# Patient Record
Sex: Female | Born: 1970 | Race: Black or African American | Hispanic: No | Marital: Married | Smoking: Never smoker
Health system: Southern US, Community
[De-identification: ages and names within clinical notes are randomized; demographics above are authoritative.]

## PROBLEM LIST (undated history)

## (undated) DIAGNOSIS — I10 Essential (primary) hypertension: Secondary | ICD-10-CM

## (undated) DIAGNOSIS — T7840XA Allergy, unspecified, initial encounter: Secondary | ICD-10-CM

## (undated) DIAGNOSIS — F419 Anxiety disorder, unspecified: Secondary | ICD-10-CM

## (undated) DIAGNOSIS — R7303 Prediabetes: Secondary | ICD-10-CM

## (undated) DIAGNOSIS — IMO0001 Reserved for inherently not codable concepts without codable children: Secondary | ICD-10-CM

## (undated) DIAGNOSIS — J189 Pneumonia, unspecified organism: Secondary | ICD-10-CM

## (undated) DIAGNOSIS — R519 Headache, unspecified: Secondary | ICD-10-CM

## (undated) DIAGNOSIS — E785 Hyperlipidemia, unspecified: Secondary | ICD-10-CM

## (undated) DIAGNOSIS — L509 Urticaria, unspecified: Secondary | ICD-10-CM

## (undated) DIAGNOSIS — J45909 Unspecified asthma, uncomplicated: Secondary | ICD-10-CM

## (undated) DIAGNOSIS — C50919 Malignant neoplasm of unspecified site of unspecified female breast: Secondary | ICD-10-CM

## (undated) DIAGNOSIS — N921 Excessive and frequent menstruation with irregular cycle: Secondary | ICD-10-CM

## (undated) DIAGNOSIS — Z8042 Family history of malignant neoplasm of prostate: Secondary | ICD-10-CM

## (undated) DIAGNOSIS — R51 Headache: Secondary | ICD-10-CM

## (undated) DIAGNOSIS — D259 Leiomyoma of uterus, unspecified: Secondary | ICD-10-CM

## (undated) HISTORY — PX: BREAST BIOPSY: SHX20

## (undated) HISTORY — PX: UTERINE FIBROID SURGERY: SHX826

## (undated) HISTORY — DX: Urticaria, unspecified: L50.9

## (undated) HISTORY — DX: Family history of malignant neoplasm of prostate: Z80.42

## (undated) HISTORY — DX: Excessive and frequent menstruation with irregular cycle: N92.1

## (undated) HISTORY — DX: Leiomyoma of uterus, unspecified: D25.9

## (undated) HISTORY — DX: Malignant neoplasm of unspecified site of unspecified female breast: C50.919

## (undated) HISTORY — PX: DILATION AND CURETTAGE OF UTERUS: SHX78

## (undated) HISTORY — DX: Allergy, unspecified, initial encounter: T78.40XA

## (undated) HISTORY — PX: GANGLION CYST EXCISION: SHX1691

---

## 2005-03-25 ENCOUNTER — Emergency Department: Payer: Self-pay | Admitting: Emergency Medicine

## 2005-05-20 ENCOUNTER — Emergency Department: Payer: Self-pay | Admitting: Emergency Medicine

## 2008-10-22 ENCOUNTER — Emergency Department: Payer: Self-pay | Admitting: Emergency Medicine

## 2011-01-22 ENCOUNTER — Ambulatory Visit: Payer: Self-pay | Admitting: Obstetrics and Gynecology

## 2011-01-25 ENCOUNTER — Ambulatory Visit: Payer: Self-pay | Admitting: Obstetrics and Gynecology

## 2011-02-06 ENCOUNTER — Ambulatory Visit: Payer: Self-pay | Admitting: Family Medicine

## 2011-05-02 ENCOUNTER — Ambulatory Visit: Payer: Self-pay | Admitting: Obstetrics and Gynecology

## 2011-05-09 ENCOUNTER — Inpatient Hospital Stay: Payer: Self-pay | Admitting: Obstetrics and Gynecology

## 2011-05-09 LAB — POTASSIUM: Potassium: 3.9 mmol/L (ref 3.5–5.1)

## 2011-05-10 LAB — CBC
HCT: 26.1 % — ABNORMAL LOW (ref 35.0–47.0)
MCHC: 32.2 g/dL (ref 32.0–36.0)
RBC: 3.58 10*6/uL — ABNORMAL LOW (ref 3.80–5.20)
RDW: 19.4 % — ABNORMAL HIGH (ref 11.5–14.5)

## 2011-05-10 LAB — BASIC METABOLIC PANEL
BUN: 7 mg/dL (ref 7–18)
Calcium, Total: 8.2 mg/dL — ABNORMAL LOW (ref 8.5–10.1)
Co2: 27 mmol/L (ref 21–32)
EGFR (African American): >60
EGFR (Non-African Amer.): >60
Glucose: 123 mg/dL — ABNORMAL HIGH (ref 65–99)
Potassium: 3.3 mmol/L — ABNORMAL LOW (ref 3.5–5.1)
Sodium: 138 mmol/L (ref 136–145)

## 2011-05-11 LAB — COMPREHENSIVE METABOLIC PANEL
Albumin: 2.8 g/dL — ABNORMAL LOW (ref 3.4–5.0)
Anion Gap: 8 (ref 7–16)
BUN: 7 mg/dL (ref 7–18)
Calcium, Total: 8.2 mg/dL — ABNORMAL LOW (ref 8.5–10.1)
Co2: 27 mmol/L (ref 21–32)
EGFR (African American): >60
EGFR (Non-African Amer.): >60
Glucose: 98 mg/dL (ref 65–99)
Osmolality: 281 (ref 275–301)
Potassium: 3.7 mmol/L (ref 3.5–5.1)
SGOT(AST): 29 U/L (ref 15–37)
Sodium: 142 mmol/L (ref 136–145)

## 2011-05-11 LAB — CBC WITH DIFFERENTIAL/PLATELET
Basophil #: 0 10*3/uL (ref 0.0–0.1)
Basophil %: 0 %
Eosinophil #: 0.2 10*3/uL (ref 0.0–0.7)
Eosinophil %: 1.6 %
HGB: 7.8 g/dL — ABNORMAL LOW (ref 12.0–16.0)
Lymphocyte #: 2.1 10*3/uL (ref 1.0–3.6)
MCH: 23.7 pg — ABNORMAL LOW (ref 26.0–34.0)
MCV: 74 fL — ABNORMAL LOW (ref 80–100)
Monocyte #: 1 10*3/uL — ABNORMAL HIGH (ref 0.0–0.7)
Monocyte %: 6.9 %
Neutrophil %: 77.5 %
Platelet: 332 10*3/uL (ref 150–440)
RBC: 3.27 10*6/uL — ABNORMAL LOW (ref 3.80–5.20)
WBC: 14.7 10*3/uL — ABNORMAL HIGH (ref 3.6–11.0)

## 2011-05-11 LAB — CBC
HGB: 7.8 g/dL — ABNORMAL LOW (ref 12.0–16.0)
MCV: 73 fL — ABNORMAL LOW (ref 80–100)
Platelet: 320 10*3/uL (ref 150–440)
RBC: 3.29 10*6/uL — ABNORMAL LOW (ref 3.80–5.20)
WBC: 14 10*3/uL — ABNORMAL HIGH (ref 3.6–11.0)

## 2011-05-12 LAB — URINALYSIS, COMPLETE
Bilirubin,UR: NEGATIVE
Blood: NEGATIVE
Glucose,UR: NEGATIVE mg/dL (ref 0–75)
Ketone: NEGATIVE
Nitrite: NEGATIVE
RBC,UR: 1 /HPF (ref 0–5)
Specific Gravity: 1.006 (ref 1.003–1.030)
WBC UR: 1 /HPF (ref 0–5)

## 2011-05-12 LAB — CBC
HCT: 23.8 % — ABNORMAL LOW (ref 35.0–47.0)
MCHC: 32 g/dL (ref 32.0–36.0)
MCV: 73 fL — ABNORMAL LOW (ref 80–100)
Platelet: 337 10*3/uL (ref 150–440)
RBC: 3.26 10*6/uL — ABNORMAL LOW (ref 3.80–5.20)
RDW: 20 % — ABNORMAL HIGH (ref 11.5–14.5)
WBC: 12 10*3/uL — ABNORMAL HIGH (ref 3.6–11.0)

## 2011-05-12 LAB — BASIC METABOLIC PANEL
Anion Gap: 8 (ref 7–16)
Calcium, Total: 8.6 mg/dL (ref 8.5–10.1)
Chloride: 107 mmol/L (ref 98–107)
Co2: 27 mmol/L (ref 21–32)
Creatinine: 0.86 mg/dL (ref 0.60–1.30)
EGFR (African American): >60
Sodium: 142 mmol/L (ref 136–145)

## 2011-05-13 LAB — URINE CULTURE

## 2011-05-14 LAB — PATHOLOGY REPORT

## 2012-03-07 ENCOUNTER — Emergency Department: Payer: Self-pay | Admitting: Emergency Medicine

## 2012-11-19 ENCOUNTER — Ambulatory Visit: Payer: Self-pay | Admitting: Family Medicine

## 2014-08-24 ENCOUNTER — Emergency Department
Admit: 2014-08-24 | Disposition: A | Discharge status: Home / Self Care | Payer: Self-pay | Admitting: Emergency Medicine

## 2014-08-24 LAB — COMPREHENSIVE METABOLIC PANEL
Albumin: 4.3 g/dL
Alkaline Phosphatase: 42 U/L
Anion Gap: 11 (ref 7–16)
BILIRUBIN TOTAL: 0.5 mg/dL
BUN: 16 mg/dL
CREATININE: 0.99 mg/dL
Calcium, Total: 9.8 mg/dL
Chloride: 101 mmol/L
Co2: 27 mmol/L
EGFR (African American): >60
Glucose: 100 mg/dL — ABNORMAL HIGH
POTASSIUM: 3.3 mmol/L — AB
SGOT(AST): 25 U/L
SGPT (ALT): 21 U/L
SODIUM: 139 mmol/L
Total Protein: 8.2 g/dL — ABNORMAL HIGH

## 2014-08-24 LAB — CBC
HCT: 39.3 % (ref 35.0–47.0)
HGB: 12.9 g/dL (ref 12.0–16.0)
MCH: 27.3 pg (ref 26.0–34.0)
MCHC: 32.8 g/dL (ref 32.0–36.0)
MCV: 83 fL (ref 80–100)
Platelet: 425 10*3/uL (ref 150–440)
RBC: 4.73 10*6/uL (ref 3.80–5.20)
RDW: 14.6 % — ABNORMAL HIGH (ref 11.5–14.5)
WBC: 12.6 10*3/uL — ABNORMAL HIGH (ref 3.6–11.0)

## 2014-08-24 LAB — URINALYSIS, COMPLETE
Bacteria: NONE SEEN
Bilirubin,UR: NEGATIVE
GLUCOSE, UR: NEGATIVE mg/dL (ref 0–75)
Ketone: NEGATIVE
LEUKOCYTE ESTERASE: NEGATIVE
NITRITE: NEGATIVE
Ph: 5 (ref 4.5–8.0)
Protein: NEGATIVE
Specific Gravity: 1.009 (ref 1.003–1.030)

## 2014-08-24 LAB — PREGNANCY, URINE: Pregnancy Test, Urine: NEGATIVE m[IU]/mL

## 2014-08-24 LAB — LIPASE, BLOOD: Lipase: 29 U/L

## 2014-08-28 NOTE — Op Note (Signed)
PATIENT NAME:  Tracey Huff, Tracey Huff MR#:  [id number] DATE OF BIRTH:  04-Jun-1970  DATE OF PROCEDURE:  05/09/2011  PREOPERATIVE DIAGNOSIS:  1) Large leiomyomatous uterus extending to the level of the umbilicus.  2) Desires to preserve uterus for fertility  POSTOPERATIVE DIAGNOSIS:  1) Large leiomyomatous uterus extending to the level of the umbilicus.  2) Desires to preserve uterus for fertility   PROCEDURE: Abdominal myomectomy of multiple fibroids (six total).   SURGEON: Will Bonnet, MD  ASSISTANT SURGEON: Dr. Barnett Applebaum   ANESTHESIA: General.   ESTIMATED BLOOD LOSS: 100 mL.   OPERATIVE FLUIDS: 1800 mL crystalloid.   COMPLICATIONS: None.  FINDINGS:  1. Large fibroid uterus extending just to the level of the umbilicus with multiple fibroids, the largest in the posterior fundal region.  2. Multiple smaller fibroids that are scattered throughout the myometrium.  3. Largest fibroid did communicate with the endometrial cavity. 4.  Normal appearing fallopian tubes and ovaries.   SPECIMENS: Total of six leiomyomas in varying sizes.   CONDITION: Stable.   INDICATIONS: Tracey Huff is a 44 year old female gravida 2, para 2-0-0-2 who presented to my office with symptoms of heavy menstrual bleeding along with pelvic pressure. She also desired to retain her uterus for purposes of fertility. After investigation at least one or two very large fibroids are in the fundal region of her uterus. She was offered an opportunity for myomectomy with uterine sparing. She also was warned about the possibility of the need for hysterectomy given the size and location of the largest fibroid. With this in mind she agreed to go to the Operating Room for myomectomy, possible hysterectomy. She was pretreated with Depo Lupron for 3 months prior to this surgery in an attempt to decrease the size of the fibroids.   DETAILS OF THE PROCEDURE: The patient was met in the preoperative area and the details of the  surgery were reviewed with the patient. From there she proceeded to the Operating Room where she was placed under general anesthesia which was found to be adequate. She was placed in dorsal supine lithotomy position in the yellow-fin stirrups then prepped and draped in normal sterile fashion. After a Time-Out was called a Foley catheter was placed in her bladder.   Attention was turned to her abdomen where a Pfannenstiel incision was made through her existing scar and incision was carried down through the various layers until the abdominal cavity was entered without incident. No adhesions were noted at the site of entry. The abdomen was examined and the fundus of the uterus was able to be grasped and delivered through the incision. The uterus was then injected with a diluted solution of 20 units of vasopressin diluted in 100 mL of saline. A total of approximately 20 mL of this solution was injected into the myometrium of the uterus; 10 mL anteriorly, 10 mL posteriorly. Next, attention was turned to the fundus of the uterus where an anteroposterior type incision was made using the Bovie and carried down to the level of the fibroid. This incision was extended anteriorly and posteriorly and a plane was developed between the fibroid and the myometrium until eventually the fibroid was totally shelled out from the myometrium. Along the way multiple other smaller fibroids were noted and shelled out in a similar fashion. There was one right lateral posterior fibroid that was approximately 3 to 4 cm in size which was removed in a similar fashion. Noted was that entry into the endometrial cavity occured with  removal of the largest myoma; this was verified by using a uterine sound which was passed into the cavity through the cervix. There was a defect that had to be made in the endometrium in addition to the one from the fibroid. This was closed using a 3-0 Vicryl with a figure of eight as it was a small defect,  approximately 1 cm. The large defect left by the largest fibroid was then closed. Initially the endometrium was reapproximated using 3-0 Vicryl in a running locked fashion. The myometrium was closed in two layers using a #2 quill suture. The second half of the myometrial layer was closed using the second half of the #2 quill suture. Next a running suture of 2-0 Vicryl was used to reapproximate the edges of the serosa, the actual edges of the serosa were reapproximated using 3-0 Vicryl using a running baseball stitch. The same procedure was carried out where the smaller fibroid was removed from the right posterolateral side. Hemostasis was noted. Next an adhesion barrier was placed over the incision site. The rest of the uterus and the pelvis and the abdomen was explored with the above-noted findings. Hemostasis was noted. The peritoneum was closed in a running fashion using 2-0 Vicryl.   The On-Q pump system was then placed by introducing the introducer approximately 4 cm superior to the level of the incision approximately 1 cm lateral to the midline on each side. The introducers were advanced through the skin down below the fascia where they were just superficial to the rectus muscle. Next, the catheters themselves were passed through the introducers in the specified fashion and the sheaths were removed. Next, the fascia was closed using #0 Maxon in a running fashion. The skin was closed with staples. The catheters for the On-Q pump where then affixed to the skin at the entry site using Dermabond then covered with a 4 x 4 and Steri-Strips and Tegaderm. Both of the catheters were then injected with 0.5% Marcaine for a total of 10 mL. The On-Q pain pump was then attached and will provide a rate of 2 mL/h in each line for a total of 4 mL/h.   The patient tolerated the procedure. Sponge, LAP and needle counts were correct x2. Patient was given 2 grams of cefazolin prior to skin incision. For VTE prophylaxis patient  had SCDs on from before incision throughout the entire case.     ____________________________ Will Bonnet, MD sdj:cms D: 05/09/2011 16:42:17 ET T: 05/09/2011 17:11:07 ET JOB#: [id number]  cc: Will Bonnet, MD, <Dictator> Will Bonnet MD ELECTRONICALLY SIGNED 05/22/2011 23:10

## 2014-11-09 ENCOUNTER — Encounter
Admission: RE | Admit: 2014-11-09 | Discharge: 2014-11-09 | Disposition: A | Discharge status: Home / Self Care | Payer: 59 | Source: Ambulatory Visit | Attending: Obstetrics and Gynecology | Admitting: Obstetrics and Gynecology

## 2014-11-09 DIAGNOSIS — Z01812 Encounter for preprocedural laboratory examination: Secondary | ICD-10-CM | POA: Diagnosis present

## 2014-11-09 DIAGNOSIS — Z01810 Encounter for preprocedural cardiovascular examination: Secondary | ICD-10-CM | POA: Insufficient documentation

## 2014-11-09 HISTORY — DX: Reserved for inherently not codable concepts without codable children: IMO0001

## 2014-11-09 HISTORY — DX: Headache, unspecified: R51.9

## 2014-11-09 HISTORY — DX: Unspecified asthma, uncomplicated: J45.909

## 2014-11-09 HISTORY — DX: Headache: R51

## 2014-11-09 HISTORY — DX: Essential (primary) hypertension: I10

## 2014-11-09 LAB — COMPREHENSIVE METABOLIC PANEL
ALBUMIN: 4.1 g/dL (ref 3.5–5.0)
ALK PHOS: 41 U/L (ref 38–126)
ALT: 18 U/L (ref 14–54)
AST: 23 U/L (ref 15–41)
Anion gap: 8 (ref 5–15)
BILIRUBIN TOTAL: 0.4 mg/dL (ref 0.3–1.2)
BUN: 14 mg/dL (ref 6–20)
CHLORIDE: 100 mmol/L — AB (ref 101–111)
CO2: 26 mmol/L (ref 22–32)
Calcium: 9.2 mg/dL (ref 8.9–10.3)
Creatinine, Ser: 0.88 mg/dL (ref 0.44–1.00)
GFR calc Af Amer: >60 mL/min (ref >60)
GFR calc non Af Amer: >60 mL/min (ref >60)
Glucose, Bld: 96 mg/dL (ref 65–99)
Potassium: 3.6 mmol/L (ref 3.5–5.1)
Sodium: 134 mmol/L — ABNORMAL LOW (ref 135–145)
Total Protein: 8.1 g/dL (ref 6.5–8.1)

## 2014-11-09 LAB — TYPE AND SCREEN
ABO/RH(D): A POS
Antibody Screen: NEGATIVE

## 2014-11-09 LAB — CBC
HCT: 38.7 % (ref 35.0–47.0)
Hemoglobin: 12.6 g/dL (ref 12.0–16.0)
MCH: 27.5 pg (ref 26.0–34.0)
MCHC: 32.5 g/dL (ref 32.0–36.0)
MCV: 84.5 fL (ref 80.0–100.0)
PLATELETS: 395 10*3/uL (ref 150–440)
RBC: 4.58 MIL/uL (ref 3.80–5.20)
RDW: 14.2 % (ref 11.5–14.5)
WBC: 9.5 10*3/uL (ref 3.6–11.0)

## 2014-11-09 LAB — ABO/RH: ABO/RH(D): A POS

## 2014-11-09 NOTE — Patient Instructions (Signed)
  Your procedure is scheduled on: November 15, 2014 (Tuesday) Report to Day Surgery. To find out your arrival time please call [phone] between 1PM - 3PM on November 14 2014 (Monday).  Remember: Instructions that are not followed completely may result in serious medical risk, up to and including death, or upon the discretion of your surgeon and anesthesiologist your surgery may need to be rescheduled.    __x__ 1. Do not eat food or drink liquids after midnight. No gum chewing or hard candies.     __x__ 2. No Alcohol for 24 hours before or after surgery.   ____ 3. Bring all medications with you on the day of surgery if instructed.    __x__ 4. Notify your doctor if there is any change in your medical condition     (cold, fever, infections).     Do not wear jewelry, make-up, hairpins, clips or nail polish.  Do not wear lotions, powders, or perfumes. You may wear deodorant.  Do not shave 48 hours prior to surgery. Men may shave face and neck.  Do not bring valuables to the hospital.    [facility] is not responsible for any belongings or valuables.               Contacts, dentures or bridgework may not be worn into surgery.  Leave your suitcase in the car. After surgery it may be brought to your room.  For patients admitted to the hospital, discharge time is determined by your                treatment team.   Patients discharged the day of surgery will not be allowed to drive home.   Please read over the following fact sheets that you were given:   Surgical Site Infection Prevention   __x__ Take these medicines the morning of surgery with A SIP OF WATER:    1.      ____ Fleet Enema (as directed)   __x__ Use CHG Soap as directed  __x__ Use inhalers on the day of surgery (Qvar and  Albuterol inhaler  and bring to hospital) ____ Stop metformin 2 days prior to surgery    ____ Take 1/2 of usual insulin dose the night before surgery and none on the morning of surgery.   __x__ Stop  Coumadin/Plavix/aspirin on (Tylenol ok to take for pain)  ____ Stop Anti-inflammatories on   ____ Stop supplements until after surgery.    ____ Bring C-Pap to the hospital.

## 2014-11-15 ENCOUNTER — Encounter: Payer: Self-pay | Admitting: *Deleted

## 2014-11-15 ENCOUNTER — Encounter
Admission: RE | Disposition: A | Discharge status: Home / Self Care | Payer: Self-pay | Source: Ambulatory Visit | Attending: Obstetrics and Gynecology

## 2014-11-15 ENCOUNTER — Inpatient Hospital Stay: Payer: 59 | Admitting: Anesthesiology

## 2014-11-15 ENCOUNTER — Inpatient Hospital Stay
Admission: RE | Admit: 2014-11-15 | Discharge: 2014-11-17 | DRG: 743 | Disposition: A | Discharge status: Home / Self Care | Payer: 59 | Source: Ambulatory Visit | Attending: Obstetrics and Gynecology | Admitting: Obstetrics and Gynecology

## 2014-11-15 DIAGNOSIS — D259 Leiomyoma of uterus, unspecified: Secondary | ICD-10-CM

## 2014-11-15 DIAGNOSIS — Z7951 Long term (current) use of inhaled steroids: Secondary | ICD-10-CM

## 2014-11-15 DIAGNOSIS — N920 Excessive and frequent menstruation with regular cycle: Secondary | ICD-10-CM | POA: Diagnosis present

## 2014-11-15 DIAGNOSIS — I10 Essential (primary) hypertension: Secondary | ICD-10-CM | POA: Diagnosis present

## 2014-11-15 DIAGNOSIS — J45909 Unspecified asthma, uncomplicated: Secondary | ICD-10-CM | POA: Diagnosis present

## 2014-11-15 DIAGNOSIS — Z79899 Other long term (current) drug therapy: Secondary | ICD-10-CM | POA: Diagnosis not present

## 2014-11-15 DIAGNOSIS — N921 Excessive and frequent menstruation with irregular cycle: Secondary | ICD-10-CM

## 2014-11-15 DIAGNOSIS — Z90710 Acquired absence of both cervix and uterus: Secondary | ICD-10-CM | POA: Diagnosis present

## 2014-11-15 HISTORY — DX: Leiomyoma of uterus, unspecified: D25.9

## 2014-11-15 HISTORY — PX: CYSTOSCOPY: SHX5120

## 2014-11-15 HISTORY — PX: ABDOMINAL HYSTERECTOMY: SHX81

## 2014-11-15 HISTORY — DX: Excessive and frequent menstruation with irregular cycle: N92.1

## 2014-11-15 LAB — POCT PREGNANCY, URINE: Preg Test, Ur: NEGATIVE

## 2014-11-15 SURGERY — HYSTERECTOMY, ABDOMINAL
Anesthesia: General | Wound class: Clean Contaminated

## 2014-11-15 MED ORDER — ONDANSETRON HCL 4 MG/2ML IJ SOLN
4.0000 mg | Freq: Four times a day (QID) | INTRAMUSCULAR | Status: DC | PRN
  Administered 2014-11-15 – 2014-11-16 (×2): 4 mg via INTRAVENOUS
  Filled 2014-11-15 (×2): qty 2

## 2014-11-15 MED ORDER — BUPIVACAINE HCL (PF) 0.5 % IJ SOLN
INTRAMUSCULAR | Status: AC
  Filled 2014-11-15: qty 30

## 2014-11-15 MED ORDER — BUPIVACAINE HCL (PF) 0.5 % IJ SOLN
10.0000 mL | Freq: Once | INTRAMUSCULAR | Status: AC
  Administered 2014-11-15: 10 mL

## 2014-11-15 MED ORDER — PROPOFOL 10 MG/ML IV BOLUS
INTRAVENOUS | Status: DC | PRN
  Administered 2014-11-15: 170 mg via INTRAVENOUS

## 2014-11-15 MED ORDER — OXYCODONE HCL 5 MG PO TABS: 5.0000 mg | ORAL_TABLET | Freq: Once | ORAL | Status: DC | PRN

## 2014-11-15 MED ORDER — CEFAZOLIN SODIUM-DEXTROSE 2-3 GM-% IV SOLR
INTRAVENOUS | Status: AC
  Administered 2014-11-15: 2 g via INTRAVENOUS
  Filled 2014-11-15: qty 50

## 2014-11-15 MED ORDER — LIDOCAINE HCL (CARDIAC) 20 MG/ML IV SOLN
INTRAVENOUS | Status: DC | PRN
  Administered 2014-11-15: 60 mg via INTRAVENOUS

## 2014-11-15 MED ORDER — OXYCODONE HCL 5 MG PO TABS
10.0000 mg | ORAL_TABLET | Freq: Four times a day (QID) | ORAL | Status: DC | PRN
  Administered 2014-11-15 – 2014-11-16 (×2): 10 mg via ORAL
  Filled 2014-11-15 (×2): qty 2

## 2014-11-15 MED ORDER — ACETAMINOPHEN 500 MG PO TABS
1000.0000 mg | ORAL_TABLET | Freq: Four times a day (QID) | ORAL | Status: DC
  Administered 2014-11-15 – 2014-11-16 (×2): 1000 mg via ORAL
  Filled 2014-11-15 (×2): qty 2

## 2014-11-15 MED ORDER — LACTATED RINGERS IV SOLN
INTRAVENOUS | Status: DC
Start: 2014-11-15 — End: 2014-11-15
  Administered 2014-11-15: 125 mL/h via INTRAVENOUS
  Administered 2014-11-15: 15:00:00 via INTRAVENOUS

## 2014-11-15 MED ORDER — SUGAMMADEX SODIUM 200 MG/2ML IV SOLN
INTRAVENOUS | Status: DC | PRN
  Administered 2014-11-15: 150 mg via INTRAVENOUS

## 2014-11-15 MED ORDER — OXYCODONE HCL 5 MG PO TABS: 5.0000 mg | ORAL_TABLET | Freq: Four times a day (QID) | ORAL | Status: DC | PRN

## 2014-11-15 MED ORDER — OXYCODONE HCL 5 MG/5ML PO SOLN: 5.0000 mg | Freq: Once | ORAL | Status: DC | PRN

## 2014-11-15 MED ORDER — HYDROMORPHONE HCL 1 MG/ML IJ SOLN
1.0000 mg | INTRAMUSCULAR | Status: DC | PRN
  Administered 2014-11-15 (×2): 1 mg via INTRAVENOUS
  Filled 2014-11-15 (×2): qty 1

## 2014-11-15 MED ORDER — LACTATED RINGERS IV SOLN
INTRAVENOUS | Status: DC
  Administered 2014-11-15: 125 mL/h via INTRAVENOUS
  Administered 2014-11-16: 04:00:00 via INTRAVENOUS

## 2014-11-15 MED ORDER — SIMETHICONE 80 MG PO CHEW: 80.0000 mg | CHEWABLE_TABLET | Freq: Four times a day (QID) | ORAL | Status: DC | PRN

## 2014-11-15 MED ORDER — DEXAMETHASONE SODIUM PHOSPHATE 10 MG/ML IJ SOLN
INTRAMUSCULAR | Status: DC | PRN
  Administered 2014-11-15: 5 mg via INTRAVENOUS

## 2014-11-15 MED ORDER — FENTANYL CITRATE (PF) 100 MCG/2ML IJ SOLN
INTRAMUSCULAR | Status: AC
  Administered 2014-11-15: 25 ug via INTRAVENOUS
  Filled 2014-11-15: qty 2

## 2014-11-15 MED ORDER — FENTANYL CITRATE (PF) 100 MCG/2ML IJ SOLN
25.0000 ug | INTRAMUSCULAR | Status: AC | PRN
  Administered 2014-11-15 (×6): 25 ug via INTRAVENOUS

## 2014-11-15 MED ORDER — FENTANYL CITRATE (PF) 100 MCG/2ML IJ SOLN
INTRAMUSCULAR | Status: DC | PRN
Start: 2014-11-15 — End: 2014-11-15
  Administered 2014-11-15: 50 ug via INTRAVENOUS
  Administered 2014-11-15: 100 ug via INTRAVENOUS
  Administered 2014-11-15: 50 ug via INTRAVENOUS
  Administered 2014-11-15 (×2): 25 ug via INTRAVENOUS

## 2014-11-15 MED ORDER — ALBUTEROL SULFATE HFA 108 (90 BASE) MCG/ACT IN AERS: 1.0000 | INHALATION_SPRAY | Freq: Four times a day (QID) | RESPIRATORY_TRACT | Status: DC | PRN

## 2014-11-15 MED ORDER — BUPIVACAINE 0.25 % ON-Q PUMP DUAL CATH 400 ML: 400.0000 mL | INJECTION | Status: DC

## 2014-11-15 MED ORDER — MENTHOL 3 MG MT LOZG: 1.0000 | LOZENGE | OROMUCOSAL | Status: DC | PRN

## 2014-11-15 MED ORDER — PHENYLEPHRINE HCL 10 MG/ML IJ SOLN
INTRAMUSCULAR | Status: DC | PRN
  Administered 2014-11-15: 50 ug via INTRAVENOUS

## 2014-11-15 MED ORDER — CEFAZOLIN SODIUM-DEXTROSE 2-3 GM-% IV SOLR
2.0000 g | INTRAVENOUS | Status: AC
  Administered 2014-11-15: 2 g via INTRAVENOUS

## 2014-11-15 MED ORDER — DOCUSATE SODIUM 100 MG PO CAPS
100.0000 mg | ORAL_CAPSULE | Freq: Two times a day (BID) | ORAL | Status: DC
Start: 2014-11-15 — End: 2014-11-17
  Administered 2014-11-16 – 2014-11-17 (×4): 100 mg via ORAL
  Filled 2014-11-15 (×4): qty 1

## 2014-11-15 MED ORDER — FAMOTIDINE 20 MG PO TABS
20.0000 mg | ORAL_TABLET | Freq: Once | ORAL | Status: AC
  Administered 2014-11-15: 20 mg via ORAL

## 2014-11-15 MED ORDER — ALBUTEROL SULFATE (2.5 MG/3ML) 0.083% IN NEBU: 2.5000 mg | INHALATION_SOLUTION | Freq: Four times a day (QID) | RESPIRATORY_TRACT | Status: DC | PRN

## 2014-11-15 MED ORDER — MIDAZOLAM HCL 5 MG/5ML IJ SOLN
INTRAMUSCULAR | Status: DC | PRN
  Administered 2014-11-15: 2 mg via INTRAVENOUS

## 2014-11-15 MED ORDER — FAMOTIDINE 20 MG PO TABS
ORAL_TABLET | ORAL | Status: AC
  Administered 2014-11-15: 20 mg via ORAL
  Filled 2014-11-15: qty 1

## 2014-11-15 MED ORDER — ONDANSETRON HCL 4 MG PO TABS
4.0000 mg | ORAL_TABLET | Freq: Four times a day (QID) | ORAL | Status: DC | PRN
  Administered 2014-11-16 – 2014-11-17 (×2): 4 mg via ORAL
  Filled 2014-11-15 (×2): qty 1

## 2014-11-15 MED ORDER — IBUPROFEN 600 MG PO TABS
600.0000 mg | ORAL_TABLET | Freq: Four times a day (QID) | ORAL | Status: DC
  Administered 2014-11-15 – 2014-11-16 (×2): 600 mg via ORAL
  Filled 2014-11-15 (×2): qty 1

## 2014-11-15 MED ORDER — BUPIVACAINE 0.25 % ON-Q PUMP DUAL CATH 400 ML
INJECTION | Status: AC
  Filled 2014-11-15: qty 400

## 2014-11-15 MED ORDER — ONDANSETRON HCL 4 MG/2ML IJ SOLN
INTRAMUSCULAR | Status: DC | PRN
  Administered 2014-11-15: 4 mg via INTRAVENOUS

## 2014-11-15 MED ORDER — ROCURONIUM BROMIDE 100 MG/10ML IV SOLN
INTRAVENOUS | Status: DC | PRN
  Administered 2014-11-15 (×3): 10 mg via INTRAVENOUS
  Administered 2014-11-15: 30 mg via INTRAVENOUS

## 2014-11-15 SURGICAL SUPPLY — 51 items
BAG URO DRAIN 2000ML W/SPOUT (MISCELLANEOUS) ×2 IMPLANT
CANISTER SUCT 1200ML W/VALVE (MISCELLANEOUS) ×2 IMPLANT
CATH FOLEY 2WAY  5CC 16FR (CATHETERS) ×1
CATH KIT ON-Q SILVERSOAK 5IN (CATHETERS) ×4 IMPLANT
CATH ROBINSON RED A/P 16FR (CATHETERS) IMPLANT
CATH TRAY 16F METER LATEX (MISCELLANEOUS) ×2 IMPLANT
CATH URTH 16FR FL 2W BLN LF (CATHETERS) ×1 IMPLANT
DRAPE LAPAROTOMY 100X77 ABD (DRAPES) ×2 IMPLANT
DRAPE LAPAROTOMY TRNSV 106X77 (MISCELLANEOUS) ×2 IMPLANT
DRAPE LEGGINS SURG 28X43 STRL (DRAPES) ×2 IMPLANT
DRAPE UNDER BUTTOCK W/FLU (DRAPES) ×2 IMPLANT
DRAPE XRAY CASSETTE 23X24 (DRAPES) IMPLANT
DRSG TELFA 3X8 NADH (GAUZE/BANDAGES/DRESSINGS) ×2 IMPLANT
ELECT BLADE 6 FLAT ULTRCLN (ELECTRODE) IMPLANT
ELECT CAUTERY BLADE 6.4 (BLADE) ×2 IMPLANT
GAUZE SPONGE 4X4 12PLY STRL (GAUZE/BANDAGES/DRESSINGS) ×4 IMPLANT
GLOVE BIO SURGEON STRL SZ7 (GLOVE) ×4 IMPLANT
GLOVE BIO SURGEON STRL SZ8 (GLOVE) ×10 IMPLANT
GLOVE BIOGEL PI IND STRL 7.5 (GLOVE) ×5 IMPLANT
GLOVE BIOGEL PI INDICATOR 7.5 (GLOVE) ×5
GOWN STRL REUS W/ TWL LRG LVL3 (GOWN DISPOSABLE) ×4 IMPLANT
GOWN STRL REUS W/ TWL XL LVL3 (GOWN DISPOSABLE) ×1 IMPLANT
GOWN STRL REUS W/TWL LRG LVL3 (GOWN DISPOSABLE) ×4
GOWN STRL REUS W/TWL XL LVL3 (GOWN DISPOSABLE) ×1
JELLY LUB 2OZ STRL (MISCELLANEOUS) ×1
JELLY LUBE 2OZ STRL (MISCELLANEOUS) ×1 IMPLANT
KIT RM TURNOVER STRD PROC AR (KITS) ×2 IMPLANT
LIGASURE BLUNT 5MM 37CM (INSTRUMENTS) ×2 IMPLANT
NDL HPO THNWL 1X22GA REG BVL (NEEDLE) ×1 IMPLANT
NEEDLE SAFETY 22GX1 (NEEDLE) ×1
NS IRRIG 1000ML POUR BTL (IV SOLUTION) ×2 IMPLANT
PACK BASIN MAJOR ARMC (MISCELLANEOUS) ×2 IMPLANT
PACK CYSTO AR (MISCELLANEOUS) IMPLANT
PAD GROUND ADULT SPLIT (MISCELLANEOUS) ×2 IMPLANT
PAD OB MATERNITY 4.3X12.25 (PERSONAL CARE ITEMS) ×2 IMPLANT
PAD PREP 24X41 OB/GYN DISP (PERSONAL CARE ITEMS) ×2 IMPLANT
SET CYSTO W/LG BORE CLAMP LF (SET/KITS/TRAYS/PACK) IMPLANT
SPONGE LAP 18X18 5 PK (GAUZE/BANDAGES/DRESSINGS) ×4 IMPLANT
SPONGE XRAY 4X4 16PLY STRL (MISCELLANEOUS) ×2 IMPLANT
STAPLER SKIN PROX 35W (STAPLE) IMPLANT
STRIP CLOSURE SKIN 1/2X4 (GAUZE/BANDAGES/DRESSINGS) ×2 IMPLANT
SUT ETHIBOND 0 (SUTURE) IMPLANT
SUT MAXON ABS #0 GS21 30IN (SUTURE) ×4 IMPLANT
SUT VIC AB 0 CT1 27 (SUTURE) ×3
SUT VIC AB 0 CT1 27XCR 8 STRN (SUTURE) ×3 IMPLANT
SUT VIC AB 0 CT1 36 (SUTURE) ×2 IMPLANT
SUT VIC AB 4-0 PS2 18 (SUTURE) IMPLANT
SUT VICRYL PLUS ABS 0 54 (SUTURE) IMPLANT
SYR CONTROL 10ML (SYRINGE) ×2 IMPLANT
TRAY PREP VAG/GEN (MISCELLANEOUS) IMPLANT
WATER STERILE IRR 3000ML UROMA (IV SOLUTION) IMPLANT

## 2014-11-15 NOTE — H&P (Signed)
History and Physical Interval Note:  Tracey Huff  has presented today for surgery, with the diagnosis of ABNORMAL UTERINE BLEEDING and Uterine fibroids.  The various methods of treatment have been discussed with the patient and family. After consideration of risks, benefits and other options for treatment, the patient has consented to  Procedure(s): HYSTERECTOMY ABDOMINAL/BILATERAL SALPINGECTOMY  (N/A) CYSTOSCOPY (N/A) as a surgical intervention.  [name] history has been reviewed, patient examined, no change in status, stable for surgery.  I have reviewed the patient's chart and labs.  Questions were answered to the patient's satisfaction.    The patient does not take a beta blocker and one is not indicated for this surgery.  Will Bonnet, MD, [place] 11/15/2014 11:19 AM

## 2014-11-15 NOTE — Op Note (Signed)
Operative Report  Pre-Op Diagnosis:  1) menorrhagia with regular cycles  2) fibroid uterus  Post-Op Diagnosis:  1) menorrhagia with regular cycles 2) fibroid uterus  Procedures:  1. Total abdominal hysterectomy, bilateral salpingectomy  2. Cystoscopy   Primary Surgeon: Dr. Prentice Docker   Assistant Surgeon: Malachy Mood, M.D.  EBL: 200 ml   IVF: 1,100 mL   Urine output: 250 mL  Specimens: Uterus, bilateral tubes, cervix   Drains: Foley to gravity   Complications: None   Disposition: PACU   Condition: Stable   Findings:  1) uterus with several small fibroids noted 2) normal-appearing fallopian tubes bilaterally 3) normal-appearing ovaries 4) filmy adhesions of bladder to lower uterine segment, filmy adhesions of colon epiploica to the posterior lower uterine segment  Procedure Summary: The patient presented to the surgical admission suite, where consents were reviewed and confirmed. She was then taken to the operating room where a time-out performed. General endotracheal anesthesia was induced without difficulty. Patient was examined with findings as noted above.  The patient was then prepped and draped in the normal sterile fashion with her legs in the Yellow fin stirrups. . A Pfannenstiel skin incision was made 2cm above the pubic symphysis with a scalpel and carried through to the underlying layer of fascia. The fascia was scored in the midline and the incision extended laterally. The rectus muscles were separated in the midline and peritoneum identified and entered bluntly. The peritoneal incision was extended with electrocautery and gentle traction. The pelvis was inspected and the above findings noted. The round ligaments were suture ligated and transected bilaterally with 0.0 vicryl . The anterior and posterior leaves of the broad ligament were entered and a bladder flap was created with the electrocautery. The uteters were identified bilaterally. A window was then  created with the Bovie below the right uteroovarian, which was transected and doubly suture ligated with 0.0 vicryl. This was repeated on the left side. The bladder flap was further developed with sharp dissection. The Balfour with extender retractor was then assembled and the bowel packed away and retractors placed. The self-retaining retractor was placed in order to obtain better deep-pelvis visualization. The uterine vessels were skeletonized and curved clamps used to clamp the uterine arteries, which were suture ligated with 0 vicryl bilaterally. We then took successive bites with straight clamps down the cardinal ligament staying close to the cervix. We reached the external os of the cervix and clamped across with curved clamps. The tissue, which included the uterosacrals, was transected, suture ligated, and held bilaterally. The vagina had been entered on each side and the Jorgenson scissors were used to transect the remaining vaginal tissue. The cervix was removed and the vaginal epithelium was then closed with several figure-of-eights of 0.0  Vicryl sutures. The uterosacrals were then tied to the vaginal angle stitch.  The cuff was irrigated and good hemostasis noted. Patient then underwent cystoscopy which showed no bladder wall injury and bilateral ureteral efflux. The vaginal cuff and pedicles bilaterally were again inspected with good hemostasis noted. The peritoneum was reapproximated in a running fashion using 0-vicryl.   The On-Q catheter pumps were inserted in accordance with the manufacturer's recommendations. The catheters were inserted approximately 4cm cephelad to the incision line, approximately 1cm apart, straddling the midline. They were inserted to a depth of the 4th mark. They were positioned superficial to the rectus abdominus muscles and deep to the rectus fascia.   The fascia was closed with looped 1 PDS in a running  fashion. The On-Q catheters were bolused with 5 mL of 0.5%  marcaine plain for a total of 10 mL. The catheters were affixed to the skin with surgical skin glue, steri-strips, and tegaderm.   The subcutaneous tissue was irrigated and the skin was closed with 3-0 undyed vicryl in a subcuticular stitch. Dermabond was use to reinforce the skin closure  The patient tolerated the procedure well and was taken to the PACU in stable condition. Sponge, lap, needle, and instrument counts were correct x 2. She received Ancef 2 g IV within 1 hour of skin incision. She was wearing pneumatic compression stockings throughout the entire case for VTE prophylaxis.  Will Bonnet, MD, [place] 11/15/2014 3:33 PM

## 2014-11-15 NOTE — Anesthesia Preprocedure Evaluation (Addendum)
Anesthesia Evaluation  Patient identified by MRN, date of birth, ID band Patient awake    Reviewed: Allergy & Precautions, H&P , NPO status , Patient's Chart, lab work & pertinent test results, reviewed documented beta blocker date and time   Airway Mallampati: II  TM Distance: >3 FB Neck ROM: full    Dental no notable dental hx. (+) Teeth Intact   Pulmonary shortness of breath, asthma ,  breath sounds clear to auscultation  Pulmonary exam normal       Cardiovascular Exercise Tolerance: Good hypertension, - Past MI Normal cardiovascular examRhythm:regular Rate:Normal     Neuro/Psych  Headaches, negative neurological ROS  negative psych ROS   GI/Hepatic negative GI ROS, Neg liver ROS,   Endo/Other  negative endocrine ROS  Renal/GU negative Renal ROS  negative genitourinary   Musculoskeletal   Abdominal   Peds  Hematology negative hematology ROS (+)   Anesthesia Other Findings Past Medical History:   Asthma                                                       Shortness of breath dyspnea                                  Headache                                                     Hypertension                                                 Reproductive/Obstetrics negative OB ROS                            Anesthesia Physical Anesthesia Plan  ASA: III  Anesthesia Plan: General ETT   Post-op Pain Management:    Induction:   Airway Management Planned:   Additional Equipment:   Intra-op Plan:   Post-operative Plan:   Informed Consent: I have reviewed the patients History and Physical, chart, labs and discussed the procedure including the risks, benefits and alternatives for the proposed anesthesia with the patient or authorized representative who has indicated his/her understanding and acceptance.   Dental Advisory Given  Plan Discussed with: Anesthesiologist, CRNA and  Surgeon  Anesthesia Plan Comments:         Anesthesia Quick Evaluation

## 2014-11-15 NOTE — Anesthesia Procedure Notes (Signed)
Procedure Name: Intubation Date/Time: 11/15/2014 12:04 PM Performed by: Dionne Bucy Pre-anesthesia Checklist: Patient identified Patient Re-evaluated:Patient Re-evaluated prior to inductionOxygen Delivery Method: Circle system utilized Preoxygenation: Pre-oxygenation with 100% oxygen Intubation Type: IV induction Ventilation: Mask ventilation without difficulty Laryngoscope Size: Mac and 3 Grade View: Grade II Tube type: Oral Tube size: 7.0 mm Number of attempts: 1 Airway Equipment and Method: Stylet Placement Confirmation: positive ETCO2 and breath sounds checked- equal and bilateral Secured at: 21 cm Tube secured with: Tape Dental Injury: Teeth and Oropharynx as per pre-operative assessment

## 2014-11-15 NOTE — Transfer of Care (Signed)
Immediate Anesthesia Transfer of Care Note  Patient: Tracey Huff  Procedure(s) Performed: Procedure(s): HYSTERECTOMY ABDOMINAL/BILATERAL SALPINGECTOMY  (N/A) CYSTOSCOPY (N/A)  Patient Location: PACU  Anesthesia Type:General  Level of Consciousness: awake and patient cooperative  Airway & Oxygen Therapy: Patient Spontanous Breathing and Patient connected to face mask oxygen  Post-op Assessment: Report given to RN and Post -op Vital signs reviewed and stable  Post vital signs: Reviewed and stable  Last Vitals:  Filed Vitals:   11/15/14 1532  BP: 140/93  Pulse: 95  Temp: 37.4 C  Resp: 13    Complications: No apparent anesthesia complications

## 2014-11-15 NOTE — Anesthesia Postprocedure Evaluation (Signed)
  Anesthesia Post-op Note  Patient: Tracey Huff  Procedure(s) Performed: Procedure(s): HYSTERECTOMY ABDOMINAL/BILATERAL SALPINGECTOMY  (N/A) CYSTOSCOPY (N/A)  Anesthesia type:General ETT  Patient location: PACU  Post pain: Pain level controlled  Post assessment: Post-op Vital signs reviewed, Patient's Cardiovascular Status Stable, Respiratory Function Stable, Patent Airway and No signs of Nausea or vomiting  Post vital signs: Reviewed and stable  Last Vitals:  Filed Vitals:   11/15/14 1532  BP: 140/93  Pulse: 95  Temp: 37.4 C  Resp: 13    Level of consciousness: awake, alert  and patient cooperative  Complications: No apparent anesthesia complications

## 2014-11-16 ENCOUNTER — Encounter: Payer: Self-pay | Admitting: Obstetrics and Gynecology

## 2014-11-16 LAB — BASIC METABOLIC PANEL
Anion gap: 3 — ABNORMAL LOW (ref 5–15)
BUN: 12 mg/dL (ref 6–20)
CO2: 25 mmol/L (ref 22–32)
CREATININE: 0.77 mg/dL (ref 0.44–1.00)
Calcium: 8.3 mg/dL — ABNORMAL LOW (ref 8.9–10.3)
Chloride: 109 mmol/L (ref 101–111)
GLUCOSE: 109 mg/dL — AB (ref 65–99)
POTASSIUM: 3.7 mmol/L (ref 3.5–5.1)
Sodium: 137 mmol/L (ref 135–145)

## 2014-11-16 LAB — CBC
HCT: 29.7 % — ABNORMAL LOW (ref 35.0–47.0)
HEMOGLOBIN: 9.8 g/dL — AB (ref 12.0–16.0)
MCH: 27.6 pg (ref 26.0–34.0)
MCHC: 32.8 g/dL (ref 32.0–36.0)
MCV: 84 fL (ref 80.0–100.0)
Platelets: 315 10*3/uL (ref 150–440)
RBC: 3.54 MIL/uL — ABNORMAL LOW (ref 3.80–5.20)
RDW: 13.8 % (ref 11.5–14.5)
WBC: 14.4 10*3/uL — AB (ref 3.6–11.0)

## 2014-11-16 MED ORDER — ACETAMINOPHEN 500 MG PO TABS
1000.0000 mg | ORAL_TABLET | Freq: Four times a day (QID) | ORAL | Status: DC
  Administered 2014-11-16 – 2014-11-17 (×4): 1000 mg via ORAL
  Filled 2014-11-16 (×5): qty 2

## 2014-11-16 MED ORDER — IBUPROFEN 600 MG PO TABS
600.0000 mg | ORAL_TABLET | Freq: Four times a day (QID) | ORAL | Status: DC
  Administered 2014-11-16 – 2014-11-17 (×6): 600 mg via ORAL
  Filled 2014-11-16 (×6): qty 1

## 2014-11-16 NOTE — Progress Notes (Signed)
Daily Post-Operative Progress Note  Post-Op Procedure(s) (LRB): HYSTERECTOMY ABDOMINAL/BILATERAL SALPINGECTOMY  (N/A) CYSTOSCOPY (N/A), postop day 1  Subjective: Patient reports she is tolerating po with liquids and not much solid food yet. She has passed flatus. Her pain is controlled well on oral medications. She has not ambulated or voided as her catheter is still in place.  Denies chest pain and trouble breathing.  Objective: BP 101/57 mmHg  Pulse 72  Temp(Src) 98.1 F (36.7 C) (Oral)  Resp 20  Ht 5' (1.524 m)  Wt 170 lb (77.111 kg)  BMI 33.20 kg/m2  SpO2 100%  LMP 11/15/2014 (Exact Date)   Gen: NAD Pulm: CTAB CV: RRR ABD: Soft, nontender, +BS, non-distended Inc: clean/dry/intact, OnQ pump in place Ext: SCDs in place   Recent Labs Lab 11/09/14 1124 11/16/14 0504 11/16/14 0616  WBC 9.5  --  14.4*  HGB 12.6  --  9.8*  HCT 38.7  --  29.7*  PLT 395  --  315  CREATININE 0.88 0.77  --   K 3.6 3.7  --     Assessment: POD#1  s/p Procedure(s): HYSTERECTOMY ABDOMINAL/BILATERAL SALPINGECTOMY  (N/A) CYSTOSCOPY (N/A): progressing well  Plan: Advance diet Encourage ambulation Advance to PO medication Discontinue IV fluids discontinue foley  LOS: 1 day   Anticipate discharge tomorrow or next day   Will Bonnet, MD, [facility] 11/16/2014 8:44 AM

## 2014-11-17 LAB — CBC
HEMATOCRIT: 29.9 % — AB (ref 35.0–47.0)
Hemoglobin: 9.8 g/dL — ABNORMAL LOW (ref 12.0–16.0)
MCH: 27.3 pg (ref 26.0–34.0)
MCHC: 32.7 g/dL (ref 32.0–36.0)
MCV: 83.6 fL (ref 80.0–100.0)
Platelets: 292 10*3/uL (ref 150–440)
RBC: 3.58 MIL/uL — ABNORMAL LOW (ref 3.80–5.20)
RDW: 14.2 % (ref 11.5–14.5)
WBC: 11.6 10*3/uL — ABNORMAL HIGH (ref 3.6–11.0)

## 2014-11-17 LAB — BASIC METABOLIC PANEL
Anion gap: 7 (ref 5–15)
BUN: 11 mg/dL (ref 6–20)
CHLORIDE: 107 mmol/L (ref 101–111)
CO2: 25 mmol/L (ref 22–32)
CREATININE: 0.86 mg/dL (ref 0.44–1.00)
Calcium: 8 mg/dL — ABNORMAL LOW (ref 8.9–10.3)
Glucose, Bld: 99 mg/dL (ref 65–99)
POTASSIUM: 3.2 mmol/L — AB (ref 3.5–5.1)
SODIUM: 139 mmol/L (ref 135–145)

## 2014-11-17 LAB — SURGICAL PATHOLOGY

## 2014-11-17 MED ORDER — IBUPROFEN 600 MG PO TABS: 600.0000 mg | ORAL_TABLET | Freq: Four times a day (QID) | ORAL | Status: DC

## 2014-11-17 MED ORDER — ACETAMINOPHEN 500 MG PO TABS: 1000.0000 mg | ORAL_TABLET | Freq: Four times a day (QID) | ORAL | Status: DC

## 2014-11-17 MED ORDER — OXYCODONE HCL 5 MG PO TABS: 5.0000 mg | ORAL_TABLET | Freq: Four times a day (QID) | ORAL | Status: DC | PRN

## 2014-11-17 MED ORDER — ONDANSETRON 4 MG PO TBDP
4.0000 mg | ORAL_TABLET | Freq: Three times a day (TID) | ORAL | Status: DC | PRN
Start: 2014-11-17 — End: 2016-10-14

## 2014-11-17 NOTE — Discharge Summary (Signed)
DC Summary Discharge Summary   Patient ID: Tracey Huff [id number] 44 y.o. 1970/08/03  Admit date: 11/15/2014  Discharge date: 11/17/2014  Principal Diagnoses:  1) menorrhagia with regular cycle 2) fibroid uterus  Secondary Diagnoses:  1) menorrhagia with regular cycle 2) fibroid uterus  Procedures performed during the hospitalization:  Total abdominal hysterectomy and bilateral salpingectomy  HPI: The patient is a 44 year old female who presented to the office with heavy vaginal bleeding and fibroids found on ultrasound. She underwent an abdominal myomectomy in 2013 for multiple fibroids. Because of her ongoing bleeding and discomfort issues that she strongly desired hysterectomy. She was therefore taken to the operating room for the above.  Past Medical History  Diagnosis Date  . Asthma   . Shortness of breath dyspnea   . Headache   . Hypertension     Past Surgical History  Procedure Laterality Date  . Cesarean section    . Uterine fibroid surgery    . Ganglion cyst excision Left   . Dilation and curettage of uterus    . Abdominal hysterectomy N/A 11/15/2014    Procedure: HYSTERECTOMY ABDOMINAL/BILATERAL SALPINGECTOMY ;  Surgeon: Will Bonnet, MD;  Location: [facility] ORS;  Service: Gynecology;  Laterality: N/A;  . Cystoscopy N/A 11/15/2014    Procedure: CYSTOSCOPY;  Surgeon: Will Bonnet, MD;  Location: [facility] ORS;  Service: Gynecology;  Laterality: N/A;    Allergies  Allergen Reactions  . Macadamia Nut Oil Shortness Of Breath  . Apple Nausea And Vomiting  . Fruit & Vegetable Daily [Nutritional Supplements] Nausea And Vomiting    Cannot tolerate apples, peaches, plums, and nectarines  . Kiwi Extract Nausea And Vomiting  . Peach [Prunus Persica] Nausea And Vomiting    History  Substance Use Topics  . Smoking status: Never Smoker   . Smokeless tobacco: Never Used  . Alcohol Use: Yes     Comment: occ    Family History  Problem Relation Age of Onset   . Congestive Heart Failure Mother   . Diabetes Mother   . Hypertension Mother     Hospital Course:  The patient was taken to the operating room on 11/15/2014 for the above-noted procedure, which occurred without incident. She was monitored in the hospital for 2 days to progress to meeting goals to be a vertical home. By postoperative day 2 she was meeting goals of ambulating, tolerating an oral diet, controlling pain with oral pain pain medication, and voiding spontaneously. Her vital signs were stable and her blood work was stable. She was therefore considered to be safe for discharge  Discharge Exam: BP 122/74 mmHg  Pulse 74  Temp(Src) 98 F (36.7 C) (Oral)  Resp 20  Ht 5' (1.524 m)  Wt 170 lb (77.111 kg)  BMI 33.20 kg/m2  SpO2 100%  LMP 11/15/2014 (Exact Date) General  no apparent distress   CV  RRR   Pulmonary  clear to ausculatation bllaterally   Abdomen  Bowel sounds: present  Incision: clean, dry, intact   Extremities  no edema, symmetric, SCDs in place    Condition at Discharge: Stable  Complications affecting treatment: None  Discharge Medications:    Medication List    TAKE these medications        acetaminophen 500 MG tablet  Commonly known as:  TYLENOL  Take 2 tablets (1,000 mg total) by mouth every 6 (six) hours.     albuterol 108 (90 BASE) MCG/ACT inhaler  Commonly known as:  PROVENTIL HFA;VENTOLIN HFA  Inhale  1-2 puffs into the lungs every 6 (six) hours as needed for wheezing or shortness of breath.     beclomethasone 80 MCG/ACT inhaler  Commonly known as:  QVAR  Inhale 2 puffs into the lungs as needed.     cholecalciferol 1000 UNITS tablet  Commonly known as:  VITAMIN D  Take 1,000 Units by mouth daily.     hydrochlorothiazide 25 MG tablet  Commonly known as:  HYDRODIURIL  Take 25 mg by mouth daily.     ibuprofen 600 MG tablet  Commonly known as:  ADVIL,MOTRIN  Take 1 tablet (600 mg total) by mouth every 6 (six) hours.     ondansetron 4 MG  disintegrating tablet  Commonly known as:  ZOFRAN ODT  Take 1 tablet (4 mg total) by mouth every 8 (eight) hours as needed for nausea or vomiting.     oxyCODONE 5 MG immediate release tablet  Commonly known as:  Oxy IR/ROXICODONE  Take 1 tablet (5 mg total) by mouth every 6 (six) hours as needed for moderate pain or severe pain.     potassium chloride SA 20 MEQ tablet  Commonly known as:  K-DUR,KLOR-CON  Take 20 mEq by mouth daily.        Follow-up arrangements:  Follow up in 1 week for an incision check with Dr. Prentice Docker at [facility]   Discharge Disposition: Home in stable condition  Signed: Will Bonnet, MD, [facility] 11/17/2014 12:53 PM

## 2015-08-01 DIAGNOSIS — J3089 Other allergic rhinitis: Secondary | ICD-10-CM | POA: Diagnosis not present

## 2015-08-03 DIAGNOSIS — J4530 Mild persistent asthma, uncomplicated: Secondary | ICD-10-CM | POA: Diagnosis not present

## 2015-08-03 DIAGNOSIS — J309 Allergic rhinitis, unspecified: Secondary | ICD-10-CM | POA: Diagnosis not present

## 2015-08-03 DIAGNOSIS — R7309 Other abnormal glucose: Secondary | ICD-10-CM | POA: Diagnosis not present

## 2015-10-09 ENCOUNTER — Ambulatory Visit (HOSPITAL_COMMUNITY)
Admission: EM | Admit: 2015-10-09 | Discharge: 2015-10-09 | Disposition: A | Discharge status: Home / Self Care | Payer: 59 | Attending: Family Medicine | Admitting: Family Medicine

## 2015-10-09 ENCOUNTER — Encounter (HOSPITAL_COMMUNITY): Payer: Self-pay | Admitting: *Deleted

## 2015-10-09 DIAGNOSIS — N390 Urinary tract infection, site not specified: Secondary | ICD-10-CM | POA: Diagnosis not present

## 2015-10-09 LAB — POCT URINALYSIS DIP (DEVICE)
Bilirubin Urine: NEGATIVE
Glucose, UA: NEGATIVE mg/dL
Nitrite: POSITIVE — AB
PH: 5.5 (ref 5.0–8.0)
PROTEIN: 30 mg/dL — AB
SPECIFIC GRAVITY, URINE: 1.02 (ref 1.005–1.030)
Urobilinogen, UA: 0.2 mg/dL (ref 0.0–1.0)

## 2015-10-09 MED ORDER — CEPHALEXIN 500 MG PO CAPS: 500.0000 mg | ORAL_CAPSULE | Freq: Four times a day (QID) | ORAL | Status: DC

## 2015-10-09 NOTE — ED Notes (Signed)
Pt   Reports frequency  Burning     And  painfull  Urination     X 1  Day    Pt  Has  Taken an  Azo  Prior  To  Coming  To  The  Clinic    Pt    Is   In no  Acute /  Severe  Distress

## 2015-10-09 NOTE — ED Provider Notes (Signed)
CSN: [id number]     Arrival date & time 10/09/15  1938 History   First MD Initiated Contact with Patient 10/09/15 1950     Chief Complaint  Patient presents with  . Urinary Tract Infection   (Consider location/radiation/quality/duration/timing/severity/associated sxs/prior Treatment) Patient is a 45 y.o. female presenting with urinary tract infection. The history is provided by the patient.  Urinary Tract Infection Pain quality:  Burning Pain severity:  Mild Onset quality:  Gradual Duration:  2 days Progression:  Worsening Chronicity:  New Recent urinary tract infections: no   Worsened by:  Nothing tried Ineffective treatments:  None tried Urinary symptoms: foul-smelling urine and frequent urination   Associated symptoms: abdominal pain   Associated symptoms: no fever, no flank pain, no nausea, no vaginal discharge and no vomiting     Past Medical History  Diagnosis Date  . Asthma   . Shortness of breath dyspnea   . Headache   . Hypertension    Past Surgical History  Procedure Laterality Date  . Cesarean section    . Uterine fibroid surgery    . Ganglion cyst excision Left   . Dilation and curettage of uterus    . Abdominal hysterectomy N/A 11/15/2014    Procedure: HYSTERECTOMY ABDOMINAL/BILATERAL SALPINGECTOMY ;  Surgeon: Will Bonnet, MD;  Location: [facility] ORS;  Service: Gynecology;  Laterality: N/A;  . Cystoscopy N/A 11/15/2014    Procedure: CYSTOSCOPY;  Surgeon: Will Bonnet, MD;  Location: [facility] OR[facility];  Service: Gynecology;  Laterality: N/A;   Family History  Problem Relation Age of Onset  . Congestive Heart Failure Mother   . Diabetes Mother   . Hypertension Mother    Social History  Substance Use Topics  . Smoking status: Never Smoker   . Smokeless tobacco: Never Used  . Alcohol Use: Yes     Comment: occ   OB History    No data available     Review of Systems  Constitutional: Negative.  Negative for fever.  Cardiovascular: Negative.    Gastrointestinal: Positive for abdominal pain. Negative for nausea and vomiting.  Genitourinary: Positive for dysuria, urgency and frequency. Negative for flank pain and vaginal discharge.  Musculoskeletal: Negative.   All other systems reviewed and are negative.   Allergies  Macadamia nut oil; Apple; Fruit & vegetable daily; Kiwi extract; and Peach  Home Medications   Prior to Admission medications   Medication Sig Start Date End Date Taking? Authorizing Provider  acetaminophen (TYLENOL) 500 MG tablet Take 2 tablets (1,000 mg total) by mouth every 6 (six) hours. 11/17/14   Will Bonnet, MD  albuterol (PROVENTIL HFA;VENTOLIN HFA) 108 (90 BASE) MCG/ACT inhaler Inhale 1-2 puffs into the lungs every 6 (six) hours as needed for wheezing or shortness of breath.    Historical Provider, MD  beclomethasone (QVAR) 80 MCG/ACT inhaler Inhale 2 puffs into the lungs as needed.    Historical Provider, MD  cephALEXin (KEFLEX) 500 MG capsule Take 1 capsule (500 mg total) by mouth 4 (four) times daily. Take all of medicine and drink lots of fluids 10/09/15   Billy Fischer, MD  cholecalciferol (VITAMIN D) 1000 UNITS tablet Take 1,000 Units by mouth daily.    Historical Provider, MD  hydrochlorothiazide (HYDRODIURIL) 25 MG tablet Take 25 mg by mouth daily.    Historical Provider, MD  ibuprofen (ADVIL,MOTRIN) 600 MG tablet Take 1 tablet (600 mg total) by mouth every 6 (six) hours. 11/17/14   Will Bonnet, MD  ondansetron (ZOFRAN ODT)  4 MG disintegrating tablet Take 1 tablet (4 mg total) by mouth every 8 (eight) hours as needed for nausea or vomiting. 11/17/14   Will Bonnet, MD  oxyCODONE (OXY IR/ROXICODONE) 5 MG immediate release tablet Take 1 tablet (5 mg total) by mouth every 6 (six) hours as needed for moderate pain or severe pain. 11/17/14   Will Bonnet, MD  potassium chloride SA (K-DUR,KLOR-CON) 20 MEQ tablet Take 20 mEq by mouth daily.    Historical Provider, MD   Meds Ordered and  Administered this Visit  Medications - No data to display  BP 124/72 mmHg  Pulse 78  Temp(Src) 98.6 F (37 C) (Oral)  Resp 18  SpO2 100%  LMP 10/10/2014 (Exact Date) No data found.   Physical Exam  Constitutional: She is oriented to person, place, and time. She appears well-developed and well-nourished. No distress.  Abdominal: Soft. Bowel sounds are normal. There is tenderness in the suprapubic area. There is no rigidity and no guarding.  Neurological: She is alert and oriented to person, place, and time.  Skin: Skin is warm and dry.  Nursing note and vitals reviewed.   ED Course  Procedures (including critical care time)  Labs Review Labs Reviewed  POCT URINALYSIS DIP (DEVICE) - Abnormal; Notable for the following:    Ketones, ur TRACE (*)    Hgb urine dipstick MODERATE (*)    Protein, ur 30 (*)    Nitrite POSITIVE (*)    Leukocytes, UA SMALL (*)    All other components within normal limits    Imaging Review No results found.   Visual Acuity Review  Right Eye Distance:   Left Eye Distance:   Bilateral Distance:    Right Eye Near:   Left Eye Near:    Bilateral Near:         MDM   1. UTI (lower urinary tract infection)        Billy Fischer, MD 10/09/15 2020

## 2016-03-01 DIAGNOSIS — E785 Hyperlipidemia, unspecified: Secondary | ICD-10-CM | POA: Diagnosis not present

## 2016-03-01 DIAGNOSIS — E876 Hypokalemia: Secondary | ICD-10-CM | POA: Diagnosis not present

## 2016-03-01 DIAGNOSIS — I10 Essential (primary) hypertension: Secondary | ICD-10-CM | POA: Diagnosis not present

## 2016-03-01 DIAGNOSIS — E559 Vitamin D deficiency, unspecified: Secondary | ICD-10-CM | POA: Diagnosis not present

## 2016-03-01 DIAGNOSIS — E119 Type 2 diabetes mellitus without complications: Secondary | ICD-10-CM | POA: Diagnosis not present

## 2016-08-27 DIAGNOSIS — Z23 Encounter for immunization: Secondary | ICD-10-CM | POA: Diagnosis not present

## 2016-08-27 DIAGNOSIS — E785 Hyperlipidemia, unspecified: Secondary | ICD-10-CM | POA: Diagnosis not present

## 2016-08-27 DIAGNOSIS — R7309 Other abnormal glucose: Secondary | ICD-10-CM | POA: Diagnosis not present

## 2016-08-27 DIAGNOSIS — Z1389 Encounter for screening for other disorder: Secondary | ICD-10-CM | POA: Diagnosis not present

## 2016-08-27 DIAGNOSIS — J4530 Mild persistent asthma, uncomplicated: Secondary | ICD-10-CM | POA: Diagnosis not present

## 2016-08-27 DIAGNOSIS — E119 Type 2 diabetes mellitus without complications: Secondary | ICD-10-CM | POA: Diagnosis not present

## 2016-08-27 DIAGNOSIS — I10 Essential (primary) hypertension: Secondary | ICD-10-CM | POA: Diagnosis not present

## 2016-08-27 DIAGNOSIS — E559 Vitamin D deficiency, unspecified: Secondary | ICD-10-CM | POA: Diagnosis not present

## 2016-08-27 MED FILL — OLOPATADINE HCL 0.2% EYE DR: 0.2 | 25 days supply | Qty: 3 | Fill #0

## 2016-08-27 MED FILL — VENTOLIN HFA 90 MCG INHALER: 108 (90 BAS | 25 days supply | Qty: 18 | Fill #0

## 2016-08-27 MED FILL — QVAR REDIHALER 40 MCG/ACT A: 40 | 30 days supply | Qty: 11 | Fill #0

## 2016-08-27 MED FILL — ATORVASTATIN 20 MG TABLET: 20 | 30 days supply | Qty: 30 | Fill #0

## 2016-08-29 ENCOUNTER — Other Ambulatory Visit: Payer: Self-pay | Admitting: *Deleted

## 2016-08-29 ENCOUNTER — Other Ambulatory Visit: Payer: Self-pay | Admitting: Family Medicine

## 2016-08-29 DIAGNOSIS — Z1231 Encounter for screening mammogram for malignant neoplasm of breast: Secondary | ICD-10-CM

## 2016-08-29 MED FILL — VIT D2 1.25 MG (50,000 UNIT: 1.25 MG | 28 days supply | Qty: 4 | Fill #0

## 2016-09-12 DIAGNOSIS — J3089 Other allergic rhinitis: Secondary | ICD-10-CM | POA: Diagnosis not present

## 2016-09-12 DIAGNOSIS — J301 Allergic rhinitis due to pollen: Secondary | ICD-10-CM | POA: Diagnosis not present

## 2016-09-12 DIAGNOSIS — J4530 Mild persistent asthma, uncomplicated: Secondary | ICD-10-CM | POA: Diagnosis not present

## 2016-09-12 DIAGNOSIS — J309 Allergic rhinitis, unspecified: Secondary | ICD-10-CM | POA: Diagnosis not present

## 2016-09-12 MED FILL — predniSONE 10 MG TABS: 10 | 4 days supply | Qty: 10 | Fill #0

## 2016-09-12 MED FILL — MONTELUKAST SOD 10 MG TAB: 10 | 30 days supply | Qty: 30 | Fill #0

## 2016-09-25 DIAGNOSIS — J301 Allergic rhinitis due to pollen: Secondary | ICD-10-CM | POA: Diagnosis not present

## 2016-09-26 DIAGNOSIS — J3089 Other allergic rhinitis: Secondary | ICD-10-CM | POA: Diagnosis not present

## 2016-09-26 DIAGNOSIS — J3081 Allergic rhinitis due to animal (cat) (dog) hair and dander: Secondary | ICD-10-CM | POA: Diagnosis not present

## 2016-10-04 MED FILL — VIT D2 1.25 MG (50,000 UNIT: 1.25 MG | 28 days supply | Qty: 4 | Fill #1

## 2016-10-11 DIAGNOSIS — J301 Allergic rhinitis due to pollen: Secondary | ICD-10-CM | POA: Diagnosis not present

## 2016-10-11 DIAGNOSIS — J3081 Allergic rhinitis due to animal (cat) (dog) hair and dander: Secondary | ICD-10-CM | POA: Diagnosis not present

## 2016-10-11 DIAGNOSIS — J3089 Other allergic rhinitis: Secondary | ICD-10-CM | POA: Diagnosis not present

## 2016-10-14 ENCOUNTER — Ambulatory Visit (HOSPITAL_COMMUNITY)
Admission: EM | Admit: 2016-10-14 | Discharge: 2016-10-14 | Disposition: A | Discharge status: Home / Self Care | Payer: 59 | Attending: Internal Medicine | Admitting: Internal Medicine

## 2016-10-14 DIAGNOSIS — H6983 Other specified disorders of Eustachian tube, bilateral: Secondary | ICD-10-CM

## 2016-10-14 DIAGNOSIS — R0982 Postnasal drip: Secondary | ICD-10-CM

## 2016-10-14 DIAGNOSIS — H9201 Otalgia, right ear: Secondary | ICD-10-CM | POA: Diagnosis not present

## 2016-10-14 DIAGNOSIS — J029 Acute pharyngitis, unspecified: Secondary | ICD-10-CM | POA: Diagnosis not present

## 2016-10-14 DIAGNOSIS — T700XXA Otitic barotrauma, initial encounter: Secondary | ICD-10-CM

## 2016-10-14 NOTE — ED Triage Notes (Signed)
The patient presented to the [facility] with a complaint of a sore throat and right ear pain that started this am.

## 2016-10-14 NOTE — Discharge Instructions (Signed)
The ear pain is due to the inability of your eustachian tubes to equalize the pressure in her ear. The sore throat is likely due to the excessive amount of drainage. Continue taking Xyzal. You may want to add another antihistamine such as Chlor-Trimeton 2 or 4 mg every 4 hours as needed for drainage. It can cause drowsiness. Sometimes it is best to take before bedtime. Drink plenty of water especially before going to bed and upon getting up in the morning. Cepacol lozenges to help with sore throat pain. Ibuprofen for pain as needed.

## 2016-10-14 NOTE — ED Provider Notes (Signed)
CSN: [id number]     Arrival date & time 10/14/16  1020 History   First MD Initiated Contact with Patient 10/14/16 1142     Chief Complaint  Patient presents with  . Otalgia  . Sore Throat   (Consider location/radiation/quality/duration/timing/severity/associated sxs/prior Treatment) 46 year old atopic female with multiple environmental allergies including some foods. She awoke this morning around 5 AM with right earache and sore throat with done aphasia. She has a history of near daily PND. No fevers or chills. Currently seeing an allergist and has received a couple of years of desensitization injections and stopped for a while after improvement but now going back to having those injections. She is also using her albuterol and a steroid inhaler. She is not complaining of shortness of breath. Currently she is taking Xyzal for the antihistamine.      Past Medical History:  Diagnosis Date  . Asthma   . Headache   . Hypertension   . Shortness of breath dyspnea    Past Surgical History:  Procedure Laterality Date  . ABDOMINAL HYSTERECTOMY N/A 11/15/2014   Procedure: HYSTERECTOMY ABDOMINAL/BILATERAL SALPINGECTOMY ;  Surgeon: Will Bonnet, MD;  Location: [facility] OR[facility];  Service: Gynecology;  Laterality: N/A;  . CESAREAN SECTION    . CYSTOSCOPY N/A 11/15/2014   Procedure: CYSTOSCOPY;  Surgeon: Will Bonnet, MD;  Location: [facility] OR[facility];  Service: Gynecology;  Laterality: N/A;  . DILATION AND CURETTAGE OF UTERUS    . GANGLION CYST EXCISION Left   . UTERINE FIBROID SURGERY     Family History  Problem Relation Age of Onset  . Congestive Heart Failure Mother   . Diabetes Mother   . Hypertension Mother    Social History  Substance Use Topics  . Smoking status: Never Smoker  . Smokeless tobacco: Never Used  . Alcohol use Yes     Comment: occ   OB History    No data available     Review of Systems  Constitutional: Negative for activity change, appetite change, chills, fatigue and  fever.  HENT: Positive for congestion, ear pain, postnasal drip, rhinorrhea and sore throat. Negative for facial swelling and hearing loss.   Eyes: Negative.   Respiratory: Positive for cough.   Cardiovascular: Negative.   Musculoskeletal: Negative for neck pain and neck stiffness.  Skin: Negative for pallor and rash.  Neurological: Negative.   All other systems reviewed and are negative.   Allergies  Macadamia nut oil; Apple; Fruit & vegetable daily [nutritional supplements]; Kiwi extract; and Peach [prunus persica]  Home Medications   Prior to Admission medications   Medication Sig Start Date End Date Taking? Authorizing Provider  albuterol (PROVENTIL HFA;VENTOLIN HFA) 108 (90 BASE) MCG/ACT inhaler Inhale 1-2 puffs into the lungs every 6 (six) hours as needed for wheezing or shortness of breath.   Yes [provider]  beclomethasone (QVAR) 80 MCG/ACT inhaler Inhale 2 puffs into the lungs as needed.   Yes [provider]  hydrochlorothiazide (HYDRODIURIL) 25 MG tablet Take 25 mg by mouth daily.   Yes [provider]  levocetirizine (XYZAL) 5 MG tablet Take 5 mg by mouth every evening.   Yes [provider]   Meds Ordered and Administered this Visit  Medications - No data to display  BP 131/81 (BP Location: Right Arm)   Pulse 84   Temp 98.6 F (37 C) (Oral)   Resp 18   LMP 11/15/2014 (Exact Date)   SpO2 99%  No data found.   Physical Exam  Constitutional: She is oriented to person, place, and time. She appears well-developed and well-nourished. No distress.  HENT:  Bilateral TMs are moderately retracted but without erythema or effusion. No exudates.  Unable to visualize oropharynx due to patient's hypersensitive gag reflex and inability to control her tongue for visualization.  Eyes: EOM are normal.  Neck: Normal range of motion. Neck supple.  Cardiovascular: Normal rate, regular rhythm and normal heart sounds.   Pulmonary/Chest: Effort  normal and breath sounds normal. No respiratory distress. She has no wheezes. She has no rales.  Musculoskeletal: Normal range of motion. She exhibits no edema.  Lymphadenopathy:    She has no cervical adenopathy.  Neurological: She is alert and oriented to person, place, and time.  Skin: Skin is warm and dry. No rash noted.  Psychiatric: She has a normal mood and affect. Her behavior is normal. Thought content normal.  Nursing note and vitals reviewed.   Urgent Care Course     Procedures (including critical care time)  Labs Review Labs Reviewed - No data to display  Imaging Review No results found.   Visual Acuity Review  Right Eye Distance:   Left Eye Distance:   Bilateral Distance:    Right Eye Near:   Left Eye Near:    Bilateral Near:         MDM   1. Right ear pain   2. ETD (Eustachian tube dysfunction), bilateral   3. Barotitis media, initial encounter   4. PND (post-nasal drip)   5. Sore throat    The ear pain is due to the inability of your eustachian tubes to equalize the pressure in her ear. The sore throat is likely due to the excessive amount of drainage. Continue taking Xyzal. You may want to add another antihistamine such as Chlor-Trimeton 2 or 4 mg every 4 hours as needed for drainage. It can cause drowsiness. Sometimes it is best to take before bedtime. Drink plenty of water especially before going to bed and upon getting up in the morning. Cepacol lozenges to help with sore throat pain. Ibuprofen for pain as needed.    Janne Napoleon, NP 10/14/16 1205

## 2016-10-15 DIAGNOSIS — J301 Allergic rhinitis due to pollen: Secondary | ICD-10-CM | POA: Diagnosis not present

## 2016-10-15 DIAGNOSIS — J3081 Allergic rhinitis due to animal (cat) (dog) hair and dander: Secondary | ICD-10-CM | POA: Diagnosis not present

## 2016-10-15 DIAGNOSIS — J3089 Other allergic rhinitis: Secondary | ICD-10-CM | POA: Diagnosis not present

## 2016-10-18 DIAGNOSIS — J3089 Other allergic rhinitis: Secondary | ICD-10-CM | POA: Diagnosis not present

## 2016-10-18 DIAGNOSIS — J3081 Allergic rhinitis due to animal (cat) (dog) hair and dander: Secondary | ICD-10-CM | POA: Diagnosis not present

## 2016-10-18 DIAGNOSIS — J301 Allergic rhinitis due to pollen: Secondary | ICD-10-CM | POA: Diagnosis not present

## 2016-10-22 DIAGNOSIS — J301 Allergic rhinitis due to pollen: Secondary | ICD-10-CM | POA: Diagnosis not present

## 2016-10-22 DIAGNOSIS — J3081 Allergic rhinitis due to animal (cat) (dog) hair and dander: Secondary | ICD-10-CM | POA: Diagnosis not present

## 2016-10-22 DIAGNOSIS — J3089 Other allergic rhinitis: Secondary | ICD-10-CM | POA: Diagnosis not present

## 2016-10-25 DIAGNOSIS — J3081 Allergic rhinitis due to animal (cat) (dog) hair and dander: Secondary | ICD-10-CM | POA: Diagnosis not present

## 2016-10-25 DIAGNOSIS — J3089 Other allergic rhinitis: Secondary | ICD-10-CM | POA: Diagnosis not present

## 2016-10-25 DIAGNOSIS — J301 Allergic rhinitis due to pollen: Secondary | ICD-10-CM | POA: Diagnosis not present

## 2016-10-29 DIAGNOSIS — J3089 Other allergic rhinitis: Secondary | ICD-10-CM | POA: Diagnosis not present

## 2016-10-29 DIAGNOSIS — J301 Allergic rhinitis due to pollen: Secondary | ICD-10-CM | POA: Diagnosis not present

## 2016-10-29 DIAGNOSIS — J3081 Allergic rhinitis due to animal (cat) (dog) hair and dander: Secondary | ICD-10-CM | POA: Diagnosis not present

## 2016-11-01 DIAGNOSIS — J3081 Allergic rhinitis due to animal (cat) (dog) hair and dander: Secondary | ICD-10-CM | POA: Diagnosis not present

## 2016-11-01 DIAGNOSIS — J301 Allergic rhinitis due to pollen: Secondary | ICD-10-CM | POA: Diagnosis not present

## 2016-11-01 DIAGNOSIS — J3089 Other allergic rhinitis: Secondary | ICD-10-CM | POA: Diagnosis not present

## 2016-11-05 DIAGNOSIS — J3081 Allergic rhinitis due to animal (cat) (dog) hair and dander: Secondary | ICD-10-CM | POA: Diagnosis not present

## 2016-11-05 DIAGNOSIS — J3089 Other allergic rhinitis: Secondary | ICD-10-CM | POA: Diagnosis not present

## 2016-11-05 DIAGNOSIS — J301 Allergic rhinitis due to pollen: Secondary | ICD-10-CM | POA: Diagnosis not present

## 2016-11-07 DIAGNOSIS — J3081 Allergic rhinitis due to animal (cat) (dog) hair and dander: Secondary | ICD-10-CM | POA: Diagnosis not present

## 2016-11-07 DIAGNOSIS — J3089 Other allergic rhinitis: Secondary | ICD-10-CM | POA: Diagnosis not present

## 2016-11-07 DIAGNOSIS — J301 Allergic rhinitis due to pollen: Secondary | ICD-10-CM | POA: Diagnosis not present

## 2016-11-12 DIAGNOSIS — J3081 Allergic rhinitis due to animal (cat) (dog) hair and dander: Secondary | ICD-10-CM | POA: Diagnosis not present

## 2016-11-12 DIAGNOSIS — J301 Allergic rhinitis due to pollen: Secondary | ICD-10-CM | POA: Diagnosis not present

## 2016-11-12 DIAGNOSIS — J3089 Other allergic rhinitis: Secondary | ICD-10-CM | POA: Diagnosis not present

## 2016-11-14 DIAGNOSIS — J3081 Allergic rhinitis due to animal (cat) (dog) hair and dander: Secondary | ICD-10-CM | POA: Diagnosis not present

## 2016-11-14 DIAGNOSIS — J3089 Other allergic rhinitis: Secondary | ICD-10-CM | POA: Diagnosis not present

## 2016-11-14 DIAGNOSIS — J301 Allergic rhinitis due to pollen: Secondary | ICD-10-CM | POA: Diagnosis not present

## 2016-11-19 DIAGNOSIS — J3089 Other allergic rhinitis: Secondary | ICD-10-CM | POA: Diagnosis not present

## 2016-11-19 DIAGNOSIS — J301 Allergic rhinitis due to pollen: Secondary | ICD-10-CM | POA: Diagnosis not present

## 2016-11-19 DIAGNOSIS — J3081 Allergic rhinitis due to animal (cat) (dog) hair and dander: Secondary | ICD-10-CM | POA: Diagnosis not present

## 2016-11-21 DIAGNOSIS — J3081 Allergic rhinitis due to animal (cat) (dog) hair and dander: Secondary | ICD-10-CM | POA: Diagnosis not present

## 2016-11-21 DIAGNOSIS — J3089 Other allergic rhinitis: Secondary | ICD-10-CM | POA: Diagnosis not present

## 2016-11-21 DIAGNOSIS — J301 Allergic rhinitis due to pollen: Secondary | ICD-10-CM | POA: Diagnosis not present

## 2016-11-26 DIAGNOSIS — J301 Allergic rhinitis due to pollen: Secondary | ICD-10-CM | POA: Diagnosis not present

## 2016-11-26 DIAGNOSIS — J3089 Other allergic rhinitis: Secondary | ICD-10-CM | POA: Diagnosis not present

## 2016-11-26 DIAGNOSIS — J3081 Allergic rhinitis due to animal (cat) (dog) hair and dander: Secondary | ICD-10-CM | POA: Diagnosis not present

## 2016-11-28 DIAGNOSIS — J3081 Allergic rhinitis due to animal (cat) (dog) hair and dander: Secondary | ICD-10-CM | POA: Diagnosis not present

## 2016-11-28 DIAGNOSIS — J301 Allergic rhinitis due to pollen: Secondary | ICD-10-CM | POA: Diagnosis not present

## 2016-11-28 DIAGNOSIS — J3089 Other allergic rhinitis: Secondary | ICD-10-CM | POA: Diagnosis not present

## 2016-12-03 DIAGNOSIS — J3089 Other allergic rhinitis: Secondary | ICD-10-CM | POA: Diagnosis not present

## 2016-12-03 DIAGNOSIS — J3081 Allergic rhinitis due to animal (cat) (dog) hair and dander: Secondary | ICD-10-CM | POA: Diagnosis not present

## 2016-12-03 DIAGNOSIS — J301 Allergic rhinitis due to pollen: Secondary | ICD-10-CM | POA: Diagnosis not present

## 2016-12-06 DIAGNOSIS — J3089 Other allergic rhinitis: Secondary | ICD-10-CM | POA: Diagnosis not present

## 2016-12-06 DIAGNOSIS — J301 Allergic rhinitis due to pollen: Secondary | ICD-10-CM | POA: Diagnosis not present

## 2016-12-06 DIAGNOSIS — J3081 Allergic rhinitis due to animal (cat) (dog) hair and dander: Secondary | ICD-10-CM | POA: Diagnosis not present

## 2016-12-10 DIAGNOSIS — J3081 Allergic rhinitis due to animal (cat) (dog) hair and dander: Secondary | ICD-10-CM | POA: Diagnosis not present

## 2016-12-10 DIAGNOSIS — J301 Allergic rhinitis due to pollen: Secondary | ICD-10-CM | POA: Diagnosis not present

## 2016-12-10 DIAGNOSIS — J3089 Other allergic rhinitis: Secondary | ICD-10-CM | POA: Diagnosis not present

## 2016-12-12 DIAGNOSIS — J3081 Allergic rhinitis due to animal (cat) (dog) hair and dander: Secondary | ICD-10-CM | POA: Diagnosis not present

## 2016-12-12 DIAGNOSIS — J3089 Other allergic rhinitis: Secondary | ICD-10-CM | POA: Diagnosis not present

## 2016-12-12 DIAGNOSIS — J301 Allergic rhinitis due to pollen: Secondary | ICD-10-CM | POA: Diagnosis not present

## 2016-12-17 DIAGNOSIS — J3081 Allergic rhinitis due to animal (cat) (dog) hair and dander: Secondary | ICD-10-CM | POA: Diagnosis not present

## 2016-12-17 DIAGNOSIS — J3089 Other allergic rhinitis: Secondary | ICD-10-CM | POA: Diagnosis not present

## 2016-12-17 DIAGNOSIS — J301 Allergic rhinitis due to pollen: Secondary | ICD-10-CM | POA: Diagnosis not present

## 2016-12-20 DIAGNOSIS — J3089 Other allergic rhinitis: Secondary | ICD-10-CM | POA: Diagnosis not present

## 2016-12-20 DIAGNOSIS — J301 Allergic rhinitis due to pollen: Secondary | ICD-10-CM | POA: Diagnosis not present

## 2016-12-24 DIAGNOSIS — J3081 Allergic rhinitis due to animal (cat) (dog) hair and dander: Secondary | ICD-10-CM | POA: Diagnosis not present

## 2016-12-24 DIAGNOSIS — J301 Allergic rhinitis due to pollen: Secondary | ICD-10-CM | POA: Diagnosis not present

## 2016-12-24 DIAGNOSIS — J3089 Other allergic rhinitis: Secondary | ICD-10-CM | POA: Diagnosis not present

## 2016-12-31 DIAGNOSIS — J3081 Allergic rhinitis due to animal (cat) (dog) hair and dander: Secondary | ICD-10-CM | POA: Diagnosis not present

## 2016-12-31 DIAGNOSIS — J3089 Other allergic rhinitis: Secondary | ICD-10-CM | POA: Diagnosis not present

## 2016-12-31 DIAGNOSIS — J301 Allergic rhinitis due to pollen: Secondary | ICD-10-CM | POA: Diagnosis not present

## 2017-01-03 DIAGNOSIS — J301 Allergic rhinitis due to pollen: Secondary | ICD-10-CM | POA: Diagnosis not present

## 2017-01-03 DIAGNOSIS — J3081 Allergic rhinitis due to animal (cat) (dog) hair and dander: Secondary | ICD-10-CM | POA: Diagnosis not present

## 2017-01-03 DIAGNOSIS — J3089 Other allergic rhinitis: Secondary | ICD-10-CM | POA: Diagnosis not present

## 2017-01-07 ENCOUNTER — Emergency Department
Admission: EM | Admit: 2017-01-07 | Discharge: 2017-01-07 | Disposition: A | Discharge status: Home / Self Care | Payer: 59 | Attending: Emergency Medicine | Admitting: Emergency Medicine

## 2017-01-07 DIAGNOSIS — I10 Essential (primary) hypertension: Secondary | ICD-10-CM | POA: Diagnosis not present

## 2017-01-07 DIAGNOSIS — J3089 Other allergic rhinitis: Secondary | ICD-10-CM | POA: Diagnosis not present

## 2017-01-07 DIAGNOSIS — L299 Pruritus, unspecified: Secondary | ICD-10-CM | POA: Insufficient documentation

## 2017-01-07 DIAGNOSIS — J45909 Unspecified asthma, uncomplicated: Secondary | ICD-10-CM | POA: Insufficient documentation

## 2017-01-07 DIAGNOSIS — J301 Allergic rhinitis due to pollen: Secondary | ICD-10-CM | POA: Diagnosis not present

## 2017-01-07 DIAGNOSIS — J3081 Allergic rhinitis due to animal (cat) (dog) hair and dander: Secondary | ICD-10-CM | POA: Diagnosis not present

## 2017-01-07 DIAGNOSIS — L509 Urticaria, unspecified: Secondary | ICD-10-CM | POA: Diagnosis present

## 2017-01-07 DIAGNOSIS — T7840XA Allergy, unspecified, initial encounter: Secondary | ICD-10-CM | POA: Insufficient documentation

## 2017-01-07 DIAGNOSIS — Z79899 Other long term (current) drug therapy: Secondary | ICD-10-CM | POA: Diagnosis not present

## 2017-01-07 MED ORDER — PREDNISONE 20 MG PO TABS: 40.0000 mg | ORAL_TABLET | Freq: Every day | ORAL | 0 refills | Status: DC

## 2017-01-07 MED ORDER — PREDNISONE 20 MG PO TABS
60.0000 mg | ORAL_TABLET | Freq: Once | ORAL | Status: AC
  Administered 2017-01-07: 60 mg via ORAL
  Filled 2017-01-07: qty 3

## 2017-01-07 NOTE — ED Provider Notes (Signed)
[facility] Emergency Department Provider Note  ____________________________________________   First MD Initiated Contact with Patient 01/07/17 1713     (approximate)  I have reviewed the triage vital signs and the nursing notes.   HISTORY  Chief Complaint Allergic Reaction   HPI Tracey Huff is a 46 y.o. female with a history of hypertension as well as multiple food allergies who is presenting after receiving an allergy sensitization shots this afternoon. She received a shot about 3 PM and then began feeling itchy as well as noticing hives especially under her arms bilaterally. She said that she also had a tingling sensation to her tongue. She then took a 25 mg Benadryl tablet about half an hour ago and says that the itching has subsided as well as the tongue tingling and now she just feels "jittery." She says that she had her first dose of a higher strength sensitization shot this afternoon which she thinks may have precipitated the symptoms.   Past Medical History:  Diagnosis Date  . Asthma   . Headache   . Hypertension   . Shortness of breath dyspnea     Patient Active Problem List   Diagnosis Date Noted  . Menorrhagia with irregular cycle 11/15/2014  . Fibroid uterus 11/15/2014  . Status post hysterectomy 11/15/2014    Past Surgical History:  Procedure Laterality Date  . ABDOMINAL HYSTERECTOMY N/A 11/15/2014   Procedure: HYSTERECTOMY ABDOMINAL/BILATERAL SALPINGECTOMY ;  Surgeon: Will Bonnet, MD;  Location: [facility] OR[facility];  Service: Gynecology;  Laterality: N/A;  . CESAREAN SECTION    . CYSTOSCOPY N/A 11/15/2014   Procedure: CYSTOSCOPY;  Surgeon: Will Bonnet, MD;  Location: [facility] ORS;  Service: Gynecology;  Laterality: N/A;  . DILATION AND CURETTAGE OF UTERUS    . GANGLION CYST EXCISION Left   . UTERINE FIBROID SURGERY      Prior to Admission medications   Medication Sig Start Date End Date Taking? Authorizing Provider  albuterol  (PROVENTIL HFA;VENTOLIN HFA) 108 (90 BASE) MCG/ACT inhaler Inhale 1-2 puffs into the lungs every 6 (six) hours as needed for wheezing or shortness of breath.    [provider]  beclomethasone (QVAR) 80 MCG/ACT inhaler Inhale 2 puffs into the lungs as needed.    [provider]  hydrochlorothiazide (HYDRODIURIL) 25 MG tablet Take 25 mg by mouth daily.    [provider]  levocetirizine (XYZAL) 5 MG tablet Take 5 mg by mouth every evening.    [provider]    Allergies Macadamia nut oil; Apple; Fruit & vegetable daily [nutritional supplements]; Kiwi extract; and Peach [prunus persica]  Family History  Problem Relation Age of Onset  . Congestive Heart Failure Mother   . Diabetes Mother   . Hypertension Mother     Social History Social History  Substance Use Topics  . Smoking status: Never Smoker  . Smokeless tobacco: Never Used  . Alcohol use Yes     Comment: occ    Review of Systems  Constitutional: No fever/chills Eyes: No visual changes. ENT: No sore throat. Cardiovascular: Denies chest pain. Respiratory: Denies shortness of breath. Gastrointestinal: No abdominal pain.  No nausea, no vomiting.  No diarrhea.  No constipation. Genitourinary: Negative for dysuria. Musculoskeletal: Negative for back pain. Skin: as above Neurological: Negative for headaches, focal weakness or numbness.   ____________________________________________   PHYSICAL EXAM:  VITAL SIGNS: ED Triage Vitals [01/07/17 1703]  Enc Vitals Group     BP (!) 165/74  Pulse Rate (!) 102     Resp 18     Temp 98.2 F (36.8 C)     Temp Source Oral     SpO2 100 %     Weight 172 lb (78 kg)     Height 5' (1.524 m)     Head Circumference      Peak Flow      Pain Score      Pain Loc      Pain Edu?      Excl. in [place]?     Constitutional: Alert and oriented. Well appearing and in no acute distress. Eyes: Conjunctivae are normal.  Head: Atraumatic. Nose: No  congestion/rhinnorhea. Mouth/Throat: Mucous membranes are moist. No tongue swelling. Patient speaking in a normal voice. Controlling her secretions. Neck: No stridor.   Cardiovascular: Normal rate, regular rhythm. Grossly normal heart sounds.   Respiratory: Normal respiratory effort.  No retractions. Lungs CTAB. Gastrointestinal: Soft and nontender. No distention. Musculoskeletal: No lower extremity tenderness nor edema.  No joint effusions. Neurologic:  Normal speech and language. No gross focal neurologic deficits are appreciated. Skin:  Skin is warm, dry and intact. No rash noted. Psychiatric: Mood and affect are normal. Speech and behavior are normal.  ____________________________________________   LABS (all labs ordered are listed, but only abnormal results are displayed)  Labs Reviewed - No data to display ____________________________________________  EKG   ____________________________________________  RADIOLOGY   ____________________________________________   PROCEDURES  Procedure(s) performed:   Procedures  Critical Care performed:   ____________________________________________   INITIAL IMPRESSION / ASSESSMENT AND PLAN / ED COURSE  Pertinent labs & imaging results that were available during my care of the patient were reviewed by me and considered in my medical decision making (see chart for details).  ----------------------------------------- 7:01 PM on 01/07/2017 -----------------------------------------  Patient is symptomatic at this time. Does not feel her tongue swollen. No rash. She has not eaten at home. She'll be discharged with 40 mg of prednisone over the next 6 days, daily. She is understanding the plan and willing to comply. She'll be following up with her allergist.      ____________________________________________   FINAL CLINICAL IMPRESSION(S) / ED DIAGNOSES  Allergic reaction.    NEW MEDICATIONS STARTED DURING THIS  VISIT:  New Prescriptions   No medications on file     Note:  This document was prepared using Dragon voice recognition software and may include unintentional dictation errors.     Orbie Pyo, MD 01/07/17 [place] Hare

## 2017-01-07 NOTE — ED Notes (Signed)
Patient up to room commode with a steady gait.

## 2017-01-07 NOTE — ED Triage Notes (Addendum)
Pt states she got an allergy injection today at 3pm and since is having hives with itching and feeling like her tongue is itching with feeling jittery and nauseous.. States she took benadryl 25mg 

## 2017-01-09 DIAGNOSIS — J3089 Other allergic rhinitis: Secondary | ICD-10-CM | POA: Diagnosis not present

## 2017-01-09 DIAGNOSIS — J301 Allergic rhinitis due to pollen: Secondary | ICD-10-CM | POA: Diagnosis not present

## 2017-01-09 DIAGNOSIS — H1045 Other chronic allergic conjunctivitis: Secondary | ICD-10-CM | POA: Diagnosis not present

## 2017-01-09 DIAGNOSIS — J4530 Mild persistent asthma, uncomplicated: Secondary | ICD-10-CM | POA: Diagnosis not present

## 2017-01-16 DIAGNOSIS — J3081 Allergic rhinitis due to animal (cat) (dog) hair and dander: Secondary | ICD-10-CM | POA: Diagnosis not present

## 2017-01-16 DIAGNOSIS — J3089 Other allergic rhinitis: Secondary | ICD-10-CM | POA: Diagnosis not present

## 2017-01-16 DIAGNOSIS — J301 Allergic rhinitis due to pollen: Secondary | ICD-10-CM | POA: Diagnosis not present

## 2017-01-23 DIAGNOSIS — J3089 Other allergic rhinitis: Secondary | ICD-10-CM | POA: Diagnosis not present

## 2017-01-23 DIAGNOSIS — J3081 Allergic rhinitis due to animal (cat) (dog) hair and dander: Secondary | ICD-10-CM | POA: Diagnosis not present

## 2017-01-23 DIAGNOSIS — J301 Allergic rhinitis due to pollen: Secondary | ICD-10-CM | POA: Diagnosis not present

## 2017-01-30 DIAGNOSIS — J3081 Allergic rhinitis due to animal (cat) (dog) hair and dander: Secondary | ICD-10-CM | POA: Diagnosis not present

## 2017-01-30 DIAGNOSIS — J301 Allergic rhinitis due to pollen: Secondary | ICD-10-CM | POA: Diagnosis not present

## 2017-01-30 DIAGNOSIS — J3089 Other allergic rhinitis: Secondary | ICD-10-CM | POA: Diagnosis not present

## 2017-02-06 DIAGNOSIS — J3081 Allergic rhinitis due to animal (cat) (dog) hair and dander: Secondary | ICD-10-CM | POA: Diagnosis not present

## 2017-02-06 DIAGNOSIS — J3089 Other allergic rhinitis: Secondary | ICD-10-CM | POA: Diagnosis not present

## 2017-02-06 DIAGNOSIS — J301 Allergic rhinitis due to pollen: Secondary | ICD-10-CM | POA: Diagnosis not present

## 2017-02-06 MED FILL — MONTELUKAST SOD 10 MG TAB: 10 | 30 days supply | Qty: 30 | Fill #1

## 2017-02-06 MED FILL — VIT D2 1.25 MG (50,000 UNIT: 1.25 MG | 28 days supply | Qty: 4 | Fill #2

## 2017-02-12 MED FILL — LISINOPRIL 5 MG TABLET: 5 | 90 days supply | Qty: 90 | Fill #0

## 2017-02-13 DIAGNOSIS — J301 Allergic rhinitis due to pollen: Secondary | ICD-10-CM | POA: Diagnosis not present

## 2017-02-13 DIAGNOSIS — J3081 Allergic rhinitis due to animal (cat) (dog) hair and dander: Secondary | ICD-10-CM | POA: Diagnosis not present

## 2017-02-13 DIAGNOSIS — J3089 Other allergic rhinitis: Secondary | ICD-10-CM | POA: Diagnosis not present

## 2017-02-21 DIAGNOSIS — J301 Allergic rhinitis due to pollen: Secondary | ICD-10-CM | POA: Diagnosis not present

## 2017-02-21 DIAGNOSIS — J3081 Allergic rhinitis due to animal (cat) (dog) hair and dander: Secondary | ICD-10-CM | POA: Diagnosis not present

## 2017-02-21 DIAGNOSIS — J3089 Other allergic rhinitis: Secondary | ICD-10-CM | POA: Diagnosis not present

## 2017-02-27 DIAGNOSIS — J3081 Allergic rhinitis due to animal (cat) (dog) hair and dander: Secondary | ICD-10-CM | POA: Diagnosis not present

## 2017-02-27 DIAGNOSIS — J301 Allergic rhinitis due to pollen: Secondary | ICD-10-CM | POA: Diagnosis not present

## 2017-02-27 DIAGNOSIS — J3089 Other allergic rhinitis: Secondary | ICD-10-CM | POA: Diagnosis not present

## 2017-03-06 DIAGNOSIS — J3081 Allergic rhinitis due to animal (cat) (dog) hair and dander: Secondary | ICD-10-CM | POA: Diagnosis not present

## 2017-03-06 DIAGNOSIS — J301 Allergic rhinitis due to pollen: Secondary | ICD-10-CM | POA: Diagnosis not present

## 2017-03-06 DIAGNOSIS — J3089 Other allergic rhinitis: Secondary | ICD-10-CM | POA: Diagnosis not present

## 2017-03-13 DIAGNOSIS — J301 Allergic rhinitis due to pollen: Secondary | ICD-10-CM | POA: Diagnosis not present

## 2017-03-13 DIAGNOSIS — J3089 Other allergic rhinitis: Secondary | ICD-10-CM | POA: Diagnosis not present

## 2017-03-13 DIAGNOSIS — J3081 Allergic rhinitis due to animal (cat) (dog) hair and dander: Secondary | ICD-10-CM | POA: Diagnosis not present

## 2017-03-20 DIAGNOSIS — J3089 Other allergic rhinitis: Secondary | ICD-10-CM | POA: Diagnosis not present

## 2017-03-20 DIAGNOSIS — J3081 Allergic rhinitis due to animal (cat) (dog) hair and dander: Secondary | ICD-10-CM | POA: Diagnosis not present

## 2017-03-20 DIAGNOSIS — J301 Allergic rhinitis due to pollen: Secondary | ICD-10-CM | POA: Diagnosis not present

## 2017-03-25 DIAGNOSIS — J301 Allergic rhinitis due to pollen: Secondary | ICD-10-CM | POA: Diagnosis not present

## 2017-03-25 DIAGNOSIS — J3081 Allergic rhinitis due to animal (cat) (dog) hair and dander: Secondary | ICD-10-CM | POA: Diagnosis not present

## 2017-03-25 DIAGNOSIS — J3089 Other allergic rhinitis: Secondary | ICD-10-CM | POA: Diagnosis not present

## 2017-04-03 DIAGNOSIS — J4530 Mild persistent asthma, uncomplicated: Secondary | ICD-10-CM | POA: Diagnosis not present

## 2017-04-03 DIAGNOSIS — J301 Allergic rhinitis due to pollen: Secondary | ICD-10-CM | POA: Diagnosis not present

## 2017-04-03 DIAGNOSIS — J3081 Allergic rhinitis due to animal (cat) (dog) hair and dander: Secondary | ICD-10-CM | POA: Diagnosis not present

## 2017-04-03 DIAGNOSIS — J3089 Other allergic rhinitis: Secondary | ICD-10-CM | POA: Diagnosis not present

## 2017-04-03 DIAGNOSIS — H1045 Other chronic allergic conjunctivitis: Secondary | ICD-10-CM | POA: Diagnosis not present

## 2017-04-10 DIAGNOSIS — J301 Allergic rhinitis due to pollen: Secondary | ICD-10-CM | POA: Diagnosis not present

## 2017-04-10 DIAGNOSIS — J3089 Other allergic rhinitis: Secondary | ICD-10-CM | POA: Diagnosis not present

## 2017-04-10 DIAGNOSIS — J3081 Allergic rhinitis due to animal (cat) (dog) hair and dander: Secondary | ICD-10-CM | POA: Diagnosis not present

## 2017-04-17 DIAGNOSIS — J3089 Other allergic rhinitis: Secondary | ICD-10-CM | POA: Diagnosis not present

## 2017-04-17 DIAGNOSIS — J301 Allergic rhinitis due to pollen: Secondary | ICD-10-CM | POA: Diagnosis not present

## 2017-04-17 DIAGNOSIS — J3081 Allergic rhinitis due to animal (cat) (dog) hair and dander: Secondary | ICD-10-CM | POA: Diagnosis not present

## 2017-04-24 DIAGNOSIS — J301 Allergic rhinitis due to pollen: Secondary | ICD-10-CM | POA: Diagnosis not present

## 2017-04-24 DIAGNOSIS — J3089 Other allergic rhinitis: Secondary | ICD-10-CM | POA: Diagnosis not present

## 2017-04-24 DIAGNOSIS — J3081 Allergic rhinitis due to animal (cat) (dog) hair and dander: Secondary | ICD-10-CM | POA: Diagnosis not present

## 2017-05-08 DIAGNOSIS — J3081 Allergic rhinitis due to animal (cat) (dog) hair and dander: Secondary | ICD-10-CM | POA: Diagnosis not present

## 2017-05-08 DIAGNOSIS — J301 Allergic rhinitis due to pollen: Secondary | ICD-10-CM | POA: Diagnosis not present

## 2017-05-08 DIAGNOSIS — J3089 Other allergic rhinitis: Secondary | ICD-10-CM | POA: Diagnosis not present

## 2017-05-15 DIAGNOSIS — J3089 Other allergic rhinitis: Secondary | ICD-10-CM | POA: Diagnosis not present

## 2017-05-15 DIAGNOSIS — J3081 Allergic rhinitis due to animal (cat) (dog) hair and dander: Secondary | ICD-10-CM | POA: Diagnosis not present

## 2017-05-15 DIAGNOSIS — J301 Allergic rhinitis due to pollen: Secondary | ICD-10-CM | POA: Diagnosis not present

## 2017-05-22 DIAGNOSIS — J301 Allergic rhinitis due to pollen: Secondary | ICD-10-CM | POA: Diagnosis not present

## 2017-05-22 DIAGNOSIS — J3089 Other allergic rhinitis: Secondary | ICD-10-CM | POA: Diagnosis not present

## 2017-06-05 DIAGNOSIS — J301 Allergic rhinitis due to pollen: Secondary | ICD-10-CM | POA: Diagnosis not present

## 2017-06-05 DIAGNOSIS — J3081 Allergic rhinitis due to animal (cat) (dog) hair and dander: Secondary | ICD-10-CM | POA: Diagnosis not present

## 2017-06-05 DIAGNOSIS — J3089 Other allergic rhinitis: Secondary | ICD-10-CM | POA: Diagnosis not present

## 2017-06-12 DIAGNOSIS — J3081 Allergic rhinitis due to animal (cat) (dog) hair and dander: Secondary | ICD-10-CM | POA: Diagnosis not present

## 2017-06-12 DIAGNOSIS — J3089 Other allergic rhinitis: Secondary | ICD-10-CM | POA: Diagnosis not present

## 2017-06-12 DIAGNOSIS — J301 Allergic rhinitis due to pollen: Secondary | ICD-10-CM | POA: Diagnosis not present

## 2017-06-24 DIAGNOSIS — J301 Allergic rhinitis due to pollen: Secondary | ICD-10-CM | POA: Diagnosis not present

## 2017-06-24 DIAGNOSIS — J3089 Other allergic rhinitis: Secondary | ICD-10-CM | POA: Diagnosis not present

## 2017-06-24 DIAGNOSIS — J3081 Allergic rhinitis due to animal (cat) (dog) hair and dander: Secondary | ICD-10-CM | POA: Diagnosis not present

## 2017-07-01 MED FILL — LISINOPRIL 5 MG TABLET: 5 | 90 days supply | Qty: 90 | Fill #0

## 2017-07-07 DIAGNOSIS — I10 Essential (primary) hypertension: Secondary | ICD-10-CM | POA: Diagnosis not present

## 2017-07-07 DIAGNOSIS — E785 Hyperlipidemia, unspecified: Secondary | ICD-10-CM | POA: Diagnosis not present

## 2017-07-07 DIAGNOSIS — E559 Vitamin D deficiency, unspecified: Secondary | ICD-10-CM | POA: Diagnosis not present

## 2017-07-07 DIAGNOSIS — E119 Type 2 diabetes mellitus without complications: Secondary | ICD-10-CM | POA: Diagnosis not present

## 2017-07-08 DIAGNOSIS — J3089 Other allergic rhinitis: Secondary | ICD-10-CM | POA: Diagnosis not present

## 2017-07-08 DIAGNOSIS — J301 Allergic rhinitis due to pollen: Secondary | ICD-10-CM | POA: Diagnosis not present

## 2017-07-08 DIAGNOSIS — J3081 Allergic rhinitis due to animal (cat) (dog) hair and dander: Secondary | ICD-10-CM | POA: Diagnosis not present

## 2017-07-14 MED FILL — ATORVASTATIN 10 MG TABLET: 10 | 30 days supply | Qty: 30 | Fill #0

## 2017-07-15 DIAGNOSIS — J3089 Other allergic rhinitis: Secondary | ICD-10-CM | POA: Diagnosis not present

## 2017-07-15 DIAGNOSIS — J301 Allergic rhinitis due to pollen: Secondary | ICD-10-CM | POA: Diagnosis not present

## 2017-07-15 DIAGNOSIS — J3081 Allergic rhinitis due to animal (cat) (dog) hair and dander: Secondary | ICD-10-CM | POA: Diagnosis not present

## 2017-07-18 DIAGNOSIS — J3081 Allergic rhinitis due to animal (cat) (dog) hair and dander: Secondary | ICD-10-CM | POA: Diagnosis not present

## 2017-07-18 DIAGNOSIS — J3089 Other allergic rhinitis: Secondary | ICD-10-CM | POA: Diagnosis not present

## 2017-07-18 DIAGNOSIS — Z1231 Encounter for screening mammogram for malignant neoplasm of breast: Secondary | ICD-10-CM | POA: Diagnosis not present

## 2017-07-18 DIAGNOSIS — J301 Allergic rhinitis due to pollen: Secondary | ICD-10-CM | POA: Diagnosis not present

## 2017-07-24 DIAGNOSIS — J3089 Other allergic rhinitis: Secondary | ICD-10-CM | POA: Diagnosis not present

## 2017-07-24 DIAGNOSIS — J301 Allergic rhinitis due to pollen: Secondary | ICD-10-CM | POA: Diagnosis not present

## 2017-07-24 DIAGNOSIS — J3081 Allergic rhinitis due to animal (cat) (dog) hair and dander: Secondary | ICD-10-CM | POA: Diagnosis not present

## 2017-07-31 DIAGNOSIS — J3081 Allergic rhinitis due to animal (cat) (dog) hair and dander: Secondary | ICD-10-CM | POA: Diagnosis not present

## 2017-07-31 DIAGNOSIS — J301 Allergic rhinitis due to pollen: Secondary | ICD-10-CM | POA: Diagnosis not present

## 2017-07-31 DIAGNOSIS — J3089 Other allergic rhinitis: Secondary | ICD-10-CM | POA: Diagnosis not present

## 2017-08-04 ENCOUNTER — Encounter (HOSPITAL_COMMUNITY): Payer: Self-pay | Admitting: Emergency Medicine

## 2017-08-04 ENCOUNTER — Ambulatory Visit (HOSPITAL_COMMUNITY)
Admission: EM | Admit: 2017-08-04 | Discharge: 2017-08-04 | Disposition: A | Discharge status: Home / Self Care | Payer: 59 | Attending: Family Medicine | Admitting: Family Medicine

## 2017-08-04 ENCOUNTER — Other Ambulatory Visit: Payer: Self-pay

## 2017-08-04 DIAGNOSIS — J209 Acute bronchitis, unspecified: Secondary | ICD-10-CM

## 2017-08-04 DIAGNOSIS — R05 Cough: Secondary | ICD-10-CM | POA: Diagnosis not present

## 2017-08-04 MED ORDER — IPRATROPIUM-ALBUTEROL 0.5-2.5 (3) MG/3ML IN SOLN
3.0000 mL | Freq: Once | RESPIRATORY_TRACT | Status: AC
  Administered 2017-08-04: 3 mL via RESPIRATORY_TRACT

## 2017-08-04 MED ORDER — IPRATROPIUM-ALBUTEROL 0.5-2.5 (3) MG/3ML IN SOLN
RESPIRATORY_TRACT | Status: AC
  Filled 2017-08-04: qty 3

## 2017-08-04 MED ORDER — PREDNISONE 20 MG PO TABS: 40.0000 mg | ORAL_TABLET | Freq: Every day | ORAL | 0 refills | Status: DC

## 2017-08-04 MED FILL — predniSONE 20 MG TABS: 20 | 5 days supply | Qty: 10 | Fill #0

## 2017-08-04 MED FILL — QVAR REDIHALER 40 MCG/ACT A: 40 | 30 days supply | Qty: 11 | Fill #1

## 2017-08-04 NOTE — Discharge Instructions (Signed)
Push fluids to ensure adequate hydration and keep secretions thin.  Continue with prescribed allergy and asthma medications. 5 days of prednisone. Please follow up with your primary care provider and/or allergy/asthma provider for recheck of symptoms in the next week.

## 2017-08-04 NOTE — ED Triage Notes (Signed)
States has been using inhaler for chronic bronchitis and asthma, no relief. C/o cough and chest tightness.

## 2017-08-04 NOTE — ED Provider Notes (Addendum)
[facility]    CSN: [id number] Arrival date & time: 08/04/17  1003     History   Chief Complaint Chief Complaint  Patient presents with  . Cough    HPI Tracey Huff is a 47 y.o. female.   Dally presents with complaints of increased cough and chest tightness which started two days ago. History of allergies, asthma and bronchitis. Uses singulair, zyzal, qvar and albuterol for symptoms. Albuterol last at 0600 this am which temporarily helps. She states she feels she needs to clear her throat due to some drainage. Denies shortness of breath or cheat pain. Does not smoke. No known fevers. No known ill contacts. Minimal congestion or ear pain. Slight sore throat related to coughing. States she does receive allergy shots.     ROS per HPI.      Past Medical History:  Diagnosis Date  . Asthma   . Headache   . Hypertension   . Shortness of breath dyspnea     Patient Active Problem List   Diagnosis Date Noted  . Menorrhagia with irregular cycle 11/15/2014  . Fibroid uterus 11/15/2014  . Status post hysterectomy 11/15/2014    Past Surgical History:  Procedure Laterality Date  . ABDOMINAL HYSTERECTOMY N/A 11/15/2014   Procedure: HYSTERECTOMY ABDOMINAL/BILATERAL SALPINGECTOMY ;  Surgeon: Will Bonnet, MD;  Location: [facility] ORS;  Service: Gynecology;  Laterality: N/A;  . CESAREAN SECTION    . CYSTOSCOPY N/A 11/15/2014   Procedure: CYSTOSCOPY;  Surgeon: Will Bonnet, MD;  Location: [facility] OR[facility];  Service: Gynecology;  Laterality: N/A;  . DILATION AND CURETTAGE OF UTERUS    . GANGLION CYST EXCISION Left   . UTERINE FIBROID SURGERY      OB History   None      Home Medications    Prior to Admission medications   Medication Sig Start Date End Date Taking? Authorizing Provider  lisinopril (PRINIVIL,ZESTRIL) 5 MG tablet Take 5 mg by mouth daily.   Yes [provider]  albuterol (PROVENTIL HFA;VENTOLIN HFA) 108 (90 BASE) MCG/ACT inhaler Inhale 1-2  puffs into the lungs every 6 (six) hours as needed for wheezing or shortness of breath.    [provider]  beclomethasone (QVAR) 80 MCG/ACT inhaler Inhale 2 puffs into the lungs as needed.    [provider]  levocetirizine (XYZAL) 5 MG tablet Take 5 mg by mouth every evening.    [provider]  predniSONE (DELTASONE) 20 MG tablet Take 2 tablets (40 mg total) by mouth daily with breakfast for 5 days. 08/04/17 08/09/17  Zigmund Gottron, NP    Family History Family History  Problem Relation Age of Onset  . Congestive Heart Failure Mother   . Diabetes Mother   . Hypertension Mother     Social History Social History   Tobacco Use  . Smoking status: Never Smoker  . Smokeless tobacco: Never Used  Substance Use Topics  . Alcohol use: Yes    Comment: occ  . Drug use: No     Allergies   Macadamia nut oil; Apple; Fruit & vegetable daily [nutritional supplements]; Kiwi extract; and Peach [prunus persica]   Review of Systems Review of Systems   Physical Exam Triage Vital Signs ED Triage Vitals  Enc Vitals Group     BP 08/04/17 1024 131/84     Pulse Rate 08/04/17 1024 90     Resp --      Temp 08/04/17 1024 99.8 F (37.7 C)  Temp Source 08/04/17 1024 Oral     SpO2 08/04/17 1024 98 %     Weight --      Height --      Head Circumference --      Peak Flow --      Pain Score 08/04/17 1021 2     Pain Loc --      Pain Edu? --      Excl. in [place]? --    No data found.  Updated Vital Signs BP 131/84 (BP Location: Left Arm)   Pulse 90   Temp 99.8 F (37.7 C) (Oral)   LMP 11/15/2014 (Exact Date)   SpO2 98%   Visual Acuity Right Eye Distance:   Left Eye Distance:   Bilateral Distance:    Right Eye Near:   Left Eye Near:    Bilateral Near:     Physical Exam  Constitutional: She is oriented to person, place, and time. She appears well-developed and well-nourished. No distress.  HENT:  Head: Normocephalic and atraumatic.  Right Ear: Tympanic  membrane, external ear and ear canal normal.  Left Ear: Tympanic membrane, external ear and ear canal normal.  Nose: Nose normal.  Mouth/Throat: Uvula is midline, oropharynx is clear and moist and mucous membranes are normal. No tonsillar exudate.  Eyes: Pupils are equal, round, and reactive to light. Conjunctivae and EOM are normal.  Cardiovascular: Normal rate, regular rhythm and normal heart sounds.  Pulmonary/Chest: Effort normal and breath sounds normal. She has no decreased breath sounds.  Without cough throughout exam. Without wheezes at this time  Neurological: She is alert and oriented to person, place, and time.  Skin: Skin is warm and dry.     UC Treatments / Results  Labs (all labs ordered are listed, but only abnormal results are displayed) Labs Reviewed - No data to display  EKG None Radiology No results found.  Procedures Procedures (including critical care time)  Medications Ordered in UC Medications  ipratropium-albuterol (DUONEB) 0.5-2.5 (3) MG/3ML nebulizer solution 3 mL (has no administration in time range)     Initial Impression / Assessment and Plan / UC Course  I have reviewed the triage vital signs and the nursing notes.  Pertinent labs & imaging results that were available during my care of the patient were reviewed by me and considered in my medical decision making (see chart for details).     Non toxic in appearance. Afebrile. Without tachypnea, tachycardia, hypoxia. Patient states feels improved s/p breathing treatment in clinic. 5 days of prednisone. Continue with prescribed allergy and asthma treatments. Return precautions provided. If symptoms worsen or do not improve in the next week to return to be seen or to follow up with PCP.  Patient verbalized understanding and agreeable to plan.    Final Clinical Impressions(s) / UC Diagnoses   Final diagnoses:  Acute bronchitis, unspecified organism    ED Discharge Orders        Ordered     predniSONE (DELTASONE) 20 MG tablet  Daily with breakfast     08/04/17 1053       Controlled Substance Prescriptions Many Controlled Substance Registry consulted? Not Applicable       Zigmund Gottron, NP 08/04/17 1133

## 2017-08-06 DIAGNOSIS — J209 Acute bronchitis, unspecified: Secondary | ICD-10-CM | POA: Diagnosis not present

## 2017-08-20 DIAGNOSIS — R921 Mammographic calcification found on diagnostic imaging of breast: Secondary | ICD-10-CM | POA: Diagnosis not present

## 2017-08-20 DIAGNOSIS — R928 Other abnormal and inconclusive findings on diagnostic imaging of breast: Secondary | ICD-10-CM | POA: Diagnosis not present

## 2017-08-20 DIAGNOSIS — N6313 Unspecified lump in the right breast, lower outer quadrant: Secondary | ICD-10-CM | POA: Diagnosis not present

## 2017-08-28 ENCOUNTER — Ambulatory Visit
Admission: RE | Admit: 2017-08-28 | Discharge: 2017-08-28 | Disposition: A | Discharge status: Home / Self Care | Payer: 59 | Source: Ambulatory Visit | Attending: Allergy | Admitting: Allergy

## 2017-08-28 ENCOUNTER — Other Ambulatory Visit: Payer: Self-pay | Admitting: Allergy

## 2017-08-28 DIAGNOSIS — J3081 Allergic rhinitis due to animal (cat) (dog) hair and dander: Secondary | ICD-10-CM | POA: Diagnosis not present

## 2017-08-28 DIAGNOSIS — J301 Allergic rhinitis due to pollen: Secondary | ICD-10-CM | POA: Diagnosis not present

## 2017-08-28 DIAGNOSIS — H1045 Other chronic allergic conjunctivitis: Secondary | ICD-10-CM | POA: Diagnosis not present

## 2017-08-28 DIAGNOSIS — J4530 Mild persistent asthma, uncomplicated: Secondary | ICD-10-CM

## 2017-08-28 DIAGNOSIS — J3089 Other allergic rhinitis: Secondary | ICD-10-CM | POA: Diagnosis not present

## 2017-08-28 DIAGNOSIS — J45909 Unspecified asthma, uncomplicated: Secondary | ICD-10-CM | POA: Diagnosis not present

## 2017-08-28 MED FILL — SYMBICORT 160-4.5 MCG INH: 160-4.5 | 30 days supply | Qty: 10 | Fill #0

## 2017-08-28 MED FILL — predniSONE 10 MG TABS: 10 | 4 days supply | Qty: 10 | Fill #0

## 2017-09-01 DIAGNOSIS — J3081 Allergic rhinitis due to animal (cat) (dog) hair and dander: Secondary | ICD-10-CM | POA: Diagnosis not present

## 2017-09-01 DIAGNOSIS — J3089 Other allergic rhinitis: Secondary | ICD-10-CM | POA: Diagnosis not present

## 2017-09-01 DIAGNOSIS — J301 Allergic rhinitis due to pollen: Secondary | ICD-10-CM | POA: Diagnosis not present

## 2017-09-11 DIAGNOSIS — D171 Benign lipomatous neoplasm of skin and subcutaneous tissue of trunk: Secondary | ICD-10-CM | POA: Diagnosis not present

## 2017-09-11 DIAGNOSIS — D485 Neoplasm of uncertain behavior of skin: Secondary | ICD-10-CM | POA: Diagnosis not present

## 2017-09-11 DIAGNOSIS — L821 Other seborrheic keratosis: Secondary | ICD-10-CM | POA: Diagnosis not present

## 2017-09-11 DIAGNOSIS — L83 Acanthosis nigricans: Secondary | ICD-10-CM | POA: Diagnosis not present

## 2017-09-15 DIAGNOSIS — N6314 Unspecified lump in the right breast, lower inner quadrant: Secondary | ICD-10-CM | POA: Diagnosis not present

## 2017-09-15 DIAGNOSIS — D241 Benign neoplasm of right breast: Secondary | ICD-10-CM | POA: Diagnosis not present

## 2017-09-15 DIAGNOSIS — R928 Other abnormal and inconclusive findings on diagnostic imaging of breast: Secondary | ICD-10-CM | POA: Diagnosis not present

## 2017-09-15 DIAGNOSIS — N6091 Unspecified benign mammary dysplasia of right breast: Secondary | ICD-10-CM | POA: Diagnosis not present

## 2017-09-15 DIAGNOSIS — N6313 Unspecified lump in the right breast, lower outer quadrant: Secondary | ICD-10-CM | POA: Diagnosis not present

## 2017-09-15 DIAGNOSIS — N6311 Unspecified lump in the right breast, upper outer quadrant: Secondary | ICD-10-CM | POA: Diagnosis not present

## 2017-09-18 DIAGNOSIS — J3081 Allergic rhinitis due to animal (cat) (dog) hair and dander: Secondary | ICD-10-CM | POA: Diagnosis not present

## 2017-09-18 DIAGNOSIS — J3089 Other allergic rhinitis: Secondary | ICD-10-CM | POA: Diagnosis not present

## 2017-09-18 DIAGNOSIS — J301 Allergic rhinitis due to pollen: Secondary | ICD-10-CM | POA: Diagnosis not present

## 2017-09-25 DIAGNOSIS — J3089 Other allergic rhinitis: Secondary | ICD-10-CM | POA: Diagnosis not present

## 2017-09-25 DIAGNOSIS — J3081 Allergic rhinitis due to animal (cat) (dog) hair and dander: Secondary | ICD-10-CM | POA: Diagnosis not present

## 2017-09-25 DIAGNOSIS — J301 Allergic rhinitis due to pollen: Secondary | ICD-10-CM | POA: Diagnosis not present

## 2017-10-02 DIAGNOSIS — J3089 Other allergic rhinitis: Secondary | ICD-10-CM | POA: Diagnosis not present

## 2017-10-02 DIAGNOSIS — J3081 Allergic rhinitis due to animal (cat) (dog) hair and dander: Secondary | ICD-10-CM | POA: Diagnosis not present

## 2017-10-02 DIAGNOSIS — J301 Allergic rhinitis due to pollen: Secondary | ICD-10-CM | POA: Diagnosis not present

## 2017-10-09 DIAGNOSIS — J3089 Other allergic rhinitis: Secondary | ICD-10-CM | POA: Diagnosis not present

## 2017-10-09 DIAGNOSIS — J3081 Allergic rhinitis due to animal (cat) (dog) hair and dander: Secondary | ICD-10-CM | POA: Diagnosis not present

## 2017-10-09 DIAGNOSIS — J301 Allergic rhinitis due to pollen: Secondary | ICD-10-CM | POA: Diagnosis not present

## 2017-10-16 DIAGNOSIS — J301 Allergic rhinitis due to pollen: Secondary | ICD-10-CM | POA: Diagnosis not present

## 2017-10-16 DIAGNOSIS — J3089 Other allergic rhinitis: Secondary | ICD-10-CM | POA: Diagnosis not present

## 2017-10-16 DIAGNOSIS — J3081 Allergic rhinitis due to animal (cat) (dog) hair and dander: Secondary | ICD-10-CM | POA: Diagnosis not present

## 2017-10-23 DIAGNOSIS — J3089 Other allergic rhinitis: Secondary | ICD-10-CM | POA: Diagnosis not present

## 2017-10-23 DIAGNOSIS — J301 Allergic rhinitis due to pollen: Secondary | ICD-10-CM | POA: Diagnosis not present

## 2017-11-13 DIAGNOSIS — J301 Allergic rhinitis due to pollen: Secondary | ICD-10-CM | POA: Diagnosis not present

## 2017-11-13 DIAGNOSIS — J3089 Other allergic rhinitis: Secondary | ICD-10-CM | POA: Diagnosis not present

## 2017-11-13 DIAGNOSIS — J3081 Allergic rhinitis due to animal (cat) (dog) hair and dander: Secondary | ICD-10-CM | POA: Diagnosis not present

## 2017-11-27 DIAGNOSIS — J3081 Allergic rhinitis due to animal (cat) (dog) hair and dander: Secondary | ICD-10-CM | POA: Diagnosis not present

## 2017-11-27 DIAGNOSIS — J301 Allergic rhinitis due to pollen: Secondary | ICD-10-CM | POA: Diagnosis not present

## 2017-11-27 DIAGNOSIS — J3089 Other allergic rhinitis: Secondary | ICD-10-CM | POA: Diagnosis not present

## 2017-12-04 MED FILL — LISINOPRIL 5 MG TABLET: 5 | 90 days supply | Qty: 90 | Fill #1

## 2017-12-11 DIAGNOSIS — J301 Allergic rhinitis due to pollen: Secondary | ICD-10-CM | POA: Diagnosis not present

## 2017-12-11 DIAGNOSIS — J3089 Other allergic rhinitis: Secondary | ICD-10-CM | POA: Diagnosis not present

## 2017-12-11 DIAGNOSIS — J3081 Allergic rhinitis due to animal (cat) (dog) hair and dander: Secondary | ICD-10-CM | POA: Diagnosis not present

## 2017-12-16 DIAGNOSIS — J3081 Allergic rhinitis due to animal (cat) (dog) hair and dander: Secondary | ICD-10-CM | POA: Diagnosis not present

## 2017-12-16 DIAGNOSIS — J3089 Other allergic rhinitis: Secondary | ICD-10-CM | POA: Diagnosis not present

## 2017-12-16 DIAGNOSIS — J301 Allergic rhinitis due to pollen: Secondary | ICD-10-CM | POA: Diagnosis not present

## 2017-12-16 MED FILL — VENTOLIN HFA 90 MCG INHALER: 108 (90 BAS | 17 days supply | Qty: 18 | Fill #0

## 2017-12-18 DIAGNOSIS — J301 Allergic rhinitis due to pollen: Secondary | ICD-10-CM | POA: Diagnosis not present

## 2017-12-18 DIAGNOSIS — J3081 Allergic rhinitis due to animal (cat) (dog) hair and dander: Secondary | ICD-10-CM | POA: Diagnosis not present

## 2017-12-18 DIAGNOSIS — J3089 Other allergic rhinitis: Secondary | ICD-10-CM | POA: Diagnosis not present

## 2017-12-25 DIAGNOSIS — J3081 Allergic rhinitis due to animal (cat) (dog) hair and dander: Secondary | ICD-10-CM | POA: Diagnosis not present

## 2017-12-25 DIAGNOSIS — J3089 Other allergic rhinitis: Secondary | ICD-10-CM | POA: Diagnosis not present

## 2017-12-25 DIAGNOSIS — J301 Allergic rhinitis due to pollen: Secondary | ICD-10-CM | POA: Diagnosis not present

## 2018-01-08 DIAGNOSIS — J301 Allergic rhinitis due to pollen: Secondary | ICD-10-CM | POA: Diagnosis not present

## 2018-01-08 DIAGNOSIS — J3081 Allergic rhinitis due to animal (cat) (dog) hair and dander: Secondary | ICD-10-CM | POA: Diagnosis not present

## 2018-01-08 DIAGNOSIS — J3089 Other allergic rhinitis: Secondary | ICD-10-CM | POA: Diagnosis not present

## 2018-01-15 DIAGNOSIS — J3081 Allergic rhinitis due to animal (cat) (dog) hair and dander: Secondary | ICD-10-CM | POA: Diagnosis not present

## 2018-01-15 DIAGNOSIS — J301 Allergic rhinitis due to pollen: Secondary | ICD-10-CM | POA: Diagnosis not present

## 2018-01-15 DIAGNOSIS — J3089 Other allergic rhinitis: Secondary | ICD-10-CM | POA: Diagnosis not present

## 2018-01-22 DIAGNOSIS — J3081 Allergic rhinitis due to animal (cat) (dog) hair and dander: Secondary | ICD-10-CM | POA: Diagnosis not present

## 2018-01-22 DIAGNOSIS — J3089 Other allergic rhinitis: Secondary | ICD-10-CM | POA: Diagnosis not present

## 2018-01-22 DIAGNOSIS — J301 Allergic rhinitis due to pollen: Secondary | ICD-10-CM | POA: Diagnosis not present

## 2018-01-29 DIAGNOSIS — J3081 Allergic rhinitis due to animal (cat) (dog) hair and dander: Secondary | ICD-10-CM | POA: Diagnosis not present

## 2018-01-29 DIAGNOSIS — J3089 Other allergic rhinitis: Secondary | ICD-10-CM | POA: Diagnosis not present

## 2018-01-29 DIAGNOSIS — J301 Allergic rhinitis due to pollen: Secondary | ICD-10-CM | POA: Diagnosis not present

## 2018-02-05 DIAGNOSIS — J4530 Mild persistent asthma, uncomplicated: Secondary | ICD-10-CM | POA: Diagnosis not present

## 2018-02-05 DIAGNOSIS — J3081 Allergic rhinitis due to animal (cat) (dog) hair and dander: Secondary | ICD-10-CM | POA: Diagnosis not present

## 2018-02-05 DIAGNOSIS — J301 Allergic rhinitis due to pollen: Secondary | ICD-10-CM | POA: Diagnosis not present

## 2018-02-05 DIAGNOSIS — H1045 Other chronic allergic conjunctivitis: Secondary | ICD-10-CM | POA: Diagnosis not present

## 2018-02-05 DIAGNOSIS — J3089 Other allergic rhinitis: Secondary | ICD-10-CM | POA: Diagnosis not present

## 2018-02-05 MED FILL — MONTELUKAST SOD 10 MG TAB: 10 | 30 days supply | Qty: 30 | Fill #0

## 2018-02-05 MED FILL — LEVOCETIRIZINE 5 MG TABLET: 5 | 30 days supply | Qty: 30 | Fill #0

## 2018-02-12 DIAGNOSIS — J3081 Allergic rhinitis due to animal (cat) (dog) hair and dander: Secondary | ICD-10-CM | POA: Diagnosis not present

## 2018-02-12 DIAGNOSIS — J301 Allergic rhinitis due to pollen: Secondary | ICD-10-CM | POA: Diagnosis not present

## 2018-02-12 DIAGNOSIS — J3089 Other allergic rhinitis: Secondary | ICD-10-CM | POA: Diagnosis not present

## 2018-02-26 DIAGNOSIS — J301 Allergic rhinitis due to pollen: Secondary | ICD-10-CM | POA: Diagnosis not present

## 2018-02-26 DIAGNOSIS — J3081 Allergic rhinitis due to animal (cat) (dog) hair and dander: Secondary | ICD-10-CM | POA: Diagnosis not present

## 2018-02-26 DIAGNOSIS — J3089 Other allergic rhinitis: Secondary | ICD-10-CM | POA: Diagnosis not present

## 2018-03-05 DIAGNOSIS — J3081 Allergic rhinitis due to animal (cat) (dog) hair and dander: Secondary | ICD-10-CM | POA: Diagnosis not present

## 2018-03-05 DIAGNOSIS — J3089 Other allergic rhinitis: Secondary | ICD-10-CM | POA: Diagnosis not present

## 2018-03-05 DIAGNOSIS — J301 Allergic rhinitis due to pollen: Secondary | ICD-10-CM | POA: Diagnosis not present

## 2018-03-12 DIAGNOSIS — J301 Allergic rhinitis due to pollen: Secondary | ICD-10-CM | POA: Diagnosis not present

## 2018-03-12 DIAGNOSIS — J3081 Allergic rhinitis due to animal (cat) (dog) hair and dander: Secondary | ICD-10-CM | POA: Diagnosis not present

## 2018-03-12 DIAGNOSIS — J3089 Other allergic rhinitis: Secondary | ICD-10-CM | POA: Diagnosis not present

## 2018-03-19 DIAGNOSIS — R928 Other abnormal and inconclusive findings on diagnostic imaging of breast: Secondary | ICD-10-CM | POA: Diagnosis not present

## 2018-03-19 DIAGNOSIS — E559 Vitamin D deficiency, unspecified: Secondary | ICD-10-CM | POA: Diagnosis not present

## 2018-03-19 DIAGNOSIS — Z7951 Long term (current) use of inhaled steroids: Secondary | ICD-10-CM | POA: Diagnosis not present

## 2018-03-19 DIAGNOSIS — Z79899 Other long term (current) drug therapy: Secondary | ICD-10-CM | POA: Diagnosis not present

## 2018-03-19 DIAGNOSIS — J301 Allergic rhinitis due to pollen: Secondary | ICD-10-CM | POA: Diagnosis not present

## 2018-03-19 DIAGNOSIS — N6091 Unspecified benign mammary dysplasia of right breast: Secondary | ICD-10-CM | POA: Diagnosis not present

## 2018-03-19 DIAGNOSIS — J3081 Allergic rhinitis due to animal (cat) (dog) hair and dander: Secondary | ICD-10-CM | POA: Diagnosis not present

## 2018-03-19 DIAGNOSIS — J3089 Other allergic rhinitis: Secondary | ICD-10-CM | POA: Diagnosis not present

## 2018-03-19 DIAGNOSIS — J45909 Unspecified asthma, uncomplicated: Secondary | ICD-10-CM | POA: Diagnosis not present

## 2018-03-26 DIAGNOSIS — J3089 Other allergic rhinitis: Secondary | ICD-10-CM | POA: Diagnosis not present

## 2018-03-26 DIAGNOSIS — J301 Allergic rhinitis due to pollen: Secondary | ICD-10-CM | POA: Diagnosis not present

## 2018-03-26 DIAGNOSIS — J3081 Allergic rhinitis due to animal (cat) (dog) hair and dander: Secondary | ICD-10-CM | POA: Diagnosis not present

## 2018-03-31 DIAGNOSIS — J3089 Other allergic rhinitis: Secondary | ICD-10-CM | POA: Diagnosis not present

## 2018-03-31 DIAGNOSIS — J3081 Allergic rhinitis due to animal (cat) (dog) hair and dander: Secondary | ICD-10-CM | POA: Diagnosis not present

## 2018-03-31 DIAGNOSIS — J301 Allergic rhinitis due to pollen: Secondary | ICD-10-CM | POA: Diagnosis not present

## 2018-04-09 DIAGNOSIS — J3089 Other allergic rhinitis: Secondary | ICD-10-CM | POA: Diagnosis not present

## 2018-04-09 DIAGNOSIS — J301 Allergic rhinitis due to pollen: Secondary | ICD-10-CM | POA: Diagnosis not present

## 2018-04-09 DIAGNOSIS — J3081 Allergic rhinitis due to animal (cat) (dog) hair and dander: Secondary | ICD-10-CM | POA: Diagnosis not present

## 2018-04-23 DIAGNOSIS — J3081 Allergic rhinitis due to animal (cat) (dog) hair and dander: Secondary | ICD-10-CM | POA: Diagnosis not present

## 2018-04-23 DIAGNOSIS — J3089 Other allergic rhinitis: Secondary | ICD-10-CM | POA: Diagnosis not present

## 2018-04-23 DIAGNOSIS — J301 Allergic rhinitis due to pollen: Secondary | ICD-10-CM | POA: Diagnosis not present

## 2018-04-28 DIAGNOSIS — J301 Allergic rhinitis due to pollen: Secondary | ICD-10-CM | POA: Diagnosis not present

## 2018-04-28 DIAGNOSIS — J3089 Other allergic rhinitis: Secondary | ICD-10-CM | POA: Diagnosis not present

## 2018-04-28 DIAGNOSIS — J3081 Allergic rhinitis due to animal (cat) (dog) hair and dander: Secondary | ICD-10-CM | POA: Diagnosis not present

## 2018-04-30 DIAGNOSIS — H52223 Regular astigmatism, bilateral: Secondary | ICD-10-CM | POA: Diagnosis not present

## 2018-05-01 DIAGNOSIS — J301 Allergic rhinitis due to pollen: Secondary | ICD-10-CM | POA: Diagnosis not present

## 2018-05-04 DIAGNOSIS — J3089 Other allergic rhinitis: Secondary | ICD-10-CM | POA: Diagnosis not present

## 2018-05-04 DIAGNOSIS — J3081 Allergic rhinitis due to animal (cat) (dog) hair and dander: Secondary | ICD-10-CM | POA: Diagnosis not present

## 2018-05-07 DIAGNOSIS — J301 Allergic rhinitis due to pollen: Secondary | ICD-10-CM | POA: Diagnosis not present

## 2018-05-07 DIAGNOSIS — J3089 Other allergic rhinitis: Secondary | ICD-10-CM | POA: Diagnosis not present

## 2018-05-07 DIAGNOSIS — J3081 Allergic rhinitis due to animal (cat) (dog) hair and dander: Secondary | ICD-10-CM | POA: Diagnosis not present

## 2018-05-14 DIAGNOSIS — J3089 Other allergic rhinitis: Secondary | ICD-10-CM | POA: Diagnosis not present

## 2018-05-14 DIAGNOSIS — J301 Allergic rhinitis due to pollen: Secondary | ICD-10-CM | POA: Diagnosis not present

## 2018-05-14 DIAGNOSIS — J3081 Allergic rhinitis due to animal (cat) (dog) hair and dander: Secondary | ICD-10-CM | POA: Diagnosis not present

## 2018-05-20 ENCOUNTER — Encounter: Payer: Self-pay | Admitting: Family Medicine

## 2018-05-20 ENCOUNTER — Ambulatory Visit: Payer: 59 | Admitting: Family Medicine

## 2018-05-20 VITALS — BP 112/84 | HR 93 | Temp 98.2°F | Resp 16 | Ht 60.0 in | Wt 172.8 lb

## 2018-05-20 DIAGNOSIS — I10 Essential (primary) hypertension: Secondary | ICD-10-CM | POA: Diagnosis not present

## 2018-05-20 DIAGNOSIS — Z6833 Body mass index (BMI) 33.0-33.9, adult: Secondary | ICD-10-CM

## 2018-05-20 DIAGNOSIS — F411 Generalized anxiety disorder: Secondary | ICD-10-CM | POA: Diagnosis not present

## 2018-05-20 DIAGNOSIS — J45909 Unspecified asthma, uncomplicated: Secondary | ICD-10-CM | POA: Diagnosis not present

## 2018-05-20 DIAGNOSIS — E785 Hyperlipidemia, unspecified: Secondary | ICD-10-CM | POA: Insufficient documentation

## 2018-05-20 DIAGNOSIS — Z23 Encounter for immunization: Secondary | ICD-10-CM | POA: Diagnosis not present

## 2018-05-20 DIAGNOSIS — E6609 Other obesity due to excess calories: Secondary | ICD-10-CM | POA: Insufficient documentation

## 2018-05-20 DIAGNOSIS — F430 Acute stress reaction: Secondary | ICD-10-CM | POA: Diagnosis not present

## 2018-05-20 MED ORDER — BUSPIRONE HCL 7.5 MG PO TABS: ORAL_TABLET | ORAL | 1 refills | Status: DC

## 2018-05-20 MED ORDER — LOSARTAN POTASSIUM 25 MG PO TABS: 25.0000 mg | ORAL_TABLET | Freq: Every day | ORAL | 0 refills | Status: DC

## 2018-05-20 MED FILL — LOSARTAN POTASSIUM 25 MG TA: 25 | 90 days supply | Qty: 90 | Fill #0

## 2018-05-20 MED FILL — busPIRone HCL 7.5 MG TABS: 7.5 | 30 days supply | Qty: 60 | Fill #0

## 2018-05-20 NOTE — Progress Notes (Signed)
Name: Tracey Huff   MRN: [id number]    DOB: Jul 26, 1970   Date:05/20/2018       Progress Note  Subjective  Chief Complaint  Chief Complaint  Patient presents with  . Establish Care  . Anxiety    HPI  Pt presents to establish care and for the following:  Anxiety/Stress: Her husband is in the hospital with status epilepticus - has been hospitalized for about 5 days. She has not been sleeping or eating,  Has headache right now.  She has had anxiety in the past, but never like this.  She has had 5 other family members who have passed away in the last year.  She does follow low salt diet.  Discussed counseling - she is not going right now.  HTN: She has history of HTN, but her asthma &allergy doctor wants her to come off of the lisinopril due to SE of coughing.  She is only on 5mg , but is nervous to not have any BP medication on board, so we will switch to ARB today. At work she has seen BP's in the 130's.    Asthma:  She is seeing allergy and asthma for care at this time.  She is taking symbicort, singulair, Xyzal, and albuterol as prescribed.  Her asthma has been poorly controlled in the past, but has been doing okay right now - she notes many triggers and that her Allergy doctor recommended the PNA vaccine.  HLD: Was prescribed 10mg  Atorvastatin, but didn't like the research she found on it.   Obesity: She is not exercising or eating healthy right now - going right from work to be with her husband currently.  Patient Active Problem List   Diagnosis Date Noted  . Menorrhagia with irregular cycle 11/15/2014  . Fibroid uterus 11/15/2014  . Status post hysterectomy 11/15/2014    Past Surgical History:  Procedure Laterality Date  . ABDOMINAL HYSTERECTOMY N/A 11/15/2014   Procedure: HYSTERECTOMY ABDOMINAL/BILATERAL SALPINGECTOMY ;  Surgeon: Will Bonnet, MD;  Location: [facility] ORS;  Service: Gynecology;  Laterality: N/A;  . CESAREAN SECTION    . CYSTOSCOPY N/A 11/15/2014   Procedure: CYSTOSCOPY;  Surgeon: Will Bonnet, MD;  Location: [facility] ORS;  Service: Gynecology;  Laterality: N/A;  . DILATION AND CURETTAGE OF UTERUS    . GANGLION CYST EXCISION Left   . UTERINE FIBROID SURGERY      Family History  Problem Relation Age of Onset  . Congestive Heart Failure Mother   . Diabetes Mother   . Hypertension Mother     Social History   Socioeconomic History  . Marital status: Married    Spouse name: Fritz Pickerel  . Number of children: 2  . Years of education: Not on file  . Highest education level: Not on file  Occupational History  . Not on file  Social Needs  . Financial resource strain: Not on file  . Food insecurity:    Worry: Never true    Inability: Never true  . Transportation needs:    Medical: No    Non-medical: No  Tobacco Use  . Smoking status: Never Smoker  . Smokeless tobacco: Never Used  Substance and Sexual Activity  . Alcohol use: Yes    Comment: occ  . Drug use: No  . Sexual activity: Yes    Partners: Male    Birth control/protection: Surgical  Lifestyle  . Physical activity:    Days per week: 0 days    Minutes per session: 0  min  . Stress: Not at all  Relationships  . Social connections:    Talks on phone: More than three times a week    Gets together: More than three times a week    Attends religious service: More than 4 times per year    Active member of club or organization: No    Attends meetings of clubs or organizations: Never    Relationship status: Married  . Intimate partner violence:    Fear of current or ex partner: No    Emotionally abused: No    Physically abused: No    Forced sexual activity: No  Other Topics Concern  . Not on file  Social History Narrative  . Not on file     Current Outpatient Medications:  .  albuterol (PROVENTIL HFA;VENTOLIN HFA) 108 (90 BASE) MCG/ACT inhaler, Inhale 1-2 puffs into the lungs every 6 (six) hours as needed for wheezing or shortness of breath., Disp: , Rfl:  .   budesonide-formoterol (SYMBICORT) 160-4.5 MCG/ACT inhaler, Inhale into the lungs., Disp: , Rfl:  .  EPINEPHrine 0.15 MG/0.15ML IJ injection, Inject into the skin., Disp: , Rfl:  .  levocetirizine (XYZAL) 5 MG tablet, Take 5 mg by mouth every evening., Disp: , Rfl:  .  lisinopril (PRINIVIL,ZESTRIL) 5 MG tablet, Take 5 mg by mouth daily., Disp: , Rfl:  .  montelukast (SINGULAIR) 10 MG tablet, Take by mouth., Disp: , Rfl:  .  beclomethasone (QVAR) 80 MCG/ACT inhaler, Inhale 2 puffs into the lungs as needed., Disp: , Rfl:   Allergies  Allergen Reactions  . Macadamia Nut Oil Shortness Of Breath  . Apple Nausea And Vomiting  . Fruit & Vegetable Daily [Nutritional Supplements] Nausea And Vomiting    Cannot tolerate apples, peaches, plums, and nectarines  . Kiwi Extract Nausea And Vomiting  . Peach [Prunus Persica] Nausea And Vomiting    I personally reviewed active problem list, medication list, allergies, health maintenance, lab results with the patient/caregiver today.   ROS  Constitutional: Negative for fever or weight change.  Respiratory: Negative for cough and shortness of breath.   Cardiovascular: Negative for chest pain or palpitations.  Gastrointestinal: Negative for abdominal pain, no bowel changes.  Musculoskeletal: Negative for gait problem or joint swelling.  Skin: Negative for rash.  Neurological: Negative for dizziness or headache.  No other specific complaints in a complete review of systems (except as listed in HPI above).  Objective  Vitals:   05/20/18 1241  BP: 112/84  Pulse: 93  Resp: 16  Temp: 98.2 F (36.8 C)  TempSrc: Oral  SpO2: 99%  Weight: 172 lb 12.8 oz (78.4 kg)  Height: 5' (1.524 m)    Body mass index is 33.75 kg/m.  Physical Exam Constitutional: Patient appears well-developed and well-nourished. No distress.  HENT: Head: Normocephalic and atraumatic. Eyes: Conjunctivae and EOM are normal. No scleral icterus. Neck: Normal range of motion.  Neck supple. No JVD present. No thyromegaly present.  Cardiovascular: Normal rate, regular rhythm and normal heart sounds.  No murmur heard. No BLE edema. Pulmonary/Chest: Effort normal and breath sounds normal. No respiratory distress. Musculoskeletal: Normal range of motion, no joint effusions. No gross deformities Neurological: Pt is alert and oriented to person, place, and time. No cranial nerve deficit. Coordination, balance, strength, speech and gait are normal.  Skin: Skin is warm and dry. No rash noted. No erythema.  Psychiatric: Patient has a normal mood and affect, some tearfulness which is appropriate given subject matter of conversation.  behavior is normal. Judgment and thought content normal.  No results found for this or any previous visit (from the past 72 hour(s)).  PHQ2/9: Depression screen PHQ 2/9 05/20/2018  Decreased Interest 1  Down, Depressed, Hopeless 1  PHQ - 2 Score 2  Altered sleeping 2  Tired, decreased energy 0  Change in appetite 2  Feeling bad or failure about yourself  0  Trouble concentrating 0  Moving slowly or fidgety/restless 1  Suicidal thoughts 0  PHQ-9 Score 7  Difficult doing work/chores Somewhat difficult   Fall Risk: Fall Risk  05/20/2018  Falls in the past year? 0  Number falls in past yr: 0  Injury with Fall? 0  Follow up Falls evaluation completed   Assessment & Plan  1. Anxiety in acute stress reaction - See AVS regarding teaching. - busPIRone (BUSPAR) 7.5 MG tablet; Take 1 tablet every night. After 7 days, may take additional tablet once daily as needed for anxiety.  Dispense: 60 tablet; Refill: 1 - COMPLETE METABOLIC PANEL WITH GFR - TSH  2. Asthma, unspecified asthma severity, unspecified whether complicated, unspecified whether persistent - Continue follow up with Allergy and Asthma - Pneumococcal polysaccharide vaccine 23-valent greater than or equal to 2yo subcutaneous/IM  3. Hyperlipidemia, unspecified hyperlipidemia  type - Lipid panel  4. Essential hypertension - See AVS for follow up recommendation - losartan (COZAAR) 25 MG tablet; Take 1 tablet (25 mg total) by mouth daily.  Dispense: 90 tablet; Refill: 0  5. Class 1 obesity due to excess calories with serious comorbidity and body mass index (BMI) of 33.0 to 33.9 in adult - Discussed importance of 150 minutes of physical activity weekly, eat two servings of fish weekly, eat one serving of tree nuts ( cashews, pistachios, pecans, almonds.Marland Kitchen) every other day, eat 6 servings of fruit/vegetables daily and drink plenty of water and avoid sweet beverages.  - Lipid panel

## 2018-05-20 NOTE — Patient Instructions (Addendum)
Message me on Mychart in 1 week to report on BP. Follow up in 2 weeks in the office for BP check.  Sleep Hygiene Tips 1) Get regular. One of the best ways to train your body to sleep well is to go to bed and get up at more or less the same time every day, even on weekends and days off! This regular rhythm will make you feel better and will give your body something to work from. 2) Sleep when sleepy. Only try to sleep when you actually feel tired or sleepy, rather than spending too much time awake in bed. 3) Get up & try again. If you haven't been able to get to sleep after about 20 minutes or more, get up and do something calming or boring until you feel sleepy, then return to bed and try again. Sit quietly on the couch with the lights off (bright light will tell your brain that it is time to wake up), or read something boring like the phone book. Avoid doing anything that is too stimulating or interesting, as this will wake you up even more. 4) Avoid caffeine & nicotine. It is best to avoid consuming any caffeine (in coffee, tea, cola drinks, chocolate, and some medications) or nicotine (cigarettes) for at least 4-6 hours before going to bed. These substances act as stimulants and interfere with the ability to fall asleep 5) Avoid alcohol. It is also best to avoid alcohol for at least 4-6 hours before going to bed. Many people believe that alcohol is relaxing and helps them to get to sleep at first, but it actually interrupts the quality of sleep. 6) Bed is for sleeping. Try not to use your bed for anything other than sleeping and sex, so that your body comes to associate bed with sleep. If you use bed as a place to watch TV, eat, read, work on your laptop, pay bills, and other things, your body will not learn this Connection. 7) No naps. It is best to avoid taking naps during the day, to make sure that you are tired at bedtime. If you can't make it through the day without a  nap, make sure it is for less than an hour and before 3pm. 8) Sleep rituals. You can develop your own rituals of things to remind your body that it is time to sleep - some people find it useful to do relaxing stretches or breathing exercises for 15 minutes before bed each night, or sit calmly with a cup of caffeine-free tea. 9) Bathtime. Having a hot bath 1-2 hours before bedtime can be useful, as it will raise your body temperature, causing you to feel sleepy as your body temperature drops again. Research shows that sleepiness is associated with a drop in body temperature. 10) No clock-watching. Many people who struggle with sleep tend to watch the clock too much. Frequently checking the clock during the night can wake you up (especially if you turn on the light to read the time) and reinforces negative thoughts such as "Oh no, look how late it is, I'll never get to sleep" or "it's so early, I have only slept for 5 hours, this is terrible." 11) Use a sleep diary. This worksheet can be a useful way of making sure you have the right facts about your sleep, rather than making assumptions. Because a diary involves watching the clock (see point 10) it is a good idea to only use it for two weeks to get an idea  of what is going and then perhaps two months down the track to see how you are progressing. 12) Exercise. Regular exercise is a good idea to help with good sleep, but try not to do strenuous exercise in the 4 hours before bedtime. Morning walks are a great way to start the day feeling refreshed! 13) Eat right. A healthy, balanced diet will help you to sleep well, but timing is important. Some people find that a very empty stomach at bedtime is distracting, so it can be useful to have a light snack, but a heavy meal soon before bed can also interrupt sleep. Some people recommend a warm glass of milk, which contains tryptophan, which acts as a natural sleep inducer. 14) The right  space. It is very important that your bed and bedroom are quiet and comfortable for sleeping. A cooler room with enough blankets to stay warm is best, and make sure you have curtains or an eyemask to block out early morning light and earplugs if there is noise outside your room. 15) Keep daytime routine the same. Even if you have a bad night sleep and are tired it is important that you try to keep your daytime activities the same as you had planned. That is, don't avoid activities because you feel tired. This can reinforce the insomnia.  12 Ways to Curb Anxiety  ?Anxiety is normal human sensation. It is what helped our ancestors survive the pitfalls of the wilderness. Anxiety is defined as experiencing worry or nervousness about an imminent event or something with an uncertain outcome. It is a feeling experienced by most people at some point in their lives. Anxiety can be triggered by a very personal issue, such as the illness of a loved one, or an event of global proportions, such as a refugee crisis. Some of the symptoms of anxiety are:  Feeling restless.  Having a feeling of impending danger.  Increased heart rate.  Rapid breathing. Sweating.  Shaking.  Weakness or feeling tired.  Difficulty concentrating on anything except the current worry.  Insomnia.  Stomach or bowel problems. What can we do about anxiety we may be feeling? There are many techniques to help manage stress and relax. Here are 12 ways you can reduce your anxiety almost immediately: 1. Turn off the constant feed of information. Take a social media sabbatical. Studies have shown that social media directly contributes to social anxiety.  2. Monitor your television viewing habits. Are you watching shows that are also contributing to your anxiety, such as 24-hour news stations? Try watching something else, or better yet, nothing at all. Instead, listen to music, read an inspirational book or practice a hobby. 3. Eat  nutritious meals. Also, don't skip meals and keep healthful snacks on hand. Hunger and poor diet contributes to feeling anxious. 4. Sleep. Sleeping on a regular schedule for at least seven to eight hours a night will do wonders for your outlook when you are awake. 5. Exercise. Regular exercise will help rid your body of that anxious energy and help you get more restful sleep. 6. Try deep (diaphragmatic) breathing. Inhale slowly through your nose for five seconds and exhale through your mouth. 7. Practice acceptance and gratitude. When anxiety hits, accept that there are things out of your control that shouldn't be of immediate concern.  8. Seek out humor. When anxiety strikes, watch a funny video, read jokes or call a friend who makes you laugh. Laughter is healing for our bodies and releases endorphins  that are calming. 9. Stay positive. Take the effort to replace negative thoughts with positive ones. Try to see a stressful situation in a positive light. Try to come up with solutions rather than dwelling on the problem. 10. Figure out what triggers your anxiety. Keep a journal and make note of anxious moments and the events surrounding them. This will help you identify triggers you can avoid or even eliminate. 11. Talk to someone. Let a trusted friend, family member or even trained professional know that you are feeling overwhelmed and anxious. Verbalize what you are feeling and why.  12. Volunteer. If your anxiety is triggered by a crisis on a large scale, become an advocate and work to resolve the problem that is causing you unease. Anxiety is often unwelcome and can become overwhelming. If not kept in check, it can become a disorder that could require medical treatment. However, if you take the time to care for yourself and avoid the triggers that make you anxious, you will be able to find moments of relaxation and clarity that make your life much more enjoyable.  Here are some resources to help you  if you feel you are in a mental health crisis:  [place] - Call [phone]  for help - Website with more resources: GripTrip.com.pt  [facility] - Call [phone] for help. - Mobile Crisis Program available 24 hours a day, 365 days a year. - Available for anyone of any age in [place] counties.  [facility] - Address: [address], [place] - Telephone: [phone]  - Hours of Operation: Sunday - Saturday - 8:00 a.m. - 8:00 p.m. - Medicaid, Medicare (Government Issued Only), BCBS, and [place] [facility], [place], Psychiatrists on-site to provide medication management, [place], and Peer Support Care.  [facility] - Call [phone] for help. - Mobile Crisis Program available 24 hours a day, 365 days a year. - Available for anyone of any age in [place]

## 2018-05-21 DIAGNOSIS — J3081 Allergic rhinitis due to animal (cat) (dog) hair and dander: Secondary | ICD-10-CM | POA: Diagnosis not present

## 2018-05-21 DIAGNOSIS — E785 Hyperlipidemia, unspecified: Secondary | ICD-10-CM | POA: Diagnosis not present

## 2018-05-21 DIAGNOSIS — F411 Generalized anxiety disorder: Secondary | ICD-10-CM | POA: Diagnosis not present

## 2018-05-21 DIAGNOSIS — J3089 Other allergic rhinitis: Secondary | ICD-10-CM | POA: Diagnosis not present

## 2018-05-21 DIAGNOSIS — J301 Allergic rhinitis due to pollen: Secondary | ICD-10-CM | POA: Diagnosis not present

## 2018-05-21 DIAGNOSIS — Z6833 Body mass index (BMI) 33.0-33.9, adult: Secondary | ICD-10-CM | POA: Diagnosis not present

## 2018-05-21 DIAGNOSIS — F430 Acute stress reaction: Secondary | ICD-10-CM | POA: Diagnosis not present

## 2018-05-21 DIAGNOSIS — E6609 Other obesity due to excess calories: Secondary | ICD-10-CM | POA: Diagnosis not present

## 2018-05-22 LAB — COMPLETE METABOLIC PANEL WITH GFR
AG RATIO: 1.3 (calc) (ref 1.0–2.5)
ALT: 12 U/L (ref 6–29)
AST: 14 U/L (ref 10–35)
Albumin: 3.9 g/dL (ref 3.6–5.1)
Alkaline phosphatase (APISO): 43 U/L (ref 33–115)
BUN: 10 mg/dL (ref 7–25)
CHLORIDE: 106 mmol/L (ref 98–110)
CO2: 23 mmol/L (ref 20–32)
Calcium: 9.1 mg/dL (ref 8.6–10.2)
Creat: 0.74 mg/dL (ref 0.50–1.10)
GFR, EST NON AFRICAN AMERICAN: 96 mL/min/{1.73_m2} (ref >=60)
GFR, Est African American: 112 mL/min/{1.73_m2} (ref >=60)
GLUCOSE: 105 mg/dL — AB (ref 65–99)
Globulin: 3 g/dL (calc) (ref 1.9–3.7)
POTASSIUM: 3.6 mmol/L (ref 3.5–5.3)
Sodium: 137 mmol/L (ref 135–146)
Total Bilirubin: 0.4 mg/dL (ref 0.2–1.2)
Total Protein: 6.9 g/dL (ref 6.1–8.1)

## 2018-05-22 LAB — LIPID PANEL
CHOL/HDL RATIO: 4.7 (calc) (ref <5.0)
Cholesterol: 225 mg/dL — ABNORMAL HIGH (ref <200)
HDL: 48 mg/dL — AB (ref >50)
LDL Cholesterol (Calc): 161 mg/dL (calc) — ABNORMAL HIGH
NON-HDL CHOLESTEROL (CALC): 177 mg/dL — AB (ref <130)
Triglycerides: 63 mg/dL (ref <150)

## 2018-05-22 LAB — TSH: TSH: 1.23 m[IU]/L

## 2018-05-26 ENCOUNTER — Encounter: Payer: Self-pay | Admitting: *Deleted

## 2018-05-28 DIAGNOSIS — J3089 Other allergic rhinitis: Secondary | ICD-10-CM | POA: Diagnosis not present

## 2018-05-28 DIAGNOSIS — J301 Allergic rhinitis due to pollen: Secondary | ICD-10-CM | POA: Diagnosis not present

## 2018-05-28 DIAGNOSIS — J3081 Allergic rhinitis due to animal (cat) (dog) hair and dander: Secondary | ICD-10-CM | POA: Diagnosis not present

## 2018-06-03 ENCOUNTER — Ambulatory Visit (INDEPENDENT_AMBULATORY_CARE_PROVIDER_SITE_OTHER): Payer: 59

## 2018-06-03 VITALS — BP 144/88 | HR 68

## 2018-06-03 DIAGNOSIS — I10 Essential (primary) hypertension: Secondary | ICD-10-CM

## 2018-06-03 NOTE — Patient Instructions (Signed)
Patient is here for a blood pressure check. Patient denies chest pain, palpitations, shortness of breath or visual disturbances. At previous visit blood pressure was 112/84 with a heart rate of 93. Today during nurse visit first check blood pressure was 144/88.   Raelyn Ensign, NP was informed.

## 2018-06-04 DIAGNOSIS — J301 Allergic rhinitis due to pollen: Secondary | ICD-10-CM | POA: Diagnosis not present

## 2018-06-04 DIAGNOSIS — J3089 Other allergic rhinitis: Secondary | ICD-10-CM | POA: Diagnosis not present

## 2018-06-04 DIAGNOSIS — J3081 Allergic rhinitis due to animal (cat) (dog) hair and dander: Secondary | ICD-10-CM | POA: Diagnosis not present

## 2018-06-11 DIAGNOSIS — J301 Allergic rhinitis due to pollen: Secondary | ICD-10-CM | POA: Diagnosis not present

## 2018-06-11 DIAGNOSIS — J3089 Other allergic rhinitis: Secondary | ICD-10-CM | POA: Diagnosis not present

## 2018-06-11 DIAGNOSIS — J3081 Allergic rhinitis due to animal (cat) (dog) hair and dander: Secondary | ICD-10-CM | POA: Diagnosis not present

## 2018-06-18 DIAGNOSIS — J3081 Allergic rhinitis due to animal (cat) (dog) hair and dander: Secondary | ICD-10-CM | POA: Diagnosis not present

## 2018-06-18 DIAGNOSIS — J301 Allergic rhinitis due to pollen: Secondary | ICD-10-CM | POA: Diagnosis not present

## 2018-06-18 DIAGNOSIS — J3089 Other allergic rhinitis: Secondary | ICD-10-CM | POA: Diagnosis not present

## 2018-06-22 ENCOUNTER — Encounter: Payer: 59 | Admitting: Family Medicine

## 2018-06-23 ENCOUNTER — Encounter: Payer: Self-pay | Admitting: Family Medicine

## 2018-06-23 ENCOUNTER — Other Ambulatory Visit: Payer: Self-pay

## 2018-06-23 ENCOUNTER — Ambulatory Visit (INDEPENDENT_AMBULATORY_CARE_PROVIDER_SITE_OTHER): Payer: 59 | Admitting: Family Medicine

## 2018-06-23 ENCOUNTER — Other Ambulatory Visit (HOSPITAL_COMMUNITY)
Admission: RE | Admit: 2018-06-23 | Discharge: 2018-06-23 | Disposition: A | Discharge status: Home / Self Care | Payer: 59 | Source: Ambulatory Visit | Attending: Family Medicine | Admitting: Family Medicine

## 2018-06-23 VITALS — BP 134/84 | HR 81 | Temp 98.2°F | Resp 16 | Ht 60.0 in | Wt 168.2 lb

## 2018-06-23 DIAGNOSIS — Z1212 Encounter for screening for malignant neoplasm of rectum: Secondary | ICD-10-CM

## 2018-06-23 DIAGNOSIS — F411 Generalized anxiety disorder: Secondary | ICD-10-CM | POA: Diagnosis not present

## 2018-06-23 DIAGNOSIS — F430 Acute stress reaction: Secondary | ICD-10-CM

## 2018-06-23 DIAGNOSIS — J45909 Unspecified asthma, uncomplicated: Secondary | ICD-10-CM

## 2018-06-23 DIAGNOSIS — E785 Hyperlipidemia, unspecified: Secondary | ICD-10-CM | POA: Diagnosis not present

## 2018-06-23 DIAGNOSIS — Z0000 Encounter for general adult medical examination without abnormal findings: Secondary | ICD-10-CM

## 2018-06-23 DIAGNOSIS — E66811 Obesity, class 1: Secondary | ICD-10-CM

## 2018-06-23 DIAGNOSIS — E6609 Other obesity due to excess calories: Secondary | ICD-10-CM | POA: Diagnosis not present

## 2018-06-23 DIAGNOSIS — Z6833 Body mass index (BMI) 33.0-33.9, adult: Secondary | ICD-10-CM

## 2018-06-23 DIAGNOSIS — R739 Hyperglycemia, unspecified: Secondary | ICD-10-CM | POA: Diagnosis not present

## 2018-06-23 DIAGNOSIS — Z113 Encounter for screening for infections with a predominantly sexual mode of transmission: Secondary | ICD-10-CM | POA: Diagnosis not present

## 2018-06-23 DIAGNOSIS — Z1211 Encounter for screening for malignant neoplasm of colon: Secondary | ICD-10-CM

## 2018-06-23 DIAGNOSIS — I10 Essential (primary) hypertension: Secondary | ICD-10-CM

## 2018-06-23 DIAGNOSIS — Z90710 Acquired absence of both cervix and uterus: Secondary | ICD-10-CM

## 2018-06-23 NOTE — Assessment & Plan Note (Signed)
Doing well on Buspar PRN, she is not wanting a daily SSRI/SNRI at this time.  Husband is still in the hospital, but has been moved to rehab floor.

## 2018-06-23 NOTE — Assessment & Plan Note (Signed)
Pap performed today, Cervix still intact.

## 2018-06-23 NOTE — Patient Instructions (Addendum)
Your LDL is above normal.  The LDL is the "lousy" or bad cholesterol. Over time and in combination with inflammation and other factors, this contributes to plaque which in turn may lead to stroke and/or heart attack down the road.  Sometimes high LDL is primarily genetic, and people might be eating all the right foods but still have high numbers.  Other times, there is room for improvement in one's diet and eating healthier can bring this number down and potentially reduce one's risk of heart attack and/or stroke.  Your LDL level should be below 100. If you have diabetes or a possible heart problem, your LDL should be below 70.  Some strategies to focus on to help improve your LDL levels:  - Eat 20 to 30 grams of fiber every day.  - Eat Foods such as fruits and vegetables, whole grains, beans, peas, nuts, and seeds can help lower LDL. - Avoid Saturated fats - Dairy foods - such as butter, cream, ghee, regular-fat milk and cheese. Meat - such as fatty cuts of beef, pork and lamb, processed meats like salami, sausages and the skin on chicken. Lard., fatty snack foods, cakes, biscuits, pies and deep fried foods) - Avoid smoking  HDL removes extra cholesterol and plaque buildup in your arteries and then sends it to your liver to remove it from your body; this helps reduce your risk of heart disease, heart attack, and stroke.  Foods that increase HDL include beans and legumes, whole grains, high-fiber fruits:prunes, apples, and pears; fatty fish- salmon, tuna, sardines; nuts, olive oil.   Preventive Care 40-64 Years, Female Preventive care refers to lifestyle choices and visits with your health care provider that can promote health and wellness. What does preventive care include?   A yearly physical exam. This is also called an annual well check.  Dental exams once or twice a year.  Routine eye exams. Ask your health care provider how often you should have your eyes checked.  Personal lifestyle  choices, including: ? Daily care of your teeth and gums. ? Regular physical activity. ? Eating a healthy diet. ? Avoiding tobacco and drug use. ? Limiting alcohol use. ? Practicing safe sex. ? Taking low-dose aspirin daily starting at age 41. ? Taking vitamin and mineral supplements as recommended by your health care provider. What happens during an annual well check? The services and screenings done by your health care provider during your annual well check will depend on your age, overall health, lifestyle risk factors, and family history of disease. Counseling Your health care provider may ask you questions about your:  Alcohol use.  Tobacco use.  Drug use.  Emotional well-being.  Home and relationship well-being.  Sexual activity.  Eating habits.  Work and work Statistician.  Method of birth control.  Menstrual cycle.  Pregnancy history. Screening You may have the following tests or measurements:  Height, weight, and BMI.  Blood pressure.  Lipid and cholesterol levels. These may be checked every 5 years, or more frequently if you are over 50 years old.  Skin check.  Lung cancer screening. You may have this screening every year starting at age 35 if you have a 30-pack-year history of smoking and currently smoke or have quit within the past 15 years.  Colorectal cancer screening. All adults should have this screening starting at age 41 and continuing until age 75. Your health care provider may recommend screening at age 33. You will have tests every 1-10 years, depending on your  results and the type of screening test. People at increased risk should start screening at an earlier age. Screening tests may include: ? Guaiac-based fecal occult blood testing. ? Fecal immunochemical test (FIT). ? Stool DNA test. ? Virtual colonoscopy. ? Sigmoidoscopy. During this test, a flexible tube with a tiny camera (sigmoidoscope) is used to examine your rectum and lower colon.  The sigmoidoscope is inserted through your anus into your rectum and lower colon. ? Colonoscopy. During this test, a long, thin, flexible tube with a tiny camera (colonoscope) is used to examine your entire colon and rectum.  Hepatitis C blood test.  Hepatitis B blood test.  Sexually transmitted disease (STD) testing.  Diabetes screening. This is done by checking your blood sugar (glucose) after you have not eaten for a while (fasting). You may have this done every 1-3 years.  Mammogram. This may be done every 1-2 years. Talk to your health care provider about when you should start having regular mammograms. This may depend on whether you have a family history of breast cancer.  BRCA-related cancer screening. This may be done if you have a family history of breast, ovarian, tubal, or peritoneal cancers.  Pelvic exam and Pap test. This may be done every 3 years starting at age 91. Starting at age 84, this may be done every 5 years if you have a Pap test in combination with an HPV test.  Bone density scan. This is done to screen for osteoporosis. You may have this scan if you are at high risk for osteoporosis. Discuss your test results, treatment options, and if necessary, the need for more tests with your health care provider. Vaccines Your health care provider may recommend certain vaccines, such as:  Influenza vaccine. This is recommended every year.  Tetanus, diphtheria, and acellular pertussis (Tdap, Td) vaccine. You may need a Td booster every 10 years.  Varicella vaccine. You may need this if you have not been vaccinated.  Zoster vaccine. You may need this after age 79.  Measles, mumps, and rubella (MMR) vaccine. You may need at least one dose of MMR if you were born in 1957 or later. You may also need a second dose.  Pneumococcal 13-valent conjugate (PCV13) vaccine. You may need this if you have certain conditions and were not previously vaccinated.  Pneumococcal  polysaccharide (PPSV23) vaccine. You may need one or two doses if you smoke cigarettes or if you have certain conditions.  Meningococcal vaccine. You may need this if you have certain conditions.  Hepatitis A vaccine. You may need this if you have certain conditions or if you travel or work in places where you may be exposed to hepatitis A.  Hepatitis B vaccine. You may need this if you have certain conditions or if you travel or work in places where you may be exposed to hepatitis B.  Haemophilus influenzae type b (Hib) vaccine. You may need this if you have certain conditions. Talk to your health care provider about which screenings and vaccines you need and how often you need them. This information is not intended to replace advice given to you by your health care provider. Make sure you discuss any questions you have with your health care provider. Document Released: 05/19/2015 Document Revised: 06/12/2017 Document Reviewed: 02/21/2015 Elsevier Interactive Patient Education  2019 [employer].

## 2018-06-23 NOTE — Assessment & Plan Note (Signed)
Doing well on losartan; off lisinopril due to cough.

## 2018-06-23 NOTE — Assessment & Plan Note (Signed)
Discussed importance of 150 minutes of physical activity weekly, eat two servings of fish weekly, eat one serving of tree nuts ( cashews, pistachios, pecans, almonds..) every other day, eat 6 servings of fruit/vegetables daily and drink plenty of water and avoid sweet beverages. 

## 2018-06-23 NOTE — Assessment & Plan Note (Signed)
Lifestyle modifications reinforced.

## 2018-06-23 NOTE — Progress Notes (Signed)
Name: Tracey Huff   MRN: [id number]    DOB: January 03, 1971   Date:06/23/2018       Progress Note  Subjective  Chief Complaint  Chief Complaint  Patient presents with  . Annual Exam  . Hypertension    follow up    HPI  Patient presents for annual CPE and follow up:  Anxiety/Stress: Her husband is in the hospital with seizures and other issues for over a month now. She has been taking buspar and this helps her to sleep, often she sleeps without taking the medication.  She has had anxiety in the past, but never like this.  She has had 5 other family members who have passed away in the last year.  Discussed counseling - she is not going right now.  Anxiety has improved.  HTN: We stopped lisinopril due to allergist suggesting stopping due to chronic cough, taking losartan and doing well on this.  Her cough has improved as well.  At work she has seen BP's in the 130's/70's. . Occasional headaches; denies chest pain, shortness of breath, palpitations, no BLE edema. She is not following low sodium diet.   Asthma:  She is seeing allergy and asthma for care at this time.  She is taking symbicort, singulair, Xyzal, and albuterol as prescribed.  Her asthma has been poorly controlled in the past, but has been doing okay right now - she notes many triggers. Stable and unchanged.  HLD: Was prescribed '10mg'$  Atorvastatin, but didn't like the research she found on it. Discussed HLD diet.  Obesity: She is not exercising or eating healthy right now - going right from work to be with her husband currently.  She is eating fast food frequently.  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  USPSTF grade A and B recommendations    Office Visit from 06/23/2018 in [facility]  AUDIT-C Score  0     Depression:  Depression screen [facility] 2/9 06/23/2018 05/20/2018  Decreased Interest 0 1  Down, Depressed, Hopeless 0 1  PHQ - 2 Score  0 2  Altered sleeping 2 2  Tired, decreased energy 0 0  Change in appetite 1 2  Feeling bad or failure about yourself  0 0  Trouble concentrating 0 0  Moving slowly or fidgety/restless 1 1  Suicidal thoughts 0 0  PHQ-9 Score 4 7  Difficult doing work/chores Somewhat difficult Somewhat difficult   Hypertension: BP Readings from Last 3 Encounters:  06/23/18 134/84  06/03/18 (!) 144/88  05/20/18 112/84   Obesity: Wt Readings from Last 3 Encounters:  06/23/18 168 lb 3.2 oz (76.3 kg)  05/20/18 172 lb 12.8 oz (78.4 kg)  01/07/17 172 lb (78 kg)   BMI Readings from Last 3 Encounters:  06/23/18 32.85 kg/m  05/20/18 33.75 kg/m  01/07/17 33.59 kg/m    Hep C Screening: She declines - had done with employee health and wellness.  STD testing and prevention (HIV/chl/gon/syphilis): HIV and RPR today; Will check Gc/chlamydia today Intimate partner violence: No concerns Sexual History/Pain during Intercourse: No concerns Menstrual History/LMP/Abnormal Bleeding: Hysterectomy 3 years ago; still has ovaries, she is unsure if still has cervix Incontinence Symptoms: No concerns  Advanced Care Planning: A voluntary discussion about advance care planning including the explanation and discussion of advance directives.  Discussed health care proxy and Living will, and the patient was able to identify a health care proxy as  Daughters (Taja [place], [place]).  Patient does not have a living will at present time. If patient does have living will, I have requested they bring this to the clinic to be scanned in to their chart.  Breast cancer: No family history; has mammogram scheduled with [facility] May 14th. No results found for: HMMAMMO  BRCA gene screening: N/A Cervical cancer screening: We will check today  Osteoporosis Screening: No family history;  No results found for: HMDEXASCAN  Lipids:  Lab Results  Component Value Date   CHOL 225 (H) 05/21/2018   Lab Results  Component  Value Date   HDL 48 (L) 05/21/2018   Lab Results  Component Value Date   LDLCALC 161 (H) 05/21/2018   Lab Results  Component Value Date   TRIG 63 05/21/2018   Lab Results  Component Value Date   CHOLHDL 4.7 05/21/2018   No results found for: LDLDIRECT  Glucose:  Glucose  Date Value Ref Range Status  08/24/2014 100 (H) mg/dL Final    Comment:    65-99 NOTE: New Reference Range  07/12/14   05/12/2011 98 65 - 99 mg/dL Final  05/11/2011 98 65 - 99 mg/dL Final   Glucose, Bld  Date Value Ref Range Status  05/21/2018 105 (H) 65 - 99 mg/dL Final    Comment:    .            Fasting reference interval . For someone without known diabetes, a glucose value between 100 and 125 mg/dL is consistent with prediabetes and should be confirmed with a follow-up test. .   11/17/2014 99 65 - 99 mg/dL Final  11/16/2014 109 (H) 65 - 99 mg/dL Final   Skin cancer: No concerning lesions Colorectal cancer: Denies family or personal history of colorectal cancer, no changes in BM's - no blood in stool, dark and tarry stool, mucus in stool, or constipation/diarrhea. Discussed increased risk for African American population and she would like to speak with GI.  Lung cancer:  Never smoker;  Low Dose CT Chest recommended if Age 23-80 years, 30 pack-year currently smoking OR have quit w/in 15years. Patient does not qualify.   ECG: N/A  Patient Active Problem List   Diagnosis Date Noted  . Class 1 obesity due to excess calories with serious comorbidity and body mass index (BMI) of 33.0 to 33.9 in adult 05/20/2018  . Essential hypertension 05/20/2018  . Hyperlipidemia 05/20/2018  . Asthma 05/20/2018  . Anxiety in acute stress reaction 05/20/2018  . Menorrhagia with irregular cycle 11/15/2014  . Fibroid uterus 11/15/2014  . Status post hysterectomy 11/15/2014    Past Surgical History:  Procedure Laterality Date  . ABDOMINAL HYSTERECTOMY N/A 11/15/2014   Procedure: HYSTERECTOMY  ABDOMINAL/BILATERAL SALPINGECTOMY ;  Surgeon: Will Bonnet, MD;  Location: [facility] ORS;  Service: Gynecology;  Laterality: N/A;  . CESAREAN SECTION    . CYSTOSCOPY N/A 11/15/2014   Procedure: CYSTOSCOPY;  Surgeon: Will Bonnet, MD;  Location: [facility] ORS;  Service: Gynecology;  Laterality: N/A;  . DILATION AND CURETTAGE OF UTERUS    . GANGLION CYST EXCISION Left   . UTERINE FIBROID SURGERY      Family History  Problem Relation Age of Onset  . Congestive Heart Failure Mother   . Diabetes Mother   . Hypertension Mother     Social History   Socioeconomic History  . Marital status: Married    Spouse name: Fritz Pickerel  . Number of children: 2  . Years of education:  Not on file  . Highest education level: Not on file  Occupational History  . Not on file  Social Needs  . Financial resource strain: Not on file  . Food insecurity:    Worry: Never true    Inability: Never true  . Transportation needs:    Medical: No    Non-medical: No  Tobacco Use  . Smoking status: Never Smoker  . Smokeless tobacco: Never Used  Substance and Sexual Activity  . Alcohol use: Not Currently    Comment: occ  . Drug use: No  . Sexual activity: Yes    Partners: Male    Birth control/protection: Surgical  Lifestyle  . Physical activity:    Days per week: 0 days    Minutes per session: 0 min  . Stress: Not at all  Relationships  . Social connections:    Talks on phone: More than three times a week    Gets together: More than three times a week    Attends religious service: More than 4 times per year    Active member of club or organization: No    Attends meetings of clubs or organizations: Never    Relationship status: Married  . Intimate partner violence:    Fear of current or ex partner: No    Emotionally abused: No    Physically abused: No    Forced sexual activity: No  Other Topics Concern  . Not on file  Social History Narrative  . Not on file     Current Outpatient Medications:   .  albuterol (PROVENTIL HFA;VENTOLIN HFA) 108 (90 BASE) MCG/ACT inhaler, Inhale 1-2 puffs into the lungs every 6 (six) hours as needed for wheezing or shortness of breath., Disp: , Rfl:  .  budesonide-formoterol (SYMBICORT) 160-4.5 MCG/ACT inhaler, Inhale into the lungs., Disp: , Rfl:  .  busPIRone (BUSPAR) 7.5 MG tablet, Take 1 tablet every night. After 7 days, may take additional tablet once daily as needed for anxiety., Disp: 60 tablet, Rfl: 1 .  EPINEPHrine 0.15 MG/0.15ML IJ injection, Inject into the skin., Disp: , Rfl:  .  levocetirizine (XYZAL) 5 MG tablet, Take 5 mg by mouth every evening., Disp: , Rfl:  .  losartan (COZAAR) 25 MG tablet, Take 1 tablet (25 mg total) by mouth daily., Disp: 90 tablet, Rfl: 0 .  montelukast (SINGULAIR) 10 MG tablet, Take by mouth., Disp: , Rfl:   Allergies  Allergen Reactions  . Macadamia Nut Oil Shortness Of Breath  . Apple Nausea And Vomiting  . Fruit & Vegetable Daily [Nutritional Supplements] Nausea And Vomiting    Cannot tolerate apples, peaches, plums, and nectarines  . Kiwi Extract Nausea And Vomiting  . Peach [Prunus Persica] Nausea And Vomiting     ROS  Constitutional: Negative for fever or weight change.  Respiratory: Negative for cough and shortness of breath.   Cardiovascular: Negative for chest pain or palpitations.  Gastrointestinal: Negative for abdominal pain, no bowel changes.  Musculoskeletal: Negative for gait problem or joint swelling.  Skin: Negative for rash.  Neurological: Negative for dizziness or headache.  No other specific complaints in a complete review of systems (except as listed in HPI above).  Objective  Vitals:   06/23/18 0806  BP: 134/84  Pulse: 81  Resp: 16  Temp: 98.2 F (36.8 C)  TempSrc: Oral  SpO2: 99%  Weight: 168 lb 3.2 oz (76.3 kg)  Height: 5' (1.524 m)    Body mass index is 32.85 kg/m.  Physical  Exam Constitutional: Patient appears well-developed and well-nourished. No distress.   HENT: Head: Normocephalic and atraumatic. Ears: B TMs ok, no erythema or effusion; Nose: Nose normal. Mouth/Throat: Oropharynx is clear and moist. No oropharyngeal exudate.  Eyes: Conjunctivae and EOM are normal. Neck: Normal range of motion. Neck supple. No JVD present. No thyromegaly present.  Cardiovascular: Normal rate, regular rhythm and normal heart sounds.  No murmur heard. No BLE edema. Pulmonary/Chest: Effort normal and breath sounds normal. No respiratory distress. Abdominal: Soft. Bowel sounds are normal, no distension. There is no tenderness. no masses Breast: no lumps or masses, no nipple discharge or rashes FEMALE GENITALIA:  External genitalia normal External urethra normal Vaginal vault normal without discharge or lesions Cervix normal without discharge or lesions Bimanual exam normal without masses Musculoskeletal: Normal range of motion, no joint effusions. No gross deformities Neurological: he is alert and oriented to person, place, and time. No cranial nerve deficit. Coordination, balance, strength, speech and gait are normal.  Skin: Skin is warm and dry. No rash noted. No erythema. Several hyperpigmented skin tags to cheeks. Acanthosis nigricans to neck. Psychiatric: Patient has a normal mood and affect. behavior is normal. Judgment and thought content normal.   Recent Results (from the past 2160 hour(s))  Lipid panel     Status: Abnormal   Collection Time: 05/21/18  9:25 AM  Result Value Ref Range   Cholesterol 225 (H) <200 mg/dL   HDL 48 (L) >50 mg/dL   Triglycerides 63 <150 mg/dL   LDL Cholesterol (Calc) 161 (H) mg/dL (calc)    Comment: Reference range: <100 . Desirable range <100 mg/dL for primary prevention;   <70 mg/dL for patients with CHD or diabetic patients  with > or = 2 CHD risk factors. Marland Kitchen LDL-C is now calculated using the Martin-Hopkins  calculation, which is a validated novel method providing  better accuracy than the Friedewald equation in the   estimation of LDL-C.  Cresenciano Genre et al. Annamaria Helling. 4818;563(14): 2061-2068  (http://education.[facility].com/faq/FAQ164)    Total CHOL/HDL Ratio 4.7 <5.0 (calc)   Non-HDL Cholesterol (Calc) 177 (H) <130 mg/dL (calc)    Comment: For patients with diabetes plus 1 major ASCVD risk  factor, treating to a non-HDL-C goal of <100 mg/dL  (LDL-C of <70 mg/dL) is considered a therapeutic  option.   COMPLETE METABOLIC PANEL WITH GFR     Status: Abnormal   Collection Time: 05/21/18  9:25 AM  Result Value Ref Range   Glucose, Bld 105 (H) 65 - 99 mg/dL    Comment: .            Fasting reference interval . For someone without known diabetes, a glucose value between 100 and 125 mg/dL is consistent with prediabetes and should be confirmed with a follow-up test. .    BUN 10 7 - 25 mg/dL   Creat 0.74 0.50 - 1.10 mg/dL   GFR, Est Non African American 96 > OR = 60 mL/min/1.17m   GFR, Est African American 112 > OR = 60 mL/min/1.721m  BUN/Creatinine Ratio NOT APPLICABLE 6 - 22 (calc)   Sodium 137 135 - 146 mmol/L   Potassium 3.6 3.5 - 5.3 mmol/L   Chloride 106 98 - 110 mmol/L   CO2 23 20 - 32 mmol/L   Calcium 9.1 8.6 - 10.2 mg/dL   Total Protein 6.9 6.1 - 8.1 g/dL   Albumin 3.9 3.6 - 5.1 g/dL   Globulin 3.0 1.9 - 3.7 g/dL (calc)   AG Ratio 1.3  1.0 - 2.5 (calc)   Total Bilirubin 0.4 0.2 - 1.2 mg/dL   Alkaline phosphatase (APISO) 43 33 - 115 U/L   AST 14 10 - 35 U/L   ALT 12 6 - 29 U/L  TSH     Status: None   Collection Time: 05/21/18  9:25 AM  Result Value Ref Range   TSH 1.23 mIU/L    Comment:           Reference Range .           > or = 20 Years  0.40-4.50 .                Pregnancy Ranges           First trimester    0.26-2.66           Second trimester   0.55-2.73           Third trimester    0.43-2.91    PHQ2/9: Depression screen [facility] 2/9 06/23/2018 05/20/2018  Decreased Interest 0 1  Down, Depressed, Hopeless 0 1  PHQ - 2 Score 0 2  Altered sleeping 2 2  Tired, decreased  energy 0 0  Change in appetite 1 2  Feeling bad or failure about yourself  0 0  Trouble concentrating 0 0  Moving slowly or fidgety/restless 1 1  Suicidal thoughts 0 0  PHQ-9 Score 4 7  Difficult doing work/chores Somewhat difficult Somewhat difficult   Fall Risk: Fall Risk  06/23/2018 05/20/2018  Falls in the past year? 0 0  Number falls in past yr: 0 0  Injury with Fall? 0 0  Follow up Falls evaluation completed Falls evaluation completed   Assessment & Plan  Problem List Items Addressed This Visit      Cardiovascular and Mediastinum   Essential hypertension    Doing well on losartan; off lisinopril due to cough.        Respiratory   Asthma    Continue seeing allergy and asthma        Other   Status post hysterectomy    Pap performed today, Cervix still intact.      Class 1 obesity due to excess calories with serious comorbidity and body mass index (BMI) of 33.0 to 33.9 in adult    Discussed importance of 150 minutes of physical activity weekly, eat two servings of fish weekly, eat one serving of tree nuts ( cashews, pistachios, pecans, almonds.Marland Kitchen) every other day, eat 6 servings of fruit/vegetables daily and drink plenty of water and avoid sweet beverages.        Hyperlipidemia    Lifestyle modifications reinforced.      Anxiety in acute stress reaction    Doing well on Buspar PRN, she is not wanting a daily SSRI/SNRI at this time.  Husband is still in the hospital, but has been moved to rehab floor.       Other Visit Diagnoses    Well woman exam (no gynecological exam)    -  Primary   Relevant Orders   HIV Antibody (routine testing w rflx)   Hemoglobin A1c   RPR   Cytology - PAP   Ambulatory referral to Gastroenterology   Routine screening for STI (sexually transmitted infection)       Relevant Orders   HIV Antibody (routine testing w rflx)   RPR   Cytology - PAP   Hyperglycemia       Relevant Orders   Hemoglobin A1c  Screening for colorectal  cancer       Relevant Orders   Ambulatory referral to Gastroenterology     -USPSTF grade A and B recommendations reviewed with patient; age-appropriate recommendations, preventive care, screening tests, etc discussed and encouraged; healthy living encouraged; see AVS for patient education given to patient

## 2018-06-23 NOTE — Assessment & Plan Note (Signed)
Continue seeing allergy and asthma

## 2018-06-24 ENCOUNTER — Encounter: Payer: Self-pay | Admitting: Family Medicine

## 2018-06-24 DIAGNOSIS — R7303 Prediabetes: Secondary | ICD-10-CM | POA: Insufficient documentation

## 2018-06-24 LAB — HEMOGLOBIN A1C
EAG (MMOL/L): 7.3 (calc)
HEMOGLOBIN A1C: 6.2 %{Hb} — AB (ref <5.7)
MEAN PLASMA GLUCOSE: 131 (calc)

## 2018-06-24 LAB — HIV ANTIBODY (ROUTINE TESTING W REFLEX): HIV: NONREACTIVE

## 2018-06-24 LAB — RPR: RPR Ser Ql: NONREACTIVE

## 2018-06-25 DIAGNOSIS — J3081 Allergic rhinitis due to animal (cat) (dog) hair and dander: Secondary | ICD-10-CM | POA: Diagnosis not present

## 2018-06-25 DIAGNOSIS — J3089 Other allergic rhinitis: Secondary | ICD-10-CM | POA: Diagnosis not present

## 2018-06-25 DIAGNOSIS — J301 Allergic rhinitis due to pollen: Secondary | ICD-10-CM | POA: Diagnosis not present

## 2018-06-25 LAB — CERVICOVAGINAL ANCILLARY ONLY
Chlamydia: NEGATIVE
NEISSERIA GONORRHEA: NEGATIVE

## 2018-06-26 ENCOUNTER — Other Ambulatory Visit: Payer: Self-pay

## 2018-06-26 DIAGNOSIS — Z1211 Encounter for screening for malignant neoplasm of colon: Secondary | ICD-10-CM

## 2018-06-26 LAB — CYTOLOGY - PAP
CHLAMYDIA, DNA PROBE: NEGATIVE
DIAGNOSIS: UNDETERMINED — AB
HPV: DETECTED — AB
Neisseria Gonorrhea: NEGATIVE

## 2018-06-26 MED ORDER — NA SULFATE-K SULFATE-MG SULF 17.5-3.13-1.6 GM/177ML PO SOLN: 1.0000 | Freq: Once | ORAL | 0 refills | Status: AC

## 2018-06-29 ENCOUNTER — Other Ambulatory Visit: Payer: Self-pay | Admitting: Family Medicine

## 2018-06-29 DIAGNOSIS — R87610 Atypical squamous cells of undetermined significance on cytologic smear of cervix (ASC-US): Secondary | ICD-10-CM | POA: Insufficient documentation

## 2018-06-29 DIAGNOSIS — R87810 Cervical high risk human papillomavirus (HPV) DNA test positive: Principal | ICD-10-CM

## 2018-07-01 ENCOUNTER — Telehealth: Payer: Self-pay

## 2018-07-01 NOTE — Telephone Encounter (Signed)
Patient contacted office to cancel her colonoscopy due to transportation issues.  Informed her that there would be a cancellation fee.  She stated that she may or may not pay the cancellation fee.  Discussed cancellation with office manager and she has agreed to waive the fee this time.  Awaiting for patient to call me back to reschedule.  Thanks [employer]

## 2018-07-01 NOTE — Telephone Encounter (Signed)
Patient contacted office to discuss rescheduling.  She informed me that her husband has been ill and she is having to take care of him and he can not be left alone and she relies on her daughters for transportation.  I thanked her for calling me back to discuss rescheduling and informed her that she can call me back when her husbands health has improved and things have settled down for her.  Thanks [employer]

## 2018-07-02 ENCOUNTER — Ambulatory Visit: Admission: RE | Admit: 2018-07-02 | Payer: 59 | Source: Home / Self Care | Admitting: Gastroenterology

## 2018-07-02 ENCOUNTER — Encounter: Admission: RE | Payer: Self-pay | Source: Home / Self Care

## 2018-07-02 SURGERY — COLONOSCOPY WITH PROPOFOL
Anesthesia: General

## 2018-07-07 ENCOUNTER — Telehealth: Payer: Self-pay | Admitting: Obstetrics & Gynecology

## 2018-07-07 NOTE — Telephone Encounter (Signed)
[facility] referring for ASCUS with positive high risk HPV cervical. Called and left voicemail for patient to call back to be schedule

## 2018-07-09 DIAGNOSIS — J3089 Other allergic rhinitis: Secondary | ICD-10-CM | POA: Diagnosis not present

## 2018-07-09 DIAGNOSIS — J3081 Allergic rhinitis due to animal (cat) (dog) hair and dander: Secondary | ICD-10-CM | POA: Diagnosis not present

## 2018-07-09 DIAGNOSIS — J301 Allergic rhinitis due to pollen: Secondary | ICD-10-CM | POA: Diagnosis not present

## 2018-07-22 MED FILL — LEVOCETIRIZINE 5 MG TABLET: 5 | 30 days supply | Qty: 30 | Fill #1

## 2018-07-28 ENCOUNTER — Encounter: Payer: Self-pay | Admitting: Obstetrics and Gynecology

## 2018-07-28 ENCOUNTER — Ambulatory Visit (INDEPENDENT_AMBULATORY_CARE_PROVIDER_SITE_OTHER): Payer: 59 | Admitting: Obstetrics and Gynecology

## 2018-07-28 ENCOUNTER — Other Ambulatory Visit: Payer: Self-pay

## 2018-07-28 ENCOUNTER — Other Ambulatory Visit (HOSPITAL_COMMUNITY)
Admission: RE | Admit: 2018-07-28 | Discharge: 2018-07-28 | Disposition: A | Discharge status: Home / Self Care | Payer: 59 | Source: Ambulatory Visit | Attending: Obstetrics and Gynecology | Admitting: Obstetrics and Gynecology

## 2018-07-28 VITALS — BP 124/78 | Ht 60.0 in | Wt 170.0 lb

## 2018-07-28 DIAGNOSIS — R87610 Atypical squamous cells of undetermined significance on cytologic smear of cervix (ASC-US): Secondary | ICD-10-CM

## 2018-07-28 DIAGNOSIS — N871 Moderate cervical dysplasia: Secondary | ICD-10-CM | POA: Diagnosis not present

## 2018-07-28 DIAGNOSIS — R87810 Cervical high risk human papillomavirus (HPV) DNA test positive: Secondary | ICD-10-CM | POA: Insufficient documentation

## 2018-07-28 DIAGNOSIS — D069 Carcinoma in situ of cervix, unspecified: Secondary | ICD-10-CM

## 2018-07-28 DIAGNOSIS — N870 Mild cervical dysplasia: Secondary | ICD-10-CM | POA: Diagnosis not present

## 2018-07-28 HISTORY — PX: COLPOSCOPY: SHX161

## 2018-07-28 NOTE — Progress Notes (Signed)
Referring Provider:  Raelyn Ensign, FNP, at [facility] for ASCUS, HPV+ pap smear  HPI:  Tracey Huff is a 48 y.o.  who presents today in referral from Raelyn Ensign, [place], at [facility] for evaluation and management of abnormal cervical cytology.    Dysplasia History:  ASCUS, HPV+ pap smear on 06/23/2018 History of total abdominal hysterectomy, final pathology shows cervix (does not indicate whether ectocervical tissue was present).  Six week post-op exam indicates no cervix. Pap smear result from 06/23/2018 shows endocervical/transformation zone PRESENT.   OB History  No obstetric history on file.    Past Medical History:  Diagnosis Date  . Allergy   . Asthma   . Fibroid uterus 11/15/2014  . Headache   . Hypertension   . Menorrhagia with irregular cycle 11/15/2014  . Shortness of breath dyspnea     Past Surgical History:  Procedure Laterality Date  . ABDOMINAL HYSTERECTOMY N/A 11/15/2014   Procedure: HYSTERECTOMY ABDOMINAL/BILATERAL SALPINGECTOMY ;  Surgeon: Will Bonnet, MD;  Location: [facility] ORS;  Service: Gynecology;  Laterality: N/A;  . CESAREAN SECTION    . CYSTOSCOPY N/A 11/15/2014   Procedure: CYSTOSCOPY;  Surgeon: Will Bonnet, MD;  Location: [facility] ORS;  Service: Gynecology;  Laterality: N/A;  . DILATION AND CURETTAGE OF UTERUS    . GANGLION CYST EXCISION Left   . UTERINE FIBROID SURGERY      SOCIAL HISTORY:  Social History   Substance and Sexual Activity  Alcohol Use Not Currently   Comment: occ    Social History   Substance and Sexual Activity  Drug Use No     Family History  Problem Relation Age of Onset  . Congestive Heart Failure Mother   . Diabetes Mother   . Hypertension Mother   . Diabetes Maternal Grandmother   . Congestive Heart Failure Maternal Grandfather     ALLERGIES:  Macadamia nut oil; Apple; Fruit & vegetable daily [nutritional supplements]; Kiwi extract; and Peach [prunus persica]  Current  Outpatient Medications on File Prior to Visit  Medication Sig Dispense Refill  . albuterol (PROVENTIL HFA;VENTOLIN HFA) 108 (90 BASE) MCG/ACT inhaler Inhale 1-2 puffs into the lungs every 6 (six) hours as needed for wheezing or shortness of breath.    . budesonide-formoterol (SYMBICORT) 160-4.5 MCG/ACT inhaler Inhale into the lungs.    . busPIRone (BUSPAR) 7.5 MG tablet Take 1 tablet every night. After 7 days, may take additional tablet once daily as needed for anxiety. 60 tablet 1  . EPINEPHrine 0.15 MG/0.15ML IJ injection Inject into the skin.    Marland Kitchen levocetirizine (XYZAL) 5 MG tablet Take 5 mg by mouth every evening.    Marland Kitchen losartan (COZAAR) 25 MG tablet Take 1 tablet (25 mg total) by mouth daily. 90 tablet 0  . montelukast (SINGULAIR) 10 MG tablet Take by mouth.     No current facility-administered medications on file prior to visit.     Physical Exam: -Vitals:  BP 124/78   Ht 5' (1.524 m)   Wt 170 lb (77.1 kg)   LMP 11/15/2014   BMI 33.20 kg/m  GEN: WD, WN, NAD.  A+ O x 3, good mood and affect. ABD:  NT, ND.  Soft, no masses.  No hernias noted.   Pelvic:   Vulva: Normal appearance.  No lesions.  Vagina: No lesions or abnormalities noted.  Support: Normal pelvic support.  Urethra No masses tenderness or scarring.  Meatus Normal size without lesions or prolapse.  Cervix: See  below.  Anus: Normal exam.  No lesions.  Perineum: Normal exam.  No lesions.        Bimanual   Uterus: Normal size.  Non-tender.  Mobile.  AV.  Adnexae: No masses.  Non-tender to palpation.  Cul-de-sac: Negative for abnormality.   PROCEDURE: 1.  Urine Pregnancy Test:  not done (status post hysterectomy) 2.  Colposcopy performed with 4% acetic acid after verbal consent obtained                           - Cervix-like tissue noted in the shape of a cervix. Bimanual performed. No definitive cervical tissue palpated, though some small amount of firm tissue noted.               -Aceto-white Lesions  Location(s): none.  - Lugol's solution applied with lugol's negative areas noted (see image)              -Biopsy performed at 5 and 8 o'clock               -ECC indicated and performed: Yes.       -Biopsy sites made hemostatic with pressure, AgNO3, and/or Monsel's solution   -Satisfactory colposcopy: No.    -Evidence of Invasive cervical CA :  NO Physical Exam Genitourinary:        ASSESSMENT:  Tracey Huff is a 48 y.o. No obstetric history on file. here for  1. ASCUS with positive high risk HPV cervical   .  PLAN: 1.  I discussed the grading system of pap smears and HPV high risk viral types.  We will discuss and base management after colpo results return. 2. Will follow up as indicated. Will try to get more definitive information about presence or absence of cervical tissue from pathology.      Prentice Docker, MD  [facility], [facility] 07/28/2018  11:18 AM   CC: Hubbard Hartshorn, [facility] [address] [place], [place] [address]

## 2018-07-30 DIAGNOSIS — J301 Allergic rhinitis due to pollen: Secondary | ICD-10-CM | POA: Diagnosis not present

## 2018-07-30 DIAGNOSIS — J3081 Allergic rhinitis due to animal (cat) (dog) hair and dander: Secondary | ICD-10-CM | POA: Diagnosis not present

## 2018-07-30 DIAGNOSIS — J3089 Other allergic rhinitis: Secondary | ICD-10-CM | POA: Diagnosis not present

## 2018-08-11 ENCOUNTER — Telehealth: Payer: Self-pay | Admitting: Obstetrics and Gynecology

## 2018-08-11 ENCOUNTER — Other Ambulatory Visit: Payer: Self-pay | Admitting: Obstetrics and Gynecology

## 2018-08-11 DIAGNOSIS — D069 Carcinoma in situ of cervix, unspecified: Secondary | ICD-10-CM

## 2018-08-11 DIAGNOSIS — Z90710 Acquired absence of both cervix and uterus: Secondary | ICD-10-CM

## 2018-08-11 NOTE — Telephone Encounter (Signed)
-----   Message from Will Bonnet, MD sent at 08/11/2018 11:22 AM EDT ----- Regarding: Schedule pelvic ultrasound Please contact and schedule this patient for a pelvic ultrasound (order has been placed). I will give her a call afterward to discuss. Thank you! Prentice Docker, MD

## 2018-08-11 NOTE — Telephone Encounter (Signed)
Patient is schedule 08/17/18

## 2018-08-11 NOTE — Telephone Encounter (Signed)
Discussed findings on colposcopy biopsy results.  My suspicion is that she has a cervical remnant that was left, somehow, after her hysterectomy.  We discussed obtaining a pelvic ultrasound to better assess whether there is any cervical tissue remaining. We also discussed having her see gyn onc to get further recommendations for removing this tissue.  She voiced understanding of my explanation of the circumstance and agreement to proceed with the ultrasound and possible gyn onc referral.

## 2018-08-17 ENCOUNTER — Ambulatory Visit: Payer: 59

## 2018-08-20 ENCOUNTER — Ambulatory Visit: Payer: 59

## 2018-08-20 DIAGNOSIS — J3089 Other allergic rhinitis: Secondary | ICD-10-CM | POA: Diagnosis not present

## 2018-08-20 DIAGNOSIS — J3081 Allergic rhinitis due to animal (cat) (dog) hair and dander: Secondary | ICD-10-CM | POA: Diagnosis not present

## 2018-08-20 DIAGNOSIS — J301 Allergic rhinitis due to pollen: Secondary | ICD-10-CM | POA: Diagnosis not present

## 2018-09-03 DIAGNOSIS — J4530 Mild persistent asthma, uncomplicated: Secondary | ICD-10-CM | POA: Diagnosis not present

## 2018-09-03 DIAGNOSIS — J3081 Allergic rhinitis due to animal (cat) (dog) hair and dander: Secondary | ICD-10-CM | POA: Diagnosis not present

## 2018-09-03 DIAGNOSIS — H1045 Other chronic allergic conjunctivitis: Secondary | ICD-10-CM | POA: Diagnosis not present

## 2018-09-03 DIAGNOSIS — J3089 Other allergic rhinitis: Secondary | ICD-10-CM | POA: Diagnosis not present

## 2018-09-03 DIAGNOSIS — J301 Allergic rhinitis due to pollen: Secondary | ICD-10-CM | POA: Diagnosis not present

## 2018-09-04 MED FILL — FLUTICASONE PROP 50 MCG SPR: 50 | 30 days supply | Qty: 16 | Fill #0

## 2018-09-04 MED FILL — AZELASTINE HCL 137 MCG SPRY: 0.1 | 30 days supply | Qty: 30 | Fill #0

## 2018-09-17 DIAGNOSIS — J3081 Allergic rhinitis due to animal (cat) (dog) hair and dander: Secondary | ICD-10-CM | POA: Diagnosis not present

## 2018-09-17 DIAGNOSIS — J301 Allergic rhinitis due to pollen: Secondary | ICD-10-CM | POA: Diagnosis not present

## 2018-09-17 DIAGNOSIS — R922 Inconclusive mammogram: Secondary | ICD-10-CM | POA: Diagnosis not present

## 2018-09-17 DIAGNOSIS — N6091 Unspecified benign mammary dysplasia of right breast: Secondary | ICD-10-CM | POA: Diagnosis not present

## 2018-09-17 DIAGNOSIS — J3089 Other allergic rhinitis: Secondary | ICD-10-CM | POA: Diagnosis not present

## 2018-09-17 DIAGNOSIS — N6099 Unspecified benign mammary dysplasia of unspecified breast: Secondary | ICD-10-CM | POA: Diagnosis not present

## 2018-10-01 DIAGNOSIS — J301 Allergic rhinitis due to pollen: Secondary | ICD-10-CM | POA: Diagnosis not present

## 2018-10-01 DIAGNOSIS — J3089 Other allergic rhinitis: Secondary | ICD-10-CM | POA: Diagnosis not present

## 2018-10-01 DIAGNOSIS — J3081 Allergic rhinitis due to animal (cat) (dog) hair and dander: Secondary | ICD-10-CM | POA: Diagnosis not present

## 2018-10-23 DIAGNOSIS — J3089 Other allergic rhinitis: Secondary | ICD-10-CM | POA: Diagnosis not present

## 2018-10-23 DIAGNOSIS — J301 Allergic rhinitis due to pollen: Secondary | ICD-10-CM | POA: Diagnosis not present

## 2018-11-03 DIAGNOSIS — J3081 Allergic rhinitis due to animal (cat) (dog) hair and dander: Secondary | ICD-10-CM | POA: Diagnosis not present

## 2018-11-03 DIAGNOSIS — J3089 Other allergic rhinitis: Secondary | ICD-10-CM | POA: Diagnosis not present

## 2018-11-03 DIAGNOSIS — J301 Allergic rhinitis due to pollen: Secondary | ICD-10-CM | POA: Diagnosis not present

## 2018-11-12 DIAGNOSIS — J3089 Other allergic rhinitis: Secondary | ICD-10-CM | POA: Diagnosis not present

## 2018-11-12 DIAGNOSIS — J3081 Allergic rhinitis due to animal (cat) (dog) hair and dander: Secondary | ICD-10-CM | POA: Diagnosis not present

## 2018-11-12 DIAGNOSIS — J301 Allergic rhinitis due to pollen: Secondary | ICD-10-CM | POA: Diagnosis not present

## 2018-11-17 DIAGNOSIS — J301 Allergic rhinitis due to pollen: Secondary | ICD-10-CM | POA: Diagnosis not present

## 2018-11-17 DIAGNOSIS — J3081 Allergic rhinitis due to animal (cat) (dog) hair and dander: Secondary | ICD-10-CM | POA: Diagnosis not present

## 2018-11-17 DIAGNOSIS — J3089 Other allergic rhinitis: Secondary | ICD-10-CM | POA: Diagnosis not present

## 2018-11-24 DIAGNOSIS — J3089 Other allergic rhinitis: Secondary | ICD-10-CM | POA: Diagnosis not present

## 2018-11-24 DIAGNOSIS — J3081 Allergic rhinitis due to animal (cat) (dog) hair and dander: Secondary | ICD-10-CM | POA: Diagnosis not present

## 2018-11-24 DIAGNOSIS — J301 Allergic rhinitis due to pollen: Secondary | ICD-10-CM | POA: Diagnosis not present

## 2018-12-10 DIAGNOSIS — J301 Allergic rhinitis due to pollen: Secondary | ICD-10-CM | POA: Diagnosis not present

## 2018-12-10 DIAGNOSIS — J3081 Allergic rhinitis due to animal (cat) (dog) hair and dander: Secondary | ICD-10-CM | POA: Diagnosis not present

## 2018-12-10 DIAGNOSIS — J3089 Other allergic rhinitis: Secondary | ICD-10-CM | POA: Diagnosis not present

## 2018-12-17 ENCOUNTER — Ambulatory Visit (INDEPENDENT_AMBULATORY_CARE_PROVIDER_SITE_OTHER): Payer: 59

## 2018-12-17 ENCOUNTER — Other Ambulatory Visit: Payer: Self-pay

## 2018-12-17 ENCOUNTER — Ambulatory Visit (INDEPENDENT_AMBULATORY_CARE_PROVIDER_SITE_OTHER): Payer: 59 | Admitting: Obstetrics and Gynecology

## 2018-12-17 ENCOUNTER — Encounter: Payer: Self-pay | Admitting: Obstetrics and Gynecology

## 2018-12-17 VITALS — BP 127/78 | Wt 173.0 lb

## 2018-12-17 DIAGNOSIS — J3089 Other allergic rhinitis: Secondary | ICD-10-CM | POA: Diagnosis not present

## 2018-12-17 DIAGNOSIS — Z90710 Acquired absence of both cervix and uterus: Secondary | ICD-10-CM

## 2018-12-17 DIAGNOSIS — D069 Carcinoma in situ of cervix, unspecified: Secondary | ICD-10-CM

## 2018-12-17 DIAGNOSIS — J301 Allergic rhinitis due to pollen: Secondary | ICD-10-CM | POA: Diagnosis not present

## 2018-12-17 DIAGNOSIS — J3081 Allergic rhinitis due to animal (cat) (dog) hair and dander: Secondary | ICD-10-CM | POA: Diagnosis not present

## 2018-12-17 NOTE — Progress Notes (Signed)
Gynecology Ultrasound Follow Up   Chief Complaint  Patient presents with  . Follow-up  Ultrasound for possible cervical remnant and CIN 2-3 on colposcopy   History of Present Illness: Patient is a 48 y.o. female who presents today for ultrasound evaluation of the above .  Ultrasound demonstrates the following findings Adnexa: no masses noted. Only one ovary definitively noted in the midline Uterus: surgically absent  Additional: no definitive cervix noted.  Hyperechoic density noted between vaginal wall and bladder of uncertain significance.  It appears well circumscribed.   No symptoms  Past Medical History:  Diagnosis Date  . Allergy   . Asthma   . Fibroid uterus 11/15/2014  . Headache   . Hypertension   . Menorrhagia with irregular cycle 11/15/2014  . Shortness of breath dyspnea     Past Surgical History:  Procedure Laterality Date  . ABDOMINAL HYSTERECTOMY N/A 11/15/2014   Procedure: HYSTERECTOMY ABDOMINAL/BILATERAL SALPINGECTOMY ;  Surgeon: Will Bonnet, MD;  Location: [facility] ORS;  Service: Gynecology;  Laterality: N/A;  . CESAREAN SECTION    . CYSTOSCOPY N/A 11/15/2014   Procedure: CYSTOSCOPY;  Surgeon: Will Bonnet, MD;  Location: [facility] ORS;  Service: Gynecology;  Laterality: N/A;  . DILATION AND CURETTAGE OF UTERUS    . GANGLION CYST EXCISION Left   . UTERINE FIBROID SURGERY      Family History  Problem Relation Age of Onset  . Congestive Heart Failure Mother   . Diabetes Mother   . Hypertension Mother   . Diabetes Maternal Grandmother   . Congestive Heart Failure Maternal Grandfather     Social History   Socioeconomic History  . Marital status: Married    Spouse name: Fritz Pickerel  . Number of children: 2  . Years of education: Not on file  . Highest education level: Not on file  Occupational History  . Not on file  Social Needs  . Financial resource strain: Not on file  . Food insecurity    Worry: Never true    Inability: Never true  .  Transportation needs    Medical: No    Non-medical: No  Tobacco Use  . Smoking status: Never Smoker  . Smokeless tobacco: Never Used  Substance and Sexual Activity  . Alcohol use: Not Currently    Comment: occ  . Drug use: No  . Sexual activity: Yes    Partners: Male    Birth control/protection: Surgical  Lifestyle  . Physical activity    Days per week: 0 days    Minutes per session: 0 min  . Stress: Not at all  Relationships  . Social connections    Talks on phone: More than three times a week    Gets together: More than three times a week    Attends religious service: More than 4 times per year    Active member of club or organization: No    Attends meetings of clubs or organizations: Never    Relationship status: Married  . Intimate partner violence    Fear of current or ex partner: No    Emotionally abused: No    Physically abused: No    Forced sexual activity: No  Other Topics Concern  . Not on file  Social History Narrative  . Not on file    Allergies  Allergen Reactions  . Macadamia Nut Oil Shortness Of Breath  . Apple Nausea And Vomiting  . Fruit & Vegetable Daily [Nutritional Supplements] Nausea And Vomiting  Cannot tolerate apples, peaches, plums, and nectarines  . Kiwi Extract Nausea And Vomiting  . Peach [Prunus Persica] Nausea And Vomiting    Prior to Admission medications   Medication Sig Start Date End Date Taking? Authorizing Provider  albuterol (PROVENTIL HFA;VENTOLIN HFA) 108 (90 BASE) MCG/ACT inhaler Inhale 1-2 puffs into the lungs every 6 (six) hours as needed for wheezing or shortness of breath.    [provider]  budesonide-formoterol (SYMBICORT) 160-4.5 MCG/ACT inhaler Inhale into the lungs.    [provider]  busPIRone (BUSPAR) 7.5 MG tablet Take 1 tablet every night. After 7 days, may take additional tablet once daily as needed for anxiety. 05/20/18   Hubbard Hartshorn, FNP  EPINEPHrine 0.15 MG/0.15ML IJ injection Inject  into the skin.    [provider]  levocetirizine (XYZAL) 5 MG tablet Take 5 mg by mouth every evening.    [provider]  losartan (COZAAR) 25 MG tablet Take 1 tablet (25 mg total) by mouth daily. 05/20/18   Hubbard Hartshorn, FNP  montelukast (SINGULAIR) 10 MG tablet Take by mouth.    [provider]    Physical Exam BP 127/78   Wt 173 lb (78.5 kg)   LMP 11/15/2014   BMI 33.79 kg/m    General: NAD HEENT: normocephalic, anicteric Pulmonary: No increased work of breathing Extremities: no edema, erythema, or tenderness Neurologic: Grossly intact, normal gait Psychiatric: mood appropriate, affect full  Imaging Results US Pelvis Transvanginal Non-ob (tv Only)  Result Date: 12/17/2018 Patient Name: Tracey Huff DOB: [birth year] MRN: [id number] ULTRASOUND REPORT Location: [facility] Date of Service: 12/17/2018 Indications: check for a cervix history of a hysterectomy Findings: The uterus is absent. The cervix is absent There is one ovary seen in the midline pelvis measuring 2.9 x 1.9 x 1.7 cm Survey of the adnexa demonstrates no adnexal masses. There is no free fluid in the cul de sac. Hyperechoic lesion noted of uncertain significance. Impression: 1. Hysterectomy, the cervix has been removed. 2. One ovary is seen midline pelvis. Normal appearance. 3. Hyperechoic lesion noted near vagina close to bladder of uncertain significance. Recommendations:  Clinical correlation with the patient's History and Physical Exam. Gweneth Dimitri, RT The ultrasound images and findings were reviewed by me and I agree with the above report. Prentice Docker, MD, [facility], [facility] 12/17/2018 8:55 AM       Assessment: 48 y.o. No obstetric history on file.  1. CIN III (cervical intraepithelial neoplasia grade III) with severe dysplasia      Plan: Problem List Items Addressed This Visit      Genitourinary   CIN III (cervical intraepithelial neoplasia  grade III) with severe dysplasia - Primary   Relevant Orders   Ambulatory referral to Oncology     Ultrasound shows no clear cervical tissue.  There is a hyperechoic area.  However, this is inconsistent with typical sonographic cervical tissue.  Given CIN 3 on biopsy, there is likelihood that cervical tissue ache still exists.  Given the complexity of her circumstance and CIN-3, will refer to GYN oncology for treatment and recommendations.  15 minutes spent in face to face discussion with > 50% spent in counseling,management, and coordination of care of her CIN III.  Prentice Docker, MD, [facility], [facility] 12/17/2018 9:23 AM

## 2018-12-22 ENCOUNTER — Ambulatory Visit: Payer: 59 | Admitting: Family Medicine

## 2018-12-28 ENCOUNTER — Encounter: Payer: Self-pay | Admitting: Family Medicine

## 2018-12-28 ENCOUNTER — Ambulatory Visit: Payer: Self-pay | Admitting: *Deleted

## 2018-12-28 ENCOUNTER — Other Ambulatory Visit: Payer: Self-pay

## 2018-12-28 ENCOUNTER — Ambulatory Visit: Payer: 59 | Admitting: Family Medicine

## 2018-12-28 VITALS — BP 130/84 | HR 78 | Temp 97.3°F | Resp 16 | Ht 60.0 in | Wt 172.1 lb

## 2018-12-28 DIAGNOSIS — I10 Essential (primary) hypertension: Secondary | ICD-10-CM

## 2018-12-28 DIAGNOSIS — J45909 Unspecified asthma, uncomplicated: Secondary | ICD-10-CM

## 2018-12-28 DIAGNOSIS — Z6833 Body mass index (BMI) 33.0-33.9, adult: Secondary | ICD-10-CM

## 2018-12-28 DIAGNOSIS — Z636 Dependent relative needing care at home: Secondary | ICD-10-CM

## 2018-12-28 DIAGNOSIS — F411 Generalized anxiety disorder: Secondary | ICD-10-CM | POA: Diagnosis not present

## 2018-12-28 DIAGNOSIS — E785 Hyperlipidemia, unspecified: Secondary | ICD-10-CM

## 2018-12-28 DIAGNOSIS — F430 Acute stress reaction: Secondary | ICD-10-CM

## 2018-12-28 DIAGNOSIS — D069 Carcinoma in situ of cervix, unspecified: Secondary | ICD-10-CM

## 2018-12-28 DIAGNOSIS — R7303 Prediabetes: Secondary | ICD-10-CM

## 2018-12-28 DIAGNOSIS — E6609 Other obesity due to excess calories: Secondary | ICD-10-CM

## 2018-12-28 MED ORDER — BUSPIRONE HCL 7.5 MG PO TABS: ORAL_TABLET | ORAL | 1 refills | Status: DC

## 2018-12-28 MED ORDER — LOSARTAN POTASSIUM 25 MG PO TABS: 12.5000 mg | ORAL_TABLET | Freq: Every day | ORAL | 1 refills | Status: DC

## 2018-12-28 MED FILL — LOSARTAN POTASSIUM 25 MG TA: 25 | 30 days supply | Qty: 15 | Fill #0

## 2018-12-28 MED FILL — busPIRone HCL 7.5 MG TABS: 7.5 | 90 days supply | Qty: 180 | Fill #0

## 2018-12-28 NOTE — Patient Instructions (Signed)
Thank you allowing the Chronic Care Management Team to be a part of your care! It was a pleasure speaking with you today!  Tracey Huff was given information about Chronic Care Management services today including:  1. CM service includes personalized support from designated clinical staff supervised by her physician, including individualized plan of care and coordination with other care providers 2. 24/7 contact phone numbers for assistance for urgent and routine care needs. 3. The patient may stop CCM services at any time (effective at the end of the month) by phone call to the office staff.   CCM (Chronic Care Management) Team   Trish Fountain RN, BSN Nurse Care Coordinator  [phone]  Ruben Reason PharmD  Clinical Pharmacist  [phone]   [place], LCSW Clinical Social Worker [phone]  Goals Addressed   None      The patient verbalized understanding of instructions provided today and declined a print copy of patient instruction materials.   Telephone follow up appointment with care management team member scheduled for:12/30/18 at 2:30pm

## 2018-12-28 NOTE — Progress Notes (Signed)
Name: Tracey Huff   MRN: [id number]    DOB: 1971/03/02   Date:12/28/2018       Progress Note  Subjective  Chief Complaint  Chief Complaint  Patient presents with  . Follow-up    HPI  Anxiety/Stress/Caregiver Burden: Her husband is in the hospital with seizures again - doing well and in the rehab area right now.  He does have a brain tumor on the right side - she is his caregiver at home and this can be a source of stress.  We will refer to CCM LCSW to evaluate care options at home.  She has been taking buspar and this helps her to sleep, often she sleeps without taking the medication. She has had 5 other family members who have passed away in the last 2 years.  Anxiety  Is under   HTN: We stopped lisinopril due to allergist suggesting stopping due to chronic cough, taking losartan 12.5mg  and doing well on this.  Her cough has improved as well.  At work she has seen BP's in the 130's/70's. Occasional headaches; denies chest pain, shortness of breath, palpitations, no BLE edema. At goal today; no changes at this time.  Asthma & Allergties: She is seeing allergy and asthma for care at this time - getting shots once weekly injections. She is taking symbicort, singulair, Xyzal, and albuterol as prescribed. Her asthma has been poorly controlled in the past, but has been doing okay right now - she notes many triggers. She has been very stable on this.   HLD: Was prescribed 10mg  Atorvastatin, but didn't like the research she found on it. Discussed HLD diet.   Obesity/Prediabetes: She was walking regularly for a while, but the gyms shut down for a bit and this caused her to have to take a break.  Going right from work to be with her husband currently.  She is eating fast food frequently.  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  Denies polydipsia, polyphagia, polyuria.  CINIII : Seeing Dr. Glennon Mac; going to see GYN Onc  this week to be evaluated further.    Patient Active Problem List   Diagnosis Date Noted  . CIN III (cervical intraepithelial neoplasia grade III) with severe dysplasia 08/11/2018  . Atypical squamous cell changes of undetermined significance (ASCUS) on cervical cytology with positive high risk human papilloma virus (HPV) 06/29/2018  . Prediabetes 06/24/2018  . Class 1 obesity due to excess calories with serious comorbidity and body mass index (BMI) of 33.0 to 33.9 in adult 05/20/2018  . Essential hypertension 05/20/2018  . Hyperlipidemia 05/20/2018  . Asthma 05/20/2018  . Anxiety in acute stress reaction 05/20/2018  . Status post hysterectomy 11/15/2014    Past Surgical History:  Procedure Laterality Date  . ABDOMINAL HYSTERECTOMY N/A 11/15/2014   Procedure: HYSTERECTOMY ABDOMINAL/BILATERAL SALPINGECTOMY ;  Surgeon: Will Bonnet, MD;  Location: [facility] ORS;  Service: Gynecology;  Laterality: N/A;  . CESAREAN SECTION    . CYSTOSCOPY N/A 11/15/2014   Procedure: CYSTOSCOPY;  Surgeon: Will Bonnet, MD;  Location: [facility] ORS;  Service: Gynecology;  Laterality: N/A;  . DILATION AND CURETTAGE OF UTERUS    . GANGLION CYST EXCISION Left   . UTERINE FIBROID SURGERY      Family History  Problem Relation Age of Onset  . Congestive Heart Failure Mother   . Diabetes Mother   . Hypertension Mother   . Diabetes  Maternal Grandmother   . Congestive Heart Failure Maternal Grandfather     Social History   Socioeconomic History  . Marital status: Married    Spouse name: Fritz Pickerel  . Number of children: 2  . Years of education: Not on file  . Highest education level: Not on file  Occupational History  . Not on file  Social Needs  . Financial resource strain: Not on file  . Food insecurity    Worry: Never true    Inability: Never true  . Transportation needs    Medical: No    Non-medical: No  Tobacco Use  . Smoking status: Never Smoker  . Smokeless tobacco: Never Used  Substance and  Sexual Activity  . Alcohol use: Not Currently    Comment: occ  . Drug use: No  . Sexual activity: Yes    Partners: Male    Birth control/protection: Surgical  Lifestyle  . Physical activity    Days per week: 0 days    Minutes per session: 0 min  . Stress: Not at all  Relationships  . Social connections    Talks on phone: More than three times a week    Gets together: More than three times a week    Attends religious service: More than 4 times per year    Active member of club or organization: No    Attends meetings of clubs or organizations: Never    Relationship status: Married  . Intimate partner violence    Fear of current or ex partner: No    Emotionally abused: No    Physically abused: No    Forced sexual activity: No  Other Topics Concern  . Not on file  Social History Narrative  . Not on file     Current Outpatient Medications:  .  albuterol (PROVENTIL HFA;VENTOLIN HFA) 108 (90 BASE) MCG/ACT inhaler, Inhale 1-2 puffs into the lungs every 6 (six) hours as needed for wheezing or shortness of breath., Disp: , Rfl:  .  budesonide-formoterol (SYMBICORT) 160-4.5 MCG/ACT inhaler, Inhale into the lungs., Disp: , Rfl:  .  busPIRone (BUSPAR) 7.5 MG tablet, Take 1 tablet every night. After 7 days, may take additional tablet once daily as needed for anxiety., Disp: 60 tablet, Rfl: 1 .  EPINEPHrine 0.15 MG/0.15ML IJ injection, Inject into the skin., Disp: , Rfl:  .  levocetirizine (XYZAL) 5 MG tablet, Take 5 mg by mouth every evening., Disp: , Rfl:  .  losartan (COZAAR) 25 MG tablet, Take 1 tablet (25 mg total) by mouth daily., Disp: 90 tablet, Rfl: 0 .  montelukast (SINGULAIR) 10 MG tablet, Take by mouth., Disp: , Rfl:   Allergies  Allergen Reactions  . Macadamia Nut Oil Shortness Of Breath  . Apple Nausea And Vomiting  . Fruit & Vegetable Daily [Nutritional Supplements] Nausea And Vomiting    Cannot tolerate apples, peaches, plums, and nectarines  . Kiwi Extract Nausea And  Vomiting  . Peach [Prunus Persica] Nausea And Vomiting    I personally reviewed active problem list, medication list, allergies, health maintenance, notes from last encounter, lab results with the patient/caregiver today.   ROS Constitutional: Negative for fever or weight change.  Respiratory: Negative for cough and shortness of breath.   Cardiovascular: Negative for chest pain or palpitations.  Gastrointestinal: Negative for abdominal pain, no bowel changes.  Musculoskeletal: Negative for gait problem or joint swelling.  Skin: Negative for rash.  Neurological: Negative for dizziness or headache.  No other specific complaints in  a complete review of systems (except as listed in HPI above).  Objective  Vitals:   12/28/18 0830  BP: 130/84  Pulse: 78  Resp: 16  Temp: (!) 97.3 F (36.3 C)  TempSrc: Oral  SpO2: 99%  Weight: 172 lb 1.6 oz (78.1 kg)  Height: 5' (1.524 m)   Body mass index is 33.61 kg/m.  Physical Exam  Constitutional: Patient appears well-developed and well-nourished. No distress.  HENT: Head: Normocephalic and atraumatic.  Eyes: Conjunctivae and EOM are normal. No scleral icterus. Neck: Normal range of motion. Neck supple. No JVD present. No thyromegaly present.  Cardiovascular: Normal rate, regular rhythm and normal heart sounds.  No murmur heard. No BLE edema. Pulmonary/Chest: Effort normal and breath sounds normal. No respiratory distress. Musculoskeletal: Normal range of motion, no joint effusions. No gross deformities Neurological: Pt is alert and oriented to person, place, and time. No cranial nerve deficit. Coordination, balance, strength, speech and gait are normal.  Skin: Skin is warm and dry. No rash noted. No erythema.  Psychiatric: Patient has a normal mood and affect. behavior is normal. Judgment and thought content normal.  No results found for this or any previous visit (from the past 72 hour(s)).  PHQ2/9: Depression screen [facility] 2/9 12/28/2018  06/23/2018 05/20/2018  Decreased Interest 0 0 1  Down, Depressed, Hopeless 0 0 1  PHQ - 2 Score 0 0 2  Altered sleeping 0 2 2  Tired, decreased energy 0 0 0  Change in appetite 0 1 2  Feeling bad or failure about yourself  0 0 0  Trouble concentrating 0 0 0  Moving slowly or fidgety/restless 0 1 1  Suicidal thoughts 0 0 0  PHQ-9 Score 0 4 7  Difficult doing work/chores Not difficult at all Somewhat difficult Somewhat difficult   PHQ-2/9 Result is negative.    Fall Risk: Fall Risk  12/28/2018 06/23/2018 05/20/2018  Falls in the past year? 0 0 0  Number falls in past yr: 0 0 0  Injury with Fall? 0 0 0  Follow up Falls evaluation completed Falls evaluation completed Falls evaluation completed    Assessment & Plan  1. Anxiety in acute stress reaction - Ambulatory referral to Chronic Care Management Services - busPIRone (BUSPAR) 7.5 MG tablet; Take 1 tablet once daily at night; may take 1 additional tablet daily if needed for anxiety.  Dispense: 180 tablet; Refill: 1  2. Caregiver burden - Ambulatory referral to Chronic Care Management Services - busPIRone (BUSPAR) 7.5 MG tablet; Take 1 tablet once daily at night; may take 1 additional tablet daily if needed for anxiety.  Dispense: 180 tablet; Refill: 1  3. Essential hypertension - losartan (COZAAR) 25 MG tablet; Take 0.5 tablets (12.5 mg total) by mouth daily.  Dispense: 90 tablet; Refill: 1  4. Asthma, unspecified asthma severity, unspecified whether complicated, unspecified whether persistent - Taking medications, stable, seeing allergist  5. Hyperlipidemia, unspecified hyperlipidemia type - Lipid panel  6. Class 1 obesity due to excess calories with serious comorbidity and body mass index (BMI) of 33.0 to 33.9 in adult - Discussed importance of 150 minutes of physical activity weekly, eat two servings of fish weekly, eat one serving of tree nuts ( cashews, pistachios, pecans, almonds.Marland Kitchen) every other day, eat 6 servings of  fruit/vegetables daily and drink plenty of water and avoid sweet beverages.  - Hemoglobin A1c - COMPLETE METABOLIC PANEL WITH GFR  7. Prediabetes - Hemoglobin A1c - COMPLETE METABOLIC PANEL WITH GFR  8. CIN III (  cervical intraepithelial neoplasia grade III) with severe dysplasia - Seeing GYN ONC this week.

## 2018-12-28 NOTE — Chronic Care Management (AMB) (Signed)
   Care Management    Clinical Social Work General Note  12/28/2018 Name: Tracey Huff MRN: [id number] DOB: [birth year]  Tracey Huff is a 48 y.o. year old female who is a primary care patient of Tracey Hartshorn, FNP. The CCM was consulted to assist the patient with Caregiver Stress.   Tracey Huff was given information about Chronic Care Management services today including:  1. CM service includes personalized support from designated clinical staff supervised by her physician, including individualized plan of care and coordination with other care providers 2. 24/7 contact phone numbers for assistance for urgent and routine care needs. 3. The patient may stop CCM services at any time (effective at the end of the month) by phone call to the office staff.  Patient agreed to services and verbal consent obtained.   Review of patient status, including review of consultants reports, relevant laboratory and other test results, and collaboration with appropriate care team members and the patient's provider was performed as part of comprehensive patient evaluation and provision of chronic care management services.     Outpatient Encounter Medications as of 12/28/2018  Medication Sig  . albuterol (PROVENTIL HFA;VENTOLIN HFA) 108 (90 BASE) MCG/ACT inhaler Inhale 1-2 puffs into the lungs every 6 (six) hours as needed for wheezing or shortness of breath.  . budesonide-formoterol (SYMBICORT) 160-4.5 MCG/ACT inhaler Inhale into the lungs.  . busPIRone (BUSPAR) 7.5 MG tablet Take 1 tablet once daily at night; may take 1 additional tablet daily if needed for anxiety.  Marland Kitchen EPINEPHrine 0.15 MG/0.15ML IJ injection Inject into the skin.  Marland Kitchen levocetirizine (XYZAL) 5 MG tablet Take 5 mg by mouth every evening.  Marland Kitchen losartan (COZAAR) 25 MG tablet Take 0.5 tablets (12.5 mg total) by mouth daily.  . montelukast (SINGULAIR) 10 MG tablet Take by mouth.   No facility-administered encounter medications on file as of  12/28/2018.     Goals Addressed   None      Follow Up Plan: Appointment scheduled for SW follow up with client by phone on: 12/30/18 at 2:30pm      Elliot Gurney, [place] Worker  [facility] [phone]

## 2018-12-29 LAB — COMPLETE METABOLIC PANEL WITH GFR
AG Ratio: 1.4 (calc) (ref 1.0–2.5)
ALT: 12 U/L (ref 6–29)
AST: 14 U/L (ref 10–35)
Albumin: 4.1 g/dL (ref 3.6–5.1)
Alkaline phosphatase (APISO): 42 U/L (ref 31–125)
BUN: 14 mg/dL (ref 7–25)
CO2: 26 mmol/L (ref 20–32)
Calcium: 9.4 mg/dL (ref 8.6–10.2)
Chloride: 104 mmol/L (ref 98–110)
Creat: 0.93 mg/dL (ref 0.50–1.10)
GFR, Est African American: 84 mL/min/{1.73_m2} (ref >=60)
GFR, Est Non African American: 73 mL/min/{1.73_m2} (ref >=60)
Globulin: 3 g/dL (calc) (ref 1.9–3.7)
Glucose, Bld: 98 mg/dL (ref 65–99)
Potassium: 4.1 mmol/L (ref 3.5–5.3)
Sodium: 136 mmol/L (ref 135–146)
Total Bilirubin: 0.2 mg/dL (ref 0.2–1.2)
Total Protein: 7.1 g/dL (ref 6.1–8.1)

## 2018-12-29 LAB — LIPID PANEL
Cholesterol: 243 mg/dL — ABNORMAL HIGH (ref <200)
HDL: 57 mg/dL (ref >=50)
LDL Cholesterol (Calc): 163 mg/dL (calc) — ABNORMAL HIGH
Non-HDL Cholesterol (Calc): 186 mg/dL (calc) — ABNORMAL HIGH (ref <130)
Total CHOL/HDL Ratio: 4.3 (calc) (ref <5.0)
Triglycerides: 111 mg/dL (ref <150)

## 2018-12-29 LAB — HEMOGLOBIN A1C
Hgb A1c MFr Bld: 6.3 % of total Hgb — ABNORMAL HIGH (ref <5.7)
Mean Plasma Glucose: 134 (calc)
eAG (mmol/L): 7.4 (calc)

## 2018-12-30 ENCOUNTER — Inpatient Hospital Stay: Payer: 59 | Attending: Obstetrics and Gynecology | Admitting: Obstetrics and Gynecology

## 2018-12-30 ENCOUNTER — Encounter: Payer: Self-pay | Admitting: *Deleted

## 2018-12-30 ENCOUNTER — Other Ambulatory Visit: Payer: Self-pay

## 2018-12-30 ENCOUNTER — Ambulatory Visit: Payer: Self-pay | Admitting: *Deleted

## 2018-12-30 VITALS — BP 135/88 | HR 90 | Temp 98.9°F | Ht 60.0 in | Wt 171.8 lb

## 2018-12-30 DIAGNOSIS — Z9079 Acquired absence of other genital organ(s): Secondary | ICD-10-CM | POA: Insufficient documentation

## 2018-12-30 DIAGNOSIS — Z79899 Other long term (current) drug therapy: Secondary | ICD-10-CM | POA: Diagnosis not present

## 2018-12-30 DIAGNOSIS — Z90710 Acquired absence of both cervix and uterus: Secondary | ICD-10-CM | POA: Diagnosis not present

## 2018-12-30 DIAGNOSIS — J45909 Unspecified asthma, uncomplicated: Secondary | ICD-10-CM | POA: Insufficient documentation

## 2018-12-30 DIAGNOSIS — D069 Carcinoma in situ of cervix, unspecified: Secondary | ICD-10-CM | POA: Insufficient documentation

## 2018-12-30 DIAGNOSIS — Z7951 Long term (current) use of inhaled steroids: Secondary | ICD-10-CM | POA: Diagnosis not present

## 2018-12-30 DIAGNOSIS — Z636 Dependent relative needing care at home: Secondary | ICD-10-CM

## 2018-12-30 DIAGNOSIS — I10 Essential (primary) hypertension: Secondary | ICD-10-CM | POA: Diagnosis not present

## 2018-12-30 DIAGNOSIS — E785 Hyperlipidemia, unspecified: Secondary | ICD-10-CM | POA: Insufficient documentation

## 2018-12-30 DIAGNOSIS — F411 Generalized anxiety disorder: Secondary | ICD-10-CM

## 2018-12-30 NOTE — Patient Instructions (Signed)
We will arrange your appointment at [facility] and we will see you back here for follow up 4 months after your procedure.

## 2018-12-30 NOTE — Progress Notes (Signed)
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 P2 female who is seen in consultation from Dr. Glennon Mac for new diagnosis of CIN-3.   Patient was initially referred to Dr. Glennon Mac from Raelyn Ensign, FNP/[facility] for ASCUS with positive high-risk HPV on 07/03/18 - Pap smear result from 06/23/2018 showed endocervical/transformation zone present. HIV nonreactive. RPR nonreactive. Gonorrhea/Chlamydia- negative.   She has a history of total abdominal hysterectomy (11/15/2014 with Dr. Glennon Mac) with bilateral salpingectomy and cystoscopy for menorrhagia with regular cycles and fibroid uterus.   Final pathology from 11/15/2014 showed  A. 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  Her 6-week postop exam indicated no cervix.  Colposcopy was performed on 07/28/2018 Pathology: 1.  Cervix, biopsy, 5:00: -High-grade squamous intraepithelial lesion (CIN-2-3, high-grade dysplasia) 2.  Cervix, biopsy, 8:00 -Low-grade squamous intraepithelial lesion (CIN-1, low-grade dysplasia) 3.  Endocervix, curettage -Low-grade squamous intraepithelial lesion (CIN-1, low-grade dysplasia)  Per Dr. Glennon Mac, suspicion of cervical remnant.    Ultrasound on 12/17/2018-  Adnexa: No masses noted.  Only one ovary definitively noted in the midline measuring 2.9 x 1.9 x 1.7 cm.  No free fluid in the cul-de-sac. Uterus: Surgically absent Additional: No definitive cervix noted.  Hyperechoic density noted between the vaginal wall and bladder  of uncertain significance.  It appears well-circumscribed.  No bleeding or discharge.  Problem List: Patient Active Problem List   Diagnosis Date Noted  . CIN III (cervical intraepithelial neoplasia grade III) with severe dysplasia 08/11/2018  . Atypical squamous cell changes of undetermined significance (ASCUS) on cervical cytology with positive high risk human papilloma virus (HPV) 06/29/2018  . Prediabetes 06/24/2018  . Class 1 obesity due to excess calories with serious comorbidity and body mass index (BMI) of 33.0 to 33.9 in adult 05/20/2018  . Essential hypertension 05/20/2018  . Hyperlipidemia 05/20/2018  . Asthma 05/20/2018  . Anxiety in acute stress reaction 05/20/2018  . Status post hysterectomy 11/15/2014    Past Medical History: Past Medical History:  Diagnosis Date  . Allergy   . Asthma   . Fibroid uterus 11/15/2014  . Headache   . Hypertension   . Menorrhagia with irregular cycle 11/15/2014  . Shortness of breath dyspnea     Past Surgical History: Past Surgical History:  Procedure Laterality Date  . ABDOMINAL HYSTERECTOMY N/A 11/15/2014   Procedure: HYSTERECTOMY ABDOMINAL/BILATERAL SALPINGECTOMY ;  Surgeon: Will Bonnet, MD;  Location: [facility] ORS;  Service: Gynecology;  Laterality: N/A;  . CESAREAN SECTION    . CYSTOSCOPY N/A 11/15/2014   Procedure: CYSTOSCOPY;  Surgeon: Will Bonnet, MD;  Location: [facility] ORS;  Service: Gynecology;  Laterality: N/A;  . DILATION AND CURETTAGE OF UTERUS    . GANGLION CYST EXCISION Left   . UTERINE FIBROID SURGERY        OB History:  OB History  No obstetric history on file.    Family History: Family History  Problem Relation Age of Onset  . Congestive Heart Failure Mother   . Diabetes Mother   . Hypertension Mother   . Diabetes Maternal Grandmother   . Congestive Heart  Failure Maternal Grandfather     Social History: Social History   Socioeconomic History  . Marital status: Married    Spouse name: Fritz Pickerel   . Number of children: 2  . Years of education: Not on file  . Highest education level: Not on file  Occupational History  . Not on file  Social Needs  . Financial resource strain: Not on file  . Food insecurity    Worry: Never true    Inability: Never true  . Transportation needs    Medical: No    Non-medical: No  Tobacco Use  . Smoking status: Never Smoker  . Smokeless tobacco: Never Used  Substance and Sexual Activity  . Alcohol use: Not Currently    Comment: occ  . Drug use: No  . Sexual activity: Yes    Partners: Male    Birth control/protection: Surgical  Lifestyle  . Physical activity    Days per week: 0 days    Minutes per session: 0 min  . Stress: Not at all  Relationships  . Social connections    Talks on phone: More than three times a week    Gets together: More than three times a week    Attends religious service: More than 4 times per year    Active member of club or organization: No    Attends meetings of clubs or organizations: Never    Relationship status: Married  . Intimate partner violence    Fear of current or ex partner: No    Emotionally abused: No    Physically abused: No    Forced sexual activity: No  Other Topics Concern  . Not on file  Social History Narrative  . Not on file    Allergies: Allergies  Allergen Reactions  . Macadamia Nut Oil Shortness Of Breath  . Apple Nausea And Vomiting  . Fruit & Vegetable Daily [Nutritional Supplements] Nausea And Vomiting    Cannot tolerate apples, peaches, plums, and nectarines  . Kiwi Extract Nausea And Vomiting  . Peach [Prunus Persica] Nausea And Vomiting    Current Medications: Current Outpatient Medications  Medication Sig Dispense Refill  . albuterol (PROVENTIL HFA;VENTOLIN HFA) 108 (90 BASE) MCG/ACT inhaler Inhale 1-2 puffs into the lungs every 6 (six) hours as needed for wheezing or shortness of breath.    . budesonide-formoterol (SYMBICORT) 160-4.5 MCG/ACT inhaler Inhale into the  lungs.    . busPIRone (BUSPAR) 7.5 MG tablet Take 1 tablet once daily at night; may take 1 additional tablet daily if needed for anxiety. 180 tablet 1  . EPINEPHrine 0.15 MG/0.15ML IJ injection Inject into the skin.    Marland Kitchen levocetirizine (XYZAL) 5 MG tablet Take 5 mg by mouth every evening.    Marland Kitchen losartan (COZAAR) 25 MG tablet Take 0.5 tablets (12.5 mg total) by mouth daily. 90 tablet 1  . montelukast (SINGULAIR) 10 MG tablet Take by mouth.     No current facility-administered medications for this visit.     Review of Systems General: negative for fevers, chills, fatigue, changes in sleep, changes in weight or appetite Skin: negative for changes in color, texture, moles or lesions Eyes: negative for changes in vision, pain, diplopia HEENT: negative for change in hearing, pain, discharge, tinnitus, vertigo, voice changes, sore throat, neck masses Breasts: negative for breast lumps Pulmonary: negative for dyspnea, orthopnea, productive cough Cardiac: negative for palpitations, syncope, pain, discomfort, pressure Gastrointestinal: negative for dysphagia, nausea, vomiting, jaundice, pain, constipation, diarrhea, hematemesis, hematochezia Genitourinary/Sexual: negative for dysuria,  discharge, hesitancy, nocturia, retention, stones, infections, STD's, incontinence Ob/Gyn: negative for irregular bleeding, pain Musculoskeletal: negative for pain, stiffness, swelling, range of motion limitation Hematology: negative for easy bruising, bleeding Neurologic/Psych: negative for headaches, seizures, paralysis, weakness, tremor, change in gait, change in sensation, mood swings, depression, anxiety, change in memory   Objective:  Physical Examination:  Today's Vitals   12/30/18 1325 12/30/18 1327  BP: (!) 145/83 135/88  Pulse:  90  Temp: 98.9 F (37.2 C)   TempSrc: Tympanic   Weight: 171 lb 12.8 oz (77.9 kg)   Height: 5' (1.524 m)    Body mass index is 33.55 kg/m. LMP 11/15/2014     ECOG  Performance Status: 0 - Asymptomatic  GENERAL: Patient is a well appearing female in no acute distress HEENT:  PERRL, neck supple with midline trachea. Thyroid without masses.  NODES:  No cervical, supraclavicular, axillary, or inguinal lymphadenopathy palpated.  LUNGS:  Clear to auscultation bilaterally.  No wheezes or rhonchi. HEART:  Regular rate and rhythm. No murmur appreciated. ABDOMEN:  Soft, nontender.  Positive, normoactive bowel sounds.  MSK:  No focal spinal tenderness to palpation. Full range of motion bilaterally in the upper extremities. EXTREMITIES:  No peripheral edema.   SKIN:  Clear with no obvious rashes or skin changes. No nail dyscrasia. NEURO:  Nonfocal. Well oriented.  Appropriate affect.  Pelvic: EGBUS: no lesions Cervix: small remnant with no lesions visible or palpable Vagina: no lesions, no discharge or bleeding Adnexa: no palpable masses Rectovaginal: confirmatory   Colposcopy of cervix remnant done and shows small area of faint AWE without vascular changes at 6 o'clock    Assessment:  KHRISTIANA GREANEY is a 48 y.o. female diagnosed with CINI/II on biopsy in cervical remnant s/p prior TAH for fibroids.   Medical co-morbidities complicating care: HTN.  Plan:   Problem List Items Addressed This Visit      Genitourinary   CIN III (cervical intraepithelial neoplasia grade III) with severe dysplasia - Primary      We discussed options for management including excision or destruction of CIN.  I think there could be risk of damage to the bladder with vaginal trachelectomy in view of small remnant.  In view of this, would recommend laser ablation of the CIN and then follow up PAPs.  If subsequently dysplasia is persistent can do an excisional procedure.    Since there is not a laser in the clinic at [facility], I will see her in my office for the procedure next month.  [name] diagnosis, an outline of the further diagnostic and laboratory studies which will  be required, the recommendation for surgery, and alternatives were discussed with her and her accompanying family members.  All questions were answered to their satisfaction.  A total of 30 minutes were spent with the patient/family today; 50 % was spent in education, counseling and coordination of care for CIN.    Mellody Drown, MD  CC:  Will Bonnet, MD [address] [place],  [place] [address] [phone]

## 2018-12-31 ENCOUNTER — Telehealth: Payer: Self-pay

## 2018-12-31 DIAGNOSIS — J3089 Other allergic rhinitis: Secondary | ICD-10-CM | POA: Diagnosis not present

## 2018-12-31 DIAGNOSIS — J301 Allergic rhinitis due to pollen: Secondary | ICD-10-CM | POA: Diagnosis not present

## 2018-12-31 DIAGNOSIS — J3081 Allergic rhinitis due to animal (cat) (dog) hair and dander: Secondary | ICD-10-CM | POA: Diagnosis not present

## 2018-12-31 NOTE — Chronic Care Management (AMB) (Signed)
Care Management    Clinical Social Work General Note  12/31/2018 Name: Tracey Huff MRN: [id number] DOB: 02-Dec-1970  Tracey Huff is a 48 y.o. year old female who is a primary care patient of Tracey Hartshorn, FNP. The CM was consulted to assist the patient with Mental Health Counseling and Resources.   Tracey Huff was given information about Care Management services today including:  1. CM service includes personalized support from designated clinical staff supervised by her physician, including individualized plan of care and coordination with other care providers 2. 24/7 contact phone numbers for assistance for urgent and routine care needs. 3. The patient may stop CCM services at any time (effective at the end of the month) by phone call to the office staff.  Patient agreed to services and verbal consent obtained.   Review of patient status, including review of consultants reports, relevant laboratory and other test results, and collaboration with appropriate care team members and the patient's provider was performed as part of comprehensive patient evaluation and provision of chronic care management services.    SDOH (Social Determinants of Health) screening performed today. See Care Plan Entry related to challenges with: Stress  Outpatient Encounter Medications as of 12/30/2018  Medication Sig  . albuterol (PROVENTIL HFA;VENTOLIN HFA) 108 (90 BASE) MCG/ACT inhaler Inhale 1-2 puffs into the lungs every 6 (six) hours as needed for wheezing or shortness of breath.  . budesonide-formoterol (SYMBICORT) 160-4.5 MCG/ACT inhaler Inhale into the lungs.  . busPIRone (BUSPAR) 7.5 MG tablet Take 1 tablet once daily at night; may take 1 additional tablet daily if needed for anxiety.  Marland Kitchen EPINEPHrine 0.15 MG/0.15ML IJ injection Inject into the skin.  Marland Kitchen levocetirizine (XYZAL) 5 MG tablet Take 5 mg by mouth every evening.  Marland Kitchen losartan (COZAAR) 25 MG tablet Take 0.5 tablets (12.5 mg total) by mouth  daily.  . montelukast (SINGULAIR) 10 MG tablet Take by mouth.   No facility-administered encounter medications on file as of 12/30/2018.     Goals Addressed            This Visit's Progress   . "I am under alot of stress with caregiving for my husband" (pt-stated)       Phone call to patient today to discuss CM program and to provide support regarding caregiver stress related to her husband's care needs. Per patient, she is her husband's main caregiver and the care giving responsibility in addition to taking care of her own needs is demanding. Patient discussed effort to maintain a positive attitude around this, stating that she has a strong faith. She also reports reading, walking and listening to music to cope. Patient agrees with follow up with the Employee Assistance Program and a Tourist information centre manager for in home providers.  Current Barriers:  . Family relationship dysfunction . Limited access to caregiver support  Clinical Social Work Clinical Goal(s):  Marland Kitchen Over the next 30 days, client will follow up with the Employee Assistance and Counseling Program * as directed by SW  Interventions: . Patient interviewed and appropriate assessments performed . Allowed patient to vent her frustrations, emotional support provided . Provided supportive counseling and support with regard to her caregiving responsibilities and lack of support from some members of her family . Discussed the importance and benefits of having consistent self care strategies in place to cope with caregiver strain  . Discussed plans with patient for ongoing care management follow up and provided patient with direct contact information for care management  team . Assisted patient/caregiver with obtaining information about health plan benefits including the Employee Assistance and Counseling Program  Patient Self Care Activities:  . Performs ADL's independently . Performs IADL's independently  Initial goal  documentation         Follow Up Plan: SW will follow up with patient by phone over the next week regarding referral to the employee assistance program and resources for in home support        [place], [place] Worker  [place] [facility] Management [phone]

## 2018-12-31 NOTE — Patient Instructions (Signed)
Thank you allowing the Chronic Care Management Team to be a part of your care! It was a pleasure speaking with you today!  1. Please call the Employee Assistance Counseling Program to schedule an initial appointment  [phone] 2. Please call this social worker with any questions or concerns regarding your community resource needs   CCM (Chronic Care Management) Team   Trish Fountain RN, BSN Nurse Care Coordinator  [phone]  Ruben Reason PharmD  Clinical Pharmacist  [phone]   Elliot Gurney, LCSW Clinical Social Worker [phone]  Goals Addressed            This Visit's Progress   . "I am under alot of stress with caregiving for my husband" (pt-stated)       Current Barriers:  . Family relationship dysfunction . Limited access to caregiver support  Clinical Social Work Clinical Goal(s):  Marland Kitchen Over the next 30 days, client will follow up with the Employee Assistance and Counseling Program * as directed by SW  Interventions: . Patient interviewed and appropriate assessments performed . Allowed patient to vent her frustrations, emotional support provided . Provided supportive counseling and support with regard to her caregiving responsibilities and lack of support from some members of her family . Discussed the importance and benefits of having consistent self care strategies in place to cope with caregiver strain  . Discussed plans with patient for ongoing care management follow up and provided patient with direct contact information for care management team . Assisted patient/caregiver with obtaining information about health plan benefits including the Employee Assistance and Counseling Program  Patient Self Care Activities:  . Performs ADL's independently . Performs IADL's independently  Initial goal documentation         The patient verbalized understanding of instructions provided today and declined a print copy of patient instruction materials.    Telephone follow up appointment with care management team member scheduled for: 01/07/2019

## 2018-12-31 NOTE — Telephone Encounter (Signed)
Information sent to Dr. Blake Divine team at [facility] to arrange laser ablation. We will see her in follow up 4 months following her procedure.

## 2019-01-07 ENCOUNTER — Ambulatory Visit: Payer: Self-pay | Admitting: *Deleted

## 2019-01-07 DIAGNOSIS — J301 Allergic rhinitis due to pollen: Secondary | ICD-10-CM | POA: Diagnosis not present

## 2019-01-07 DIAGNOSIS — Z636 Dependent relative needing care at home: Secondary | ICD-10-CM

## 2019-01-07 DIAGNOSIS — J3089 Other allergic rhinitis: Secondary | ICD-10-CM | POA: Diagnosis not present

## 2019-01-07 DIAGNOSIS — J3081 Allergic rhinitis due to animal (cat) (dog) hair and dander: Secondary | ICD-10-CM | POA: Diagnosis not present

## 2019-01-07 DIAGNOSIS — F411 Generalized anxiety disorder: Secondary | ICD-10-CM

## 2019-01-07 NOTE — Chronic Care Management (AMB) (Signed)
   Care Management    Clinical Social Work Follow Up Note  01/07/2019 Name: Tracey Huff MRN: [id number] DOB: 10/04/1970  Tracey Huff is a 48 y.o. year old female who is a primary care patient of Tracey Hartshorn, FNP. The CCM team was consulted for assistance with Mental Health Counseling and Resources.   Review of patient status, including review of consultants reports, other relevant assessments, and collaboration with appropriate care team members and the patient's provider was performed as part of comprehensive patient evaluation and provision of chronic care management services.     Outpatient Encounter Medications as of 01/07/2019  Medication Sig  . albuterol (PROVENTIL HFA;VENTOLIN HFA) 108 (90 BASE) MCG/ACT inhaler Inhale 1-2 puffs into the lungs every 6 (six) hours as needed for wheezing or shortness of breath.  . budesonide-formoterol (SYMBICORT) 160-4.5 MCG/ACT inhaler Inhale into the lungs.  . busPIRone (BUSPAR) 7.5 MG tablet Take 1 tablet once daily at night; may take 1 additional tablet daily if needed for anxiety.  Marland Kitchen EPINEPHrine 0.15 MG/0.15ML IJ injection Inject into the skin.  Marland Kitchen levocetirizine (XYZAL) 5 MG tablet Take 5 mg by mouth every evening.  Marland Kitchen losartan (COZAAR) 25 MG tablet Take 0.5 tablets (12.5 mg total) by mouth daily.  . montelukast (SINGULAIR) 10 MG tablet Take by mouth.   No facility-administered encounter medications on file as of 01/07/2019.      Goals Addressed            This Visit's Progress   . "I am under alot of stress with caregiving for my husband" (pt-stated)       Current Barriers:  . Family relationship dysfunction . Limited access to caregiver support  Clinical Social Work Clinical Goal(s):  Marland Kitchen Over the next 30 days, client will follow up with the Employee Assistance and Counseling Program * as directed by SW  Interventions: . Patient interviewed and appropriate assessments performed . Continued to allow patient to vent her  frustrations related to her care giving responsibilities and family discord, (patient's spouse re-admitted to the hospital) emotional support provided . Provided supportive counseling and support with regard to her caregiver role and lack of support from some members of her family . Discussed the importance of arranging consistent in home care in the home that will benefit both her and her spouse . Reinforced the importance and benefits of having consistent self care strategies in place to cope with caregiver strain  . Discussed plans with patient for ongoing care management follow up and provided patient with direct contact information for care management team . Reinforced need to schedule an appointment with the Employee Assistance and Counseling Program for ongoing mental health support  Patient Self Care Activities:  . Performs ADL's independently . Performs IADL's independently  Please see past updates related to this goal by clicking on the "Past Updates" button in the selected goal          Follow Up Plan: SW will follow up with patient by phone over the next 2 weeks. Community resources to be provided related to in home care options   [place], [place] Worker  [facility] [phone]

## 2019-01-07 NOTE — Patient Instructions (Signed)
Thank you allowing the Chronic Care Management Team to be a part of your care! It was a pleasure speaking with you today!  1. Please follow up with the Employee Assistance and Counseling Program to schedule the initial appointment 2. Please explore in home care options from list that will be securely provided by email for in home help 3. Please call this social worker with any questions or concerns related to your community resource needs   CCM (Chronic Care Management) Team   Thressa Sheller RN, BSN Nurse Care Coordinator  [phone]  Ruben Reason PharmD  Clinical Pharmacist  [phone]   Elliot Gurney, LCSW Clinical Social Worker [phone]  Goals Addressed            This Visit's Progress   . "I am under alot of stress with caregiving for my husband" (pt-stated)       Current Barriers:  . Family relationship dysfunction . Limited access to caregiver support  Clinical Social Work Clinical Goal(s):  Marland Kitchen Over the next 30 days, client will follow up with the Employee Assistance and Counseling Program * as directed by SW  Interventions: . Patient interviewed and appropriate assessments performed . Continued to allow patient to vent her frustrations related to her care giving responsibilities and family discord, (patient's spouse re-admitted to the hospital) emotional support provided . Provided supportive counseling and support with regard to her caregiver role and lack of support from some members of her family . Discussed the importance of arranging consistent in home care in the home that will benefit both her and her spouse . Reinforced the importance and benefits of having consistent self care strategies in place to cope with caregiver strain  . Discussed plans with patient for ongoing care management follow up and provided patient with direct contact information for care management team . Reinforced need to schedule an appointment with the Employee Assistance and  Counseling Program for ongoing mental health support  Patient Self Care Activities:  . Performs ADL's independently . Performs IADL's independently  Please see past updates related to this goal by clicking on the "Past Updates" button in the selected goal          The patient verbalized understanding of instructions provided today and declined a print copy of patient instruction materials.   Telephone follow up appointment with care management team member scheduled for:

## 2019-01-13 MED FILL — LOSARTAN POTASSIUM 25 MG TA: 25 | 30 days supply | Qty: 15 | Fill #0

## 2019-01-13 MED FILL — busPIRone HCL 7.5 MG TABS: 7.5 | 90 days supply | Qty: 180 | Fill #0

## 2019-01-15 DIAGNOSIS — J3089 Other allergic rhinitis: Secondary | ICD-10-CM | POA: Diagnosis not present

## 2019-01-15 DIAGNOSIS — J3081 Allergic rhinitis due to animal (cat) (dog) hair and dander: Secondary | ICD-10-CM | POA: Diagnosis not present

## 2019-01-15 DIAGNOSIS — J301 Allergic rhinitis due to pollen: Secondary | ICD-10-CM | POA: Diagnosis not present

## 2019-01-21 ENCOUNTER — Ambulatory Visit: Payer: Self-pay | Admitting: *Deleted

## 2019-01-21 DIAGNOSIS — Z636 Dependent relative needing care at home: Secondary | ICD-10-CM

## 2019-01-21 DIAGNOSIS — J301 Allergic rhinitis due to pollen: Secondary | ICD-10-CM | POA: Diagnosis not present

## 2019-01-21 DIAGNOSIS — J3081 Allergic rhinitis due to animal (cat) (dog) hair and dander: Secondary | ICD-10-CM | POA: Diagnosis not present

## 2019-01-21 DIAGNOSIS — J3089 Other allergic rhinitis: Secondary | ICD-10-CM | POA: Diagnosis not present

## 2019-01-21 DIAGNOSIS — F411 Generalized anxiety disorder: Secondary | ICD-10-CM

## 2019-01-21 DIAGNOSIS — F430 Acute stress reaction: Secondary | ICD-10-CM

## 2019-01-21 NOTE — Chronic Care Management (AMB) (Signed)
Care Management    Clinical Social Work Follow Up Note  01/21/2019 Name: Tracey Huff MRN: [id number] DOB: January 22, 1971  Tracey Huff is a 48 y.o. year old female who is a primary care patient of Tracey Hartshorn, FNP. The CCM team was consulted for assistance with Caregiver Stress.   Review of patient status, including review of consultants reports, other relevant assessments, and collaboration with appropriate care team members and the patient's provider was performed as part of comprehensive patient evaluation and provision of chronic care management services.      Outpatient Encounter Medications as of 01/21/2019  Medication Sig  . albuterol (PROVENTIL HFA;VENTOLIN HFA) 108 (90 BASE) MCG/ACT inhaler Inhale 1-2 puffs into the lungs every 6 (six) hours as needed for wheezing or shortness of breath.  . budesonide-formoterol (SYMBICORT) 160-4.5 MCG/ACT inhaler Inhale into the lungs.  . busPIRone (BUSPAR) 7.5 MG tablet Take 1 tablet once daily at night; may take 1 additional tablet daily if needed for anxiety.  Marland Kitchen EPINEPHrine 0.15 MG/0.15ML IJ injection Inject into the skin.  Marland Kitchen levocetirizine (XYZAL) 5 MG tablet Take 5 mg by mouth every evening.  Marland Kitchen losartan (COZAAR) 25 MG tablet Take 0.5 tablets (12.5 mg total) by mouth daily.  . montelukast (SINGULAIR) 10 MG tablet Take by mouth.   No facility-administered encounter medications on file as of 01/21/2019.      Goals Addressed            This Visit's Progress   . "I am under alot of stress with caregiving for my husband" (pt-stated)       Phone call to patient to follow up on in home care resources previously provided. Patient states that her husband is back in the hospital. She continues to experience increased stress due to the lack of assistance with his care. Patient is not in the position to pay out of pocket for in home care and plans to schedule a family meeting while her spouse is still in the hospital to reinforce the need for  increased care and to develop a care schedule.  Current Barriers:  . Family relationship dysfunction . Limited access to caregiver support  Clinical Social Work Clinical Goal(s):  Marland Kitchen Over the next 30 days, client will follow up with the Employee Assistance and Counseling Program *\ as directed by SW  Interventions: . Patient interviewed and appropriate assessments performed . Continued to allow patient to vent her frustrations related to her care giving responsibilities and family discord, (patient's spouse re-admitted to the hospital again) emotional support provided . Continued to provide supportive counseling and support with regard to her caregiver role and lack of support from some members of her family . Discussed the importance of arranging consistent in home care in the home that will benefit both her and her spouse and confirmed that resources were received . Reinforced the importance and benefits of having consistent self care strategies in place to cope with caregiver strain  . Recommended having a family meeting before her husband leaves the hospital to reinforce the need for a care schedule if private duty care is not an option . Discussed plan to file for disability on husband's behalf which would help with payment for in home care . Discussed plans with patient for ongoing care management follow up and provided patient with direct contact information for care management team . Reinforced need to schedule an appointment with the Employee Assistance and Counseling Program for ongoing mental health support  Patient  Self Care Activities:  . Performs ADL's independently . Performs IADL's independently  Please see past updates related to this goal by clicking on the "Past Updates" button in the selected goal          Follow Up Plan: Appointment scheduled for SW follow up with client by phone on: 02/04/19   Tracey Huff, [place] Worker  [facility] Care [facility] [phone]

## 2019-01-21 NOTE — Patient Instructions (Signed)
Thank you allowing the Chronic Care Management Team to be a part of your care! It was a pleasure speaking with you today!  1. Please call this social worker with any questions or concerns regarding private duty resources for your spouse.   CCM (Chronic Care Management) Team     Ruben Reason PharmD  Clinical Pharmacist  [phone]   [place], LCSW Clinical Social Worker [phone]  Goals Addressed            This Visit's Progress   . "I am under alot of stress with caregiving for my husband" (pt-stated)       Current Barriers:  . Family relationship dysfunction . Limited access to caregiver support  Clinical Social Work Clinical Goal(s):  Marland Kitchen Over the next 30 days, client will follow up with the Employee Assistance and Counseling Program * as directed by SW  Interventions: . Patient interviewed and appropriate assessments performed . Continued to allow patient to vent her frustrations related to her care giving responsibilities and family discord, (patient's spouse re-admitted to the hospital again) emotional support provided . Continued to provide supportive counseling and support with regard to her caregiver role and lack of support from some members of her family . Discussed the importance of arranging consistent in home care in the home that will benefit both her and her spouse and confirmed that resources were received . Reinforced the importance and benefits of having consistent self care strategies in place to cope with caregiver strain  . Recommended having a family meeting before her husband leaves the hospital to reinforce the need for a care schedule if private duty care is not an option . Discussed plan to file for disability on husband's behalf which would help with payment for in home care . Discussed plans with patient for ongoing care management follow up and provided patient with direct contact information for care management team . Reinforced need  to schedule an appointment with the Employee Assistance and Counseling Program for ongoing mental health support  Patient Self Care Activities:  . Performs ADL's independently . Performs IADL's independently  Please see past updates related to this goal by clicking on the "Past Updates" button in the selected goal          The patient verbalized understanding of instructions provided today and declined a print copy of patient instruction materials.   Telephone follow up appointment with care management team member scheduled for: 02/04/19

## 2019-01-28 ENCOUNTER — Ambulatory Visit: Payer: Self-pay | Admitting: *Deleted

## 2019-01-28 DIAGNOSIS — J301 Allergic rhinitis due to pollen: Secondary | ICD-10-CM | POA: Diagnosis not present

## 2019-01-28 DIAGNOSIS — J3089 Other allergic rhinitis: Secondary | ICD-10-CM | POA: Diagnosis not present

## 2019-01-28 DIAGNOSIS — J3081 Allergic rhinitis due to animal (cat) (dog) hair and dander: Secondary | ICD-10-CM | POA: Diagnosis not present

## 2019-01-28 NOTE — Chronic Care Management (AMB) (Signed)
   Care Management    Clinical Social Work General Follow Up Note  01/28/2019 Name: Tracey Huff MRN: [id number] DOB: [birth year]  Tracey Huff is a 48 y.o. year old female who is a primary care patient of Tracey Hartshorn, FNP. The CCM team was consulted for assistance with Caregiver Stress.   Review of patient status, including review of consultants reports, relevant laboratory and other test results, and collaboration with appropriate care team members and the patient's provider was performed as part of comprehensive patient evaluation and provision of chronic care management services.    Advanced Directives Status: <no information> See Care Plan for related entries.   Outpatient Encounter Medications as of 01/28/2019  Medication Sig  . albuterol (PROVENTIL HFA;VENTOLIN HFA) 108 (90 BASE) MCG/ACT inhaler Inhale 1-2 puffs into the lungs every 6 (six) hours as needed for wheezing or shortness of breath.  . budesonide-formoterol (SYMBICORT) 160-4.5 MCG/ACT inhaler Inhale into the lungs.  . busPIRone (BUSPAR) 7.5 MG tablet Take 1 tablet once daily at night; may take 1 additional tablet daily if needed for anxiety.  Marland Kitchen EPINEPHrine 0.15 MG/0.15ML IJ injection Inject into the skin.  Marland Kitchen levocetirizine (XYZAL) 5 MG tablet Take 5 mg by mouth every evening.  Marland Kitchen losartan (COZAAR) 25 MG tablet Take 0.5 tablets (12.5 mg total) by mouth daily.  . montelukast (SINGULAIR) 10 MG tablet Take by mouth.   No facility-administered encounter medications on file as of 01/28/2019.     Goals Addressed            This Visit's Progress   . "I am under alot of stress with caregiving for my husband" (pt-stated)       Current Barriers:  . Family relationship dysfunction . Limited access to caregiver support  Clinical Social Work Clinical Goal(s):  Marland Kitchen Over the next 30 days, client will follow up with the Employee Assistance and Counseling Program * as directed by SW  Interventions: . Patient interviewed and  appropriate assessments performed . Patient discussed that a Education officer, museum from the [employer] visited her home to evaluate her husband's care . Patient verbalized her frustration and anger as she feels that the call to Social Services was in retaliation from a family member . This Education officer, museum allowed patient to vent her frustrations and provided emotional support . Reinforced the importance of open dialogue with social services and being open to receive any resources that can be offered to assist with her husbands care.   Patient Self Care Activities:  . Performs ADL's independently . Performs IADL's independently  Please see past updates related to this goal by clicking on the "Past Updates" button in the selected goal           Follow Up Plan: SW will follow up with patient by phone over the next 2 weeks    [place], [place] Worker  [facility] Care Management [phone]

## 2019-01-28 NOTE — Patient Instructions (Addendum)
Thank you allowing the Chronic Care Management Team to be a part of your care! It was a pleasure speaking with you today!  1. Please call this social worker with any questions or concerns regarding your mental health needs.  CCM (Chronic Care Management) Team     Ruben Reason PharmD  Clinical Pharmacist  [phone]   [place], LCSW Clinical Social Worker [phone]  Goals Addressed            This Visit's Progress   . "I am under alot of stress with caregiving for my husband" (pt-stated)       Current Barriers:  . Family relationship dysfunction . Limited access to caregiver support  Clinical Social Work Clinical Goal(s):  Marland Kitchen Over the next 30 days, client will follow up with the Employee Assistance and Counseling Program * as directed by SW  Interventions: . Patient interviewed and appropriate assessments performed . Patient discussed that a Education officer, museum from the [place] visited her home to evaluate her husband's care . Patient verbalized her frustration and anger as she feels that the call to Social Services was in retaliation from a family member . This Education officer, museum allowed patient to vent her frustrations and provided emotional support . Reinforced the importance of open dialogue with social services and being open to receive any resources that can be offered to assist with her husbands care.   Patient Self Care Activities:  . Performs ADL's independently . Performs IADL's independently  Please see past updates related to this goal by clicking on the "Past Updates" button in the selected goal          The patient verbalized understanding of instructions provided today and declined a print copy of patient instruction materials.   Telephone follow up appointment with care management team member scheduled for:02/04/19

## 2019-01-29 DIAGNOSIS — R87613 High grade squamous intraepithelial lesion on cytologic smear of cervix (HGSIL): Secondary | ICD-10-CM | POA: Diagnosis not present

## 2019-02-03 ENCOUNTER — Ambulatory Visit: Payer: Self-pay | Admitting: *Deleted

## 2019-02-03 DIAGNOSIS — F411 Generalized anxiety disorder: Secondary | ICD-10-CM

## 2019-02-03 DIAGNOSIS — Z636 Dependent relative needing care at home: Secondary | ICD-10-CM

## 2019-02-03 NOTE — Patient Instructions (Signed)
Thank you allowing the Chronic Care Management Team to be a part of your care! It was a pleasure speaking with you today!  1. Please call this social worker with any questions or concerns regarding community resource needs.  CCM (Chronic Care Management) Team    Ruben Reason PharmD  Clinical Pharmacist  [phone]   [place], LCSW Clinical Social Worker [phone]  Goals Addressed            This Visit's Progress   . "I am under alot of stress with caregiving for my husband" (pt-stated)       Current Barriers:  . Family relationship dysfunction . Limited access to caregiver support  Clinical Social Work Clinical Goal(s):  Marland Kitchen Over the next 30 days, client will follow up with the Employee Assistance and Counseling Program * as directed by SW  Interventions: . Patient interviewed and appropriate assessments performed . Patient provided update from the Department of Social Services in regards to report made and information that she will need to provide to prove her spouse is being cared for . This social worker continued to provide emotional support allowing patient to vent her frustrations in regards to the report made despite the care she provides for her spouse. . Continued to reinforce the importance of open dialogue with social services and taking time for self care   Patient Self Care Activities:  . Performs ADL's independently . Performs IADL's independently  Please see past updates related to this goal by clicking on the "Past Updates" button in the selected goal          The patient verbalized understanding of instructions provided today and declined a print copy of patient instruction materials.   The care management team will reach out to the patient again over the next 14 days.

## 2019-02-03 NOTE — Chronic Care Management (AMB) (Signed)
  Chronic Care Management    Clinical Social Work Follow Up Note  02/03/2019 Name: Tracey Huff MRN: [id number] DOB: December 09, 1970  Tracey Huff is a 48 y.o. year old female who is a primary care patient of Tracey Hartshorn, FNP. The CCM team was consulted for assistance with Caregiver Stress.   Review of patient status, including review of consultants reports, other relevant assessments, and collaboration with appropriate care team members and the patient's provider was performed as part of comprehensive patient evaluation and provision of chronic care management services.    Advanced Directives Status: <no information> See Care Plan for related entries.   Outpatient Encounter Medications as of 02/03/2019  Medication Sig  . albuterol (PROVENTIL HFA;VENTOLIN HFA) 108 (90 BASE) MCG/ACT inhaler Inhale 1-2 puffs into the lungs every 6 (six) hours as needed for wheezing or shortness of breath.  . budesonide-formoterol (SYMBICORT) 160-4.5 MCG/ACT inhaler Inhale into the lungs.  . busPIRone (BUSPAR) 7.5 MG tablet Take 1 tablet once daily at night; may take 1 additional tablet daily if needed for anxiety.  Marland Kitchen EPINEPHrine 0.15 MG/0.15ML IJ injection Inject into the skin.  Marland Kitchen levocetirizine (XYZAL) 5 MG tablet Take 5 mg by mouth every evening.  Marland Kitchen losartan (COZAAR) 25 MG tablet Take 0.5 tablets (12.5 mg total) by mouth daily.  . montelukast (SINGULAIR) 10 MG tablet Take by mouth.   No facility-administered encounter medications on file as of 02/03/2019.      Goals Addressed            This Visit's Progress   . "I am under alot of stress with caregiving for my husband" (pt-stated)       Current Barriers:  . Family relationship dysfunction . Limited access to caregiver support  Clinical Social Work Clinical Goal(s):  Marland Kitchen Over the next 30 days, client will follow up with the Employee Assistance and Counseling Program * as directed by SW  Interventions: . Patient interviewed and appropriate  assessments performed . Patient provided update from the Department of Social Services in regards to report made and information that she will need to provide to prove her spouse is being cared for . This social worker continued to provide emotional support allowing patient to vent her frustrations in regards to the report made despite the care she provides for her spouse. . Continued to reinforce the importance of open dialogue with social services and taking time for self care   Patient Self Care Activities:  . Performs ADL's independently . Performs IADL's independently  Please see past updates related to this goal by clicking on the "Past Updates" button in the selected goal          Follow Up Plan: SW will follow up with patient by phone over the next 2 weeks   [place], [place] Worker  [facility] [phone]

## 2019-02-04 ENCOUNTER — Telehealth: Payer: Self-pay

## 2019-02-04 DIAGNOSIS — J301 Allergic rhinitis due to pollen: Secondary | ICD-10-CM | POA: Diagnosis not present

## 2019-02-04 DIAGNOSIS — J3089 Other allergic rhinitis: Secondary | ICD-10-CM | POA: Diagnosis not present

## 2019-02-04 DIAGNOSIS — J3081 Allergic rhinitis due to animal (cat) (dog) hair and dander: Secondary | ICD-10-CM | POA: Diagnosis not present

## 2019-02-08 ENCOUNTER — Encounter: Payer: Self-pay | Admitting: Family Medicine

## 2019-02-08 ENCOUNTER — Ambulatory Visit: Payer: Self-pay | Admitting: *Deleted

## 2019-02-08 DIAGNOSIS — F411 Generalized anxiety disorder: Secondary | ICD-10-CM

## 2019-02-08 DIAGNOSIS — R399 Unspecified symptoms and signs involving the genitourinary system: Secondary | ICD-10-CM

## 2019-02-08 DIAGNOSIS — F430 Acute stress reaction: Secondary | ICD-10-CM

## 2019-02-08 DIAGNOSIS — Z636 Dependent relative needing care at home: Secondary | ICD-10-CM

## 2019-02-08 NOTE — Chronic Care Management (AMB) (Signed)
   Care Management    Clinical Social Work Follow Up Note  02/08/2019 Name: SHAKTHI JANUS MRN: [id number] DOB: 05/24/1970  [place] Dom Kniola is a 48 y.o. year old female who is a primary care patient of Hubbard Hartshorn, FNP. The CCM team was consulted for assistance with Caregiver Stress.   Review of patient status, including review of consultants reports, other relevant assessments, and collaboration with appropriate care team members and the patient's provider was performed as part of comprehensive patient evaluation and provision of chronic care management services.     Advanced Directives Status: <no information> See Care Plan for related entries.   Outpatient Encounter Medications as of 02/08/2019  Medication Sig  . albuterol (PROVENTIL HFA;VENTOLIN HFA) 108 (90 BASE) MCG/ACT inhaler Inhale 1-2 puffs into the lungs every 6 (six) hours as needed for wheezing or shortness of breath.  . budesonide-formoterol (SYMBICORT) 160-4.5 MCG/ACT inhaler Inhale into the lungs.  . busPIRone (BUSPAR) 7.5 MG tablet Take 1 tablet once daily at night; may take 1 additional tablet daily if needed for anxiety.  Marland Kitchen EPINEPHrine 0.15 MG/0.15ML IJ injection Inject into the skin.  Marland Kitchen levocetirizine (XYZAL) 5 MG tablet Take 5 mg by mouth every evening.  Marland Kitchen losartan (COZAAR) 25 MG tablet Take 0.5 tablets (12.5 mg total) by mouth daily.  . montelukast (SINGULAIR) 10 MG tablet Take by mouth.   No facility-administered encounter medications on file as of 02/08/2019.      Goals Addressed            This Visit's Progress   . "I am under alot of stress with caregiving for my husband" (pt-stated)       Current Barriers:  . Family relationship dysfunction . Limited access to caregiver support  Clinical Social Work Clinical Goal(s):  Marland Kitchen Over the next 30 days, client will follow up with the Employee Assistance and Counseling Program * as directed by SW  Interventions: . Patient interviewed and appropriate  assessments performed . Social Worker updated on status of current investigation by the Department of Social Services in regards to her spouse and the care currently being provided . This social worker continued to provide emotional support allowing patient to vent her frustrations in regards to the report made despite the care she provides for her spouse. Marland Kitchen Possibility of patient's spouse qualifying for CAP services discussed . Quality of patient 's in home care discussed, identifying reliable private duty aids strongly suggested . Patient reminded of community resource list previously provided . Continued to reinforce the importance of open dialogue with social services and taking time for self care   Patient Self Care Activities:  . Performs ADL's independently . Performs IADL's independently  Please see past updates related to this goal by clicking on the "Past Updates" button in the selected goal          Follow Up Plan: Appointment scheduled for SW follow up with client by phone on: 02/22/19   Elliot Gurney, [place] Worker  [facility] [phone]

## 2019-02-08 NOTE — Patient Instructions (Signed)
Thank you allowing the Chronic Care Management Team to be a part of your care! It was a pleasure speaking with you today!  1. Please contact the private duty aids(List previously provided) to discuss possibility of arranging in home care for your spouse 2. Please call this social worker with any additional community resource needs   CCM (Chronic Care Management) Team    Ruben Reason PharmD  Clinical Pharmacist  [phone]   [place], [place] Social Worker [phone]  Goals Addressed            This Visit's Progress   . "I am under alot of stress with caregiving for my husband" (pt-stated)       Current Barriers:  . Family relationship dysfunction . Limited access to caregiver support  Clinical Social Work Clinical Goal(s):  Marland Kitchen Over the next 30 days, client will follow up with the Employee Assistance and Counseling Program * as directed by SW  Interventions: . Patient interviewed and appropriate assessments performed . Social Worker updated on status of current investigation by the Department of Social Services in regards to her spouse and the care currently being provided . This social worker continued to provide emotional support allowing patient to vent her frustrations in regards to the report made despite the care she provides for her spouse. Marland Kitchen Possibility of patient's spouse qualifying for CAP services discussed . Quality of patient 's in home care discussed, identifying reliable private duty aids strongly suggested . Patient reminded of community resource list previously provided for private duty aids . Continued to reinforce the importance of open dialogue with social services and taking time for self care including scheduling an  appointment with the employee assistance and counseling program   Patient Self Care Activities:  . Performs ADL's independently . Performs IADL's independently  Please see past updates related to this goal by clicking on  the "Past Updates" button in the selected goal          The patient verbalized understanding of instructions provided today and declined a print copy of patient instruction materials.   Telephone follow up appointment with care management team member scheduled for:02/22/19

## 2019-02-09 DIAGNOSIS — R399 Unspecified symptoms and signs involving the genitourinary system: Secondary | ICD-10-CM | POA: Diagnosis not present

## 2019-02-11 ENCOUNTER — Encounter: Payer: Self-pay | Admitting: Family Medicine

## 2019-02-11 DIAGNOSIS — J3081 Allergic rhinitis due to animal (cat) (dog) hair and dander: Secondary | ICD-10-CM | POA: Diagnosis not present

## 2019-02-11 DIAGNOSIS — J301 Allergic rhinitis due to pollen: Secondary | ICD-10-CM | POA: Diagnosis not present

## 2019-02-11 DIAGNOSIS — J3089 Other allergic rhinitis: Secondary | ICD-10-CM | POA: Diagnosis not present

## 2019-02-11 LAB — URINALYSIS, ROUTINE W REFLEX MICROSCOPIC
Bacteria, UA: NONE SEEN /HPF
Bilirubin Urine: NEGATIVE
Glucose, UA: NEGATIVE
Hyaline Cast: NONE SEEN /LPF
Ketones, ur: NEGATIVE
Leukocytes,Ua: NEGATIVE
Nitrite: NEGATIVE
Specific Gravity, Urine: 1.028 (ref 1.001–1.03)
WBC, UA: NONE SEEN /HPF (ref 0–5)
pH: <=5 (ref 5.0–8.0)

## 2019-02-11 LAB — URINE CULTURE
MICRO NUMBER:: 960632
SPECIMEN QUALITY:: ADEQUATE

## 2019-02-17 ENCOUNTER — Ambulatory Visit: Payer: Self-pay | Admitting: *Deleted

## 2019-02-17 DIAGNOSIS — F430 Acute stress reaction: Secondary | ICD-10-CM

## 2019-02-17 DIAGNOSIS — F411 Generalized anxiety disorder: Secondary | ICD-10-CM

## 2019-02-17 DIAGNOSIS — Z636 Dependent relative needing care at home: Secondary | ICD-10-CM

## 2019-02-17 NOTE — Chronic Care Management (AMB) (Signed)
   Care Management    Clinical Social Work Follow Up Note  02/17/2019 Name: Tracey Huff MRN: [id number] DOB: [birth year]  Tracey Huff [place] is a 48 y.o. year old female who is a primary care patient of Tracey Hartshorn, FNP. The CCM team was consulted for assistance with Caregiver Stress.   Review of patient status, including review of consultants reports, other relevant assessments, and collaboration with appropriate care team members and the patient's provider was performed as part of comprehensive patient evaluation and provision of chronic care management Huff.    Advanced Directives Status: <no information> See Care Plan for related entries.   Outpatient Encounter Medications as of 02/17/2019  Medication Sig  . albuterol (PROVENTIL HFA;VENTOLIN HFA) 108 (90 BASE) MCG/ACT inhaler Inhale 1-2 puffs into the lungs every 6 (six) hours as needed for wheezing or shortness of breath.  . budesonide-formoterol (SYMBICORT) 160-4.5 MCG/ACT inhaler Inhale into the lungs.  . busPIRone (BUSPAR) 7.5 MG tablet Take 1 tablet once daily at night; may take 1 additional tablet daily if needed for anxiety.  Marland Kitchen EPINEPHrine 0.15 MG/0.15ML IJ injection Inject into the skin.  Marland Kitchen levocetirizine (XYZAL) 5 MG tablet Take 5 mg by mouth every evening.  Marland Kitchen losartan (COZAAR) 25 MG tablet Take 0.5 tablets (12.5 mg total) by mouth daily.  . montelukast (SINGULAIR) 10 MG tablet Take by mouth.   No facility-administered encounter medications on file as of 02/17/2019.      Goals Addressed            This Visit's Progress   . "I am under alot of stress with caregiving for my husband" (pt-stated)       Current Barriers:  . Family relationship dysfunction . Limited access to caregiver support  Clinical Social Work Clinical Goal(s):  Marland Kitchen Over the next 30 days, client will follow up with the Tracey Huff * as directed by SW  Interventions: . Patient interviewed and appropriate  assessments performed . Social Worker updated on status of current investigation by the Tracey Huff in regards to her spouse and the care currently being provided . Continued to provide emotional support allowing patient to vent her frustrations in regards to the report made despite the care she provides for her spouse. Marland Kitchen Positive reinforcement provided due to confirmation received that  patient has identified a reliable private duty retired Marine scientist to provide patient's spouse with in home care . Provided patient with resources for Tracey Huff attorneys for estate planning, follow up with an Tracey Huff attorney encouraged . Self care continues to be strongly encoruaged . Continued to reinforce the importance of open dialogue with social Huff and taking time for self care including scheduling an  appointment with the Tracey Huff   Patient Self Care Activities:  . Performs ADL's independently . Performs IADL's independently  Please see past updates related to this goal by clicking on the "Past Updates" button in the selected goal          Follow Up Plan: SW will follow up with patient by phone over the next 2 weeks   Tracey Huff, Tracey Huff Worker  [facility] [phone] \

## 2019-02-17 NOTE — Patient Instructions (Signed)
Thank you allowing the Chronic Care Management Team to be a part of your care! It was a pleasure speaking with you today!  1. Please follow up with private duty aid for your spouse 2. Please consult with a Elder Law attorney for questions regarding estate planning  CCM (Chronic Care Management) Team   Neldon Labella RN, BSN Nurse Care Coordinator  [phone]  Ruben Reason PharmD  Clinical Pharmacist  [phone]   [place], LCSW Clinical Social Worker [phone]  Goals Addressed            This Visit's Progress   . "I am under alot of stress with caregiving for my husband" (pt-stated)       Current Barriers:  . Family relationship dysfunction . Limited access to caregiver support  Clinical Social Work Clinical Goal(s):  Marland Kitchen Over the next 30 days, client will follow up with the Employee Assistance and Counseling Program * as directed by SW  Interventions: . Patient interviewed and appropriate assessments performed . Social Worker updated on status of current investigation by the Department of Social Services in regards to her spouse and the care currently being provided . Continued to provide emotional support allowing patient to vent her frustrations in regards to the report made despite the care she provides for her spouse. Marland Kitchen Positive reinforcement provided due to confirmation received that  patient has identified a reliable private duty retired Marine scientist to provide patient's spouse with in home care . Provided patient with resources for [place] attorneys for estate planning . Self care continues to be strongly encoruaged . Continued to reinforce the importance of open dialogue with social services and taking time for self care including scheduling an  appointment with the employee assistance and counseling program   Patient Self Care Activities:  . Performs ADL's independently . Performs IADL's independently  Please see past updates related to this goal  by clicking on the "Past Updates" button in the selected goal          The patient verbalized understanding of instructions provided today and declined a print copy of patient instruction materials.   The care management team will reach out to the patient again over the next 14 days.  \

## 2019-02-18 DIAGNOSIS — J3089 Other allergic rhinitis: Secondary | ICD-10-CM | POA: Diagnosis not present

## 2019-02-18 DIAGNOSIS — J3081 Allergic rhinitis due to animal (cat) (dog) hair and dander: Secondary | ICD-10-CM | POA: Diagnosis not present

## 2019-02-18 DIAGNOSIS — J301 Allergic rhinitis due to pollen: Secondary | ICD-10-CM | POA: Diagnosis not present

## 2019-02-22 ENCOUNTER — Telehealth: Payer: Self-pay

## 2019-02-23 ENCOUNTER — Ambulatory Visit: Payer: Self-pay | Admitting: *Deleted

## 2019-02-23 DIAGNOSIS — F411 Generalized anxiety disorder: Secondary | ICD-10-CM

## 2019-02-23 DIAGNOSIS — Z636 Dependent relative needing care at home: Secondary | ICD-10-CM

## 2019-02-23 DIAGNOSIS — F430 Acute stress reaction: Secondary | ICD-10-CM

## 2019-02-23 NOTE — Patient Instructions (Addendum)
Thank you allowing the Chronic Care Management Team to be a part of your care! It was a pleasure speaking with you today!  1. Please call this social worker with any questions or concerns regarding your community resource needs  CCM (Chronic Care Management) Team   Neldon Labella RN, BSN Nurse Care Coordinator  [phone]  Ruben Reason PharmD  Clinical Pharmacist  [phone]   [place], LCSW Clinical Social Worker [phone]  Goals Addressed            This Visit's Progress   . "I am under alot of stress with caregiving for my husband" (pt-stated)       Current Barriers:  . Family relationship dysfunction . Limited access to caregiver support  Clinical Social Work Clinical Goal(s):  Marland Kitchen Over the next 30 days, client will follow up with the Employee Assistance and Counseling Program * as directed by SW  Interventions: . Patient interviewed and appropriate assessments performed . Social Worker updated on status of current investigation by the Department of Social Services in regards to her spouse and the care currently being provided . Continued to provide emotional support allowing patient to vent her frustrations in regards to the report made  . Continued to provide positive reinforcement provided due to confirmation received that  patient has identified a reliable private duty retired Marine scientist to provide patient's spouse with in home care  . Confirmed that the retired Marine scientist met patient's spouse on Saturday and has now agreed to work with him . Followed up on contacts made with a Elder Law attorney encouraged . Self care continues to be strongly encouraged, as stress is now becoming more manageable with the additional help identified and the Social Services investigation coming to a close . Continued to reinforce the importance of open dialogue with social services and taking time for self care including scheduling an  appointment with the employee assistance and  counseling program   Patient Self Care Activities:  . Performs ADL's independently . Performs IADL's independently  Please see past updates related to this goal by clicking on the "Past Updates" button in the selected goal          The patient verbalized understanding of instructions provided today and declined a print copy of patient instruction materials.   Telephone follow up appointment with care management team member scheduled for:03/16/19

## 2019-02-23 NOTE — Chronic Care Management (AMB) (Signed)
  Chronic Care Management    Clinical Social Work Follow Up Note  02/23/2019 Name: Tracey Huff MRN: [id number] DOB: 1970/11/24  Tracey Huff is a 48 y.o. year old female who is a primary care patient of Tracey Hartshorn, FNP. The CCM team was consulted for assistance with Caregiver Stress.   Review of patient status, including review of consultants reports, other relevant assessments, and collaboration with appropriate care team members and the patient's provider was performed as part of comprehensive patient evaluation and provision of chronic care management services.    Advanced Directives Status: <no information> See Care Plan for related entries.   Outpatient Encounter Medications as of 02/23/2019  Medication Sig  . albuterol (PROVENTIL HFA;VENTOLIN HFA) 108 (90 BASE) MCG/ACT inhaler Inhale 1-2 puffs into the lungs every 6 (six) hours as needed for wheezing or shortness of breath.  . budesonide-formoterol (SYMBICORT) 160-4.5 MCG/ACT inhaler Inhale into the lungs.  . busPIRone (BUSPAR) 7.5 MG tablet Take 1 tablet once daily at night; may take 1 additional tablet daily if needed for anxiety.  Marland Kitchen EPINEPHrine 0.15 MG/0.15ML IJ injection Inject into the skin.  Marland Kitchen levocetirizine (XYZAL) 5 MG tablet Take 5 mg by mouth every evening.  Marland Kitchen losartan (COZAAR) 25 MG tablet Take 0.5 tablets (12.5 mg total) by mouth daily.  . montelukast (SINGULAIR) 10 MG tablet Take by mouth.   No facility-administered encounter medications on file as of 02/23/2019.      Goals Addressed            This Visit's Progress   . "I am under alot of stress with caregiving for my husband" (pt-stated)       Current Barriers:  . Family relationship dysfunction . Limited access to caregiver support  Clinical Social Work Clinical Goal(s):  Marland Kitchen Over the next 30 days, client will follow up with the Employee Assistance and Counseling Program * as directed by SW  Interventions: . Patient interviewed and appropriate  assessments performed . Social Worker updated on status of current investigation by the Department of Social Services in regards to her spouse and the care currently being provided . Continued to provide emotional support allowing patient to vent her frustrations in regards to the report made  . Continued to provide positive reinforcement provided due to confirmation received that  patient has identified a reliable private duty retired Marine scientist to provide patient's spouse with in home care  . Confirmed that the retired Marine scientist met patient's spouse on Saturday and has now agreed to work with him . Followed up on contacts made with a Elder Law attorney encouraged . Self care continues to be strongly encouraged, as stress is now becoming more manageable with the additional help identified and the Social Services investigation coming to a close . Continued to reinforce the importance of open dialogue with social services and taking time for self care including scheduling an  appointment with the employee assistance and counseling program   Patient Self Care Activities:  . Performs ADL's independently . Performs IADL's independently  Please see past updates related to this goal by clicking on the "Past Updates" button in the selected goal          Follow Up Plan: SW will follow up with patient by phone over the next 3 weeks   [place], [place] Worker  [facility] [phone]

## 2019-02-25 DIAGNOSIS — J3081 Allergic rhinitis due to animal (cat) (dog) hair and dander: Secondary | ICD-10-CM | POA: Diagnosis not present

## 2019-02-25 DIAGNOSIS — J3089 Other allergic rhinitis: Secondary | ICD-10-CM | POA: Diagnosis not present

## 2019-02-25 DIAGNOSIS — J301 Allergic rhinitis due to pollen: Secondary | ICD-10-CM | POA: Diagnosis not present

## 2019-03-08 ENCOUNTER — Ambulatory Visit: Payer: Self-pay | Admitting: *Deleted

## 2019-03-08 DIAGNOSIS — Z636 Dependent relative needing care at home: Secondary | ICD-10-CM

## 2019-03-08 DIAGNOSIS — F411 Generalized anxiety disorder: Secondary | ICD-10-CM

## 2019-03-08 DIAGNOSIS — F430 Acute stress reaction: Secondary | ICD-10-CM

## 2019-03-08 NOTE — Patient Instructions (Addendum)
Thank you allowing the Chronic Care Management Team to be a part of your care! It was a pleasure speaking with you today!  1. Please call this social worker with any questions or concerns regarding your mental health needs  CCM (Chronic Care Management) Team   Neldon Labella RN, BSN Nurse Care Coordinator  [phone]  Ruben Reason PharmD  Clinical Pharmacist  [phone]   [place], LCSW Clinical Social Worker [phone]  Goals Addressed            This Visit's Progress   . "I am under alot of stress with caregiving for my husband" (pt-stated)       Current Barriers:  . Family relationship dysfunction . Limited access to caregiver support  Clinical Social Work Clinical Goal(s):  Marland Kitchen Over the next 90 days, patient will continue to follow up with this social worker to explore strategies to manage stress related to caregiver strain  Interventions: . Patient interviewed and appropriate assessments performed . Social Worker updated on status of current investigation by the Department of Social Services in regards to her spouse and the care currently being provided . Continued to provide emotional support allowing patient to vent her frustrations in regards to the report made and investigative process that follows . Continued to provide positive reinforcement with effort made to identify a reliable private duty retired Marine scientist to provide patient's spouse with in home care on Saturdays . Continued to reinforce the importance of open dialogue with social services to complete investigation and taking time for self care   Patient Self Care Activities:  . Performs ADL's independently . Performs IADL's independently  Please see past updates related to this goal by clicking on the "Past Updates" button in the selected goal          The patient verbalized understanding of instructions provided today and declined a print copy of patient instruction materials.    Telephone follow up appointment with care management team member scheduled for:03/16/19

## 2019-03-08 NOTE — Chronic Care Management (AMB) (Addendum)
   Care Management    Clinical Social Work Follow Up Note  03/08/2019 Name: Tracey Huff MRN: [id number] DOB: 1970/07/10  Tracey Huff is a 48 y.o. year old female who is a primary care patient of Hubbard Hartshorn, FNP. The CCM team was consulted for assistance with Mental Health Counseling and Resources.   Review of patient status, including review of consultants reports, other relevant assessments, and collaboration with appropriate care team members and the patient's provider was performed as part of comprehensive patient evaluation and provision of chronic care management services.     Advanced Directives Status: <no information> See Care Plan for related entries.   Outpatient Encounter Medications as of 03/08/2019  Medication Sig  . albuterol (PROVENTIL HFA;VENTOLIN HFA) 108 (90 BASE) MCG/ACT inhaler Inhale 1-2 puffs into the lungs every 6 (six) hours as needed for wheezing or shortness of breath.  . budesonide-formoterol (SYMBICORT) 160-4.5 MCG/ACT inhaler Inhale into the lungs.  . busPIRone (BUSPAR) 7.5 MG tablet Take 1 tablet once daily at night; may take 1 additional tablet daily if needed for anxiety.  Marland Kitchen EPINEPHrine 0.15 MG/0.15ML IJ injection Inject into the skin.  Marland Kitchen levocetirizine (XYZAL) 5 MG tablet Take 5 mg by mouth every evening.  Marland Kitchen losartan (COZAAR) 25 MG tablet Take 0.5 tablets (12.5 mg total) by mouth daily.  . montelukast (SINGULAIR) 10 MG tablet Take by mouth.   No facility-administered encounter medications on file as of 03/08/2019.      Goals Addressed            This Visit's Progress   . "I am under alot of stress with caregiving for my husband" (pt-stated)       Current Barriers:  . Family relationship dysfunction . Limited access to caregiver support  Clinical Social Work Clinical Goal(s):  Marland Kitchen Over the next 90 days, patient will continue to follow up with this social worker to explore strategies to manage stress related to caregiver strain   Interventions: . Patient interviewed and appropriate assessments performed . Social Worker updated on status of current investigation by the Department of Social Services in regards to her spouse and the care currently being provided . Continued to provide emotional support allowing patient to vent her frustrations in regards to the report made and investigative process that follows . Continued to provide positive reinforcement with effort made to identify a reliable private duty retired Marine scientist to provide patient's spouse with in home care on Saturdays . Continued to reinforce the importance of open dialogue with social services to complete investigation and taking time for self care   Patient Self Care Activities:  . Performs ADL's independently . Performs IADL's independently  Please see past updates related to this goal by clicking on the "Past Updates" button in the selected goal          Follow Up Plan: SW will follow up with patient by phone over the next 2 weeks   [place], [place] Worker  [facility] [phone]

## 2019-03-12 DIAGNOSIS — J3089 Other allergic rhinitis: Secondary | ICD-10-CM | POA: Diagnosis not present

## 2019-03-12 DIAGNOSIS — J301 Allergic rhinitis due to pollen: Secondary | ICD-10-CM | POA: Diagnosis not present

## 2019-03-12 DIAGNOSIS — J3081 Allergic rhinitis due to animal (cat) (dog) hair and dander: Secondary | ICD-10-CM | POA: Diagnosis not present

## 2019-03-16 ENCOUNTER — Ambulatory Visit: Payer: Self-pay | Admitting: *Deleted

## 2019-03-16 ENCOUNTER — Telehealth: Payer: Self-pay | Admitting: *Deleted

## 2019-03-16 DIAGNOSIS — Z636 Dependent relative needing care at home: Secondary | ICD-10-CM

## 2019-03-16 DIAGNOSIS — F411 Generalized anxiety disorder: Secondary | ICD-10-CM

## 2019-03-16 DIAGNOSIS — F430 Acute stress reaction: Secondary | ICD-10-CM

## 2019-03-16 NOTE — Patient Instructions (Signed)
Thank you allowing the Chronic Care Management Team to be a part of your care! It was a pleasure speaking with you today!  1. Please call this social worker with any questions or concerns regarding your community/mental health needs.  CCM (Chronic Care Management) Team   Neldon Labella  RN, BSN Nurse Care Coordinator  [phone]  Ruben Reason PharmD  Clinical Pharmacist  [phone]   Elliot Gurney, LCSW Clinical Social Worker [phone]  Goals Addressed            This Visit's Progress   . "I am under alot of stress with caregiving for my husband" (pt-stated)       Current Barriers:  . Family relationship dysfunction . Limited access to caregiver support  Clinical Social Work Clinical Goal(s):  Marland Kitchen Over the next 90 days, patient will continue to follow up with this social worker to explore strategies to manage stress related to caregiver strain  Interventions: . Patient interviewed and appropriate assessments performed . Social Worker updated on status of current investigation by the Department of Social Services  . Continued to provide emotional support allowing patient to vent her frustrations in regards to the report made and investigative process that follows . Continued to promote self care practices by utilizing private duty aid for respite     Patient Self Care Activities:  . Performs ADL's independently . Performs IADL's independently  Please see past updates related to this goal by clicking on the "Past Updates" button in the selected goal          The patient verbalized understanding of instructions provided today and declined a print copy of patient instruction materials.   Telephone follow up appointment with care management team member scheduled for:03/29/19

## 2019-03-16 NOTE — Chronic Care Management (AMB) (Signed)
  Care Management   Unsuccessful Call Note 03/16/2019 Name: Tracey Huff MRN: [id number] DOB: 1970-08-22  Patient is a 48 year old female who sees Raelyn Ensign, FNP for primary care. Raelyn Ensign FNP asked the CCM team to consult the patient for Caregiver strain.    This social worker was unable to reach patient via telephone today for follow up call. I have left HIPAA compliant voicemail asking patient to return my call. (unsuccessful outreach #1).   Plan: Will follow-up within 7 business days via telephone.     Elliot Gurney, [place] Administrator, [facility] [phone]

## 2019-03-16 NOTE — Chronic Care Management (AMB) (Signed)
  Chronic Care Management    Clinical Social Work Follow Up Note  03/16/2019 Name: Tracey Huff MRN: [id number] DOB: August 26, 1970  Tracey Huff is a 48 y.o. year old female who is a primary care patient of Tracey Hartshorn, FNP. The CCM team was consulted for assistance with Caregiver Stress.   Review of patient status, including review of consultants reports, other relevant assessments, and collaboration with appropriate care team members and the patient's provider was performed as part of comprehensive patient evaluation and provision of chronic care management services.    Advanced Directives Status: <no information> See Care Plan for related entries.   Outpatient Encounter Medications as of 03/16/2019  Medication Sig  . albuterol (PROVENTIL HFA;VENTOLIN HFA) 108 (90 BASE) MCG/ACT inhaler Inhale 1-2 puffs into the lungs every 6 (six) hours as needed for wheezing or shortness of breath.  . budesonide-formoterol (SYMBICORT) 160-4.5 MCG/ACT inhaler Inhale into the lungs.  . busPIRone (BUSPAR) 7.5 MG tablet Take 1 tablet once daily at night; may take 1 additional tablet daily if needed for anxiety.  Marland Kitchen EPINEPHrine 0.15 MG/0.15ML IJ injection Inject into the skin.  Marland Kitchen levocetirizine (XYZAL) 5 MG tablet Take 5 mg by mouth every evening.  Marland Kitchen losartan (COZAAR) 25 MG tablet Take 0.5 tablets (12.5 mg total) by mouth daily.  . montelukast (SINGULAIR) 10 MG tablet Take by mouth.   No facility-administered encounter medications on file as of 03/16/2019.      Goals Addressed            This Visit's Progress   . "I am under alot of stress with caregiving for my husband" (pt-stated)       Current Barriers:  . Family relationship dysfunction . Limited access to caregiver support  Clinical Social Work Clinical Goal(s):  Marland Kitchen Over the next 90 days, patient will continue to follow up with this social worker to explore strategies to manage stress related to caregiver strain  Interventions: .  Patient interviewed and appropriate assessments performed . Social Worker updated on status of current investigation by the Department of Social Services  . Continued to provide emotional support allowing patient to vent her frustrations in regards to the report made and investigative process that follows . Continued to promote self care practices by utilizing private duty aid for respite     Patient Self Care Activities:  . Performs ADL's independently . Performs IADL's independently  Please see past updates related to this goal by clicking on the "Past Updates" button in the selected goal          Follow Up Plan: SW will follow up with patient by phone over the next 2 weeks   [place], [place] Worker  [facility] [phone]

## 2019-03-19 DIAGNOSIS — J3081 Allergic rhinitis due to animal (cat) (dog) hair and dander: Secondary | ICD-10-CM | POA: Diagnosis not present

## 2019-03-19 DIAGNOSIS — J301 Allergic rhinitis due to pollen: Secondary | ICD-10-CM | POA: Diagnosis not present

## 2019-03-19 DIAGNOSIS — J3089 Other allergic rhinitis: Secondary | ICD-10-CM | POA: Diagnosis not present

## 2019-03-22 ENCOUNTER — Telehealth: Payer: Self-pay

## 2019-03-25 DIAGNOSIS — J3081 Allergic rhinitis due to animal (cat) (dog) hair and dander: Secondary | ICD-10-CM | POA: Diagnosis not present

## 2019-03-25 DIAGNOSIS — J301 Allergic rhinitis due to pollen: Secondary | ICD-10-CM | POA: Diagnosis not present

## 2019-03-25 DIAGNOSIS — J3089 Other allergic rhinitis: Secondary | ICD-10-CM | POA: Diagnosis not present

## 2019-03-29 ENCOUNTER — Ambulatory Visit: Payer: Self-pay | Admitting: *Deleted

## 2019-03-29 ENCOUNTER — Telehealth: Payer: Self-pay

## 2019-03-29 DIAGNOSIS — F411 Generalized anxiety disorder: Secondary | ICD-10-CM

## 2019-03-29 DIAGNOSIS — Z636 Dependent relative needing care at home: Secondary | ICD-10-CM

## 2019-03-29 NOTE — Chronic Care Management (AMB) (Signed)
   Chronic Care Management   Unsuccessful Call Note 03/29/2019 Name: Tracey Huff MRN: [id number] DOB: 1971/04/24  Patient  is a 48 year old female who sees Raelyn Ensign, FNP for primary care. Raelyn Ensign, FNP asked the CCM team to consult the patient for Caregiver Strain.    This social worker was unable to reach patient via telephone today for follow up call. I have left HIPAA compliant voicemail asking patient to return my call. (unsuccessful outreach #1).   Plan: Will follow-up within 7 business days via telephone.     Elliot Gurney, [place] Administrator, [facility] [phone]

## 2019-03-29 NOTE — Patient Instructions (Signed)
Thank you allowing the Chronic Care Management Team to be a part of your care! It was a pleasure speaking with you today!  1. Please consider calling the EACP program through your employer to schedule an appointment 2. Please call this social worker with any questions or concerns regarding your mental health or community resource needs.  CCM (Chronic Care Management) Team   Neldon Labella RN, BSN Nurse Care Coordinator  [phone]  Ruben Reason PharmD  Clinical Pharmacist  [phone]   Elliot Gurney, LCSW Clinical Social Worker [phone]  Goals Addressed            This Visit's Progress   . "I am under alot of stress with caregiving for my husband" (pt-stated)       Current Barriers:  . Family relationship dysfunction . Limited access to caregiver support  Clinical Social Work Clinical Goal(s):  Marland Kitchen Over the next 90 days, patient will continue to follow up with this social worker to explore strategies to manage stress related to caregiver strain  Interventions: . Patient interviewed and appropriate assessments performed . Social Worker updated on status of current investigation by the Department of Social Services, per patient there has been no further contact with the Education officer, museum so far . Patient discussed receiving some difficult news from her spouse.  . Processed patient's feelings regarding this news offering alternative perspectives to consider . Continued to promote self care practices by utilizing the private duty aid for respite . Continued to provide emotional support allowing patient to vent her frustrations in regards to caregiver strain experienced . Continued to recommend use of the EACP program for continued mental health support    Patient Self Care Activities:  . Performs ADL's independently . Performs IADL's independently  Please see past updates related to this goal by clicking on the "Past Updates" button in the selected goal           The patient verbalized understanding of instructions provided today and declined a print copy of patient instruction materials.   Telephone follow up appointment with care management team member scheduled for: 04/12/19

## 2019-03-29 NOTE — Chronic Care Management (AMB) (Signed)
.    Care Management    Clinical Social Work Follow Up Note  03/29/2019 Name: Tracey Huff MRN: [id number] DOB: [birth year]  Tracey Huff is a 48 y.o. year old female who is a primary care patient of Hubbard Hartshorn, FNP. The CCM team was consulted for assistance with Mental Health Counseling and Resources.   Review of patient status, including review of consultants reports, other relevant assessments, and collaboration with appropriate care team members and the patient's provider was performed as part of comprehensive patient evaluation and provision of chronic care management services.    Advanced Directives Status: <no information> See Care Plan for related entries.   Outpatient Encounter Medications as of 03/29/2019  Medication Sig  . albuterol (PROVENTIL HFA;VENTOLIN HFA) 108 (90 BASE) MCG/ACT inhaler Inhale 1-2 puffs into the lungs every 6 (six) hours as needed for wheezing or shortness of breath.  . budesonide-formoterol (SYMBICORT) 160-4.5 MCG/ACT inhaler Inhale into the lungs.  . busPIRone (BUSPAR) 7.5 MG tablet Take 1 tablet once daily at night; may take 1 additional tablet daily if needed for anxiety.  Marland Kitchen EPINEPHrine 0.15 MG/0.15ML IJ injection Inject into the skin.  Marland Kitchen levocetirizine (XYZAL) 5 MG tablet Take 5 mg by mouth every evening.  Marland Kitchen losartan (COZAAR) 25 MG tablet Take 0.5 tablets (12.5 mg total) by mouth daily.  . montelukast (SINGULAIR) 10 MG tablet Take by mouth.   No facility-administered encounter medications on file as of 03/29/2019.      Goals Addressed            This Visit's Progress   . "I am under alot of stress with caregiving for my husband" (pt-stated)       Current Barriers:  . Family relationship dysfunction . Limited access to caregiver support  Clinical Social Work Clinical Goal(s):  Marland Kitchen Over the next 90 days, patient will continue to follow up with this social worker to explore strategies to manage stress related to caregiver strain   Interventions: . Patient interviewed and appropriate assessments performed . Social Worker updated on status of current investigation by the Department of Social Services, per patient there has been no further contact with the Education officer, museum so far . Patient discussed receiving some difficult news from her spouse.  . Processed patient's feelings regarding this news offering alternative perspectives to consider . Continued to promote self care practices by utilizing the private duty aid for respite . Continued to provide emotional support allowing patient to vent her frustrations in regards to caregiver strain experienced . Continued to recommend use of the EACP program for continued mental health support    Patient Self Care Activities:  . Performs ADL's independently . Performs IADL's independently  Please see past updates related to this goal by clicking on the "Past Updates" button in the selected goal          Follow Up Plan: SW will follow up with patient by phone over the next 2 weeks   [place], [place] Worker  [facility] Care Management [phone]

## 2019-04-05 ENCOUNTER — Telehealth: Payer: Self-pay

## 2019-04-08 DIAGNOSIS — J3081 Allergic rhinitis due to animal (cat) (dog) hair and dander: Secondary | ICD-10-CM | POA: Diagnosis not present

## 2019-04-08 DIAGNOSIS — J4530 Mild persistent asthma, uncomplicated: Secondary | ICD-10-CM | POA: Diagnosis not present

## 2019-04-08 DIAGNOSIS — H1045 Other chronic allergic conjunctivitis: Secondary | ICD-10-CM | POA: Diagnosis not present

## 2019-04-08 DIAGNOSIS — J3089 Other allergic rhinitis: Secondary | ICD-10-CM | POA: Diagnosis not present

## 2019-04-08 DIAGNOSIS — J301 Allergic rhinitis due to pollen: Secondary | ICD-10-CM | POA: Diagnosis not present

## 2019-04-08 MED FILL — MONTELUKAST SOD 10 MG TAB: 10 | 30 days supply | Qty: 30 | Fill #0

## 2019-04-08 MED FILL — SYMBICORT 160-4.5 MCG INH: 160-4.5 | 30 days supply | Qty: 10 | Fill #0

## 2019-04-08 MED FILL — AZELASTINE HCL 137 MCG SPRY: 0.1 | 30 days supply | Qty: 30 | Fill #0

## 2019-04-08 MED FILL — LEVOCETIRIZINE 5 MG TABLET: 5 | 30 days supply | Qty: 30 | Fill #0

## 2019-04-08 MED FILL — FLUTICASONE PROP 50 MCG SPR: 50 | 30 days supply | Qty: 16 | Fill #0

## 2019-04-08 MED FILL — ALBUTEROL SULFATE HFA 108 (: 108 (90 BAS | 16 days supply | Qty: 18 | Fill #0

## 2019-04-12 ENCOUNTER — Ambulatory Visit: Payer: Self-pay | Admitting: *Deleted

## 2019-04-12 DIAGNOSIS — F430 Acute stress reaction: Secondary | ICD-10-CM

## 2019-04-12 DIAGNOSIS — F411 Generalized anxiety disorder: Secondary | ICD-10-CM

## 2019-04-12 DIAGNOSIS — Z636 Dependent relative needing care at home: Secondary | ICD-10-CM

## 2019-04-12 NOTE — Patient Instructions (Signed)
Thank you allowing the Chronic Care Management Team to be a part of your care! It was a pleasure speaking with you today!  1. Please call this social worker with any questions or concerns regarding your mental health needs 2. Please contact the EACP through your employer for possible follow up.  CCM (Chronic Care Management) Team   Neldon Labella  RN, BSN Nurse Care Coordinator  [phone]  Ruben Reason PharmD  Clinical Pharmacist  [phone]   Elliot Gurney, LCSW Clinical Social Worker [phone]  Goals Addressed            This Visit's Progress   . "I am under alot of stress with caregiving for my husband" (pt-stated)       Current Barriers:  . Family relationship dysfunction . Limited access to caregiver support  Clinical Social Work Clinical Goal(s):  Marland Kitchen Over the next 90 days, patient will continue to follow up with this social worker to explore strategies to manage stress related to caregiver strain  Interventions: . Patient interviewed and appropriate assessments performed . Updated on status of current investigation by the Department of Social Services, per patient there has been no further contact with the social worker todate . Encouraged patient to contact the Department of Social Services regarding status of case . Patient's relationship with spouse explored as well as status of his new treatments . Continued to promote self care practices by utilizing the private duty aid for respite . Provided positive reinforcement for self care strategies used this past week . Continued to provide emotional support allowing patient to vent her frustrations in regards to caregiver strain experienced . Continued to recommend use of the EACP program for both herself and her spouse for ongoing mental health support    Patient Self Care Activities:  . Performs ADL's independently . Performs IADL's independently  Please see past updates related to this goal by  clicking on the "Past Updates" button in the selected goal          The patient verbalized understanding of instructions provided today and declined a print copy of patient instruction materials.   Telephone follow up appointment with care management team member scheduled for:05/10/19

## 2019-04-12 NOTE — Chronic Care Management (AMB) (Signed)
  Care Management    Clinical Social Work Follow Up Note  04/12/2019 Name: Tracey Huff MRN: [id number] DOB: [birth year]  Tracey Huff is a 48 y.o. year old female who is a primary care patient of Tracey Hartshorn, FNP. The CCM team was consulted for assistance with Mental Health Counseling and Resources.   Review of patient status, including review of consultants reports, other relevant assessments, and collaboration with appropriate care team members and the patient's provider was performed as part of comprehensive patient evaluation and provision of chronic care management services.    Advanced Directives Status: <no information> See Care Plan for related entries.   Outpatient Encounter Medications as of 04/12/2019  Medication Sig  . albuterol (PROVENTIL HFA;VENTOLIN HFA) 108 (90 BASE) MCG/ACT inhaler Inhale 1-2 puffs into the lungs every 6 (six) hours as needed for wheezing or shortness of breath.  . budesonide-formoterol (SYMBICORT) 160-4.5 MCG/ACT inhaler Inhale into the lungs.  . busPIRone (BUSPAR) 7.5 MG tablet Take 1 tablet once daily at night; may take 1 additional tablet daily if needed for anxiety.  Marland Kitchen EPINEPHrine 0.15 MG/0.15ML IJ injection Inject into the skin.  Marland Kitchen levocetirizine (XYZAL) 5 MG tablet Take 5 mg by mouth every evening.  Marland Kitchen losartan (COZAAR) 25 MG tablet Take 0.5 tablets (12.5 mg total) by mouth daily.  . montelukast (SINGULAIR) 10 MG tablet Take by mouth.   No facility-administered encounter medications on file as of 04/12/2019.      Goals Addressed            This Visit's Progress   . "I am under alot of stress with caregiving for my husband" (pt-stated)       Current Barriers:  . Family relationship dysfunction . Limited access to caregiver support  Clinical Social Work Clinical Goal(s):  Marland Kitchen Over the next 90 days, patient will continue to follow up with this social worker to explore strategies to manage stress related to caregiver strain   Interventions: . Patient interviewed and appropriate assessments performed . Updated on status of current investigation by the Department of Social Services, per patient there has been no further contact with the social worker todate . Encouraged patient to contact the Department of Social Services regarding status of case . Patient's relationship with spouse explored as well as status of his new treatments . Continued to promote self care practices by utilizing the private duty aid for respite . Provided positive reinforcement for self care strategies used this past week . Continued to provide emotional support allowing patient to vent her frustrations in regards to caregiver strain experienced . Continued to recommend use of the EACP program for both herself and her spouse for ongoing mental health support    Patient Self Care Activities:  . Performs ADL's independently . Performs IADL's independently  Please see past updates related to this goal by clicking on the "Past Updates" button in the selected goal          Follow Up Plan: SW will follow up with patient by phone over the next  2 weeks   [place], [place] Worker  [facility] Care Management [phone] \

## 2019-04-22 DIAGNOSIS — J301 Allergic rhinitis due to pollen: Secondary | ICD-10-CM | POA: Diagnosis not present

## 2019-04-22 DIAGNOSIS — J3089 Other allergic rhinitis: Secondary | ICD-10-CM | POA: Diagnosis not present

## 2019-04-22 DIAGNOSIS — J3081 Allergic rhinitis due to animal (cat) (dog) hair and dander: Secondary | ICD-10-CM | POA: Diagnosis not present

## 2019-04-27 DIAGNOSIS — J301 Allergic rhinitis due to pollen: Secondary | ICD-10-CM | POA: Diagnosis not present

## 2019-04-27 DIAGNOSIS — J3081 Allergic rhinitis due to animal (cat) (dog) hair and dander: Secondary | ICD-10-CM | POA: Diagnosis not present

## 2019-04-27 DIAGNOSIS — J3089 Other allergic rhinitis: Secondary | ICD-10-CM | POA: Diagnosis not present

## 2019-05-03 DIAGNOSIS — J301 Allergic rhinitis due to pollen: Secondary | ICD-10-CM | POA: Diagnosis not present

## 2019-05-04 DIAGNOSIS — J3081 Allergic rhinitis due to animal (cat) (dog) hair and dander: Secondary | ICD-10-CM | POA: Diagnosis not present

## 2019-05-04 DIAGNOSIS — J3089 Other allergic rhinitis: Secondary | ICD-10-CM | POA: Diagnosis not present

## 2019-05-04 DIAGNOSIS — J301 Allergic rhinitis due to pollen: Secondary | ICD-10-CM | POA: Diagnosis not present

## 2019-05-05 ENCOUNTER — Other Ambulatory Visit: Payer: Self-pay

## 2019-05-05 ENCOUNTER — Inpatient Hospital Stay: Payer: 59 | Attending: Obstetrics and Gynecology | Admitting: Obstetrics and Gynecology

## 2019-05-05 VITALS — BP 155/101 | HR 75 | Temp 97.9°F | Resp 16 | Wt 170.4 lb

## 2019-05-05 DIAGNOSIS — D069 Carcinoma in situ of cervix, unspecified: Secondary | ICD-10-CM

## 2019-05-05 NOTE — Progress Notes (Signed)
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 P2 female who is seen in consultation from Dr. Glennon Mac for follow up CIN-2/3.   She presents for interval visit and post-procedure visit. She underwent colposcopy and laser procedure 01/29/2019 at [place] with Dr. Fransisca Connors. Procedure as noted below: Vaginal/cervical colposcopy using 3% acetic acid was performed revealing a small cervical remnant noted along left vaginal apex/cuff; otherwise normal appearing vaginal mucosa. The laser was initially set to 5 watts and subsequently increased up to 10 watts.  The posterior vagina was ablated to a depth of just over a millimeter in a broad geographic distribution to cover the entire posterior, apical vagina and cervical remnant.  She had one episode of spotting after surgery which resolved. No other bleeding, pain or discharge. She feels well and denies complaints. She continues to care for husband   Gynecologic Oncology History Tracey Huff is a pleasant P2 female s/p TH/BSx for menorrhagia initially referred to Dr. Glennon Mac from Raelyn Ensign, FNP/[facility] for ASCUS with positive high-risk HPV. Her history is as follows:  She underwent total abdominal hysterectomy (11/15/2014 with Dr. Glennon Mac) with bilateral salpingectomy and cystoscopy for menorrhagia with regular cycles and fibroid uterus.   Final pathology from 11/15/2014 showed  A. 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  Her 6-week postop exam indicated no  cervix.  07/03/18 ASCUS with positive high-risk HPV- Pap smear result showed endocervical/transformation zone present. HIV nonreactive. RPR nonreactive. Gonorrhea/Chlamydia- negative.   Colposcopy was performed on 07/28/2018 Pathology: 1.  Cervix, biopsy, 5:00: -High-grade squamous intraepithelial lesion (CIN-2-3, high-grade dysplasia) 2.  Cervix, biopsy, 8:00 -Low-grade squamous intraepithelial lesion (CIN-1, low-grade dysplasia) 3.  Endocervix, curettage -Low-grade squamous intraepithelial lesion (CIN-1, low-grade dysplasia)  Per Dr. Glennon Mac, suspicion of cervical remnant.    Ultrasound on 12/17/2018-  Adnexa: No masses noted.  Only one ovary definitively noted in the midline measuring 2.9 x 1.9 x 1.7 cm.  No free fluid in the cul-de-sac. Uterus: Surgically absent Additional: No definitive cervix noted.  Hyperechoic density noted between the vaginal wall and bladder of uncertain significance.  It appears well-circumscribed.  No bleeding or discharge.  She was referred to Gynecologic Oncology and saw Dr. Fransisca Connors on 12/30/2018. Colposcopy of cervix remnant done and shows small area of faint AWE without vascular changes at 6 o'clock. Discussed options for management including excision or destruction of CIN including vaginal trachelectomy and laser ablation of the CIN and then follow up PAPs.  If subsequently dysplasia is persistent can do an excisional procedure. She opted for outpatient laser ablation.  Problem List: Patient Active Problem List   Diagnosis Date Noted  . CIN III (cervical intraepithelial neoplasia grade III) with severe dysplasia 08/11/2018  . Atypical squamous cell changes of undetermined significance (ASCUS) on cervical cytology with positive high risk human papilloma virus (HPV) 06/29/2018  . Prediabetes 06/24/2018  . Class 1 obesity due to excess calories with serious comorbidity and body mass index (BMI) of 33.0 to 33.9 in adult 05/20/2018  . Essential hypertension  05/20/2018  . Hyperlipidemia 05/20/2018  . Asthma 05/20/2018  .  Anxiety in acute stress reaction 05/20/2018  . Status post hysterectomy 11/15/2014    Past Medical History: Past Medical History:  Diagnosis Date  . Allergy   . Asthma   . Fibroid uterus 11/15/2014  . Headache   . Hypertension   . Menorrhagia with irregular cycle 11/15/2014  . Shortness of breath dyspnea     Past Surgical History: Past Surgical History:  Procedure Laterality Date  . ABDOMINAL HYSTERECTOMY N/A 11/15/2014   Procedure: HYSTERECTOMY ABDOMINAL/BILATERAL SALPINGECTOMY ;  Surgeon: Will Bonnet, MD;  Location: [facility] ORS;  Service: Gynecology;  Laterality: N/A;  . CESAREAN SECTION    . CYSTOSCOPY N/A 11/15/2014   Procedure: CYSTOSCOPY;  Surgeon: Will Bonnet, MD;  Location: [facility] ORS;  Service: Gynecology;  Laterality: N/A;  . DILATION AND CURETTAGE OF UTERUS    . GANGLION CYST EXCISION Left   . UTERINE FIBROID SURGERY     OB History:  OB History  No obstetric history on file.   Family History: Family History  Problem Relation Age of Onset  . Congestive Heart Failure Mother   . Diabetes Mother   . Hypertension Mother   . Diabetes Maternal Grandmother   . Congestive Heart Failure Maternal Grandfather    Social History: Social History   Socioeconomic History  . Marital status: Married    Spouse name: Fritz Pickerel  . Number of children: 2  . Years of education: Not on file  . Highest education level: Not on file  Occupational History  . Not on file  Tobacco Use  . Smoking status: Never Smoker  . Smokeless tobacco: Never Used  Substance and Sexual Activity  . Alcohol use: Not Currently    Comment: occ  . Drug use: No  . Sexual activity: Yes    Partners: Male    Birth control/protection: Surgical  Other Topics Concern  . Not on file  Social History Narrative  . Not on file   Social Determinants of Health   Financial Resource Strain:   . Difficulty of Paying Living Expenses: Not on  file  Food Insecurity: No Food Insecurity  . Worried About Charity fundraiser in the Last Year: Never true  . Ran Out of Food in the Last Year: Never true  Transportation Needs: No Transportation Needs  . Lack of Transportation (Medical): No  . Lack of Transportation (Non-Medical): No  Physical Activity: Insufficiently Active  . Days of Exercise per Week: 3 days  . Minutes of Exercise per Session: 30 min  Stress: Stress Concern Present  . Feeling of Stress : Very much  Social Connections: Slightly Isolated  . Frequency of Communication with Friends and Family: More than three times a week  . Frequency of Social Gatherings with Friends and Family: More than three times a week  . Attends Religious Services: More than 4 times per year  . Active Member of Clubs or Organizations: No  . Attends Archivist Meetings: Never  . Marital Status: Married  Human resources officer Violence: Not At Risk  . Fear of Current or Ex-Partner: No  . Emotionally Abused: No  . Physically Abused: No  . Sexually Abused: No   Allergies: Allergies  Allergen Reactions  . Macadamia Nut Oil Shortness Of Breath  . Apple Nausea And Vomiting  . Fruit & Vegetable Daily [Nutritional Supplements] Nausea And Vomiting    Cannot tolerate apples, peaches, plums, and nectarines  . Kiwi Extract Nausea And Vomiting  . Peach [Prunus Persica] Nausea And Vomiting  Current Medications: Current Outpatient Medications  Medication Sig Dispense Refill  . albuterol (PROVENTIL HFA;VENTOLIN HFA) 108 (90 BASE) MCG/ACT inhaler Inhale 1-2 puffs into the lungs every 6 (six) hours as needed for wheezing or shortness of breath.    . budesonide-formoterol (SYMBICORT) 160-4.5 MCG/ACT inhaler Inhale into the lungs.    . busPIRone (BUSPAR) 7.5 MG tablet Take 1 tablet once daily at night; may take 1 additional tablet daily if needed for anxiety. 180 tablet 1  . Cholecalciferol 25 MCG (1000 UT) capsule Take by mouth.    . EPINEPHrine  0.15 MG/0.15ML IJ injection Inject into the skin.    Marland Kitchen levocetirizine (XYZAL) 5 MG tablet Take 5 mg by mouth every evening.    Marland Kitchen losartan (COZAAR) 25 MG tablet Take 0.5 tablets (12.5 mg total) by mouth daily. 90 tablet 1  . montelukast (SINGULAIR) 10 MG tablet Take by mouth.     No current facility-administered medications for this visit.   Review of Systems General: no complaints  HEENT: no complaints  Lungs: no complaints  Cardiac: no complaints  GI: no complaints  GU: no complaints  Musculoskeletal: no complaints  Extremities: no complaints  Skin: no complaints  Neuro: no complaints  Endocrine: no complaints  Psych: no complaints      Objective:  Physical Examination:  There were no vitals filed for this visit. There is no height or weight on file to calculate BMI. LMP 11/15/2014     ECOG Performance Status: 0 - Asymptomatic  GENERAL: Patient is a well appearing female in no acute distress HEENT:  PERRL, neck supple with midline trachea. Thyroid without masses.  NODES:  No cervical, supraclavicular, axillary, or inguinal lymphadenopathy palpated.  ABDOMEN:  Soft, nontender.  Positive, normoactive bowel sounds.  EXTREMITIES:  No peripheral edema.   SKIN:  Clear with no obvious rashes or skin changes. No nail dyscrasia. NEURO:  Nonfocal. Well oriented.  Appropriate affect.  Pelvic: EGBUS: no lesions Cervix: no obvious residual cervix or os. No lesions on visual exam. On palpation there is a small nubbin of residual cervix measuring ~ 1 cm Vagina: no lesions, no discharge or bleeding Uterus: surgically absent Adnexa: no palpable masses Rectovaginal: deferred   Assessment:  Tracey Huff is a 48 y.o. female s/p prior TH/BSx for fibroids diagnosed with cervical remnant and CIN2-3 on colposcopy/biopsy.  01/29/2019 laser ablation of the posterior vagina, apical vagina and cervical remnant. Exam negative.   Medical co-morbidities complicating care: HTN.  Plan:    Problem List Items Addressed This Visit      Genitourinary   CIN III (cervical intraepithelial neoplasia grade III) with severe dysplasia - Primary     Plan to follow up with Dr. Fransisca Connors in 9 months for repeat Pap/HPV.   [name] diagnosis, an outline of the further diagnostic and laboratory studies which will be required, the recommendation for surgery, and alternatives were discussed with her and her accompanying family members.  All questions were answered to their satisfaction.   Verlon Au, NP  I personally had a face to face interaction and evaluated the patient jointly with the NP, Ms. Beckey Rutter.  I have reviewed her history and available records and have performed the key portions of the physical exam including General, abdominal exam, pelvic exam with my findings confirming those documented above by the APP.  I have discussed the case with the APP and the patient.  I agree with the above documentation, assessment and plan which was fully formulated  by me.  Counseling was completed by me.   I personally saw the patient and performed a substantive portion of this encounter in conjunction with the listed APP as documented above.  [place] Gaetana Michaelis, MD    CC:  Referring Provider: Dr Glennon Mac

## 2019-05-05 NOTE — Progress Notes (Signed)
Pt in for 3 month follow up, denies any concerns today. 

## 2019-05-10 ENCOUNTER — Telehealth: Payer: Self-pay | Admitting: *Deleted

## 2019-05-11 ENCOUNTER — Ambulatory Visit: Payer: Self-pay | Admitting: *Deleted

## 2019-05-11 DIAGNOSIS — F411 Generalized anxiety disorder: Secondary | ICD-10-CM

## 2019-05-11 DIAGNOSIS — Z636 Dependent relative needing care at home: Secondary | ICD-10-CM

## 2019-05-11 NOTE — Patient Instructions (Signed)
Thank you allowing the Chronic Care Management Team to be a part of your care! It was a pleasure speaking with you today!  1. Please contact the [facility] to schedule your initial appointment  CCM (Chronic Care Management) Team   Neldon Labella RN, BSN Nurse Care Coordinator  [phone]  Ruben Reason PharmD  Clinical Pharmacist  [phone]   [place], LCSW Clinical Social Worker [phone]  Goals Addressed            This Visit's Progress   . "I am under alot of stress with caregiving for my husband" (pt-stated)       Current Barriers:  . Family relationship dysfunction . Limited access to caregiver support  Clinical Social Work Clinical Goal(s):  Marland Kitchen Over the next 90 days, patient will continue to follow up with this social worker to explore strategies to manage stress related to caregiver strain  Interventions: . Patient interviewed and appropriate assessments performed . Updated on status of current investigation by the Department of Social Services, per patient there has been no further contact with the social worker to date . Patient's relationship with spouse explored as well as status of his new treatments . Continued to promote self care practices by utilizing the private duty aid for respite . Provided positive reinforcement for self care strategies used including spending quality time with family and utilizing in home aid when needed . Continued to provide emotional support allowing patient to vent her frustrations in regards to caregiver strain experienced . Continued to recommend use of the EACP program for both herself and her spouse for ongoing mental health support  . Discussed concerns with patient's husband ability to make decisions for himself-options explored.    Patient Self Care Activities:  . Performs ADL's independently . Performs IADL's independently  Please see past updates related to this goal by clicking on the "Past Updates"  button in the selected goal          The patient verbalized understanding of instructions provided today and declined a print copy of patient instruction materials.   The patient will call the [facility] * as advised to schedule an initial appointment for herself and her spouse.

## 2019-05-11 NOTE — Chronic Care Management (AMB) (Signed)
Care Management    Clinical Social Work Follow Up Note  05/11/2019 Name: Tracey Huff MRN: [id number] DOB: April 30, 1971  Tracey Huff is a 49 y.o. year old female who is a primary care patient of Hubbard Hartshorn, FNP. The CCM team was consulted for assistance with Mental Health Counseling and Resources.   Review of patient status, including review of consultants reports, other relevant assessments, and collaboration with appropriate care team members and the patient's provider was performed as part of comprehensive patient evaluation and provision of chronic care management services.    Advanced Directives Status: <no information> See Care Plan for related entries.   Outpatient Encounter Medications as of 05/11/2019  Medication Sig  . albuterol (PROVENTIL HFA;VENTOLIN HFA) 108 (90 BASE) MCG/ACT inhaler Inhale 1-2 puffs into the lungs every 6 (six) hours as needed for wheezing or shortness of breath.  . budesonide-formoterol (SYMBICORT) 160-4.5 MCG/ACT inhaler Inhale into the lungs.  . busPIRone (BUSPAR) 7.5 MG tablet Take 1 tablet once daily at night; may take 1 additional tablet daily if needed for anxiety.  . Cholecalciferol 25 MCG (1000 UT) capsule Take by mouth.  . EPINEPHrine 0.15 MG/0.15ML IJ injection Inject into the skin.  Marland Kitchen levocetirizine (XYZAL) 5 MG tablet Take 5 mg by mouth every evening.  Marland Kitchen losartan (COZAAR) 25 MG tablet Take 0.5 tablets (12.5 mg total) by mouth daily.  . montelukast (SINGULAIR) 10 MG tablet Take by mouth.   No facility-administered encounter medications on file as of 05/11/2019.     Goals Addressed            This Visit's Progress   . "I am under alot of stress with caregiving for my husband" (pt-stated)       Current Barriers:  . Family relationship dysfunction . Limited access to caregiver support  Clinical Social Work Clinical Goal(s):  Marland Kitchen Over the next 90 days, patient will continue to follow up with this social worker to explore strategies to  manage stress related to caregiver strain  Interventions: . Patient interviewed and appropriate assessments performed . Updated on status of current investigation by the Department of Social Services, per patient there has been no further contact with the social worker to date . Patient's relationship with spouse explored as well as status of his new treatments . Continued to promote self care practices by utilizing the private duty aid for respite . Provided positive reinforcement for self care strategies used including spending quality time with family and utilizing in home aid when needed . Continued to provide emotional support allowing patient to vent her frustrations in regards to caregiver strain experienced . Continued to recommend use of the EACP program for both herself and her spouse for ongoing mental health support  . Discussed concerns with patient's husband ability to make decisions for himself-options explored.    Patient Self Care Activities:  . Performs ADL's independently . Performs IADL's independently  Please see past updates related to this goal by clicking on the "Past Updates" button in the selected goal          Follow Up Plan: Client will contact the E[facility] for ongoing mental health therapy for herself and her spouse   SIGNATURE

## 2019-05-13 DIAGNOSIS — J3089 Other allergic rhinitis: Secondary | ICD-10-CM | POA: Diagnosis not present

## 2019-05-13 DIAGNOSIS — J301 Allergic rhinitis due to pollen: Secondary | ICD-10-CM | POA: Diagnosis not present

## 2019-05-13 DIAGNOSIS — J3081 Allergic rhinitis due to animal (cat) (dog) hair and dander: Secondary | ICD-10-CM | POA: Diagnosis not present

## 2019-05-20 DIAGNOSIS — J301 Allergic rhinitis due to pollen: Secondary | ICD-10-CM | POA: Diagnosis not present

## 2019-05-20 DIAGNOSIS — J3081 Allergic rhinitis due to animal (cat) (dog) hair and dander: Secondary | ICD-10-CM | POA: Diagnosis not present

## 2019-05-20 DIAGNOSIS — J3089 Other allergic rhinitis: Secondary | ICD-10-CM | POA: Diagnosis not present

## 2019-05-25 ENCOUNTER — Ambulatory Visit: Payer: Self-pay | Admitting: *Deleted

## 2019-05-25 ENCOUNTER — Telehealth: Payer: Self-pay | Admitting: *Deleted

## 2019-05-25 NOTE — Chronic Care Management (AMB) (Signed)
    Care Management   Unsuccessful Call Note 05/25/2019 Name: Tracey Huff MRN: [id number] DOB: May 23, 1970  Patient  is a 49 year old female who sees Raelyn Ensign, FNP for primary care. Raelyn Ensign, FNP asked the CCM team to consult the patient for Mental Health Counseling and Resources.    This social worker was unable to reach patient via telephone today for follow up call. I have left HIPAA compliant voicemail asking patient to return my call. (unsuccessful outreach #1).   Plan: Will follow-up within 7 business days via telephone.     Elliot Gurney, [facility] [phone]

## 2019-05-27 DIAGNOSIS — J3089 Other allergic rhinitis: Secondary | ICD-10-CM | POA: Diagnosis not present

## 2019-05-27 DIAGNOSIS — J3081 Allergic rhinitis due to animal (cat) (dog) hair and dander: Secondary | ICD-10-CM | POA: Diagnosis not present

## 2019-05-27 DIAGNOSIS — J301 Allergic rhinitis due to pollen: Secondary | ICD-10-CM | POA: Diagnosis not present

## 2019-06-01 ENCOUNTER — Telehealth: Payer: Self-pay | Admitting: *Deleted

## 2019-06-03 ENCOUNTER — Ambulatory Visit: Payer: Self-pay | Admitting: *Deleted

## 2019-06-03 DIAGNOSIS — J3089 Other allergic rhinitis: Secondary | ICD-10-CM | POA: Diagnosis not present

## 2019-06-03 DIAGNOSIS — J301 Allergic rhinitis due to pollen: Secondary | ICD-10-CM | POA: Diagnosis not present

## 2019-06-03 DIAGNOSIS — J3081 Allergic rhinitis due to animal (cat) (dog) hair and dander: Secondary | ICD-10-CM | POA: Diagnosis not present

## 2019-06-03 DIAGNOSIS — F430 Acute stress reaction: Secondary | ICD-10-CM

## 2019-06-03 DIAGNOSIS — F411 Generalized anxiety disorder: Secondary | ICD-10-CM

## 2019-06-03 NOTE — Chronic Care Management (AMB) (Signed)
   Care Management    Clinical Social Work Follow Up Note  06/03/2019 Name: Tracey Huff MRN: [id number] DOB: 1971/01/17  Tracey Huff is a 49 y.o. year old female who is a primary care patient of Tracey Hartshorn, FNP. The CCM team was consulted for assistance with Mental Health Counseling and Resources.   Review of patient status, including review of consultants reports, other relevant assessments, and collaboration with appropriate care team members and the patient's provider was performed as part of comprehensive patient evaluation and provision of chronic care management services.    Advanced Directives Status: <no information> See Care Plan for related entries.   Outpatient Encounter Medications as of 06/03/2019  Medication Sig  . albuterol (PROVENTIL HFA;VENTOLIN HFA) 108 (90 BASE) MCG/ACT inhaler Inhale 1-2 puffs into the lungs every 6 (six) hours as needed for wheezing or shortness of breath.  . budesonide-formoterol (SYMBICORT) 160-4.5 MCG/ACT inhaler Inhale into the lungs.  . busPIRone (BUSPAR) 7.5 MG tablet Take 1 tablet once daily at night; may take 1 additional tablet daily if needed for anxiety.  . Cholecalciferol 25 MCG (1000 UT) capsule Take by mouth.  . EPINEPHrine 0.15 MG/0.15ML IJ injection Inject into the skin.  Marland Kitchen levocetirizine (XYZAL) 5 MG tablet Take 5 mg by mouth every evening.  Marland Kitchen losartan (COZAAR) 25 MG tablet Take 0.5 tablets (12.5 mg total) by mouth daily.  . montelukast (SINGULAIR) 10 MG tablet Take by mouth.   No facility-administered encounter medications on file as of 06/03/2019.     Goals Addressed            This Visit's Progress   . "I am under alot of stress with caregiving for my husband" completed (pt-stated)       Current Barriers:  . Family relationship dysfunction . Limited access to caregiver support  Clinical Social Work Clinical Goal(s):  Marland Kitchen Over the next 90 days, patient will continue to follow up with this social worker to explore  strategies to manage stress related to caregiver strain  Interventions:  . Patient's relationship with spouse explored as well as status of his new treatments . Patient confirmed that she is doing much better emotionally, stress level has improved . Patient discussed an improvement in her relationship with her spouse as she is learning to express her feelings more . Patient discussed plans to start a [place] for ongoing support . Patient has decided against EACP at this time due to plan to start the support group . Continued to promote self care practices by utilizing the private duty aid for respite . Provided positive reinforcement for self care strategies used including spending quality time with family and utilizing in home aid when needed . Patient provided with this social worker's contact information and encouraged patient to call this social worker if needed in the future.    Patient Self Care Activities:  . Performs ADL's independently . Performs IADL's independently  Please see past updates related to this goal by clicking on the "Past Updates" button in the selected goal          Follow Up Plan: Client will contact this social worker if there are needs in the future   [place], [facility] [phone]

## 2019-06-03 NOTE — Patient Instructions (Addendum)
Thank you allowing the Chronic Care Management Team to be a part of your care! It was a pleasure speaking with you today!  1. Please call this social worker if there are in mental health or community resource needs in the future.  CCM (Chronic Care Management) Team   Neldon Labella  RN, BSN Nurse Care Coordinator  [phone]  Ruben Reason PharmD  Clinical Pharmacist  [phone]   Elliot Gurney, LCSW Clinical Social Worker [phone]  Goals Addressed            This Visit's Progress   . "I am under alot of stress with caregiving for my husband" completed (pt-stated)       Current Barriers:  . Family relationship dysfunction . Limited access to caregiver support  Clinical Social Work Clinical Goal(s):  Marland Kitchen Over the next 90 days, patient will continue to follow up with this social worker to explore strategies to manage stress related to caregiver strain  Interventions:  . Patient's relationship with spouse explored as well as status of his new treatments . Patient confirmed that she is doing much better emotionally, stress level has improved . Patient discussed an improvement in her relationship with her spouse as she is learning to express her feelings more . Patient discussed plans to start a [place] for ongoing support . Patient has decided against EACP at this time due to plan to start the support group . Continued to promote self care practices by utilizing the private duty aid for respite . Provided positive reinforcement for self care strategies used including spending quality time with family and utilizing in home aid when needed . Patient provided with this social worker's contact information and encouraged patient to call this social worker if needed in the future.    Patient Self Care Activities:  . Performs ADL's independently . Performs IADL's independently  Please see past updates related to this goal by clicking on the "Past  Updates" button in the selected goal          The patient verbalized understanding of instructions provided today and declined a print copy of patient instruction materials.   No further follow up required: patient to call this social worker if there are any questions or concerns regarding your mental health or community resource needs

## 2019-06-10 DIAGNOSIS — J3081 Allergic rhinitis due to animal (cat) (dog) hair and dander: Secondary | ICD-10-CM | POA: Diagnosis not present

## 2019-06-10 DIAGNOSIS — J301 Allergic rhinitis due to pollen: Secondary | ICD-10-CM | POA: Diagnosis not present

## 2019-06-10 DIAGNOSIS — J3089 Other allergic rhinitis: Secondary | ICD-10-CM | POA: Diagnosis not present

## 2019-06-17 DIAGNOSIS — J301 Allergic rhinitis due to pollen: Secondary | ICD-10-CM | POA: Diagnosis not present

## 2019-06-17 DIAGNOSIS — J3089 Other allergic rhinitis: Secondary | ICD-10-CM | POA: Diagnosis not present

## 2019-06-17 DIAGNOSIS — J3081 Allergic rhinitis due to animal (cat) (dog) hair and dander: Secondary | ICD-10-CM | POA: Diagnosis not present

## 2019-06-28 ENCOUNTER — Other Ambulatory Visit: Payer: Self-pay

## 2019-06-28 ENCOUNTER — Ambulatory Visit (INDEPENDENT_AMBULATORY_CARE_PROVIDER_SITE_OTHER): Payer: 59 | Admitting: Family Medicine

## 2019-06-28 ENCOUNTER — Encounter: Payer: Self-pay | Admitting: Family Medicine

## 2019-06-28 VITALS — BP 138/80 | HR 98 | Temp 98.6°F | Resp 14 | Ht 60.0 in | Wt 166.7 lb

## 2019-06-28 DIAGNOSIS — Z1231 Encounter for screening mammogram for malignant neoplasm of breast: Secondary | ICD-10-CM | POA: Diagnosis not present

## 2019-06-28 DIAGNOSIS — Z90710 Acquired absence of both cervix and uterus: Secondary | ICD-10-CM

## 2019-06-28 DIAGNOSIS — D069 Carcinoma in situ of cervix, unspecified: Secondary | ICD-10-CM

## 2019-06-28 DIAGNOSIS — R7303 Prediabetes: Secondary | ICD-10-CM | POA: Diagnosis not present

## 2019-06-28 DIAGNOSIS — Z0000 Encounter for general adult medical examination without abnormal findings: Secondary | ICD-10-CM | POA: Diagnosis not present

## 2019-06-28 DIAGNOSIS — Z6832 Body mass index (BMI) 32.0-32.9, adult: Secondary | ICD-10-CM | POA: Diagnosis not present

## 2019-06-28 DIAGNOSIS — E6609 Other obesity due to excess calories: Secondary | ICD-10-CM | POA: Diagnosis not present

## 2019-06-28 DIAGNOSIS — E785 Hyperlipidemia, unspecified: Secondary | ICD-10-CM | POA: Diagnosis not present

## 2019-06-28 DIAGNOSIS — I10 Essential (primary) hypertension: Secondary | ICD-10-CM

## 2019-06-28 NOTE — Progress Notes (Signed)
Patient: Tracey Huff, Female    DOB: 1970/06/23, 49 y.o.   MRN: [id number] Hubbard Hartshorn, FNP Visit Date: 06/28/2019  Today's Provider: Delsa Grana, PA-C   Chief Complaint  Patient presents with  . Annual Exam   Subjective:   Annual physical exam:  Tracey Huff is a 49 y.o. female who presents today for complete physical exam:  A lot going on - husband has brain tumor, she is going to [facility] and [facility] for cervical and breast CA  Exercise/Activity:   Some exercise at home trying apps  Diet/nutrition:  Cut out pork, cut back on sleeps Sleep:  Okay, some nights poor sleep with husband with seizures/falls  HTN well controlled on losartan 25 mg, no CP, HA, palpitations, LE edema DOE  Asthma well controlled with SABA symbicort singulair, and controlling seasonal and nasal allergies   Oncology here at [facility] for cervical CA- dx last year, next follow up 01/2020  USPSTF grade A and B recommendations - reviewed and addressed today  Depression:  Phq 9 completed today by patient, was reviewed by me with patient in the room, score is  negative, pt feels overall good PHQ 2/9 Scores 06/28/2019 12/30/2018 12/28/2018 06/23/2018  PHQ - 2 Score 0 0 0 0  PHQ- 9 Score 1 - 0 4   Depression screen [facility] 2/9 06/28/2019 12/30/2018 12/28/2018 06/23/2018 05/20/2018  Decreased Interest 0 0 0 0 1  Down, Depressed, Hopeless 0 0 0 0 1  PHQ - 2 Score 0 0 0 0 2  Altered sleeping 1 - 0 2 2  Tired, decreased energy 0 - 0 0 0  Change in appetite 0 - 0 1 2  Feeling bad or failure about yourself  0 - 0 0 0  Trouble concentrating 0 - 0 0 0  Moving slowly or fidgety/restless 0 - 0 1 1  Suicidal thoughts 0 - 0 0 0  PHQ-9 Score 1 - 0 4 7  Difficult doing work/chores Not difficult at all - Not difficult at all Somewhat difficult Somewhat difficult    Alcohol screening:   Office Visit from 06/28/2019 in [facility]  AUDIT-C Score  0      Immunizations and Health Maintenance: Health  Maintenance  Topic Date Due  . Samul Dada  09/13/1989  . MAMMOGRAM  09/17/2019  . PAP SMEAR-Modifier  06/23/2021  . INFLUENZA VACCINE  Completed  . HIV Screening  Completed     Hep C Screening: not applicable for DOB  STD testing and prevention (HIV/chl/gon/syphilis): done  Intimate partner violence:  Safe  Sexual History/Pain during Intercourse: not active right now, about over a year Married  Menstrual History/LMP/Abnormal Bleeding:   Per GYN  Patient's last menstrual period was 11/15/2014.  Incontinence Symptoms: none  concerning  Breast cancer:   Did some mammograms/surgery at [facility] wants to [facility] back to [facility] for monitoring Last Mammogram:  09/2018 BRCA gene screening:  none  Cervical cancer screening: UTD with GYN Family hx of cancers - breast, ovarian, uterine, colon:   Pt denies  Osteoporosis:   Discussed high calcium and vitamin D supplementation, weight bearing exercises Pt is  supplementing with daily calcium/Vit D. Bone scan/dexa none done yet, not indicated for age   Skin cancer:  Hx of skin CA -  NO Discussed atypical lesions   Colorectal cancer:   colonoscopy is due  Lung cancer:   Low Dose CT Chest recommended if Age 24-80 years, 30 pack-year currently smoking OR  have quit w/in 15years. Patient does not qualify.   Social History   Tobacco Use  . Smoking status: Never Smoker  . Smokeless tobacco: Never Used  Substance Use Topics  . Alcohol use: Not Currently    Comment: occ     ECG: none   Blood pressure/Hypertension: BP Readings from Last 3 Encounters:  06/28/19 138/80  05/05/19 (!) 155/101  12/30/18 135/88    Weight/Obesity: Wt Readings from Last 3 Encounters:  06/28/19 166 lb 11.2 oz (75.6 kg)  05/05/19 170 lb 6.4 oz (77.3 kg)  12/30/18 171 lb 12.8 oz (77.9 kg)   BMI Readings from Last 3 Encounters:  06/28/19 32.56 kg/m  05/05/19 33.28 kg/m  12/30/18 33.55 kg/m     Lipids:  Lab Results  Component Value Date   CHOL 243  (H) 12/28/2018   CHOL 225 (H) 05/21/2018   Lab Results  Component Value Date   HDL 57 12/28/2018   HDL 48 (L) 05/21/2018   Lab Results  Component Value Date   LDLCALC 163 (H) 12/28/2018   LDLCALC 161 (H) 05/21/2018   Lab Results  Component Value Date   TRIG 111 12/28/2018   TRIG 63 05/21/2018   Lab Results  Component Value Date   CHOLHDL 4.3 12/28/2018   CHOLHDL 4.7 05/21/2018   No results found for: LDLDIRECT Based on the results of lipid panel his/her cardiovascular risk factor ( using [facility] )  in the next 10 years is: The 10-year ASCVD risk score Mikey Bussing DC Brooke Bonito., et al., 2013) is: 10.7%   Values used to calculate the score:     Age: 9 years     Sex: Female     Is Non-Hispanic African American: Yes     Diabetic: Yes     Tobacco smoker: No     Systolic Blood Pressure: 161 mmHg     Is BP treated: Yes     HDL Cholesterol: 57 mg/dL     Total Cholesterol: 243 mg/dL  Glucose:  Glucose  Date Value Ref Range Status  08/24/2014 100 (H) mg/dL Final    Comment:    65-99 NOTE: New Reference Range  07/12/14   05/12/2011 98 65 - 99 mg/dL Final  05/11/2011 98 65 - 99 mg/dL Final   Glucose, Bld  Date Value Ref Range Status  12/28/2018 98 65 - 99 mg/dL Final    Comment:    .            Fasting reference interval .   05/21/2018 105 (H) 65 - 99 mg/dL Final    Comment:    .            Fasting reference interval . For someone without known diabetes, a glucose value between 100 and 125 mg/dL is consistent with prediabetes and should be confirmed with a follow-up test. .   11/17/2014 99 65 - 99 mg/dL Final      Office Visit from 06/28/2019 in [facility]  AUDIT-C Score  0     Depression: Phq 9 is  negative Depression screen [facility] 2/9 06/28/2019 12/30/2018 12/28/2018 06/23/2018 05/20/2018  Decreased Interest 0 0 0 0 1  Down, Depressed, Hopeless 0 0 0 0 1  PHQ - 2 Score 0 0 0 0 2  Altered sleeping 1 - 0 2 2  Tired, decreased energy 0 - 0 0 0    Change in appetite 0 - 0 1 2  Feeling bad or failure about yourself  0 -  0 0 0  Trouble concentrating 0 - 0 0 0  Moving slowly or fidgety/restless 0 - 0 1 1  Suicidal thoughts 0 - 0 0 0  PHQ-9 Score 1 - 0 4 7  Difficult doing work/chores Not difficult at all - Not difficult at all Somewhat difficult Somewhat difficult   Hypertension: BP Readings from Last 3 Encounters:  06/28/19 138/80  05/05/19 (!) 155/101  12/30/18 135/88   Obesity: Wt Readings from Last 3 Encounters:  06/28/19 166 lb 11.2 oz (75.6 kg)  05/05/19 170 lb 6.4 oz (77.3 kg)  12/30/18 171 lb 12.8 oz (77.9 kg)   BMI Readings from Last 3 Encounters:  06/28/19 32.56 kg/m  05/05/19 33.28 kg/m  12/30/18 33.55 kg/m     Advanced Care Planning:  A voluntary discussion about advance care planning including the explanation and discussion of advance directives.   Discussed health care proxy and Living will, and the patient was able to identify a health care proxy as .   Patient does not have a living will at present time.   Social History      She  reports that she has never smoked. She has never used smokeless tobacco. She reports previous alcohol use. She reports that she does not use drugs.       Social History   Socioeconomic History  . Marital status: Married    Spouse name: Fritz Pickerel  . Number of children: 2  . Years of education: Not on file  . Highest education level: Not on file  Occupational History  . Not on file  Tobacco Use  . Smoking status: Never Smoker  . Smokeless tobacco: Never Used  Substance and Sexual Activity  . Alcohol use: Not Currently    Comment: occ  . Drug use: No  . Sexual activity: Yes    Partners: Male    Birth control/protection: Surgical  Other Topics Concern  . Not on file  Social History Narrative  . Not on file   Social Determinants of Health   Financial Resource Strain:   . Difficulty of Paying Living Expenses: Not on file  Food Insecurity:   . Worried About  Charity fundraiser in the Last Year: Not on file  . Ran Out of Food in the Last Year: Not on file  Transportation Needs:   . Lack of Transportation (Medical): Not on file  . Lack of Transportation (Non-Medical): Not on file  Physical Activity: Insufficiently Active  . Days of Exercise per Week: 3 days  . Minutes of Exercise per Session: 30 min  Stress: Stress Concern Present  . Feeling of Stress : Very much  Social Connections:   . Frequency of Communication with Friends and Family: Not on file  . Frequency of Social Gatherings with Friends and Family: Not on file  . Attends Religious Services: Not on file  . Active Member of Clubs or Organizations: Not on file  . Attends Archivist Meetings: Not on file  . Marital Status: Not on file    Family History        Family Status  Relation Name Status  . Mother  Deceased  . Father  Deceased  . MGM  (Not Specified)  . MGF  (Not Specified)        Her family history includes Congestive Heart Failure in her maternal grandfather and mother; Diabetes in her maternal grandmother and mother; Hypertension in her mother.       Family History  Problem Relation Age of Onset  . Congestive Heart Failure Mother   . Diabetes Mother   . Hypertension Mother   . Diabetes Maternal Grandmother   . Congestive Heart Failure Maternal Grandfather     Patient Active Problem List   Diagnosis Date Noted  . CIN III (cervical intraepithelial neoplasia grade III) with severe dysplasia 08/11/2018  . Atypical squamous cell changes of undetermined significance (ASCUS) on cervical cytology with positive high risk human papilloma virus (HPV) 06/29/2018  . Prediabetes 06/24/2018  . Class 1 obesity due to excess calories with serious comorbidity and body mass index (BMI) of 33.0 to 33.9 in adult 05/20/2018  . Essential hypertension 05/20/2018  . Hyperlipidemia 05/20/2018  . Asthma 05/20/2018  . Anxiety in acute stress reaction 05/20/2018  . Status  post hysterectomy 11/15/2014    Past Surgical History:  Procedure Laterality Date  . ABDOMINAL HYSTERECTOMY N/A 11/15/2014   Procedure: HYSTERECTOMY ABDOMINAL/BILATERAL SALPINGECTOMY ;  Surgeon: Will Bonnet, MD;  Location: [facility] ORS;  Service: Gynecology;  Laterality: N/A;  . CESAREAN SECTION    . CYSTOSCOPY N/A 11/15/2014   Procedure: CYSTOSCOPY;  Surgeon: Will Bonnet, MD;  Location: [facility] ORS;  Service: Gynecology;  Laterality: N/A;  . DILATION AND CURETTAGE OF UTERUS    . GANGLION CYST EXCISION Left   . UTERINE FIBROID SURGERY       Current Outpatient Medications:  .  albuterol (PROVENTIL HFA;VENTOLIN HFA) 108 (90 BASE) MCG/ACT inhaler, Inhale 1-2 puffs into the lungs every 6 (six) hours as needed for wheezing or shortness of breath., Disp: , Rfl:  .  Ascorbic Acid (VITAMIN C) 100 MG tablet, Take 100 mg by mouth daily., Disp: , Rfl:  .  budesonide-formoterol (SYMBICORT) 160-4.5 MCG/ACT inhaler, Inhale into the lungs., Disp: , Rfl:  .  busPIRone (BUSPAR) 7.5 MG tablet, Take 1 tablet once daily at night; may take 1 additional tablet daily if needed for anxiety., Disp: 180 tablet, Rfl: 1 .  Cholecalciferol 25 MCG (1000 UT) capsule, Take by mouth., Disp: , Rfl:  .  ELDERBERRY PO, Take by mouth., Disp: , Rfl:  .  EPINEPHrine 0.15 MG/0.15ML IJ injection, Inject into the skin., Disp: , Rfl:  .  levocetirizine (XYZAL) 5 MG tablet, Take 5 mg by mouth every evening., Disp: , Rfl:  .  losartan (COZAAR) 25 MG tablet, Take 0.5 tablets (12.5 mg total) by mouth daily., Disp: 90 tablet, Rfl: 1 .  montelukast (SINGULAIR) 10 MG tablet, Take by mouth., Disp: , Rfl:  .  Multiple Vitamin (MULTI-VITAMIN DAILY PO), Take by mouth., Disp: , Rfl:   Allergies  Allergen Reactions  . Macadamia Nut Oil Shortness Of Breath  . Apple Nausea And Vomiting  . Fruit & Vegetable Daily [Nutritional Supplements] Nausea And Vomiting    Cannot tolerate apples, peaches, plums, and nectarines  . Kiwi Extract  Nausea And Vomiting  . Peach [Prunus Persica] Nausea And Vomiting    Patient Care Team: Hubbard Hartshorn, FNP as PCP - General (Family Medicine) Clent Jacks, RN as Oncology Nurse Navigator [place], [place], [place] as Social Worker  Review of Systems  Constitutional: Negative.  Negative for activity change, appetite change, fatigue and unexpected weight change.  HENT: Negative.   Eyes: Negative.   Respiratory: Negative.  Negative for shortness of breath.   Cardiovascular: Negative.  Negative for chest pain, palpitations and leg swelling.  Gastrointestinal: Negative.  Negative for abdominal pain and blood in stool.  Endocrine: Negative.   Genitourinary: Negative.  Musculoskeletal: Negative.  Negative for arthralgias, gait problem, joint swelling and myalgias.  Skin: Negative.  Negative for color change, pallor and rash.  Allergic/Immunologic: Negative.   Neurological: Negative.  Negative for syncope and weakness.  Hematological: Negative.   Psychiatric/Behavioral: Negative.  Negative for confusion, dysphoric mood, self-injury and suicidal ideas. The patient is not nervous/anxious.             Objective:   Vitals:  Vitals:   06/28/19 0815  BP: 138/80  Pulse: 98  Resp: 14  Temp: 98.6 F (37 C)  SpO2: 98%  Weight: 166 lb 11.2 oz (75.6 kg)  Height: 5' (1.524 m)    Body mass index is 32.56 kg/m.  Physical Exam Vitals and nursing note reviewed.  Constitutional:      General: She is not in acute distress.    Appearance: Normal appearance. She is well-developed. She is obese. She is not toxic-appearing or diaphoretic.  HENT:     Head: Normocephalic and atraumatic.     Right Ear: External ear normal.     Left Ear: External ear normal.     Nose: Nose normal.     Mouth/Throat:     Pharynx: Uvula midline.  Eyes:     General: Lids are normal.     Conjunctiva/sclera: Conjunctivae normal.     Pupils: Pupils are equal, round, and reactive to light.  Neck:     Trachea:  Phonation normal. No tracheal deviation.  Cardiovascular:     Rate and Rhythm: Normal rate and regular rhythm.     Pulses: Normal pulses.          Radial pulses are 2+ on the right side and 2+ on the left side.       Posterior tibial pulses are 2+ on the right side and 2+ on the left side.     Heart sounds: Normal heart sounds. No murmur. No friction rub. No gallop.   Pulmonary:     Effort: Pulmonary effort is normal. No respiratory distress.     Breath sounds: Normal breath sounds. No stridor. No wheezing, rhonchi or rales.  Chest:     Chest wall: No tenderness.     Breasts:        Right: Normal. No swelling, bleeding, inverted nipple, mass, nipple discharge, skin change or tenderness.        Left: Normal. No swelling, bleeding, inverted nipple, mass, nipple discharge, skin change or tenderness.  Abdominal:     General: Bowel sounds are normal. There is no distension.     Palpations: Abdomen is soft.     Tenderness: There is no abdominal tenderness. There is no guarding or rebound.  Genitourinary:    Comments: GU not done - per GYN Musculoskeletal:        General: No deformity. Normal range of motion.     Cervical back: Normal range of motion and neck supple.     Right lower leg: No edema.     Left lower leg: No edema.  Lymphadenopathy:     Cervical: No cervical adenopathy.     Upper Body:     Right upper body: No axillary or pectoral adenopathy.     Left upper body: No axillary or pectoral adenopathy.  Skin:    General: Skin is warm and dry.     Capillary Refill: Capillary refill takes less than 2 seconds.     Coloration: Skin is not pale.     Findings: No rash.  Neurological:  Mental Status: She is alert and oriented to person, place, and time.     Motor: No abnormal muscle tone.     Gait: Gait normal.  Psychiatric:        Mood and Affect: Mood normal.        Speech: Speech normal.        Behavior: Behavior normal.      Fall Risk: Fall Risk  06/28/2019 12/28/2018  06/23/2018 05/20/2018  Falls in the past year? 0 0 0 0  Number falls in past yr: 0 0 0 0  Injury with Fall? 0 0 0 0  Follow up - Falls evaluation completed Falls evaluation completed Falls evaluation completed    Functional Status Survey: Is the patient deaf or have difficulty hearing?: No Does the patient have difficulty seeing, even when wearing glasses/contacts?: No Does the patient have difficulty concentrating, remembering, or making decisions?: No Does the patient have difficulty walking or climbing stairs?: No Does the patient have difficulty dressing or bathing?: No Does the patient have difficulty doing errands alone such as visiting a doctor's office or shopping?: No   Assessment & Plan:    CPE completed today  . USPSTF grade A and B recommendations reviewed with patient; age-appropriate recommendations, preventive care, screening tests, etc discussed and encouraged; healthy living encouraged; see AVS for patient education given to patient  . Discussed importance of 150 minutes of physical activity weekly, AHA exercise recommendations given to pt in AVS/handout  . Discussed importance of healthy diet:  eating lean meats and proteins, avoiding trans fats and saturated fats, avoid simple sugars and excessive carbs in diet, eat 6 servings of fruit/vegetables daily and drink plenty of water and avoid sweet beverages.    . Recommended pt to do annual eye exam and routine dental exams/cleanings  . Depression, alcohol, fall screening completed as documented above and per flowsheets  . Reviewed Health Maintenance: Health Maintenance  Topic Date Due  . TETANUS/TDAP  09/13/1989  . MAMMOGRAM  09/17/2019  . PAP SMEAR-Modifier  06/23/2021  . INFLUENZA VACCINE  Completed  . HIV Screening  Completed    . Immunizations: Immunization History  Administered Date(s) Administered  . Influenza,inj,Quad PF,6+ Mos 02/05/2019  . Pneumococcal Polysaccharide-23 05/20/2018      ICD-10-CM     1. Adult general medical exam  Z00.00 CBC with Differential/Platelet    COMPLETE METABOLIC PANEL WITH GFR    Lipid panel    Hemoglobin A1c  2. Essential hypertension  I10    well controlled, stable  3. Hyperlipidemia, unspecified hyperlipidemia type  E78.5    recheck lipids, hx of high cholesterol, not currently on meds, working on lifestyle and diet  4. Prediabetes  R73.03    recheck A1C  5. CIN III (cervical intraepithelial neoplasia grade III) with severe dysplasia  D06.9    per GYN  6. Status post hysterectomy  Z90.710   7. Class 1 obesity due to excess calories with serious comorbidity and body mass index (BMI) of 32.0 to 32.9 in adult  E66.09    Z68.32    encouraged healthy habits with diet and exercise  8. Encounter for screening mammogram for malignant neoplasm of breast  Z12.31 MM 3D SCREEN BREAST BILATERAL         Delsa Grana, PA-C 06/28/19 8:38 AM  [facility]

## 2019-06-28 NOTE — Patient Instructions (Addendum)
Preventive Care 19-49 Years Old, Female Preventive care refers to visits with your health care provider and lifestyle choices that can promote health and wellness. This includes:  A yearly physical exam. This may also be called an annual well check.  Regular dental visits and eye exams.  Immunizations.  Screening for certain conditions.  Healthy lifestyle choices, such as eating a healthy diet, getting regular exercise, not using drugs or products that contain nicotine and tobacco, and limiting alcohol use. What can I expect for my preventive care visit? Physical exam Your health care provider will check your:  Height and weight. This may be used to calculate body mass index (BMI), which tells if you are at a healthy weight.  Heart rate and blood pressure.  Skin for abnormal spots. Counseling Your health care provider may ask you questions about your:  Alcohol, tobacco, and drug use.  Emotional well-being.  Home and relationship well-being.  Sexual activity.  Eating habits.  Work and work Statistician.  Method of birth control.  Menstrual cycle.  Pregnancy history. What immunizations do I need?  Influenza (flu) vaccine  This is recommended every year. Tetanus, diphtheria, and pertussis (Tdap) vaccine  You may need a Td booster every 10 years. Varicella (chickenpox) vaccine  You may need this if you have not been vaccinated. Zoster (shingles) vaccine  You may need this after age 75. Measles, mumps, and rubella (MMR) vaccine  You may need at least one dose of MMR if you were born in 1957 or later. You may also need a second dose. Pneumococcal conjugate (PCV13) vaccine  You may need this if you have certain conditions and were not previously vaccinated. Pneumococcal polysaccharide (PPSV23) vaccine  You may need one or two doses if you smoke cigarettes or if you have certain conditions. Meningococcal conjugate (MenACWY) vaccine  You may need this if  you have certain conditions. Hepatitis A vaccine  You may need this if you have certain conditions or if you travel or work in places where you may be exposed to hepatitis A. Hepatitis B vaccine  You may need this if you have certain conditions or if you travel or work in places where you may be exposed to hepatitis B. Haemophilus influenzae type b (Hib) vaccine  You may need this if you have certain conditions. Human papillomavirus (HPV) vaccine  If recommended by your health care provider, you may need three doses over 6 months. You may receive vaccines as individual doses or as more than one vaccine together in one shot (combination vaccines). Talk with your health care provider about the risks and benefits of combination vaccines. What tests do I need? Blood tests  Lipid and cholesterol levels. These may be checked every 5 years, or more frequently if you are over 30 years old.  Hepatitis C test.  Hepatitis B test. Screening  Lung cancer screening. You may have this screening every year starting at age 44 if you have a 30-pack-year history of smoking and currently smoke or have quit within the past 15 years.  Colorectal cancer screening. All adults should have this screening starting at age 52 and continuing until age 36. Your health care provider may recommend screening at age 70 if you are at increased risk. You will have tests every 1-10 years, depending on your results and the type of screening test.  Diabetes screening. This is done by checking your blood sugar (glucose) after you have not eaten for a while (fasting). You may  have this done every 1-3 years.  Mammogram. This may be done every 1-2 years. Talk with your health care provider about when you should start having regular mammograms. This may depend on whether you have a family history of breast cancer.  BRCA-related cancer screening. This may be done if you have a family history of breast, ovarian, tubal, or  peritoneal cancers.  Pelvic exam and Pap test. This may be done every 3 years starting at age 26. Starting at age 47, this may be done every 5 years if you have a Pap test in combination with an HPV test. Other tests  Sexually transmitted disease (STD) testing.  Bone density scan. This is done to screen for osteoporosis. You may have this scan if you are at high risk for osteoporosis. Follow these instructions at home: Eating and drinking  Eat a diet that includes fresh fruits and vegetables, whole grains, lean protein, and low-fat dairy.  Take vitamin and mineral supplements as recommended by your health care provider.  Do not drink alcohol if: ? Your health care provider tells you not to drink. ? You are pregnant, may be pregnant, or are planning to become pregnant.  If you drink alcohol: ? Limit how much you have to 0-1 drink a day. ? Be aware of how much alcohol is in your drink. In the [place] one drink equals one 12 oz bottle of beer (355 mL), one 5 oz glass of wine (148 mL), or one 1 oz glass of hard liquor (44 mL). Lifestyle  Take daily care of your teeth and gums.  Stay active. Exercise for at least 30 minutes on 5 or more days each week.  Do not use any products that contain nicotine or tobacco, such as cigarettes, e-cigarettes, and chewing tobacco. If you need help quitting, ask your health care provider.  If you are sexually active, practice safe sex. Use a condom or other form of birth control (contraception) in order to prevent pregnancy and STIs (sexually transmitted infections).  If told by your health care provider, take low-dose aspirin daily starting at age 39. What's next?  Visit your health care provider once a year for a well check visit.  Ask your health care provider how often you should have your eyes and teeth checked.  Stay up to date on all vaccines. This information is not intended to replace advice given to you by your health care provider. Make  sure you discuss any questions you have with your health care provider. Document Revised: 01/01/2018 Document Reviewed: 01/01/2018 Elsevier Patient Education  2020 [employer].

## 2019-06-29 ENCOUNTER — Ambulatory Visit (INDEPENDENT_AMBULATORY_CARE_PROVIDER_SITE_OTHER): Payer: 59 | Admitting: Family Medicine

## 2019-06-29 ENCOUNTER — Encounter: Payer: Self-pay | Admitting: Family Medicine

## 2019-06-29 VITALS — Ht 60.0 in | Wt 166.0 lb

## 2019-06-29 DIAGNOSIS — M5441 Lumbago with sciatica, right side: Secondary | ICD-10-CM

## 2019-06-29 LAB — CBC WITH DIFFERENTIAL/PLATELET
Absolute Monocytes: 653 cells/uL (ref 200–950)
Basophils Absolute: 46 cells/uL (ref 0–200)
Basophils Relative: 0.5 %
Eosinophils Absolute: 368 cells/uL (ref 15–500)
Eosinophils Relative: 4 %
HCT: 38.1 % (ref 35.0–45.0)
Hemoglobin: 12.4 g/dL (ref 11.7–15.5)
Lymphs Abs: 2751 cells/uL (ref 850–3900)
MCH: 27.4 pg (ref 27.0–33.0)
MCHC: 32.5 g/dL (ref 32.0–36.0)
MCV: 84.3 fL (ref 80.0–100.0)
MPV: 9.7 fL (ref 7.5–12.5)
Monocytes Relative: 7.1 %
Neutro Abs: 5382 cells/uL (ref 1500–7800)
Neutrophils Relative %: 58.5 %
Platelets: 370 10*3/uL (ref 140–400)
RBC: 4.52 10*6/uL (ref 3.80–5.10)
RDW: 13.6 % (ref 11.0–15.0)
Total Lymphocyte: 29.9 %
WBC: 9.2 10*3/uL (ref 3.8–10.8)

## 2019-06-29 LAB — HEMOGLOBIN A1C
Hgb A1c MFr Bld: 6.4 % of total Hgb — ABNORMAL HIGH (ref <5.7)
Mean Plasma Glucose: 137 (calc)
eAG (mmol/L): 7.6 (calc)

## 2019-06-29 LAB — COMPLETE METABOLIC PANEL WITH GFR
AG Ratio: 1.4 (calc) (ref 1.0–2.5)
ALT: 20 U/L (ref 6–29)
AST: 17 U/L (ref 10–35)
Albumin: 4.3 g/dL (ref 3.6–5.1)
Alkaline phosphatase (APISO): 44 U/L (ref 31–125)
BUN: 12 mg/dL (ref 7–25)
CO2: 27 mmol/L (ref 20–32)
Calcium: 9.4 mg/dL (ref 8.6–10.2)
Chloride: 103 mmol/L (ref 98–110)
Creat: 0.82 mg/dL (ref 0.50–1.10)
GFR, Est African American: 98 mL/min/{1.73_m2} (ref >=60)
GFR, Est Non African American: 85 mL/min/{1.73_m2} (ref >=60)
Globulin: 3 g/dL (calc) (ref 1.9–3.7)
Glucose, Bld: 100 mg/dL — ABNORMAL HIGH (ref 65–99)
Potassium: 3.8 mmol/L (ref 3.5–5.3)
Sodium: 138 mmol/L (ref 135–146)
Total Bilirubin: 0.4 mg/dL (ref 0.2–1.2)
Total Protein: 7.3 g/dL (ref 6.1–8.1)

## 2019-06-29 LAB — LIPID PANEL
Cholesterol: 261 mg/dL — ABNORMAL HIGH (ref <200)
HDL: 57 mg/dL (ref >=50)
LDL Cholesterol (Calc): 182 mg/dL (calc) — ABNORMAL HIGH
Non-HDL Cholesterol (Calc): 204 mg/dL (calc) — ABNORMAL HIGH (ref <130)
Total CHOL/HDL Ratio: 4.6 (calc) (ref <5.0)
Triglycerides: 97 mg/dL (ref <150)

## 2019-06-29 MED ORDER — TIZANIDINE HCL 4 MG PO TABS: 2.0000 mg | ORAL_TABLET | Freq: Three times a day (TID) | ORAL | 0 refills | Status: DC | PRN

## 2019-06-29 MED ORDER — PREDNISONE 20 MG PO TABS: 40.0000 mg | ORAL_TABLET | Freq: Every day | ORAL | 0 refills | Status: AC

## 2019-06-29 MED ORDER — PREDNISONE 20 MG PO TABS: 40.0000 mg | ORAL_TABLET | Freq: Every day | ORAL | 0 refills | Status: DC

## 2019-06-29 MED FILL — predniSONE 20 MG TABS: 20 | 5 days supply | Qty: 10 | Fill #0

## 2019-06-29 MED FILL — tiZANidine HCL 4 MG TABS: 4 | 7 days supply | Qty: 30 | Fill #0

## 2019-06-29 NOTE — Progress Notes (Signed)
Name: Tracey Huff   MRN: [id number]    DOB: August 31, 1970   Date:06/29/2019       Progress Note  Subjective:    Chief Complaint  Chief Complaint  Patient presents with  . Sciatic pain    I connected with  Tracey Huff  on 06/29/19 at  3:00 PM EST by a video enabled telemedicine application and verified that I am speaking with the correct person using two identifiers.  I discussed the limitations of evaluation and management by telemedicine and the availability of in person appointments. The patient expressed understanding and agreed to proceed. Staff also discussed with the patient that there may be a patient responsible charge related to this service. Patient Location: home Provider Location: [facility] Additional Individuals present: none  HPI Pt presents with intermittent right low back to buttock pain that radiates down her right leg.  Onset few months ago, keeps flaring up, usually after trying to care for her has been doing physical activity at home and sometimes at work after sitting for longer periods of time and will be irritated and shoot down her leg be stiff as she starting to stand up and get walking she also has increased pain usually at night and sometimes pursing in the morning.  Pain at times is severe shooting stabbing burning from right low back and SI joint radiating to the right thigh.  Aggravated by physical activity or immobility, no alleviating factors.  It usually comes and goes but she was here yesterday for physical and after sitting in the exam room for a while and then going to work for the last 2 days it seems to have flared up slightly worse than her normal.  She has not sought evaluation for this before, she denies any abdominal pain, urinary symptoms, saddle anesthesia, incontinence of stool or urine, fever chills sweats nausea vomiting.  She has no weakness or numbness in her lower extremities  Patient Active Problem List   Diagnosis Date Noted  . CIN  III (cervical intraepithelial neoplasia grade III) with severe dysplasia 08/11/2018  . Atypical squamous cell changes of undetermined significance (ASCUS) on cervical cytology with positive high risk human papilloma virus (HPV) 06/29/2018  . Prediabetes 06/24/2018  . Class 1 obesity due to excess calories with serious comorbidity and body mass index (BMI) of 33.0 to 33.9 in adult 05/20/2018  . Essential hypertension 05/20/2018  . Hyperlipidemia 05/20/2018  . Asthma 05/20/2018  . Anxiety in acute stress reaction 05/20/2018  . Status post hysterectomy 11/15/2014    Social History   Tobacco Use  . Smoking status: Never Smoker  . Smokeless tobacco: Never Used  Substance Use Topics  . Alcohol use: Not Currently    Comment: occ     Current Outpatient Medications:  .  albuterol (PROVENTIL HFA;VENTOLIN HFA) 108 (90 BASE) MCG/ACT inhaler, Inhale 1-2 puffs into the lungs every 6 (six) hours as needed for wheezing or shortness of breath., Disp: , Rfl:  .  Ascorbic Acid (VITAMIN C) 100 MG tablet, Take 100 mg by mouth daily., Disp: , Rfl:  .  budesonide-formoterol (SYMBICORT) 160-4.5 MCG/ACT inhaler, Inhale into the lungs., Disp: , Rfl:  .  busPIRone (BUSPAR) 7.5 MG tablet, Take 1 tablet once daily at night; may take 1 additional tablet daily if needed for anxiety., Disp: 180 tablet, Rfl: 1 .  Cholecalciferol 25 MCG (1000 UT) capsule, Take by mouth., Disp: , Rfl:  .  ELDERBERRY PO, Take by mouth., Disp: , Rfl:  .  EPINEPHrine 0.15 MG/0.15ML IJ injection, Inject into the skin., Disp: , Rfl:  .  levocetirizine (XYZAL) 5 MG tablet, Take 5 mg by mouth every evening., Disp: , Rfl:  .  losartan (COZAAR) 25 MG tablet, Take 0.5 tablets (12.5 mg total) by mouth daily., Disp: 90 tablet, Rfl: 1 .  montelukast (SINGULAIR) 10 MG tablet, Take by mouth., Disp: , Rfl:  .  Multiple Vitamin (MULTI-VITAMIN DAILY PO), Take by mouth., Disp: , Rfl:   Allergies  Allergen Reactions  . Macadamia Nut Oil Shortness Of  Breath  . Apple Nausea And Vomiting  . Fruit & Vegetable Daily [Nutritional Supplements] Nausea And Vomiting    Cannot tolerate apples, peaches, plums, and nectarines  . Kiwi Extract Nausea And Vomiting  . Peach [Prunus Persica] Nausea And Vomiting     Review of Systems  10 Systems reviewed and are negative for acute change except as noted in the HPI.   Objective:   Virtual encounter, vitals limited, only able to obtain the following Today's Vitals   06/29/19 1414  Weight: 166 lb (75.3 kg)  Height: 5' (1.524 m)   Body mass index is 32.42 kg/m. Nursing Note and Vital Signs reviewed.  Physical Exam Patient well-appearing, alert, no acute distress, walking without any apparent difficulty, she did try to show me with the video call on her cell phone area of pain she points to the right low back from lumbar to sacral spine and SI joint area PE limited by telephone encounter  Reviewed her labs from yesterdays CPE   Assessment and Plan:     ICD-10-CM   1. Right-sided low back pain with right-sided sciatica, unspecified chronicity  M54.41 predniSONE (DELTASONE) 20 MG tablet    tiZANidine (ZANAFLEX) 4 MG tablet   Patient with acute on chronic right low back pain with radicular symptoms per her history given today, no red flags  We will treat with a burst of steroids, muscle relaxers, NSAIDs, Tylenol, heat therapy, frequent position changes gentle stretching and avoidance of bending over or heavy lifting.  Encouraged her to get a lumbar spine brace if she has trouble with bending forward or lifting at work or at home.  Encouraged her to change positions frequently, get some ThermaCare patches or other over-the-counter patches to help with pain while she is at work.  Encouraged her to follow-up if no improvement in 1 to 2 weeks and suggested physical therapy for further evaluation and treatment.      -Red flags and when to present for emergency care or RTC including fever >101.8F,  chest pain, shortness of breath, new/worsening/un-resolving symptoms, reviewed with patient at time of visit. Follow up and care instructions discussed and provided in AVS. - I discussed the assessment and treatment plan with the patient. The patient was provided an opportunity to ask questions and all were answered. The patient agreed with the plan and demonstrated an understanding of the instructions.  I provided 14 minutes of non-face-to-face time during this encounter.  Delsa Grana, PA-C 06/29/19 4:44 PM

## 2019-07-08 DIAGNOSIS — J3081 Allergic rhinitis due to animal (cat) (dog) hair and dander: Secondary | ICD-10-CM | POA: Diagnosis not present

## 2019-07-08 DIAGNOSIS — J3089 Other allergic rhinitis: Secondary | ICD-10-CM | POA: Diagnosis not present

## 2019-07-08 DIAGNOSIS — J301 Allergic rhinitis due to pollen: Secondary | ICD-10-CM | POA: Diagnosis not present

## 2019-07-15 DIAGNOSIS — J3089 Other allergic rhinitis: Secondary | ICD-10-CM | POA: Diagnosis not present

## 2019-07-15 DIAGNOSIS — J3081 Allergic rhinitis due to animal (cat) (dog) hair and dander: Secondary | ICD-10-CM | POA: Diagnosis not present

## 2019-07-15 DIAGNOSIS — J301 Allergic rhinitis due to pollen: Secondary | ICD-10-CM | POA: Diagnosis not present

## 2019-07-22 DIAGNOSIS — J3081 Allergic rhinitis due to animal (cat) (dog) hair and dander: Secondary | ICD-10-CM | POA: Diagnosis not present

## 2019-07-22 DIAGNOSIS — J301 Allergic rhinitis due to pollen: Secondary | ICD-10-CM | POA: Diagnosis not present

## 2019-07-22 DIAGNOSIS — J3089 Other allergic rhinitis: Secondary | ICD-10-CM | POA: Diagnosis not present

## 2019-07-27 ENCOUNTER — Encounter: Payer: Self-pay | Admitting: Family Medicine

## 2019-07-27 ENCOUNTER — Telehealth (INDEPENDENT_AMBULATORY_CARE_PROVIDER_SITE_OTHER): Payer: 59 | Admitting: Family Medicine

## 2019-07-27 ENCOUNTER — Other Ambulatory Visit: Payer: Self-pay

## 2019-07-27 VITALS — Ht 60.0 in | Wt 166.0 lb

## 2019-07-27 DIAGNOSIS — R21 Rash and other nonspecific skin eruption: Secondary | ICD-10-CM | POA: Diagnosis not present

## 2019-07-27 MED ORDER — HYDROXYZINE HCL 10 MG PO TABS: 10.0000 mg | ORAL_TABLET | Freq: Three times a day (TID) | ORAL | 0 refills | Status: DC | PRN

## 2019-07-27 MED FILL — hydrOXYzine HCL 10 MG TABS: 10 | 10 days supply | Qty: 60 | Fill #0

## 2019-07-27 NOTE — Progress Notes (Signed)
Name: Tracey Huff   MRN: [id number]    DOB: 1971/04/19   Date:07/27/2019       Progress Note  Subjective:    Chief Complaint  Chief Complaint  Patient presents with  . Tick Removal    noticed on friday, blister, and now itchy with redness    I connected with  [name]  on 07/27/19 at 11:20 AM EDT by a video enabled telemedicine application and verified that I am speaking with the correct person using two identifiers.  I discussed the limitations of evaluation and management by telemedicine and the availability of in person appointments. The patient expressed understanding and agreed to proceed. Staff also discussed with the patient that there may be a patient responsible charge related to this service. Patient Location: work break room  Provider Location: [facility] Additional Individuals present: none   HPI Pt has concern for possible bug bite which she is concerned may have been the cause of red itchy skin onset Friday and Saturday 3-4 days ago, located to left ankle lateral side - she had some itching redness and swelling to 2-3 cm area, itching was severe, she noticed 2 small clear fluid filled blisters, they popped and there is small area of itching.  There has been only minimal improvement with treating with hydrocortisone and benadryl  For allergies she is taking singulair and xyzal she is having difficulty show me the area on her ankle today with a virtual encounter while she is at work in the break room but she did send pictures through my chart.  She does report that her vital signs she is able to take in between speaking with the nurse and speaking with me today- 87 HR 98%   Patient Active Problem List   Diagnosis Date Noted  . CIN III (cervical intraepithelial neoplasia grade III) with severe dysplasia 08/11/2018  . Atypical squamous cell changes of undetermined significance (ASCUS) on cervical cytology with positive high risk human papilloma virus (HPV)  06/29/2018  . Prediabetes 06/24/2018  . Class 1 obesity due to excess calories with serious comorbidity and body mass index (BMI) of 33.0 to 33.9 in adult 05/20/2018  . Essential hypertension 05/20/2018  . Hyperlipidemia 05/20/2018  . Asthma 05/20/2018  . Anxiety in acute stress reaction 05/20/2018  . Status post hysterectomy 11/15/2014    Social History   Tobacco Use  . Smoking status: Never Smoker  . Smokeless tobacco: Never Used  Substance Use Topics  . Alcohol use: Not Currently    Comment: occ     Current Outpatient Medications:  .  albuterol (PROVENTIL HFA;VENTOLIN HFA) 108 (90 BASE) MCG/ACT inhaler, Inhale 1-2 puffs into the lungs every 6 (six) hours as needed for wheezing or shortness of breath., Disp: , Rfl:  .  Ascorbic Acid (VITAMIN C) 100 MG tablet, Take 100 mg by mouth daily., Disp: , Rfl:  .  budesonide-formoterol (SYMBICORT) 160-4.5 MCG/ACT inhaler, Inhale into the lungs., Disp: , Rfl:  .  busPIRone (BUSPAR) 7.5 MG tablet, Take 1 tablet once daily at night; may take 1 additional tablet daily if needed for anxiety., Disp: 180 tablet, Rfl: 1 .  Cholecalciferol 25 MCG (1000 UT) capsule, Take by mouth., Disp: , Rfl:  .  ELDERBERRY PO, Take by mouth., Disp: , Rfl:  .  EPINEPHrine 0.15 MG/0.15ML IJ injection, Inject into the skin., Disp: , Rfl:  .  levocetirizine (XYZAL) 5 MG tablet, Take 5 mg by mouth every evening., Disp: , Rfl:  .  losartan (COZAAR) 25 MG tablet, Take 0.5 tablets (12.5 mg total) by mouth daily., Disp: 90 tablet, Rfl: 1 .  montelukast (SINGULAIR) 10 MG tablet, Take by mouth., Disp: , Rfl:  .  Multiple Vitamin (MULTI-VITAMIN DAILY PO), Take by mouth., Disp: , Rfl:  .  tiZANidine (ZANAFLEX) 4 MG tablet, Take 0.5-1.5 tablets (2-6 mg total) by mouth every 8 (eight) hours as needed for muscle spasms., Disp: 30 tablet, Rfl: 0  Allergies  Allergen Reactions  . Macadamia Nut Oil Shortness Of Breath  . Apple Nausea And Vomiting  . Fruit & Vegetable Daily  [Nutritional Supplements] Nausea And Vomiting    Cannot tolerate apples, peaches, plums, and nectarines  . Kiwi Extract Nausea And Vomiting  . Peach [Prunus Persica] Nausea And Vomiting    I personally reviewed active problem list, medication list, allergies, family history, social history, health maintenance, notes from last encounter, lab results, imaging with the patient/caregiver today.   Review of Systems  10 Systems reviewed and are negative for acute change except as noted in the HPI.   Objective:   Virtual encounter, vitals limited, only able to obtain the following Today's Vitals   07/27/19 1116  Weight: 166 lb (75.3 kg)  Height: 5' (1.524 m)   Body mass index is 32.42 kg/m. Nursing Note and Vital Signs reviewed.  Physical Exam Well-appearing female, obese, no respiratory distress, poor resolution of visualization on virtual encounter of her left ankle with a small area of erythema and 2 notable small scabs, nontender to palpation, no active drainage or purulence no visible rash  PE limited by telephone encounter  No results found for this or any previous visit (from the past 72 hour(s)).  Assessment and Plan:     ICD-10-CM   1. Rash and nonspecific skin eruption  R21 hydrOXYzine (ATARAX/VISTARIL) 10 MG tablet  Patient did not actually see a insect did not have a tick on her, she is concerned it may have been a spider bite, causes unknown but it may have been mosquitoes or another plant exposure.  She only had 2 small vesicles which resolved and are scabbed and appear to be healing.  The surrounding area is minimally edematous and erythematous and has been having some response to topical hydrocortisone cream.  She has not done Benadryl with any hydrocortisone cream the same time.  Overall is improving.  Did encourage her to try hydroxyzine see if this dose would help with the itching while she works without causing too much sedation or side effects.  Encouraged her avoid  applying hydrocortisone cream to open excoriated or healing areas of skin but she could apply to the periphery, encouraged her to use it small amount of antibiotic ointment over the scabbed area and continue to monitor, follow-up in person if any worsening but it does not currently appear consistent with cellulitis more like a local allergic reaction.  With her history of severe seasonal allergies already on antihistamines and Singulair she may have a prolonged reaction and delayed resolution of her symptoms.  We discussed concerning signs and symptoms which she should follow-up for and she verbalized understanding.  -Red flags and when to present for emergency care or RTC including fever >101.81F, chest pain, shortness of breath, new/worsening/un-resolving symptoms, reviewed with patient at time of visit. Follow up and care instructions discussed and provided in AVS. - I discussed the assessment and treatment plan with the patient. The patient was provided an opportunity to ask questions and all were answered. The patient  agreed with the plan and demonstrated an understanding of the instructions.  I provided 20+ minutes of non-face-to-face time during this encounter.  Delsa Grana, PA-C 07/27/19 11:42 AM

## 2019-07-29 DIAGNOSIS — J3089 Other allergic rhinitis: Secondary | ICD-10-CM | POA: Diagnosis not present

## 2019-07-29 DIAGNOSIS — J3081 Allergic rhinitis due to animal (cat) (dog) hair and dander: Secondary | ICD-10-CM | POA: Diagnosis not present

## 2019-07-29 DIAGNOSIS — J301 Allergic rhinitis due to pollen: Secondary | ICD-10-CM | POA: Diagnosis not present

## 2019-07-30 ENCOUNTER — Encounter: Payer: Self-pay | Admitting: Family Medicine

## 2019-08-05 DIAGNOSIS — J301 Allergic rhinitis due to pollen: Secondary | ICD-10-CM | POA: Diagnosis not present

## 2019-08-05 DIAGNOSIS — J3089 Other allergic rhinitis: Secondary | ICD-10-CM | POA: Diagnosis not present

## 2019-08-05 DIAGNOSIS — J3081 Allergic rhinitis due to animal (cat) (dog) hair and dander: Secondary | ICD-10-CM | POA: Diagnosis not present

## 2019-08-12 DIAGNOSIS — J301 Allergic rhinitis due to pollen: Secondary | ICD-10-CM | POA: Diagnosis not present

## 2019-08-12 DIAGNOSIS — J3089 Other allergic rhinitis: Secondary | ICD-10-CM | POA: Diagnosis not present

## 2019-08-12 DIAGNOSIS — J3081 Allergic rhinitis due to animal (cat) (dog) hair and dander: Secondary | ICD-10-CM | POA: Diagnosis not present

## 2019-08-19 DIAGNOSIS — J3089 Other allergic rhinitis: Secondary | ICD-10-CM | POA: Diagnosis not present

## 2019-08-19 DIAGNOSIS — J3081 Allergic rhinitis due to animal (cat) (dog) hair and dander: Secondary | ICD-10-CM | POA: Diagnosis not present

## 2019-08-19 DIAGNOSIS — J301 Allergic rhinitis due to pollen: Secondary | ICD-10-CM | POA: Diagnosis not present

## 2019-08-26 DIAGNOSIS — J3081 Allergic rhinitis due to animal (cat) (dog) hair and dander: Secondary | ICD-10-CM | POA: Diagnosis not present

## 2019-08-26 DIAGNOSIS — J3089 Other allergic rhinitis: Secondary | ICD-10-CM | POA: Diagnosis not present

## 2019-08-26 DIAGNOSIS — J301 Allergic rhinitis due to pollen: Secondary | ICD-10-CM | POA: Diagnosis not present

## 2019-09-02 DIAGNOSIS — J3089 Other allergic rhinitis: Secondary | ICD-10-CM | POA: Diagnosis not present

## 2019-09-02 DIAGNOSIS — J3081 Allergic rhinitis due to animal (cat) (dog) hair and dander: Secondary | ICD-10-CM | POA: Diagnosis not present

## 2019-09-02 DIAGNOSIS — J301 Allergic rhinitis due to pollen: Secondary | ICD-10-CM | POA: Diagnosis not present

## 2019-09-09 DIAGNOSIS — J301 Allergic rhinitis due to pollen: Secondary | ICD-10-CM | POA: Diagnosis not present

## 2019-09-09 DIAGNOSIS — J3081 Allergic rhinitis due to animal (cat) (dog) hair and dander: Secondary | ICD-10-CM | POA: Diagnosis not present

## 2019-09-09 DIAGNOSIS — J3089 Other allergic rhinitis: Secondary | ICD-10-CM | POA: Diagnosis not present

## 2019-09-16 DIAGNOSIS — J3081 Allergic rhinitis due to animal (cat) (dog) hair and dander: Secondary | ICD-10-CM | POA: Diagnosis not present

## 2019-09-16 DIAGNOSIS — J3089 Other allergic rhinitis: Secondary | ICD-10-CM | POA: Diagnosis not present

## 2019-09-16 DIAGNOSIS — J301 Allergic rhinitis due to pollen: Secondary | ICD-10-CM | POA: Diagnosis not present

## 2019-09-23 DIAGNOSIS — J3089 Other allergic rhinitis: Secondary | ICD-10-CM | POA: Diagnosis not present

## 2019-09-23 DIAGNOSIS — J3081 Allergic rhinitis due to animal (cat) (dog) hair and dander: Secondary | ICD-10-CM | POA: Diagnosis not present

## 2019-09-23 DIAGNOSIS — J301 Allergic rhinitis due to pollen: Secondary | ICD-10-CM | POA: Diagnosis not present

## 2019-10-07 DIAGNOSIS — J3089 Other allergic rhinitis: Secondary | ICD-10-CM | POA: Diagnosis not present

## 2019-10-07 DIAGNOSIS — J3081 Allergic rhinitis due to animal (cat) (dog) hair and dander: Secondary | ICD-10-CM | POA: Diagnosis not present

## 2019-10-07 DIAGNOSIS — J301 Allergic rhinitis due to pollen: Secondary | ICD-10-CM | POA: Diagnosis not present

## 2019-10-14 DIAGNOSIS — J3081 Allergic rhinitis due to animal (cat) (dog) hair and dander: Secondary | ICD-10-CM | POA: Diagnosis not present

## 2019-10-14 DIAGNOSIS — J3089 Other allergic rhinitis: Secondary | ICD-10-CM | POA: Diagnosis not present

## 2019-10-14 DIAGNOSIS — J301 Allergic rhinitis due to pollen: Secondary | ICD-10-CM | POA: Diagnosis not present

## 2019-10-19 DIAGNOSIS — J3089 Other allergic rhinitis: Secondary | ICD-10-CM | POA: Diagnosis not present

## 2019-10-19 DIAGNOSIS — J3081 Allergic rhinitis due to animal (cat) (dog) hair and dander: Secondary | ICD-10-CM | POA: Diagnosis not present

## 2019-10-19 DIAGNOSIS — J4530 Mild persistent asthma, uncomplicated: Secondary | ICD-10-CM | POA: Diagnosis not present

## 2019-10-19 DIAGNOSIS — J301 Allergic rhinitis due to pollen: Secondary | ICD-10-CM | POA: Diagnosis not present

## 2019-10-19 DIAGNOSIS — H1045 Other chronic allergic conjunctivitis: Secondary | ICD-10-CM | POA: Diagnosis not present

## 2019-10-19 MED FILL — ALBUTEROL SULFATE HFA 108 (: 108 (90 BAS | 17 days supply | Qty: 7 | Fill #0

## 2019-10-19 MED FILL — LEVOCETIRIZINE 5 MG TABLET: 5 | 30 days supply | Qty: 30 | Fill #0

## 2019-10-19 MED FILL — AZELASTINE HCL 137 MCG SPRY: 0.1 | 25 days supply | Qty: 30 | Fill #0

## 2019-10-19 MED FILL — MONTELUKAST SOD 10 MG TAB: 10 | 30 days supply | Qty: 30 | Fill #0

## 2019-10-19 MED FILL — FLUTICASONE PROP 50 MCG SPR: 50 | 30 days supply | Qty: 16 | Fill #0

## 2019-10-19 MED FILL — OLOPATADINE HCL 0.2 % SOLN: 0.2 | 30 days supply | Qty: 3 | Fill #0

## 2019-10-29 MED FILL — ALBUTEROL SULFATE HFA 108 (: 108 (90 BAS | 17 days supply | Qty: 7 | Fill #0

## 2019-11-02 ENCOUNTER — Encounter: Payer: Self-pay | Admitting: Family Medicine

## 2019-11-04 DIAGNOSIS — J301 Allergic rhinitis due to pollen: Secondary | ICD-10-CM | POA: Diagnosis not present

## 2019-11-04 DIAGNOSIS — J3089 Other allergic rhinitis: Secondary | ICD-10-CM | POA: Diagnosis not present

## 2019-11-04 DIAGNOSIS — J3081 Allergic rhinitis due to animal (cat) (dog) hair and dander: Secondary | ICD-10-CM | POA: Diagnosis not present

## 2019-11-08 MED FILL — LOSARTAN POTASSIUM 25 MG TA: 25 | 30 days supply | Qty: 15 | Fill #1

## 2019-11-18 DIAGNOSIS — J301 Allergic rhinitis due to pollen: Secondary | ICD-10-CM | POA: Diagnosis not present

## 2019-11-18 DIAGNOSIS — J3081 Allergic rhinitis due to animal (cat) (dog) hair and dander: Secondary | ICD-10-CM | POA: Diagnosis not present

## 2019-11-18 DIAGNOSIS — J3089 Other allergic rhinitis: Secondary | ICD-10-CM | POA: Diagnosis not present

## 2019-11-25 DIAGNOSIS — J3081 Allergic rhinitis due to animal (cat) (dog) hair and dander: Secondary | ICD-10-CM | POA: Diagnosis not present

## 2019-11-25 DIAGNOSIS — J3089 Other allergic rhinitis: Secondary | ICD-10-CM | POA: Diagnosis not present

## 2019-11-25 DIAGNOSIS — J301 Allergic rhinitis due to pollen: Secondary | ICD-10-CM | POA: Diagnosis not present

## 2019-12-02 DIAGNOSIS — J3089 Other allergic rhinitis: Secondary | ICD-10-CM | POA: Diagnosis not present

## 2019-12-02 DIAGNOSIS — J3081 Allergic rhinitis due to animal (cat) (dog) hair and dander: Secondary | ICD-10-CM | POA: Diagnosis not present

## 2019-12-02 DIAGNOSIS — J301 Allergic rhinitis due to pollen: Secondary | ICD-10-CM | POA: Diagnosis not present

## 2019-12-16 DIAGNOSIS — J3081 Allergic rhinitis due to animal (cat) (dog) hair and dander: Secondary | ICD-10-CM | POA: Diagnosis not present

## 2019-12-16 DIAGNOSIS — J301 Allergic rhinitis due to pollen: Secondary | ICD-10-CM | POA: Diagnosis not present

## 2019-12-16 DIAGNOSIS — J3089 Other allergic rhinitis: Secondary | ICD-10-CM | POA: Diagnosis not present

## 2019-12-23 DIAGNOSIS — J3081 Allergic rhinitis due to animal (cat) (dog) hair and dander: Secondary | ICD-10-CM | POA: Diagnosis not present

## 2019-12-23 DIAGNOSIS — J301 Allergic rhinitis due to pollen: Secondary | ICD-10-CM | POA: Diagnosis not present

## 2019-12-23 DIAGNOSIS — J3089 Other allergic rhinitis: Secondary | ICD-10-CM | POA: Diagnosis not present

## 2019-12-27 ENCOUNTER — Ambulatory Visit
Admission: RE | Admit: 2019-12-27 | Discharge: 2019-12-27 | Disposition: A | Discharge status: Home / Self Care | Payer: 59 | Attending: Family Medicine | Admitting: Family Medicine

## 2019-12-27 ENCOUNTER — Other Ambulatory Visit: Payer: Self-pay

## 2019-12-27 ENCOUNTER — Ambulatory Visit: Payer: 59 | Admitting: Family Medicine

## 2019-12-27 ENCOUNTER — Encounter: Payer: Self-pay | Admitting: Family Medicine

## 2019-12-27 ENCOUNTER — Ambulatory Visit
Admission: RE | Admit: 2019-12-27 | Discharge: 2019-12-27 | Disposition: A | Discharge status: Home / Self Care | Payer: 59 | Source: Ambulatory Visit | Attending: Family Medicine | Admitting: Family Medicine

## 2019-12-27 VITALS — BP 128/80 | HR 92 | Temp 98.2°F | Resp 16 | Ht 60.0 in | Wt 174.5 lb

## 2019-12-27 DIAGNOSIS — M545 Low back pain: Secondary | ICD-10-CM | POA: Diagnosis not present

## 2019-12-27 DIAGNOSIS — M5441 Lumbago with sciatica, right side: Secondary | ICD-10-CM

## 2019-12-27 DIAGNOSIS — E785 Hyperlipidemia, unspecified: Secondary | ICD-10-CM

## 2019-12-27 DIAGNOSIS — I10 Essential (primary) hypertension: Secondary | ICD-10-CM

## 2019-12-27 DIAGNOSIS — R7303 Prediabetes: Secondary | ICD-10-CM | POA: Diagnosis not present

## 2019-12-27 DIAGNOSIS — Z5181 Encounter for therapeutic drug level monitoring: Secondary | ICD-10-CM | POA: Diagnosis not present

## 2019-12-27 DIAGNOSIS — M533 Sacrococcygeal disorders, not elsewhere classified: Secondary | ICD-10-CM | POA: Diagnosis not present

## 2019-12-27 DIAGNOSIS — M5136 Other intervertebral disc degeneration, lumbar region: Secondary | ICD-10-CM | POA: Diagnosis not present

## 2019-12-27 DIAGNOSIS — M5137 Other intervertebral disc degeneration, lumbosacral region: Secondary | ICD-10-CM | POA: Diagnosis not present

## 2019-12-27 NOTE — Progress Notes (Signed)
Name: Tracey Huff   MRN: [id number]    DOB: 1971/03/26   Date:12/27/2019       Progress Note  Chief Complaint  Patient presents with  . Follow-up  . Hyperlipidemia  . Hypertension  . Prediabetes     Subjective:   Tracey Huff is a 49 y.o. female, presents to clinic for 6 month f/u to discuss Hypertension, Hyperlipidemia and Prediabetes.   Hypertension:  Currently managed on losartan 25 mg  Pt reports good med compliance and denies any SE.   Blood pressure today is well controlled. BP Readings from Last 3 Encounters:  12/27/19 128/80  06/28/19 138/80  05/05/19 (!) 155/101   Pt denies CP, SOB, exertional sx, LE edema, palpitation, Ha's, visual disturbances, lightheadedness, hypotension, syncope.  Hyperlipidemia: Pt working on exercising and working on diet Last Lipids: Lab Results  Component Value Date   CHOL 261 (H) 06/28/2019   HDL 57 06/28/2019   LDLCALC 182 (H) 06/28/2019   TRIG 97 06/28/2019   CHOLHDL 4.6 06/28/2019   - Denies: Chest pain, shortness of breath, myalgias, claudication    PREDIABETES:   She has been working on diet and exercising more Lab Results  Component Value Date   HGBA1C 6.4 (H) 06/28/2019   HGBA1C 6.3 (H) 12/28/2018   HGBA1C 6.2 (H) 06/23/2018   Lab Results  Component Value Date   LDLCALC 182 (H) 06/28/2019   CREATININE 0.82 06/28/2019   Back pain: Back Pain This is a chronic problem. The current episode started more than 1 year ago. The problem occurs intermittently. The problem has been gradually worsening since onset. The pain is present in the lumbar spine and sacro-iliac. The quality of the pain is described as cramping, burning, shooting and aching. The pain radiates to the right knee and right thigh. The pain is moderate. Exacerbated by: immobility - feels better when moving and exercising. Pertinent negatives include no abdominal pain, bladder incontinence, bowel incontinence, chest pain, dysuria, leg pain, numbness,  paresis, paresthesias, pelvic pain, perianal numbness, tingling, weakness or weight loss. Risk factors include obesity. She has tried muscle relaxant, home exercises, heat, ice and walking for the symptoms. The treatment provided mild relief.  Right low lumbar back pain, coming and going for over a year, seems to have worsened recently, from right low back to right SI joint area and sometimes radiating down leg, previously declined PT referral, no past imaging     Current Outpatient Medications:  .  albuterol (PROVENTIL HFA;VENTOLIN HFA) 108 (90 BASE) MCG/ACT inhaler, Inhale 1-2 puffs into the lungs every 6 (six) hours as needed for wheezing or shortness of breath., Disp: , Rfl:  .  Ascorbic Acid (VITAMIN C) 100 MG tablet, Take 100 mg by mouth daily., Disp: , Rfl:  .  budesonide-formoterol (SYMBICORT) 160-4.5 MCG/ACT inhaler, Inhale into the lungs., Disp: , Rfl:  .  busPIRone (BUSPAR) 7.5 MG tablet, Take 1 tablet once daily at night; may take 1 additional tablet daily if needed for anxiety., Disp: 180 tablet, Rfl: 1 .  Cholecalciferol 25 MCG (1000 UT) capsule, Take by mouth., Disp: , Rfl:  .  ELDERBERRY PO, Take by mouth., Disp: , Rfl:  .  hydrOXYzine (ATARAX/VISTARIL) 10 MG tablet, Take 1-2 tablets (10-20 mg total) by mouth 3 (three) times daily as needed for itching (rash)., Disp: 60 tablet, Rfl: 0 .  levocetirizine (XYZAL) 5 MG tablet, Take 5 mg by mouth every evening., Disp: , Rfl:  .  losartan (COZAAR) 25 MG tablet,  Take 0.5 tablets (12.5 mg total) by mouth daily., Disp: 90 tablet, Rfl: 1 .  montelukast (SINGULAIR) 10 MG tablet, Take by mouth., Disp: , Rfl:  .  Multiple Vitamin (MULTI-VITAMIN DAILY PO), Take by mouth., Disp: , Rfl:  .  tiZANidine (ZANAFLEX) 4 MG tablet, Take 0.5-1.5 tablets (2-6 mg total) by mouth every 8 (eight) hours as needed for muscle spasms., Disp: 30 tablet, Rfl: 0 .  EPINEPHrine 0.15 MG/0.15ML IJ injection, Inject into the skin. (Patient not taking: Reported on  12/27/2019), Disp: , Rfl:   Patient Active Problem List   Diagnosis Date Noted  . CIN III (cervical intraepithelial neoplasia grade III) with severe dysplasia 08/11/2018  . Atypical squamous cell changes of undetermined significance (ASCUS) on cervical cytology with positive high risk human papilloma virus (HPV) 06/29/2018  . Prediabetes 06/24/2018  . Class 1 obesity due to excess calories with serious comorbidity and body mass index (BMI) of 33.0 to 33.9 in adult 05/20/2018  . Essential hypertension 05/20/2018  . Hyperlipidemia 05/20/2018  . Asthma 05/20/2018  . Anxiety in acute stress reaction 05/20/2018  . Status post hysterectomy 11/15/2014    Past Surgical History:  Procedure Laterality Date  . ABDOMINAL HYSTERECTOMY N/A 11/15/2014   Procedure: HYSTERECTOMY ABDOMINAL/BILATERAL SALPINGECTOMY ;  Surgeon: Will Bonnet, MD;  Location: [facility];  Service: Gynecology;  Laterality: N/A;  . CESAREAN SECTION    . CYSTOSCOPY N/A 11/15/2014   Procedure: CYSTOSCOPY;  Surgeon: Will Bonnet, MD;  Location: [facility];  Service: Gynecology;  Laterality: N/A;  . DILATION AND CURETTAGE OF UTERUS    . GANGLION CYST EXCISION Left   . UTERINE FIBROID SURGERY      Family History  Problem Relation Age of Onset  . Congestive Heart Failure Mother   . Diabetes Mother   . Hypertension Mother   . Diabetes Maternal Grandmother   . Congestive Heart Failure Maternal Grandfather     Social History   Tobacco Use  . Smoking status: Never Smoker  . Smokeless tobacco: Never Used  Vaping Use  . Vaping Use: Never used  Substance Use Topics  . Alcohol use: Not Currently    Comment: occ  . Drug use: No     Allergies  Allergen Reactions  . Macadamia Nut Oil Shortness Of Breath  . Apple Nausea And Vomiting  . Carrot Oil     Can not cooked raw carrots  . Fruit & Vegetable Daily [Nutritional Supplements] Nausea And Vomiting    Cannot tolerate apples, peaches, plums, and nectarines  . Kiwi  Extract Nausea And Vomiting  . Peach [Prunus Persica] Nausea And Vomiting    Health Maintenance  Topic Date Due  . Hepatitis C Screening  Never done  . TETANUS/TDAP  Never done  . MAMMOGRAM  09/17/2019  . INFLUENZA VACCINE  12/05/2019  . PAP SMEAR-Modifier  06/23/2021  . COVID-19 Vaccine  Completed  . HIV Screening  Completed    Chart Review Today: I personally reviewed active problem list, medication list, allergies, family history, social history, health maintenance, notes from last encounter, lab results, imaging with the patient/caregiver today.   Review of Systems  Constitutional: Negative.  Negative for weight loss.  HENT: Negative.   Eyes: Negative.   Respiratory: Negative.   Cardiovascular: Negative.  Negative for chest pain.  Gastrointestinal: Negative.  Negative for abdominal pain and bowel incontinence.  Endocrine: Negative.   Genitourinary: Negative.  Negative for bladder incontinence, dysuria and pelvic pain.  Musculoskeletal: Positive for back pain.  Skin: Negative.   Allergic/Immunologic: Negative.   Neurological: Negative.  Negative for tingling, weakness, numbness and paresthesias.  Hematological: Negative.   Psychiatric/Behavioral: Negative.   All other systems reviewed and are negative.    Objective:   Vitals:   12/27/19 1120  BP: 128/80  Pulse: 92  Resp: 16  Temp: 98.2 F (36.8 C)  TempSrc: Oral  SpO2: 99%  Weight: 174 lb 8 oz (79.2 kg)  Height: 5' (1.524 m)    Body mass index is 34.08 kg/m.  Physical Exam Vitals and nursing note reviewed.  Constitutional:      General: She is not in acute distress.    Appearance: Normal appearance. She is well-developed. She is not ill-appearing, toxic-appearing or diaphoretic.     Interventions: Face mask in place.  HENT:     Head: Normocephalic and atraumatic.     Right Ear: External ear normal.     Left Ear: External ear normal.  Eyes:     General: Lids are normal. No scleral icterus.        Right eye: No discharge.        Left eye: No discharge.     Conjunctiva/sclera: Conjunctivae normal.  Neck:     Trachea: Phonation normal. No tracheal deviation.  Cardiovascular:     Rate and Rhythm: Normal rate and regular rhythm.     Pulses: Normal pulses.          Radial pulses are 2+ on the right side and 2+ on the left side.       Posterior tibial pulses are 2+ on the right side and 2+ on the left side.     Heart sounds: Normal heart sounds. No murmur heard.  No friction rub. No gallop.   Pulmonary:     Effort: Pulmonary effort is normal. No respiratory distress.     Breath sounds: Normal breath sounds. No stridor. No wheezing, rhonchi or rales.  Chest:     Chest wall: No tenderness.  Abdominal:     General: Bowel sounds are normal. There is no distension.     Palpations: Abdomen is soft.  Musculoskeletal:     Cervical back: Normal.     Thoracic back: Normal.     Lumbar back: Tenderness present. No bony tenderness. Decreased range of motion.     Right lower leg: No edema.     Left lower leg: No edema.     Comments: Right lumbar paraspinal muscle to right SI joint area ttp No midline tenderness from cervical to lumbar spine  Skin:    General: Skin is warm and dry.     Coloration: Skin is not jaundiced or pale.     Findings: No rash.  Neurological:     Mental Status: She is alert.     Motor: No abnormal muscle tone.     Gait: Gait normal.  Psychiatric:        Mood and Affect: Mood normal.        Speech: Speech normal.        Behavior: Behavior normal.         Assessment & Plan:   1. Essential hypertension Stable, well controlled BP at goal today, continue losartan and DASH - COMPLETE METABOLIC PANEL WITH GFR  2. Hyperlipidemia, unspecified hyperlipidemia type Very high LDL, likely familial, with some hx of heart disease and malignant HTN in family - parents/siblings I explained that I would recommend a statin if her LDL continued to be >160 Continue to  work  on diet and exercise - COMPLETE METABOLIC PANEL WITH GFR - Lipid panel  3. Prediabetes Recheck labs, she has been working on diet/lifestyle/exercising - COMPLETE METABOLIC PANEL WITH GFR - Hemoglobin A1c  4. Right-sided low back pain with right-sided sciatica, unspecified chronicity Right lumbar to SI joint area ttp - with what sounds like lumbosacral radiculopathy Encouraged tylenol, NSAIDs, muscle relaxers prn Frequent position changing and heat tx Strongly encouraged her to get evaluated by PT Onset of back pain a year ago - feel screening xrays appropriate, if failed PT then would refer to spine specialists or try to get MRI done  Hopefully PT will be very helpful - DG Lumbar Spine Complete; Future - DG Si Joints; Future - Ambulatory referral to Physical Therapy  5. Encounter for medication monitoring  - COMPLETE METABOLIC PANEL WITH GFR - Lipid panel - Hemoglobin A1c   Return in about 6 months (around 06/28/2020) for Routine follow-up.   Delsa Grana, PA-C 12/27/19 4:56 PM

## 2019-12-28 LAB — COMPLETE METABOLIC PANEL WITH GFR
AG Ratio: 1.2 (calc) (ref 1.0–2.5)
ALT: 13 U/L (ref 6–29)
AST: 16 U/L (ref 10–35)
Albumin: 4 g/dL (ref 3.6–5.1)
Alkaline phosphatase (APISO): 44 U/L (ref 31–125)
BUN: 10 mg/dL (ref 7–25)
CO2: 24 mmol/L (ref 20–32)
Calcium: 9.2 mg/dL (ref 8.6–10.2)
Chloride: 103 mmol/L (ref 98–110)
Creat: 0.82 mg/dL (ref 0.50–1.10)
GFR, Est African American: 97 mL/min/{1.73_m2} (ref >=60)
GFR, Est Non African American: 84 mL/min/{1.73_m2} (ref >=60)
Globulin: 3.3 g/dL (calc) (ref 1.9–3.7)
Glucose, Bld: 85 mg/dL (ref 65–99)
Potassium: 4 mmol/L (ref 3.5–5.3)
Sodium: 136 mmol/L (ref 135–146)
Total Bilirubin: 0.5 mg/dL (ref 0.2–1.2)
Total Protein: 7.3 g/dL (ref 6.1–8.1)

## 2019-12-28 LAB — LIPID PANEL
Cholesterol: 251 mg/dL — ABNORMAL HIGH (ref <200)
HDL: 53 mg/dL (ref >=50)
LDL Cholesterol (Calc): 177 mg/dL (calc) — ABNORMAL HIGH
Non-HDL Cholesterol (Calc): 198 mg/dL (calc) — ABNORMAL HIGH (ref <130)
Total CHOL/HDL Ratio: 4.7 (calc) (ref <5.0)
Triglycerides: 94 mg/dL (ref <150)

## 2019-12-28 LAB — HEMOGLOBIN A1C
Hgb A1c MFr Bld: 6.3 % of total Hgb — ABNORMAL HIGH (ref <5.7)
Mean Plasma Glucose: 134 (calc)
eAG (mmol/L): 7.4 (calc)

## 2019-12-28 MED ORDER — ATORVASTATIN CALCIUM 20 MG PO TABS: 20.0000 mg | ORAL_TABLET | Freq: Every day | ORAL | 3 refills | Status: DC

## 2019-12-28 MED FILL — ATORVASTATIN 20 MG TABLET: 20 | 90 days supply | Qty: 90 | Fill #0

## 2019-12-28 NOTE — Addendum Note (Signed)
Addended by: Delsa Grana on: 12/28/2019 12:28 PM   Modules accepted: Orders

## 2019-12-29 ENCOUNTER — Other Ambulatory Visit: Payer: Self-pay | Admitting: Family Medicine

## 2019-12-29 DIAGNOSIS — I10 Essential (primary) hypertension: Secondary | ICD-10-CM

## 2019-12-29 MED FILL — LOSARTAN POTASSIUM 25 MG TA: 25 | 90 days supply | Qty: 45 | Fill #0

## 2019-12-30 DIAGNOSIS — J3081 Allergic rhinitis due to animal (cat) (dog) hair and dander: Secondary | ICD-10-CM | POA: Diagnosis not present

## 2019-12-30 DIAGNOSIS — J301 Allergic rhinitis due to pollen: Secondary | ICD-10-CM | POA: Diagnosis not present

## 2019-12-30 DIAGNOSIS — J3089 Other allergic rhinitis: Secondary | ICD-10-CM | POA: Diagnosis not present

## 2020-01-04 ENCOUNTER — Ambulatory Visit: Payer: 59 | Admitting: Physical Therapy

## 2020-01-06 ENCOUNTER — Ambulatory Visit: Payer: 59 | Admitting: Physical Therapy

## 2020-01-06 DIAGNOSIS — J3081 Allergic rhinitis due to animal (cat) (dog) hair and dander: Secondary | ICD-10-CM | POA: Diagnosis not present

## 2020-01-06 DIAGNOSIS — J301 Allergic rhinitis due to pollen: Secondary | ICD-10-CM | POA: Diagnosis not present

## 2020-01-06 DIAGNOSIS — J3089 Other allergic rhinitis: Secondary | ICD-10-CM | POA: Diagnosis not present

## 2020-01-07 ENCOUNTER — Encounter: Payer: Self-pay | Admitting: Physical Therapy

## 2020-01-07 ENCOUNTER — Ambulatory Visit: Payer: 59 | Attending: Family Medicine | Admitting: Physical Therapy

## 2020-01-07 DIAGNOSIS — G8929 Other chronic pain: Secondary | ICD-10-CM | POA: Diagnosis not present

## 2020-01-07 DIAGNOSIS — R293 Abnormal posture: Secondary | ICD-10-CM | POA: Diagnosis not present

## 2020-01-07 DIAGNOSIS — M5441 Lumbago with sciatica, right side: Secondary | ICD-10-CM | POA: Diagnosis not present

## 2020-01-07 NOTE — Therapy (Addendum)
[facility] [address] [place], [address] Phone: [phone]   Fax:  [phone]  Physical Therapy Treatment  Patient Details  Name: Tracey Huff MRN: [id number] Date of Birth: April 26, 1971 No data recorded  Encounter Date: 01/07/2020    Past Medical History:  Diagnosis Date  . Allergy   . Asthma   . Fibroid uterus 11/15/2014  . Headache   . Hypertension   . Menorrhagia with irregular cycle 11/15/2014  . Shortness of breath dyspnea     Past Surgical History:  Procedure Laterality Date  . ABDOMINAL HYSTERECTOMY N/A 11/15/2014   Procedure: HYSTERECTOMY ABDOMINAL/BILATERAL SALPINGECTOMY ;  Surgeon: Will Bonnet, MD;  Location: [facility];  Service: Gynecology;  Laterality: N/A;  . CESAREAN SECTION    . CYSTOSCOPY N/A 11/15/2014   Procedure: CYSTOSCOPY;  Surgeon: Will Bonnet, MD;  Location: [facility];  Service: Gynecology;  Laterality: N/A;  . DILATION AND CURETTAGE OF UTERUS    . GANGLION CYST EXCISION Left   . UTERINE FIBROID SURGERY      There were no vitals filed for this visit.      OBJECTIVE  Mental Status Patient is oriented to person, place and time.  Recent memory is intact.  Remote memory is intact.  Attention span and concentration are intact.  Expressive speech is intact.  Patient's fund of knowledge is within normal limits for educational level.  SENSATION: Grossly intact to light touch bilateral LEs as determined by testing dermatomes L2-S2 Proprioception and hot/cold testing deferred on this date   MUSCULOSKELETAL: Tremor: None Bulk: Normal Tone: Normal No visible step-off along spinal column  Posture Lumbar lordosis: WNL Iliac crest height: equal bilaterally Lumbar lateral shift: negative Lower crossed syndrome (tight hip flexors and erector spinae; weak gluts and abs): negative  Gait Normalized gait   Palpation TTP at R QL and lumbar parapsinals. No TTP  at glute musculature, latent trigger points at superior glute fibers   Strength (out of 5) R/L 5/5 Hip flexion 5/5 Hip ER 5/5 Hip IR 4/5 Hip abduction 4+/5 Hip adduction 4+/5 Hip extension 5/5 Knee extension 5/5 Knee flexion 5/5 Ankle dorsiflexion 5/5 Ankle plantarflexion 5 Trunk flexion 5 Trunk extension 5/5 Trunk rotation  *Indicates pain   AROM (degrees) R/L (all movements include overpressure unless otherwise stated) All hip and lumbar/thoracic motions WNL Some pain initially with forward flexion, that subsides if positioned is maintained *Indicates pain   PROM (degrees) PROM = AROM  Repeated Movements No centralization or peripheralization of symptoms with repeated lumbar extension or flexion. Patient does report she does extension based intervention to relieve pain (ie leaning back over theraball, putting bilat UE behind back   Muscle Length Hamstrings:WNL bilat [place]: WNL bilat Thomas: WNL bilat Ober: WNL bilat   Passive Accessory Intervertebral Motion (PAIVM) Pt denies reproduction of back pain with CPA L1-L5 and UPA bilaterally L1-L5. Generally hypomobile throughout  Passive Physiological Intervertebral Motion (PPIVM) Normal flexion and extension with PPIVM testing   SPECIAL TESTS Lumbar Radiculopathy and Discogenic: Centralization and Peripheralization (SN 92, -LR 0.12): Difficult presentation this session, subjective report of centralization with ext, not objectively seen  Slump (SN 83, -LR 0.32): R: Positive L: Negative SLR (SN 92, -LR 0.29): Negative Crossed SLR (SP 90): Negative  Facet Joint: Extension-Rotation (SN 100, -LR 0.0): Neg bilat  Lumbar Spinal Stenosis: Lumbar quadrant (SN 70): Negative bilat  Hip: FABER (SN 81): Positive bilat FADIR (SN 94) Negative bilat Hip scour (SN  50): Negative bilat  SIJ:  Thigh Thrust (SN 88, -LR 0.18) :Negative bilat  Piriformis Syndrome: FAIR Test (SN 88, SP 83): Negative bilat   Functional  Tasks Sit to stand: Normal Deep Squat: Bilat heel lift, decent trunk posture   Ther-Ex PT reviewed the following HEP with patient with patient able to demonstrate a set of the following with min cuing for correction needed. PT educated patient on parameters of therex (how/when to inc/decrease intensity, frequency, rep/set range, stretch hold time, and purpose of therex) with verbalized understanding.  Access Code: [email or web address] Exercises Seated Piriformis Stretch with Trunk Bend - 3 x daily - 7 x weekly - 34min hold Seated Child's Pose with Table - 3 x daily - 7 x weekly - 37min hold Standing Lumbar Extension - 8 x daily - 7 x weekly - 10 reps - 2sec hold                    PT Short Term Goals - 01/07/20 0957      PT SHORT TERM GOAL #1   Title Pt will be independent with HEP in order to improve strength and decrease back pain in order to improve pain-free function at home and work.    Baseline 01/07/20 HEP given    Time 4    Period Weeks    Status New             PT Long Term Goals - 01/07/20 8366      PT LONG TERM GOAL #1   Title Pt will decrease worst back pain as reported on NPRS by at least 2 points in order to demonstrate clinically significant reduction in back pain.    Baseline 01/07/20 6/10    Time 6    Period Weeks    Status New      PT LONG TERM GOAL #2   Title Pt will increase R hip gross MMT grade to 5/5 in order to demonstrate improvement in strength and function.    Baseline 01/07/20 hip abd 4/5; add 4+/5; ext 4+/5    Time 6    Period Weeks    Status New      PT LONG TERM GOAL #3   Title Patient will increase FOTO score to 63 to demonstrate predicted increase in functional mobility to complete ADLs    Baseline 01/07/20 47    Time 6    Period Weeks    Status New                  Patient will benefit from skilled therapeutic intervention in order to improve the following deficits and impairments:  Decreased mobility, Increased muscle spasms,  Decreased activity tolerance, Decreased strength, Increased fascial restricitons, Impaired flexibility, Postural dysfunction, Pain, Obesity, Improper body mechanics, Impaired tone, Decreased range of motion  Visit Diagnosis: Chronic right-sided low back pain with right-sided sciatica  Abnormal posture     Problem List Patient Active Problem List   Diagnosis Date Noted  . CIN III (cervical intraepithelial neoplasia grade III) with severe dysplasia 08/11/2018  . Atypical squamous cell changes of undetermined significance (ASCUS) on cervical cytology with positive high risk human papilloma virus (HPV) 06/29/2018  . Prediabetes 06/24/2018  . Class 1 obesity due to excess calories with serious comorbidity and body mass index (BMI) of 33.0 to 33.9 in adult 05/20/2018  . Essential hypertension 05/20/2018  . Hyperlipidemia 05/20/2018  . Asthma 05/20/2018  . Anxiety in acute stress reaction 05/20/2018  . Status  post hysterectomy 11/15/2014   Durwin Reges DPT Durwin Reges 01/13/2020, 9:05 AM  [facility] [address], [place], [address] Phone: [phone]   Fax:  [phone]  Name: EARSIE HUMM MRN: [id number] Date of Birth: January 22, 1971

## 2020-01-11 ENCOUNTER — Encounter: Payer: Self-pay | Admitting: Physical Therapy

## 2020-01-11 ENCOUNTER — Other Ambulatory Visit: Payer: Self-pay

## 2020-01-11 ENCOUNTER — Ambulatory Visit: Payer: 59 | Admitting: Physical Therapy

## 2020-01-11 DIAGNOSIS — M5441 Lumbago with sciatica, right side: Secondary | ICD-10-CM

## 2020-01-11 DIAGNOSIS — G8929 Other chronic pain: Secondary | ICD-10-CM | POA: Diagnosis not present

## 2020-01-11 DIAGNOSIS — R293 Abnormal posture: Secondary | ICD-10-CM | POA: Diagnosis not present

## 2020-01-11 NOTE — Therapy (Signed)
[place] [facility] [address], [place], [address] Phone: [phone]   Fax:  [phone]  Physical Therapy Treatment  Patient Details  Name: Tracey Huff MRN: [id number] Date of Birth: May 10, 1970 No data recorded  Encounter Date: 01/11/2020   PT End of Session - 01/11/20 1535    Visit Number 2    Number of Visits 17    Date for PT Re-Evaluation 03/03/20    PT Start Time 0328    PT Stop Time 0406    PT Time Calculation (min) 38 min    Activity Tolerance Patient tolerated treatment well    Behavior During Therapy [facility] for tasks assessed/performed           Past Medical History:  Diagnosis Date  . Allergy   . Asthma   . Fibroid uterus 11/15/2014  . Headache   . Hypertension   . Menorrhagia with irregular cycle 11/15/2014  . Shortness of breath dyspnea     Past Surgical History:  Procedure Laterality Date  . ABDOMINAL HYSTERECTOMY N/A 11/15/2014   Procedure: HYSTERECTOMY ABDOMINAL/BILATERAL SALPINGECTOMY ;  Surgeon: Will Bonnet, MD;  Location: [facility] ORS;  Service: Gynecology;  Laterality: N/A;  . CESAREAN SECTION    . CYSTOSCOPY N/A 11/15/2014   Procedure: CYSTOSCOPY;  Surgeon: Will Bonnet, MD;  Location: [facility] ORS;  Service: Gynecology;  Laterality: N/A;  . DILATION AND CURETTAGE OF UTERUS    . GANGLION CYST EXCISION Left   . UTERINE FIBROID SURGERY      There were no vitals filed for this visit.   Subjective Assessment - 01/11/20 1534    Subjective Patiet reports compliance with HEP, which she reports she thinks is helping her not have pain when she stands. No pain currently but had trouble finding a sleeping position d/t pain.    Pertinent History Patient reports R sided LBP with insidous onset since September 2020. Reports pain is to the R of low back and can travel down RLE and RLE "feels asleep". Pain is aggravated by standing after prolonged sitting, and when she gets up in the morning.  Worst pain over the past week 6/10; best 2/10. 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 Pt denies N/V, B&B changes, unexplained weight fluctuation, saddle paresthesia, fever, night sweats, or unrelenting night pain at this time.    Limitations Sitting;House hold activities;Lifting    How long can you sit comfortably? Less than 24mins    How long can you stand comfortably? unlimited    How long can you walk comfortably? unlimited    Diagnostic tests Xray    Patient Stated Goals decrease pain    Pain Onset 1 to 4 weeks ago           Ther-Ex Nustep seat 3 UE 5 L3 22mins for gentle rotation and strengthening Seated piriformis stretch 30sec  Bridge x10 with good carry over of demo; SL bridge 2x 10 bilat with cuing to prevent lumbar ext with good carry over following Prone alt supermans 3x 10 with demo and TC needed to maintain core contraction with decent carry over Qped alt hip ext x10; alt birdog 2x 10 with max TC initially to prevent rotation and lumbar ext with core contraction with good carry over Mini squat with 10# DB BUE flex  2x 10 with cuing for technique initially with good carry over               PT Education - 01/11/20 1535    Education Details therex form/technique    Person(s) Educated Patient    Methods Explanation;Demonstration;Verbal cues    Comprehension Verbalized understanding;Returned demonstration;Verbal cues required            PT Short Term Goals - 01/07/20 0957      PT SHORT TERM GOAL #1   Title Pt will be independent with HEP in order to improve strength and decrease back pain in order to improve pain-free function at home and work.    Baseline 01/07/20 HEP given    Time 4    Period Weeks    Status New             PT Long Term Goals - 01/07/20 4270      PT LONG TERM GOAL #1   Title Pt will  decrease worst back pain as reported on NPRS by at least 2 points in order to demonstrate clinically significant reduction in back pain.    Baseline 01/07/20 6/10    Time 6    Period Weeks    Status New      PT LONG TERM GOAL #2   Title Pt will increase R hip gross MMT grade to 5/5 in order to demonstrate improvement in strength and function.    Baseline 01/07/20 hip abd 4/5; add 4+/5; ext 4+/5    Time 6    Period Weeks    Status New      PT LONG TERM GOAL #3   Title Patient will increase FOTO score to 63 to demonstrate predicted increase in functional mobility to complete ADLs    Baseline 01/07/20 47    Time 6    Period Weeks    Status New                 Plan - 01/11/20 1544    Clinical Impression Statement PT initiated therex for increased mobility and hip/core strength with good success. Patient requires multimodal cuing for success of technique and proper muscle activation, but is able to carry over all cuing with good motivation and no increased pain. PT will continue progression as able.    Personal Factors and Comorbidities Comorbidity 1;Fitness;Time since onset of injury/illness/exacerbation;Past/Current Experience    Comorbidities HTN    Examination-Activity Limitations Bed Mobility;Sit;Lift    Examination-Participation Restrictions Community Activity;Driving;Cleaning;Occupation    Stability/Clinical Decision Making Evolving/Moderate complexity    Rehab Potential Good    PT Frequency 2x / week    PT Duration 8 weeks    PT Treatment/Interventions Aquatic Therapy;Electrical Stimulation;ADLs/Self Care Home Management;Cryotherapy;Iontophoresis 4mg /ml Dexamethasone;Moist Heat;Traction;Ultrasound;Stair training;Gait training;Therapeutic exercise;Passive range of motion;Dry needling;Joint Manipulations;Spinal Manipulations;Patient/family education;Manual techniques;DME Instruction;Therapeutic activities;Functional mobility training;Neuromuscular re-education    PT Next Visit Plan  assess ext based therex, core strengthening, TND?    PT Home Exercise Plan repeated ext, piriformis stretch, seated lateral childs pose    Consulted and Agree with Plan of Care Patient           Patient will benefit from skilled therapeutic intervention in order to improve the following deficits and impairments:  Decreased mobility, Increased muscle spasms, Decreased activity tolerance, Decreased strength, Increased fascial restricitons, Impaired flexibility, Postural dysfunction, Pain, Obesity, Improper body mechanics, Impaired tone, Decreased range of motion  Visit Diagnosis: Chronic right-sided low back pain with right-sided sciatica  Abnormal posture  Problem List Patient Active Problem List   Diagnosis Date Noted  . CIN III (cervical intraepithelial neoplasia grade III) with severe dysplasia 08/11/2018  . Atypical squamous cell changes of undetermined significance (ASCUS) on cervical cytology with positive high risk human papilloma virus (HPV) 06/29/2018  . Prediabetes 06/24/2018  . Class 1 obesity due to excess calories with serious comorbidity and body mass index (BMI) of 33.0 to 33.9 in adult 05/20/2018  . Essential hypertension 05/20/2018  . Hyperlipidemia 05/20/2018  . Asthma 05/20/2018  . Anxiety in acute stress reaction 05/20/2018  . Status post hysterectomy 11/15/2014   Durwin Reges DPT Durwin Reges 01/11/2020, 4:10 PM  [place] [facility] [address] [place], [address] Phone: [phone]   Fax:  [phone]  Name: Tracey Huff MRN: [id number] Date of Birth: 12-Jul-1970

## 2020-01-13 ENCOUNTER — Ambulatory Visit: Payer: 59 | Admitting: Physical Therapy

## 2020-01-13 ENCOUNTER — Encounter: Payer: Self-pay | Admitting: Physical Therapy

## 2020-01-13 ENCOUNTER — Other Ambulatory Visit: Payer: Self-pay

## 2020-01-13 DIAGNOSIS — J301 Allergic rhinitis due to pollen: Secondary | ICD-10-CM | POA: Diagnosis not present

## 2020-01-13 DIAGNOSIS — G8929 Other chronic pain: Secondary | ICD-10-CM | POA: Diagnosis not present

## 2020-01-13 DIAGNOSIS — M5441 Lumbago with sciatica, right side: Secondary | ICD-10-CM | POA: Diagnosis not present

## 2020-01-13 DIAGNOSIS — J3081 Allergic rhinitis due to animal (cat) (dog) hair and dander: Secondary | ICD-10-CM | POA: Diagnosis not present

## 2020-01-13 DIAGNOSIS — R293 Abnormal posture: Secondary | ICD-10-CM | POA: Diagnosis not present

## 2020-01-13 DIAGNOSIS — J3089 Other allergic rhinitis: Secondary | ICD-10-CM | POA: Diagnosis not present

## 2020-01-13 NOTE — Addendum Note (Signed)
Addended by: Kelton Pillar on: 01/13/2020 09:45 AM   Modules accepted: Orders

## 2020-01-13 NOTE — Therapy (Signed)
[place] [address], [place], [address] Phone: [phone]   Fax:  [phone]  Physical Therapy Treatment  Patient Details  Name: Tracey Huff MRN: [id number] Date of Birth: February 16, 1971 No data recorded  Encounter Date: 01/13/2020   PT End of Session - 01/13/20 1424    Visit Number 3    Number of Visits 17    Date for PT Re-Evaluation 03/03/20    PT Start Time 0220    PT Stop Time 0300    PT Time Calculation (min) 40 min    Activity Tolerance Patient tolerated treatment well    Behavior During Therapy [facility] for tasks assessed/performed           Past Medical History:  Diagnosis Date   Allergy    Asthma    Fibroid uterus 11/15/2014   Headache    Hypertension    Menorrhagia with irregular cycle 11/15/2014   Shortness of breath dyspnea     Past Surgical History:  Procedure Laterality Date   ABDOMINAL HYSTERECTOMY N/A 11/15/2014   Procedure: HYSTERECTOMY ABDOMINAL/BILATERAL SALPINGECTOMY ;  Surgeon: Will Bonnet, MD;  Location: [facility] ORS;  Service: Gynecology;  Laterality: N/A;   CESAREAN SECTION     CYSTOSCOPY N/A 11/15/2014   Procedure: CYSTOSCOPY;  Surgeon: Will Bonnet, MD;  Location: [facility] ORS;  Service: Gynecology;  Laterality: N/A;   DILATION AND CURETTAGE OF UTERUS     GANGLION CYST EXCISION Left    UTERINE FIBROID SURGERY      There were no vitals filed for this visit.   Subjective Assessment - 01/13/20 1422    Subjective Patient reports very minimal back pain over the past couple days. She reports she slept very well last night. Compliant with EHP.    Pertinent History Patient reports R sided LBP with insidous onset since September 2020. Reports pain is to the R of low back and can travel down RLE and RLE "feels asleep". Pain is aggravated by standing after prolonged sitting, and when she gets up in the morning. Worst pain over the past week 6/10; best 2/10.  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 Pt denies N/V, B&B changes, unexplained weight fluctuation, saddle paresthesia, fever, night sweats, or unrelenting night pain at this time.    Limitations Sitting;House hold activities;Lifting    How long can you sit comfortably? Less than 27mins    How long can you stand comfortably? unlimited    How long can you walk comfortably? unlimited    Diagnostic tests Xray    Patient Stated Goals decrease pain    Pain Onset 1 to 4 weeks ago           Ther-Ex Nustep seat 3 UE 5 L3 21mins for gentle rotation and strengthening Deadbug position theraball squeeze 2x 10 with heavy cuing initially for proper technique with good carry over following Deadbug with theraball 2x 10 with good carry over of demo for proper technique Alt birdog 2x 10 with min cuing for neutral spine and maintaining core contraction with good carry over Seated on theraball oblique twists 3x 10 with difficulty with hip stabilization, good carry over of cuing Mini squat with 10# DB BUE flex 3x 10 with excelled carry over from previous session Seated childs pose on theraball 86min  hold min cuing for breath control with good carry over Seated piriformis stretch 2x 30sec                           PT Education - 01/13/20 1423    Education Details therex form/technique    Person(s) Educated Patient    Methods Explanation;Demonstration;Verbal cues    Comprehension Verbalized understanding;Returned demonstration;Verbal cues required            PT Short Term Goals - 01/07/20 0957      PT SHORT TERM GOAL #1   Title Pt will be independent with HEP in order to improve strength and decrease back pain in order to improve pain-free function at home and work.    Baseline 01/07/20 HEP given    Time 4    Period Weeks    Status  New             PT Long Term Goals - 01/07/20 3149      PT LONG TERM GOAL #1   Title Pt will decrease worst back pain as reported on NPRS by at least 2 points in order to demonstrate clinically significant reduction in back pain.    Baseline 01/07/20 6/10    Time 6    Period Weeks    Status New      PT LONG TERM GOAL #2   Title Pt will increase R hip gross MMT grade to 5/5 in order to demonstrate improvement in strength and function.    Baseline 01/07/20 hip abd 4/5; add 4+/5; ext 4+/5    Time 6    Period Weeks    Status New      PT LONG TERM GOAL #3   Title Patient will increase FOTO score to 63 to demonstrate predicted increase in functional mobility to complete ADLs    Baseline 01/07/20 47    Time 6    Period Weeks    Status New                 Plan - 01/13/20 1431    Clinical Impression Statement PT continued therex progression for increased core strengthening in lieu of pain with good success. Patient is able to comply with all cuing for proper technique of therex with good motivation throughout session. Patient reports no pain following sesison. PT will continue progression as able.    Personal Factors and Comorbidities Comorbidity 1;Fitness;Time since onset of injury/illness/exacerbation;Past/Current Experience    Comorbidities HTN    Examination-Activity Limitations Bed Mobility;Sit;Lift    Examination-Participation Restrictions Community Activity;Driving;Cleaning;Occupation    Stability/Clinical Decision Making Evolving/Moderate complexity    Clinical Decision Making Moderate    Rehab Potential Good    PT Frequency 2x / week    PT Duration 8 weeks    PT Treatment/Interventions Aquatic Therapy;Electrical Stimulation;ADLs/Self Care Home Management;Cryotherapy;Iontophoresis 4mg /ml Dexamethasone;Moist Heat;Traction;Ultrasound;Stair training;Gait training;Therapeutic exercise;Passive range of motion;Dry needling;Joint Manipulations;Spinal Manipulations;Patient/family  education;Manual techniques;DME Instruction;Therapeutic activities;Functional mobility training;Neuromuscular re-education    PT Next Visit Plan assess ext based therex, core strengthening, TND?    PT Home Exercise Plan repeated ext, piriformis stretch, seated lateral childs pose    Consulted and Agree with Plan of Care Patient           Patient will benefit from skilled therapeutic intervention in order to improve the following deficits and impairments:  Decreased mobility, Increased muscle spasms, Decreased activity tolerance, Decreased strength, Increased fascial restricitons, Impaired flexibility, Postural dysfunction, Pain, Obesity, Improper body  mechanics, Impaired tone, Decreased range of motion  Visit Diagnosis: Chronic right-sided low back pain with right-sided sciatica  Abnormal posture     Problem List Patient Active Problem List   Diagnosis Date Noted   CIN III (cervical intraepithelial neoplasia grade III) with severe dysplasia 08/11/2018   Atypical squamous cell changes of undetermined significance (ASCUS) on cervical cytology with positive high risk human papilloma virus (HPV) 06/29/2018   Prediabetes 06/24/2018   Class 1 obesity due to excess calories with serious comorbidity and body mass index (BMI) of 33.0 to 33.9 in adult 05/20/2018   Essential hypertension 05/20/2018   Hyperlipidemia 05/20/2018   Asthma 05/20/2018   Anxiety in acute stress reaction 05/20/2018   Status post hysterectomy 11/15/2014   Durwin Reges DPT Durwin Reges 01/13/2020, 2:55 PM  [facility] [address], [place], [address] Phone: [phone]   Fax:  [phone]  Name: Tracey Huff MRN: [id number] Date of Birth: 1971/03/19

## 2020-01-17 ENCOUNTER — Ambulatory Visit: Payer: 59 | Admitting: Physical Therapy

## 2020-01-20 ENCOUNTER — Other Ambulatory Visit: Payer: Self-pay

## 2020-01-20 ENCOUNTER — Ambulatory Visit: Payer: 59 | Admitting: Physical Therapy

## 2020-01-20 ENCOUNTER — Encounter: Payer: Self-pay | Admitting: Physical Therapy

## 2020-01-20 DIAGNOSIS — M5441 Lumbago with sciatica, right side: Secondary | ICD-10-CM | POA: Diagnosis not present

## 2020-01-20 DIAGNOSIS — J3081 Allergic rhinitis due to animal (cat) (dog) hair and dander: Secondary | ICD-10-CM | POA: Diagnosis not present

## 2020-01-20 DIAGNOSIS — J3089 Other allergic rhinitis: Secondary | ICD-10-CM | POA: Diagnosis not present

## 2020-01-20 DIAGNOSIS — R293 Abnormal posture: Secondary | ICD-10-CM | POA: Diagnosis not present

## 2020-01-20 DIAGNOSIS — G8929 Other chronic pain: Secondary | ICD-10-CM

## 2020-01-20 DIAGNOSIS — J301 Allergic rhinitis due to pollen: Secondary | ICD-10-CM | POA: Diagnosis not present

## 2020-01-20 NOTE — Therapy (Signed)
[facility] [facility] [address], [place], [address] Phone: [phone]   Fax:  [phone]  Physical Therapy Treatment  Patient Details  Name: Tracey Huff MRN: [id number] Date of Birth: January 19, 1971 No data recorded  Encounter Date: 01/20/2020   PT End of Session - 01/20/20 1711    Visit Number 4    Number of Visits 17    Date for PT Re-Evaluation 03/03/20    PT Start Time 0504    PT Stop Time 0543    PT Time Calculation (min) 39 min    Activity Tolerance Patient tolerated treatment well    Behavior During Therapy [facility] for tasks assessed/performed           Past Medical History:  Diagnosis Date  . Allergy   . Asthma   . Fibroid uterus 11/15/2014  . Headache   . Hypertension   . Menorrhagia with irregular cycle 11/15/2014  . Shortness of breath dyspnea     Past Surgical History:  Procedure Laterality Date  . ABDOMINAL HYSTERECTOMY N/A 11/15/2014   Procedure: HYSTERECTOMY ABDOMINAL/BILATERAL SALPINGECTOMY ;  Surgeon: Will Bonnet, MD;  Location: [facility] ORS;  Service: Gynecology;  Laterality: N/A;  . CESAREAN SECTION    . CYSTOSCOPY N/A 11/15/2014   Procedure: CYSTOSCOPY;  Surgeon: Will Bonnet, MD;  Location: [facility] ORS;  Service: Gynecology;  Laterality: N/A;  . DILATION AND CURETTAGE OF UTERUS    . GANGLION CYST EXCISION Left   . UTERINE FIBROID SURGERY      There were no vitals filed for this visit.   Subjective Assessment - 01/20/20 1706    Subjective Continued minimal back pain, reporting she has adjusted her chair which has helped. Patient reports she feels 60% better overall. Reports good compliance with HEP.    Pertinent History Patient reports R sided LBP with insidous onset since September 2020. Reports pain is to the R of low back and can travel down RLE and RLE "feels asleep". Pain is aggravated by standing after prolonged sitting, and when she gets up in the morning. Worst pain over  the past week 6/10; best 2/10. 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 Pt denies N/V, B&B changes, unexplained weight fluctuation, saddle paresthesia, fever, night sweats, or unrelenting night pain at this time.    Limitations Sitting;House hold activities;Lifting    How long can you sit comfortably? Less than 83mins    How long can you stand comfortably? unlimited    How long can you walk comfortably? unlimited    Diagnostic tests Xray    Patient Stated Goals decrease pain    Pain Onset 1 to 4 weeks ago             Ther-Ex Nustep seat 3 UE 5 L3 20mins; L4 52min for gentle rotation and strengthening Deadbug with theraball 3x 10 with min cuing for set up with good carry over Alt hip ext in qped with knees lifted 1in from mat 3x 10 with min cuing to prevent CL knee ext and hip lift with good carry over Mini squat with 10# DB swing BUE flex 3x 10 with demo and min cuing for technique with good carry over Hip hinge x10 with good carry over of technique following demo; with bilat 5# 2x 10 with good  carry over of core activation without excessive lumbar ext Seated childs pose on theraball 56min hold min cuing for breath control with good carry over        PT Education - 01/20/20 1711    Education Details therex form/technique    Person(s) Educated Patient    Methods Explanation;Demonstration;Verbal cues    Comprehension Verbalized understanding;Returned demonstration;Verbal cues required            PT Short Term Goals - 01/07/20 0957      PT SHORT TERM GOAL #1   Title Pt will be independent with HEP in order to improve strength and decrease back pain in order to improve pain-free function at home and work.    Baseline 01/07/20 HEP given    Time 4    Period Weeks    Status New             PT Long Term Goals - 01/07/20 1287       PT LONG TERM GOAL #1   Title Pt will decrease worst back pain as reported on NPRS by at least 2 points in order to demonstrate clinically significant reduction in back pain.    Baseline 01/07/20 6/10    Time 6    Period Weeks    Status New      PT LONG TERM GOAL #2   Title Pt will increase R hip gross MMT grade to 5/5 in order to demonstrate improvement in strength and function.    Baseline 01/07/20 hip abd 4/5; add 4+/5; ext 4+/5    Time 6    Period Weeks    Status New      PT LONG TERM GOAL #3   Title Patient will increase FOTO score to 63 to demonstrate predicted increase in functional mobility to complete ADLs    Baseline 01/07/20 47    Time 6    Period Weeks    Status New                 Plan - 01/20/20 1717    Clinical Impression Statement PT continued therex progression for increased core strengthening with success. Patinet is able to comply with cuing for proper technique, with good motivation throughout session and good carry over into functional movement training. PT will continue progression at frequency of 1x/week as able.    Personal Factors and Comorbidities Comorbidity 1;Fitness;Time since onset of injury/illness/exacerbation;Past/Current Experience    Comorbidities HTN    Examination-Activity Limitations Bed Mobility;Sit;Lift    Examination-Participation Restrictions Community Activity;Driving;Cleaning;Occupation    Stability/Clinical Decision Making Evolving/Moderate complexity    Clinical Decision Making Moderate    Rehab Potential Good    PT Frequency 2x / week    PT Duration 8 weeks    PT Treatment/Interventions Aquatic Therapy;Electrical Stimulation;ADLs/Self Care Home Management;Cryotherapy;Iontophoresis 4mg /ml Dexamethasone;Moist Heat;Traction;Ultrasound;Stair training;Gait training;Therapeutic exercise;Passive range of motion;Dry needling;Joint Manipulations;Spinal Manipulations;Patient/family education;Manual techniques;DME Instruction;Therapeutic  activities;Functional mobility training;Neuromuscular re-education    PT Next Visit Plan assess ext based therex, core strengthening, TND?    PT Home Exercise Plan repeated ext, piriformis stretch, seated lateral childs pose    Consulted and Agree with Plan of Care Patient           Patient will benefit from skilled therapeutic intervention in order to improve the following deficits and impairments:  Decreased mobility, Increased muscle spasms, Decreased activity tolerance, Decreased strength, Increased fascial restricitons, Impaired flexibility, Postural dysfunction, Pain, Obesity, Improper body mechanics, Impaired tone, Decreased range of motion  Visit Diagnosis: Chronic  right-sided low back pain with right-sided sciatica  Abnormal posture     Problem List Patient Active Problem List   Diagnosis Date Noted  . CIN III (cervical intraepithelial neoplasia grade III) with severe dysplasia 08/11/2018  . Atypical squamous cell changes of undetermined significance (ASCUS) on cervical cytology with positive high risk human papilloma virus (HPV) 06/29/2018  . Prediabetes 06/24/2018  . Class 1 obesity due to excess calories with serious comorbidity and body mass index (BMI) of 33.0 to 33.9 in adult 05/20/2018  . Essential hypertension 05/20/2018  . Hyperlipidemia 05/20/2018  . Asthma 05/20/2018  . Anxiety in acute stress reaction 05/20/2018  . Status post hysterectomy 11/15/2014   Durwin Reges DPT Durwin Reges 01/20/2020, 5:41 PM  [facility] [address], [place], [address] Phone: [phone]   Fax:  [phone]  Name: KAYCE CHISMAR MRN: [id number] Date of Birth: [birth year]

## 2020-01-24 ENCOUNTER — Ambulatory Visit: Payer: 59 | Admitting: Physical Therapy

## 2020-01-27 ENCOUNTER — Ambulatory Visit: Payer: 59 | Admitting: Physical Therapy

## 2020-01-27 ENCOUNTER — Other Ambulatory Visit: Payer: Self-pay

## 2020-01-27 ENCOUNTER — Encounter: Payer: Self-pay | Admitting: Physical Therapy

## 2020-01-27 DIAGNOSIS — M5441 Lumbago with sciatica, right side: Secondary | ICD-10-CM | POA: Diagnosis not present

## 2020-01-27 DIAGNOSIS — J3089 Other allergic rhinitis: Secondary | ICD-10-CM | POA: Diagnosis not present

## 2020-01-27 DIAGNOSIS — R293 Abnormal posture: Secondary | ICD-10-CM | POA: Diagnosis not present

## 2020-01-27 DIAGNOSIS — G8929 Other chronic pain: Secondary | ICD-10-CM

## 2020-01-27 DIAGNOSIS — J3081 Allergic rhinitis due to animal (cat) (dog) hair and dander: Secondary | ICD-10-CM | POA: Diagnosis not present

## 2020-01-27 DIAGNOSIS — J301 Allergic rhinitis due to pollen: Secondary | ICD-10-CM | POA: Diagnosis not present

## 2020-01-27 NOTE — Therapy (Signed)
[facility] [address] [place], [address] Phone: [phone]   Fax:  [phone]  Physical Therapy Treatment  Patient Details  Name: Tracey Huff MRN: [id number] Date of Birth: 07/28/1970 No data recorded  Encounter Date: 01/27/2020   PT End of Session - 01/27/20 1523    Visit Number 5    Number of Visits 17    Date for PT Re-Evaluation 03/03/20    PT Start Time 0320    PT Stop Time 0400    PT Time Calculation (min) 40 min    Activity Tolerance Patient tolerated treatment well    Behavior During Therapy [facility] for tasks assessed/performed           Past Medical History:  Diagnosis Date  . Allergy   . Asthma   . Fibroid uterus 11/15/2014  . Headache   . Hypertension   . Menorrhagia with irregular cycle 11/15/2014  . Shortness of breath dyspnea     Past Surgical History:  Procedure Laterality Date  . ABDOMINAL HYSTERECTOMY N/A 11/15/2014   Procedure: HYSTERECTOMY ABDOMINAL/BILATERAL SALPINGECTOMY ;  Surgeon: Will Bonnet, MD;  Location: [facility] ORS;  Service: Gynecology;  Laterality: N/A;  . CESAREAN SECTION    . CYSTOSCOPY N/A 11/15/2014   Procedure: CYSTOSCOPY;  Surgeon: Will Bonnet, MD;  Location: [facility] ORS;  Service: Gynecology;  Laterality: N/A;  . DILATION AND CURETTAGE OF UTERUS    . GANGLION CYST EXCISION Left   . UTERINE FIBROID SURGERY      There were no vitals filed for this visit.   Subjective Assessment - 01/27/20 1517    Subjective Patient reports no pain during the day, but that she does have occassional pain when laying on her R side, though she is able to go to sleep. Patient reports she no longer uses biofreeze for pain. Reports compliance with HEP.    Pertinent History Patient reports R sided LBP with insidous onset since September 2020. Reports pain is to the R of low back and can travel down RLE and RLE "feels asleep". Pain is aggravated by standing after prolonged  sitting, and when she gets up in the morning. Worst pain over the past week 6/10; best 2/10. 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 Pt denies N/V, B&B changes, unexplained weight fluctuation, saddle paresthesia, fever, night sweats, or unrelenting night pain at this time.    Limitations Sitting;House hold activities;Lifting    How long can you sit comfortably? Less than 31mins    How long can you stand comfortably? unlimited    How long can you walk comfortably? unlimited    Diagnostic tests Xray    Patient Stated Goals decrease pain    Pain Onset 1 to 4 weeks ago           Ther-Ex Nustep seat 3 UE 5 L4 11min for gentle rotation and strengthening Deadbug with theraball x10;  without ball 2x 10 with min cuing for set up with good carry over Bilat leg lowers 2x 10 with good carry over of demo for technique Supine oblique twists with bilat LE extended 2x 10 good carry over from demo Hip hinge with bilat 5# 3x 10 with continued cuing to prevent heavy ant pelvic tilt/excessive lumbar rotation with decent carry over Palloff antirotation  5# x10 bilat (attempted 10# unable) with demo and max cuing needed for set up posture with good carry over Seated childs pose on theraball 62min hold min cuing for breath control with good carry over          PT Education - 01/27/20 1522    Education Details therex form/technique    Person(s) Educated Patient    Methods Explanation;Demonstration;Verbal cues    Comprehension Verbalized understanding;Returned demonstration;Verbal cues required            PT Short Term Goals - 01/07/20 0957      PT SHORT TERM GOAL #1   Title Pt will be independent with HEP in order to improve strength and decrease back pain in order to improve pain-free function at home and work.    Baseline 01/07/20 HEP given    Time  4    Period Weeks    Status New             PT Long Term Goals - 01/07/20 2993      PT LONG TERM GOAL #1   Title Pt will decrease worst back pain as reported on NPRS by at least 2 points in order to demonstrate clinically significant reduction in back pain.    Baseline 01/07/20 6/10    Time 6    Period Weeks    Status New      PT LONG TERM GOAL #2   Title Pt will increase R hip gross MMT grade to 5/5 in order to demonstrate improvement in strength and function.    Baseline 01/07/20 hip abd 4/5; add 4+/5; ext 4+/5    Time 6    Period Weeks    Status New      PT LONG TERM GOAL #3   Title Patient will increase FOTO score to 63 to demonstrate predicted increase in functional mobility to complete ADLs    Baseline 01/07/20 47    Time 6    Period Weeks    Status New                 Plan - 01/27/20 1547    Clinical Impression Statement PT continued therex progression for increased core strength with multiplanar focus with success. Patient is able to comply with all cuing for proper technique of therex with good motivation and no increased pain throughout session. Patient to decrease frequency to 1x/week with robust HEP (core strengthening, squatting, and weighted walking). PT will continue progression as able.    Personal Factors and Comorbidities Comorbidity 1;Fitness;Time since onset of injury/illness/exacerbation;Past/Current Experience    Comorbidities HTN    Examination-Activity Limitations Bed Mobility;Sit;Lift    Examination-Participation Restrictions Community Activity;Driving;Cleaning;Occupation    Stability/Clinical Decision Making Evolving/Moderate complexity    Clinical Decision Making Moderate    Rehab Potential Good    PT Frequency 2x / week    PT Duration 8 weeks    PT Treatment/Interventions Aquatic Therapy;Electrical Stimulation;ADLs/Self Care Home Management;Cryotherapy;Iontophoresis 4mg /ml Dexamethasone;Moist Heat;Traction;Ultrasound;Stair training;Gait  training;Therapeutic exercise;Passive range of motion;Dry needling;Joint Manipulations;Spinal Manipulations;Patient/family education;Manual techniques;DME Instruction;Therapeutic activities;Functional mobility training;Neuromuscular re-education    PT Next Visit Plan assess ext based therex, core strengthening, TND?    PT Home Exercise Plan repeated ext, piriformis stretch, seated lateral childs pose    Consulted and Agree with Plan of Care Patient           Patient will benefit from skilled therapeutic intervention in order to improve the following deficits and impairments:  Decreased mobility, Increased muscle spasms, Decreased  activity tolerance, Decreased strength, Increased fascial restricitons, Impaired flexibility, Postural dysfunction, Pain, Obesity, Improper body mechanics, Impaired tone, Decreased range of motion  Visit Diagnosis: Chronic right-sided low back pain with right-sided sciatica  Abnormal posture     Problem List Patient Active Problem List   Diagnosis Date Noted  . CIN III (cervical intraepithelial neoplasia grade III) with severe dysplasia 08/11/2018  . Atypical squamous cell changes of undetermined significance (ASCUS) on cervical cytology with positive high risk human papilloma virus (HPV) 06/29/2018  . Prediabetes 06/24/2018  . Class 1 obesity due to excess calories with serious comorbidity and body mass index (BMI) of 33.0 to 33.9 in adult 05/20/2018  . Essential hypertension 05/20/2018  . Hyperlipidemia 05/20/2018  . Asthma 05/20/2018  . Anxiety in acute stress reaction 05/20/2018  . Status post hysterectomy 11/15/2014   Durwin Reges DPT  Durwin Reges 01/27/2020, 4:06 PM  [facility] [address] [place], [address] Phone: [phone]   Fax:  [phone]  Name: Tracey Huff MRN: [id number] Date of Birth: December 29, 1970

## 2020-02-01 ENCOUNTER — Ambulatory Visit: Payer: 59 | Admitting: Physical Therapy

## 2020-02-01 DIAGNOSIS — J301 Allergic rhinitis due to pollen: Secondary | ICD-10-CM | POA: Diagnosis not present

## 2020-02-02 ENCOUNTER — Inpatient Hospital Stay: Payer: 59 | Attending: Obstetrics and Gynecology | Admitting: Obstetrics and Gynecology

## 2020-02-02 ENCOUNTER — Other Ambulatory Visit: Payer: Self-pay

## 2020-02-02 VITALS — BP 134/92 | HR 101 | Temp 98.5°F | Resp 18 | Wt 176.9 lb

## 2020-02-02 DIAGNOSIS — D069 Carcinoma in situ of cervix, unspecified: Secondary | ICD-10-CM | POA: Insufficient documentation

## 2020-02-02 DIAGNOSIS — Z90710 Acquired absence of both cervix and uterus: Secondary | ICD-10-CM | POA: Insufficient documentation

## 2020-02-02 DIAGNOSIS — Z90722 Acquired absence of ovaries, bilateral: Secondary | ICD-10-CM | POA: Diagnosis not present

## 2020-02-02 DIAGNOSIS — Z7951 Long term (current) use of inhaled steroids: Secondary | ICD-10-CM | POA: Diagnosis not present

## 2020-02-02 DIAGNOSIS — F430 Acute stress reaction: Secondary | ICD-10-CM | POA: Insufficient documentation

## 2020-02-02 DIAGNOSIS — E785 Hyperlipidemia, unspecified: Secondary | ICD-10-CM | POA: Diagnosis not present

## 2020-02-02 DIAGNOSIS — J45909 Unspecified asthma, uncomplicated: Secondary | ICD-10-CM | POA: Diagnosis not present

## 2020-02-02 DIAGNOSIS — I10 Essential (primary) hypertension: Secondary | ICD-10-CM | POA: Insufficient documentation

## 2020-02-02 DIAGNOSIS — Z6834 Body mass index (BMI) 34.0-34.9, adult: Secondary | ICD-10-CM | POA: Diagnosis not present

## 2020-02-02 DIAGNOSIS — Z79899 Other long term (current) drug therapy: Secondary | ICD-10-CM | POA: Diagnosis not present

## 2020-02-02 DIAGNOSIS — J3089 Other allergic rhinitis: Secondary | ICD-10-CM | POA: Diagnosis not present

## 2020-02-02 DIAGNOSIS — J3081 Allergic rhinitis due to animal (cat) (dog) hair and dander: Secondary | ICD-10-CM | POA: Diagnosis not present

## 2020-02-02 NOTE — Progress Notes (Signed)
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 P2 female who is seen in consultation from Dr. Glennon Mac for follow up CIN-2/3.   She underwent colposcopy and laser procedure 01/29/2019 at [facility] with Dr. Fransisca Connors.  The posterior vagina was ablated to a depth of just over a millimeter in a broad geographic distribution to cover the entire posterior, apical vagina and cervical remnant.  No bleeding, pain or discharge. She feels well and denies complaints.   Gynecologic Oncology History Tracey Huff is a pleasant P2 female s/p TH/BSx for menorrhagia initially referred to Dr. Glennon Mac from Raelyn Ensign, FNP/[facility] for ASCUS with positive high-risk HPV. Her history is as follows:  She underwent total abdominal hysterectomy (11/15/2014 with Dr. Glennon Mac) with bilateral salpingectomy and cystoscopy for menorrhagia with regular cycles and fibroid uterus.   Final pathology from 11/15/2014 showed  A. 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  Her 6-week postop exam indicated no cervix.  07/03/18 ASCUS with positive high-risk HPV- Pap smear result showed endocervical/transformation zone present. HIV nonreactive. RPR nonreactive. Gonorrhea/Chlamydia- negative.   Colposcopy was performed on 07/28/2018 Pathology: 1.  Cervix, biopsy, 5:00: -High-grade squamous intraepithelial lesion (CIN-2-3, high-grade dysplasia) 2.  Cervix, biopsy, 8:00 -Low-grade squamous intraepithelial lesion (CIN-1, low-grade dysplasia) 3.   Endocervix, curettage -Low-grade squamous intraepithelial lesion (CIN-1, low-grade dysplasia)  Per Dr. Glennon Mac, suspicion of cervical remnant.    Ultrasound on 12/17/2018-  Adnexa: No masses noted.  Only one ovary definitively noted in the midline measuring 2.9 x 1.9 x 1.7 cm.  No free fluid in the cul-de-sac. Uterus: Surgically absent Additional: No definitive cervix noted.  Hyperechoic density noted between the vaginal wall and bladder of uncertain significance.  It appears well-circumscribed.  No bleeding or discharge.  She was referred to Gynecologic Oncology and saw Dr. Fransisca Connors on 12/30/2018. Colposcopy of cervix remnant done and shows small area of faint AWE without vascular changes at 6 o'clock. Discussed options for management including excision or destruction of CIN including vaginal trachelectomy and laser ablation of the CIN and then follow up PAPs.  If subsequently dysplasia is persistent can do an excisional procedure. She opted for outpatient laser ablation.  Problem List: Patient Active Problem List   Diagnosis Date Noted  . CIN III (cervical intraepithelial neoplasia grade III) with severe dysplasia 08/11/2018  . Atypical squamous cell changes of undetermined significance (ASCUS) on cervical cytology with positive high risk human papilloma virus (HPV) 06/29/2018  . Prediabetes 06/24/2018  . Class 1 obesity due to excess calories with serious comorbidity and body mass index (BMI) of 33.0 to 33.9 in adult 05/20/2018  . Essential hypertension 05/20/2018  . Hyperlipidemia 05/20/2018  . Asthma 05/20/2018  . Anxiety in acute stress reaction 05/20/2018  . Status post hysterectomy 11/15/2014    Past Medical History: Past Medical History:  Diagnosis Date  . Allergy   . Asthma   . Fibroid uterus 11/15/2014  . Headache   . Hypertension   . Menorrhagia with irregular cycle 11/15/2014  . Shortness of breath dyspnea     Past Surgical History: Past Surgical History:  Procedure  Laterality Date  . ABDOMINAL HYSTERECTOMY N/A 11/15/2014   Procedure: HYSTERECTOMY ABDOMINAL/BILATERAL SALPINGECTOMY ;  Surgeon: Will Bonnet, MD;  Location: [facility] ORS;  Service: Gynecology;  Laterality: N/A;  . CESAREAN SECTION    . CYSTOSCOPY N/A 11/15/2014   Procedure: CYSTOSCOPY;  Surgeon: Will Bonnet, MD;  Location: [facility] ORS;  Service: Gynecology;  Laterality: N/A;  . DILATION AND CURETTAGE OF UTERUS    . GANGLION CYST EXCISION Left   . UTERINE FIBROID SURGERY     OB History:  OB History  No obstetric history on file.   Family History: Family History  Problem Relation Age of Onset  . Congestive Heart Failure Mother   . Diabetes Mother   . Hypertension Mother   . Diabetes Maternal Grandmother   . Congestive Heart Failure Maternal Grandfather    Social History: Social History   Socioeconomic History  . Marital status: Married    Spouse name: Fritz Pickerel  . Number of children: 2  . Years of education: Not on file  . Highest education level: Not on file  Occupational History  . Not on file  Tobacco Use  . Smoking status: Never Smoker  . Smokeless tobacco: Never Used  Vaping Use  . Vaping Use: Never used  Substance and Sexual Activity  . Alcohol use: Not Currently    Comment: occ  . Drug use: No  . Sexual activity: Yes    Partners: Male    Birth control/protection: Surgical  Other Topics Concern  . Not on file  Social History Narrative  . Not on file   Social Determinants of Health   Financial Resource Strain:   . Difficulty of Paying Living Expenses: Not on file  Food Insecurity:   . Worried About Charity fundraiser in the Last Year: Not on file  . Ran Out of Food in the Last Year: Not on file  Transportation Needs:   . Lack of Transportation (Medical): Not on file  . Lack of Transportation (Non-Medical): Not on file  Physical Activity:   . Days of Exercise per Week: Not on file  . Minutes of Exercise per Session: Not on file  Stress:   . Feeling  of Stress : Not on file  Social Connections:   . Frequency of Communication with Friends and Family: Not on file  . Frequency of Social Gatherings with Friends and Family: Not on file  . Attends Religious Services: Not on file  . Active Member of Clubs or Organizations: Not on file  . Attends Archivist Meetings: Not on file  . Marital Status: Not on file  Intimate Partner Violence:   . Fear of Current or Ex-Partner: Not on file  . Emotionally Abused: Not on file  . Physically Abused: Not on file  . Sexually Abused: Not on file   Allergies: Allergies  Allergen Reactions  . Macadamia Nut Oil Shortness Of Breath  . Apple Nausea And Vomiting  . Carrot Oil     Can not cooked raw carrots  . Fruit & Vegetable Daily [Nutritional Supplements] Nausea And Vomiting    Cannot tolerate apples, peaches, plums, and nectarines  . Kiwi Extract Nausea And Vomiting  . Peach [Prunus Persica] Nausea And Vomiting   Current Medications: Current Outpatient Medications  Medication Sig Dispense Refill  . albuterol (PROVENTIL HFA;VENTOLIN HFA) 108 (90 BASE) MCG/ACT inhaler Inhale 1-2 puffs into the lungs every 6 (six) hours as needed for wheezing or shortness of breath.    Marland Kitchen  Ascorbic Acid (VITAMIN C) 100 MG tablet Take 100 mg by mouth daily.    Marland Kitchen atorvastatin (LIPITOR) 20 MG tablet Take 1 tablet (20 mg total) by mouth at bedtime. 90 tablet 3  . budesonide-formoterol (SYMBICORT) 160-4.5 MCG/ACT inhaler Inhale into the lungs.    . busPIRone (BUSPAR) 7.5 MG tablet Take 1 tablet once daily at night; may take 1 additional tablet daily if needed for anxiety. 180 tablet 1  . Cholecalciferol 25 MCG (1000 UT) capsule Take by mouth.    . ELDERBERRY PO Take by mouth.    . EPINEPHrine 0.15 MG/0.15ML IJ injection Inject into the skin. (Patient not taking: Reported on 12/27/2019)    . hydrOXYzine (ATARAX/VISTARIL) 10 MG tablet Take 1-2 tablets (10-20 mg total) by mouth 3 (three) times daily as needed for  itching (rash). 60 tablet 0  . levocetirizine (XYZAL) 5 MG tablet Take 5 mg by mouth every evening.    Marland Kitchen losartan (COZAAR) 25 MG tablet TAKE 1/2 TABLET BY MOUTH DAILY. 45 tablet 3  . montelukast (SINGULAIR) 10 MG tablet Take by mouth.    . Multiple Vitamin (MULTI-VITAMIN DAILY PO) Take by mouth.    Marland Kitchen tiZANidine (ZANAFLEX) 4 MG tablet Take 0.5-1.5 tablets (2-6 mg total) by mouth every 8 (eight) hours as needed for muscle spasms. 30 tablet 0   No current facility-administered medications for this visit.   Review of Systems General:  no complaints Skin: no complaints Eyes: no complaints HEENT: no complaints Breasts: no complaints Pulmonary: no complaints Cardiac: no complaints Gastrointestinal: no complaints Genitourinary/Sexual: no complaints Ob/Gyn: no complaints Musculoskeletal: no complaints Hematology: no complaints Neurologic/Psych: no complaints  Objective:  Physical Examination:  There were no vitals filed for this visit. Body mass index is 34.55 kg/m. LMP 11/15/2014     ECOG Performance Status: 0 - Asymptomatic  GENERAL: Patient is a well appearing female in no acute distress HEENT:  Sclera clear. Anicteric NODES:  Negative axillary, supraclavicular, inguinal lymph node survery LUNGS:  Clear to auscultation bilaterally.   HEART:  Regular rate and rhythm.  ABDOMEN:  Soft, nontender.  No hernias, incisions well healed. No masses or ascites EXTREMITIES:  No peripheral edema. Atraumatic. No cyanosis SKIN:  Clear with no obvious rashes or skin changes.  NEURO:  Nonfocal. Well oriented.  Appropriate affect.  Pelvic: Chaperoned by Nursing EGBUS: no lesions Cervix: no obvious residual cervix or os. No lesions on visual exam. On palpation there is a small nubbin of residual cervix measuring ~ 1 cm Vagina: no lesions, no discharge or bleeding Uterus: surgically absent Adnexa: no palpable masses Rectovaginal: deferred  Assessment:  Tracey Huff is a 49 y.o. female  s/p prior TH/BSx for fibroids diagnosed with cervical remnant and CIN2-3 on colposcopy/biopsy.  01/29/2019 laser ablation of the posterior vagina, apical vagina and cervical remnant. Exam negative today. No PAP done since ablation.   Medical co-morbidities complicating care: HTN.  Plan:   Problem List Items Addressed This Visit      Genitourinary   CIN III (cervical intraepithelial neoplasia grade III) with severe dysplasia - Primary     Repeat Pap/HPV done today.  If normal will follow up with Dr Donneta Romberg in 6 months.   [name] diagnosis, an outline of the further diagnostic and laboratory studies which will be required, the recommendation for surgery, and alternatives were discussed with her and her accompanying family members.  All questions were answered to their satisfaction.  Verlon Au, NP  I personally interviewed and  examined the patient. Agreed with the above/below plan of care. I have directly contributed to assessment and plan of care of this patient and educated and discussed with patient and family.  Mellody Drown, MD   CC:  Referring Provider: Dr Glennon Mac

## 2020-02-02 NOTE — Patient Instructions (Signed)
If pap is normal you can follow up with Dr. Glennon Mac in 6 months (around March 2022). It may take 10 days for pap to return and we will call you with results.

## 2020-02-03 ENCOUNTER — Ambulatory Visit: Payer: 59

## 2020-02-03 DIAGNOSIS — G8929 Other chronic pain: Secondary | ICD-10-CM | POA: Diagnosis not present

## 2020-02-03 DIAGNOSIS — R293 Abnormal posture: Secondary | ICD-10-CM

## 2020-02-03 DIAGNOSIS — J3089 Other allergic rhinitis: Secondary | ICD-10-CM | POA: Diagnosis not present

## 2020-02-03 DIAGNOSIS — J3081 Allergic rhinitis due to animal (cat) (dog) hair and dander: Secondary | ICD-10-CM | POA: Diagnosis not present

## 2020-02-03 DIAGNOSIS — J301 Allergic rhinitis due to pollen: Secondary | ICD-10-CM | POA: Diagnosis not present

## 2020-02-03 DIAGNOSIS — M5441 Lumbago with sciatica, right side: Secondary | ICD-10-CM | POA: Diagnosis not present

## 2020-02-03 NOTE — Therapy (Signed)
[facility] [address] [place], [address] Phone: [phone]   Fax:  [phone]  Physical Therapy Treatment  Patient Details  Name: Tracey Huff MRN: [id number] Date of Birth: [birth year] No data recorded  Encounter Date: 02/03/2020   PT End of Session - 02/03/20 1739    Visit Number 6    Number of Visits 17    Date for PT Re-Evaluation 03/03/20    PT Start Time 8101    PT Stop Time 7510    PT Time Calculation (min) 40 min    Activity Tolerance Patient tolerated treatment well    Behavior During Therapy [facility] for tasks assessed/performed           Past Medical History:  Diagnosis Date   Allergy    Asthma    Fibroid uterus 11/15/2014   Headache    Hypertension    Menorrhagia with irregular cycle 11/15/2014   Shortness of breath dyspnea     Past Surgical History:  Procedure Laterality Date   ABDOMINAL HYSTERECTOMY N/A 11/15/2014   Procedure: HYSTERECTOMY ABDOMINAL/BILATERAL SALPINGECTOMY ;  Surgeon: Will Bonnet, MD;  Location: [facility] ORS;  Service: Gynecology;  Laterality: N/A;   CESAREAN SECTION     CYSTOSCOPY N/A 11/15/2014   Procedure: CYSTOSCOPY;  Surgeon: Will Bonnet, MD;  Location: [facility] ORS;  Service: Gynecology;  Laterality: N/A;   DILATION AND CURETTAGE OF UTERUS     GANGLION CYST EXCISION Left    UTERINE FIBROID SURGERY      There were no vitals filed for this visit.   Subjective Assessment - 02/03/20 1737    Subjective Pt not having a good day today, pain increased from Right back to poster right mid thigh. Pt had trouble with sleep last night. Pt report HEP does continue to help her pain.    Pertinent History Patient reports R sided LBP with insidous onset since September 2020. Reports pain is to the R of low back and can travel down RLE and RLE "feels asleep". Pain is aggravated by standing after prolonged sitting, and when she gets up in the morning. Worst  pain over the past week 6/10; best 2/10. 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 Pt denies N/V, B&B changes, unexplained weight fluctuation, saddle paresthesia, fever, night sweats, or unrelenting night pain at this time.    Currently in Pain? Yes    Pain Score 7     Pain Location Back    Pain Orientation Right           INTERVENTION THIS DATE:  -NuStep Seat 3, Arms 5, 3 minutes at level 1, 2 minutes at level 3 (gentle AA/ROM of hips and lumbar spine  -Seated Physioball rollouts lumbar stretch 10x10secH (approaches neutral curvature at L3-S1, but maintains slight lordosis at T12-L3) -Supine SKTC stretch 3x30sec bilat -DKTC stretch 2x30sec  -Reverse curl (end range only to avoid lordosis) 2x15x3secH  -Supine Fig 4 stretch 2x30sec bilat -STS from chair 1x10 (added balance pad to chair for last 5 due to worsening back pain  -     PT Short Term Goals - 01/07/20 0957      PT SHORT TERM GOAL #1   Title Pt will be independent with HEP in order to improve strength and decrease back pain in  order to improve pain-free function at home and work.    Baseline 01/07/20 HEP given    Time 4    Period Weeks    Status New             PT Long Term Goals - 01/07/20 7846      PT LONG TERM GOAL #1   Title Pt will decrease worst back pain as reported on NPRS by at least 2 points in order to demonstrate clinically significant reduction in back pain.    Baseline 01/07/20 6/10    Time 6    Period Weeks    Status New      PT LONG TERM GOAL #2   Title Pt will increase R hip gross MMT grade to 5/5 in order to demonstrate improvement in strength and function.    Baseline 01/07/20 hip abd 4/5; add 4+/5; ext 4+/5    Time 6    Period Weeks    Status New      PT LONG TERM GOAL #3   Title Patient will increase FOTO score to 63 to demonstrate predicted  increase in functional mobility to complete ADLs    Baseline 01/07/20 47    Time 6    Period Weeks    Status New                 Plan - 02/03/20 1740    Clinical Impression Statement Continued with current POC for patient. Pt conitnues to demonstrate gradual progress toward goals, although pt is in pain exacerbation this date. Pt remains stiff and hypomobile in lumbar spine. Pt provided with verbal and tactile cues intermittently to educate on best potential technique with each activity. MinGuard to minA provided PRN for safety. No updates made to HEP at this time. Session ended without any exacerbation of pain, however accomodation were requires to prevent aggravation with certain exercises.     Personal Factors and Comorbidities Comorbidity 1;Fitness;Time since onset of injury/illness/exacerbation;Past/Current Experience    Comorbidities HTN    Examination-Activity Limitations Bed Mobility;Sit;Lift    Examination-Participation Restrictions Community Activity;Driving;Cleaning;Occupation    Stability/Clinical Decision Making Evolving/Moderate complexity    Clinical Decision Making Moderate    Rehab Potential Good    PT Frequency 2x / week    PT Duration 8 weeks    PT Treatment/Interventions Aquatic Therapy;Electrical Stimulation;ADLs/Self Care Home Management;Cryotherapy;Iontophoresis 4mg /ml Dexamethasone;Moist Heat;Traction;Ultrasound;Stair training;Gait training;Therapeutic exercise;Passive range of motion;Dry needling;Joint Manipulations;Spinal Manipulations;Patient/family education;Manual techniques;DME Instruction;Therapeutic activities;Functional mobility training;Neuromuscular re-education    PT Next Visit Plan assess ext based therex, core strengthening, TND?    PT Home Exercise Plan repeated ext, piriformis stretch, seated lateral childs pose    Consulted and Agree with Plan of Care Patient           Patient will benefit from skilled therapeutic intervention in order to  improve the following deficits and impairments:  Decreased mobility, Increased muscle spasms, Decreased activity tolerance, Decreased strength, Increased fascial restricitons, Impaired flexibility, Postural dysfunction, Pain, Obesity, Improper body mechanics, Impaired tone, Decreased range of motion  Visit Diagnosis: Chronic right-sided low back pain with right-sided sciatica  Abnormal posture     Problem List Patient Active Problem List   Diagnosis Date Noted   CIN III (cervical intraepithelial neoplasia grade III) with severe dysplasia 08/11/2018   Atypical squamous cell changes of undetermined significance (ASCUS) on cervical cytology with positive high risk human papilloma virus (HPV) 06/29/2018   Prediabetes 06/24/2018   Class 1 obesity due to excess calories with serious  comorbidity and body mass index (BMI) of 33.0 to 33.9 in adult 05/20/2018   Essential hypertension 05/20/2018   Hyperlipidemia 05/20/2018   Asthma 05/20/2018   Anxiety in acute stress reaction 05/20/2018   Status post hysterectomy 11/15/2014   5:57 PM, 02/03/20 Etta Grandchild, PT, DPT Physical Therapist - [place] [phone] (Office)   Jancarlo Biermann C 02/03/2020, 5:44 PM  [facility] [address] [place], [address] Phone: [phone]   Fax:  [phone]  Name: LURENA NAEVE MRN: [id number] Date of Birth: 10-Aug-1970

## 2020-02-08 ENCOUNTER — Ambulatory Visit: Payer: 59 | Admitting: Physical Therapy

## 2020-02-08 LAB — IGP, APTIMA HPV: HPV Aptima: NEGATIVE

## 2020-02-09 ENCOUNTER — Telehealth: Payer: Self-pay | Admitting: Nurse Practitioner

## 2020-02-09 DIAGNOSIS — D069 Carcinoma in situ of cervix, unspecified: Secondary | ICD-10-CM

## 2020-02-09 NOTE — Progress Notes (Signed)
Called and provided negative pap smear results. Follow up with Dr. Glennon Mac in 6 months (07/2020)

## 2020-02-09 NOTE — Telephone Encounter (Signed)
Called patient with results of pap which was normal. Advised her to schedule follow up with Dr. Glennon Mac in 6 months (around April 2022).

## 2020-02-11 ENCOUNTER — Encounter: Payer: Self-pay | Admitting: Physical Therapy

## 2020-02-11 ENCOUNTER — Ambulatory Visit: Payer: 59 | Attending: Family Medicine | Admitting: Physical Therapy

## 2020-02-11 ENCOUNTER — Other Ambulatory Visit: Payer: Self-pay

## 2020-02-11 DIAGNOSIS — R293 Abnormal posture: Secondary | ICD-10-CM | POA: Diagnosis not present

## 2020-02-11 DIAGNOSIS — M5441 Lumbago with sciatica, right side: Secondary | ICD-10-CM | POA: Diagnosis not present

## 2020-02-11 DIAGNOSIS — G8929 Other chronic pain: Secondary | ICD-10-CM | POA: Insufficient documentation

## 2020-02-11 NOTE — Therapy (Signed)
[facility] [address] [place], [address] Phone: [phone]   Fax:  [phone]  Physical Therapy Treatment  Patient Details  Name: Tracey Huff MRN: [id number] Date of Birth: 1970/08/20 No data recorded  Encounter Date: 02/11/2020   PT End of Session - 02/11/20 1103    Visit Number 7    Number of Visits 17    Date for PT Re-Evaluation 03/03/20    PT Start Time 0830    PT Stop Time 0915    PT Time Calculation (min) 45 min    Activity Tolerance Patient tolerated treatment well           Past Medical History:  Diagnosis Date  . Allergy   . Asthma   . Fibroid uterus 11/15/2014  . Headache   . Hypertension   . Menorrhagia with irregular cycle 11/15/2014  . Shortness of breath dyspnea     Past Surgical History:  Procedure Laterality Date  . ABDOMINAL HYSTERECTOMY N/A 11/15/2014   Procedure: HYSTERECTOMY ABDOMINAL/BILATERAL SALPINGECTOMY ;  Surgeon: Will Bonnet, MD;  Location: [facility]S;  Service: Gynecology;  Laterality: N/A;  . CESAREAN SECTION    . CYSTOSCOPY N/A 11/15/2014   Procedure: CYSTOSCOPY;  Surgeon: Will Bonnet, MD;  Location: [facility] ORS;  Service: Gynecology;  Laterality: N/A;  . DILATION AND CURETTAGE OF UTERUS    . GANGLION CYST EXCISION Left   . UTERINE FIBROID SURGERY      There were no vitals filed for this visit.   Subjective Assessment - 02/11/20 1101    Subjective Pt denies pain upon arrival today.  She notes overall improvement in her S/S.    Pertinent History Patient reports R sided LBP with insidous onset since September 2020. Reports pain is to the R of low back and can travel down RLE and RLE "feels asleep". Pain is aggravated by standing after prolonged sitting, and when she gets up in the morning. Worst pain over the past week 6/10; best 2/10. 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 Pt denies N/V, B&B changes, unexplained weight fluctuation, saddle paresthesia, fever, night sweats, or unrelenting night pain at this time.    Limitations Sitting;House hold activities;Lifting    How long can you sit comfortably? Less than 31mins    How long can you stand comfortably? unlimited    How long can you walk comfortably? unlimited    Diagnostic tests Xray    Patient Stated Goals decrease pain    Currently in Pain? No/denies            Treatment:  NuStep Seat 3, Arms 5, 3 minutes at level 1, 2 minutes at level 3 (gentle AA/ROM of hips and lumbar spine  -Seated Physioball rollouts lumbar stretch 10x10sec -Supine SKTC stretch 3x30sec bilat -DKTC stretch 2x30sec  -HS stretch with strap B 3x30 sec -Supine Fig 4 stretch 2x30sec bilat -LTR knee flop stretches 10x10 sec bilat -Bridging with ball squeeze and TA squeeze 2x10 -hooklying clams with RTB and TA squeeze 2x10 -seated marching on [employer] with TA squeeze 2x10 -Bird dogs over green Swiss ball with focus on tight core musculature 2x10  PT Education - 02/11/20 1102    Education Details therex form/technique    Person(s) Educated Patient    Methods Demonstration;Verbal cues    Comprehension Verbalized understanding;Returned demonstration            PT Short Term Goals - 01/07/20 0957      PT SHORT TERM GOAL #1   Title Pt will be independent with HEP in order to improve strength and decrease back pain in order to improve pain-free function at home and work.    Baseline 01/07/20 HEP given    Time 4    Period Weeks    Status New             PT Long Term Goals - 01/07/20 2952      PT LONG TERM GOAL #1   Title Pt will decrease worst back pain as reported on NPRS by at least 2 points in order to demonstrate clinically significant reduction in back pain.    Baseline 01/07/20 6/10    Time 6     Period Weeks    Status New      PT LONG TERM GOAL #2   Title Pt will increase R hip gross MMT grade to 5/5 in order to demonstrate improvement in strength and function.    Baseline 01/07/20 hip abd 4/5; add 4+/5; ext 4+/5    Time 6    Period Weeks    Status New      PT LONG TERM GOAL #3   Title Patient will increase FOTO score to 63 to demonstrate predicted increase in functional mobility to complete ADLs    Baseline 01/07/20 47    Time 6    Period Weeks    Status New                 Plan - 02/11/20 1103    Clinical Impression Statement Used verbal cueing today to guide pt to stay out of painful range with ther ex's to avoid exacerbation of S/S.  Pt tolerated all advanced core/lumbar stab ex's today without problems.  Pt has Swiss ball at home.  Advised to do seated march on ball with TA squeeze and bird dogs over Swiss ball at home.    Personal Factors and Comorbidities Comorbidity 1;Fitness;Time since onset of injury/illness/exacerbation;Past/Current Experience    Comorbidities HTN    Examination-Activity Limitations Bed Mobility;Sit;Lift    Examination-Participation Restrictions Community Activity;Driving;Cleaning;Occupation    Stability/Clinical Decision Making Evolving/Moderate complexity    Clinical Decision Making Moderate    Rehab Potential Good    PT Frequency 2x / week    PT Duration 8 weeks    PT Treatment/Interventions Aquatic Therapy;Electrical Stimulation;ADLs/Self Care Home Management;Cryotherapy;Iontophoresis 4mg /ml Dexamethasone;Moist Heat;Traction;Ultrasound;Stair training;Gait training;Therapeutic exercise;Passive range of motion;Dry needling;Joint Manipulations;Spinal Manipulations;Patient/family education;Manual techniques;DME Instruction;Therapeutic activities;Functional mobility training;Neuromuscular re-education    PT Next Visit Plan assess ext based therex, core strengthening, TND?    PT Home Exercise Plan repeated ext, piriformis stretch, seated  lateral childs pose    Consulted and Agree with Plan of Care Patient           Patient will benefit from skilled therapeutic intervention in order to improve the following deficits and impairments:  Decreased mobility, Increased muscle spasms, Decreased activity tolerance, Decreased strength, Increased fascial restricitons, Impaired flexibility, Postural dysfunction, Pain, Obesity, Improper body mechanics, Impaired tone, Decreased range of motion  Visit Diagnosis: Chronic right-sided low back pain with right-sided sciatica  Abnormal posture     Problem List Patient Active Problem  List   Diagnosis Date Noted  . CIN III (cervical intraepithelial neoplasia grade III) with severe dysplasia 08/11/2018  . Atypical squamous cell changes of undetermined significance (ASCUS) on cervical cytology with positive high risk human papilloma virus (HPV) 06/29/2018  . Prediabetes 06/24/2018  . Class 1 obesity due to excess calories with serious comorbidity and body mass index (BMI) of 33.0 to 33.9 in adult 05/20/2018  . Essential hypertension 05/20/2018  . Hyperlipidemia 05/20/2018  . Asthma 05/20/2018  . Anxiety in acute stress reaction 05/20/2018  . Status post hysterectomy 11/15/2014    Silviano Neuser, MPT 02/11/2020, 11:07 AM  [facility] [address] [place], [address] Phone: [phone]   Fax:  [phone]  Name: Tracey Huff MRN: [id number] Date of Birth: [birth year]

## 2020-02-15 ENCOUNTER — Encounter: Payer: 59 | Admitting: Physical Therapy

## 2020-02-17 DIAGNOSIS — J3081 Allergic rhinitis due to animal (cat) (dog) hair and dander: Secondary | ICD-10-CM | POA: Diagnosis not present

## 2020-02-17 DIAGNOSIS — J301 Allergic rhinitis due to pollen: Secondary | ICD-10-CM | POA: Diagnosis not present

## 2020-02-17 DIAGNOSIS — J3089 Other allergic rhinitis: Secondary | ICD-10-CM | POA: Diagnosis not present

## 2020-02-18 ENCOUNTER — Other Ambulatory Visit: Payer: Self-pay

## 2020-02-18 ENCOUNTER — Encounter: Payer: Self-pay | Admitting: Physical Therapy

## 2020-02-18 ENCOUNTER — Ambulatory Visit: Payer: 59 | Admitting: Physical Therapy

## 2020-02-18 DIAGNOSIS — G8929 Other chronic pain: Secondary | ICD-10-CM

## 2020-02-18 DIAGNOSIS — M5441 Lumbago with sciatica, right side: Secondary | ICD-10-CM | POA: Diagnosis not present

## 2020-02-18 DIAGNOSIS — R293 Abnormal posture: Secondary | ICD-10-CM

## 2020-02-18 NOTE — Therapy (Signed)
[facility] [address], [place], [address] Phone: [phone]   Fax:  [phone]  Physical Therapy Treatment  Patient Details  Name: Tracey Huff MRN: [id number] Date of Birth: 07/01/1970 No data recorded  Encounter Date: 02/18/2020   PT End of Session - 02/18/20 0843    Visit Number 8    Number of Visits 17    Date for PT Re-Evaluation 03/03/20    PT Start Time 0835    PT Stop Time 0913    PT Time Calculation (min) 38 min    Activity Tolerance Patient tolerated treatment well    Behavior During Therapy [facility] for tasks assessed/performed           Past Medical History:  Diagnosis Date   Allergy    Asthma    Fibroid uterus 11/15/2014   Headache    Hypertension    Menorrhagia with irregular cycle 11/15/2014   Shortness of breath dyspnea     Past Surgical History:  Procedure Laterality Date   ABDOMINAL HYSTERECTOMY N/A 11/15/2014   Procedure: HYSTERECTOMY ABDOMINAL/BILATERAL SALPINGECTOMY ;  Surgeon: Will Bonnet, MD;  Location: [facility] ORS;  Service: Gynecology;  Laterality: N/A;   CESAREAN SECTION     CYSTOSCOPY N/A 11/15/2014   Procedure: CYSTOSCOPY;  Surgeon: Will Bonnet, MD;  Location: [facility] OR[facility];  Service: Gynecology;  Laterality: N/A;   DILATION AND CURETTAGE OF UTERUS     GANGLION CYST EXCISION Left    UTERINE FIBROID SURGERY      There were no vitals filed for this visit.   Subjective Assessment - 02/18/20 0841    Subjective Pt reports she is doing well, just pain when trying to fall asleep that subsides quickly. She is getting a new chair at work, which she thinks will be helpful.    Pertinent History Patient reports R sided LBP with insidous onset since September 2020. Reports pain is to the R of low back and can travel down RLE and RLE "feels asleep". Pain is aggravated by standing after prolonged sitting, and when she gets up in the morning. Worst pain over the  past week 6/10; best 2/10. 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 Pt denies N/V, B&B changes, unexplained weight fluctuation, saddle paresthesia, fever, night sweats, or unrelenting night pain at this time.    Limitations Sitting;House hold activities;Lifting    How long can you sit comfortably? Less than 59mins    How long can you stand comfortably? unlimited    How long can you walk comfortably? unlimited    Diagnostic tests Xray    Patient Stated Goals decrease pain    Pain Onset 1 to 4 weeks ago           Ther-Ex NuStep Seat 3, Arms 5, 3 minutes at level 1, 2 minutes at level 3 (gentle AA/ROM of hips and lumbar spine  - Supine double leg lowers 2x 10 with min cuing to maintain spine contact to mat table with good carry over - Supine windshield wipers (oblique twists) 2x 10 with cuing for oblique activation with good carry over - Hip hinge x12 with good carry over of demo and cuing to maintain core activation to reduce ant pelvic tilt at full stand; with 6# x10 Sidestepping GTB L and R  74ft; with 6# 2x 35ft bilat with good carry over following cuing for set up -Seated Physioball rollouts lumbar stretch 10x10sec                  PT Education - 02/18/20 0842    Education Details therex form/technique    Person(s) Educated Patient    Methods Explanation;Demonstration;Verbal cues    Comprehension Verbalized understanding;Returned demonstration;Verbal cues required            PT Short Term Goals - 01/07/20 0957      PT SHORT TERM GOAL #1   Title Pt will be independent with HEP in order to improve strength and decrease back pain in order to improve pain-free function at home and work.    Baseline 01/07/20 HEP given    Time 4    Period Weeks    Status New             PT Long Term Goals - 01/07/20 8127      PT  LONG TERM GOAL #1   Title Pt will decrease worst back pain as reported on NPRS by at least 2 points in order to demonstrate clinically significant reduction in back pain.    Baseline 01/07/20 6/10    Time 6    Period Weeks    Status New      PT LONG TERM GOAL #2   Title Pt will increase R hip gross MMT grade to 5/5 in order to demonstrate improvement in strength and function.    Baseline 01/07/20 hip abd 4/5; add 4+/5; ext 4+/5    Time 6    Period Weeks    Status New      PT LONG TERM GOAL #3   Title Patient will increase FOTO score to 63 to demonstrate predicted increase in functional mobility to complete ADLs    Baseline 01/07/20 47    Time 6    Period Weeks    Status New                 Plan - 02/18/20 0916    Clinical Impression Statement PT continued therex progression for increased hip and core strength and stability with success. Patient is able to comply with all cuing for proper technique of therex with good motivation, and no increased pain throughout session. PT will continue progression as able.    Personal Factors and Comorbidities Comorbidity 1;Fitness;Time since onset of injury/illness/exacerbation;Past/Current Experience    Examination-Activity Limitations Bed Mobility;Sit;Lift    Examination-Participation Restrictions Community Activity;Driving;Cleaning;Occupation    Stability/Clinical Decision Making Evolving/Moderate complexity    Clinical Decision Making Moderate    Rehab Potential Good    PT Frequency 2x / week    PT Duration 8 weeks    PT Treatment/Interventions Aquatic Therapy;Electrical Stimulation;ADLs/Self Care Home Management;Cryotherapy;Iontophoresis 4mg /ml Dexamethasone;Moist Heat;Traction;Ultrasound;Stair training;Gait training;Therapeutic exercise;Passive range of motion;Dry needling;Joint Manipulations;Spinal Manipulations;Patient/family education;Manual techniques;DME Instruction;Therapeutic activities;Functional mobility training;Neuromuscular  re-education    PT Next Visit Plan assess ext based therex, core strengthening, TND?    PT Home Exercise Plan repeated ext, piriformis stretch, seated lateral childs pose    Consulted and Agree with Plan of Care Patient           Patient will benefit from skilled therapeutic intervention in order to improve the following deficits and impairments:  Decreased mobility, Increased muscle spasms, Decreased activity tolerance, Decreased strength, Increased fascial restricitons, Impaired flexibility, Postural dysfunction, Pain, Obesity, Improper body mechanics, Impaired tone, Decreased range of motion  Visit Diagnosis: Chronic  right-sided low back pain with right-sided sciatica  Abnormal posture     Problem List Patient Active Problem List   Diagnosis Date Noted   CIN III (cervical intraepithelial neoplasia grade III) with severe dysplasia 08/11/2018   Atypical squamous cell changes of undetermined significance (ASCUS) on cervical cytology with positive high risk human papilloma virus (HPV) 06/29/2018   Prediabetes 06/24/2018   Class 1 obesity due to excess calories with serious comorbidity and body mass index (BMI) of 33.0 to 33.9 in adult 05/20/2018   Essential hypertension 05/20/2018   Hyperlipidemia 05/20/2018   Asthma 05/20/2018   Anxiety in acute stress reaction 05/20/2018   Status post hysterectomy 11/15/2014   Durwin Reges DPT Durwin Reges 02/18/2020, 9:31 AM  [facility] [address], [place], [address] Phone: [phone]   Fax:  [phone]  Name: RYONNA CIMINI MRN: [id number] Date of Birth: [birth year]

## 2020-02-22 ENCOUNTER — Ambulatory Visit: Payer: 59 | Admitting: Physical Therapy

## 2020-02-25 ENCOUNTER — Ambulatory Visit: Payer: 59 | Admitting: Physical Therapy

## 2020-02-25 ENCOUNTER — Other Ambulatory Visit: Payer: Self-pay

## 2020-02-25 ENCOUNTER — Encounter: Payer: Self-pay | Admitting: Physical Therapy

## 2020-02-25 DIAGNOSIS — G8929 Other chronic pain: Secondary | ICD-10-CM | POA: Diagnosis not present

## 2020-02-25 DIAGNOSIS — J3089 Other allergic rhinitis: Secondary | ICD-10-CM | POA: Diagnosis not present

## 2020-02-25 DIAGNOSIS — J301 Allergic rhinitis due to pollen: Secondary | ICD-10-CM | POA: Diagnosis not present

## 2020-02-25 DIAGNOSIS — M5441 Lumbago with sciatica, right side: Secondary | ICD-10-CM | POA: Diagnosis not present

## 2020-02-25 DIAGNOSIS — R293 Abnormal posture: Secondary | ICD-10-CM | POA: Diagnosis not present

## 2020-02-25 DIAGNOSIS — J3081 Allergic rhinitis due to animal (cat) (dog) hair and dander: Secondary | ICD-10-CM | POA: Diagnosis not present

## 2020-02-25 NOTE — Therapy (Signed)
[facility] [address] [place], [address] Phone: [phone]   Fax:  [phone]  Physical Therapy Treatment/Discharge Summary Reporting Period 01/08/20- 02/25/20  Patient Details  Name: Tracey Huff MRN: [id number] Date of Birth: 1971-03-17 No data recorded  Encounter Date: 02/25/2020   PT End of Session - 02/25/20 0845    Visit Number 9    Number of Visits 17    Date for PT Re-Evaluation 03/03/20    PT Start Time 0836    PT Stop Time 0903    PT Time Calculation (min) 27 min    Activity Tolerance Patient tolerated treatment well    Behavior During Therapy [facility] for tasks assessed/performed           Past Medical History:  Diagnosis Date  . Allergy   . Asthma   . Fibroid uterus 11/15/2014  . Headache   . Hypertension   . Menorrhagia with irregular cycle 11/15/2014  . Shortness of breath dyspnea     Past Surgical History:  Procedure Laterality Date  . ABDOMINAL HYSTERECTOMY N/A 11/15/2014   Procedure: HYSTERECTOMY ABDOMINAL/BILATERAL SALPINGECTOMY ;  Surgeon: Will Bonnet, MD;  Location: [facility] ORS;  Service: Gynecology;  Laterality: N/A;  . CESAREAN SECTION    . CYSTOSCOPY N/A 11/15/2014   Procedure: CYSTOSCOPY;  Surgeon: Will Bonnet, MD;  Location: [facility] ORS;  Service: Gynecology;  Laterality: N/A;  . DILATION AND CURETTAGE OF UTERUS    . GANGLION CYST EXCISION Left   . UTERINE FIBROID SURGERY      There were no vitals filed for this visit.   Subjective Assessment - 02/25/20 0842    Subjective Patient is having a stressful time at work, but her back pain is feeling good. Reports some pain when she first wakes up, currently no pain.    Pertinent History Patient reports R sided LBP with insidous onset since September 2020. Reports pain is to the R of low back and can travel down RLE and RLE "feels asleep". Pain is aggravated by standing after prolonged sitting, and when she gets up in  the morning. Worst pain over the past week 6/10; best 2/10. 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 Pt denies N/V, B&B changes, unexplained weight fluctuation, saddle paresthesia, fever, night sweats, or unrelenting night pain at this time.    Limitations Sitting;House hold activities;Lifting    How long can you sit comfortably? Less than 65mins    How long can you stand comfortably? unlimited    How long can you walk comfortably? unlimited    Diagnostic tests Xray    Patient Stated Goals decrease pain    Pain Onset 1 to 4 weeks ago           Ther-Ex NuStep Seat 3, Arms 5, L3 66mins (gentle AA/ROM of hips and lumbar spine   PT reviewed the following HEP with patient with patient able to demonstrate a set of the following with min cuing for correction needed. PT educated patient on parameters of therex (how/when to inc/decrease intensity, frequency, rep/set range, stretch hold time, and purpose of therex) with verbalized understanding.   Access Code: [email or web address] Dumbbell Squat at Shoulders - 1 x daily - 1-2 x weekly - 3 sets - 10 reps Side Stepping with Resistance at Ankles -  1 x daily - 1-2 x weekly - 3 sets - 10 reps Supine Double Leg Lift and Lower - 1 x daily - 1-2 x weekly - 3 sets - 10 reps Plank with Hip Extension - 1 x daily - 1-2 x weekly - 3 sets - 10 reps                      PT Short Term Goals - 02/25/20 0846      PT SHORT TERM GOAL #1   Title Pt will be independent with HEP in order to improve strength and decrease back pain in order to improve pain-free function at home and work.    Baseline 01/07/20 HEP given; completing HEP    Time 4    Period Weeks    Status Achieved             PT Long Term Goals - 02/25/20 0847      PT LONG TERM GOAL #1   Title Pt will decrease worst back pain as reported on  NPRS by at least 2 points in order to demonstrate clinically significant reduction in back pain.    Baseline 01/07/20 6/10; 02/25/20 0/10    Time 6    Period Weeks    Status Achieved      PT LONG TERM GOAL #2   Title Pt will increase R hip gross MMT grade to 5/5 in order to demonstrate improvement in strength and function.    Baseline 01/07/20 gross hip strength 5/5    Time 6    Period Weeks    Status Achieved      PT LONG TERM GOAL #3   Title Patient will increase FOTO score to 63 to demonstrate predicted increase in functional mobility to complete ADLs    Baseline 01/07/20 47; 02/25/20 94    Time 6    Period Weeks    Status Achieved                 Plan - 02/25/20 0905    Clinical Impression Statement PT reassessd goals this session where patient has met all goals to safely d/c to robust HEP. Patient is able to dmonstrate and verbalize understanding of all d/c recommendations and HEP. Pt given clinic contact info should any questions/concerns arise. Pt to d/c PT.    Personal Factors and Comorbidities Comorbidity 1;Fitness;Time since onset of injury/illness/exacerbation;Past/Current Experience    Comorbidities HTN    Examination-Activity Limitations Bed Mobility;Sit;Lift    Examination-Participation Restrictions Community Activity;Driving;Cleaning;Occupation    Stability/Clinical Decision Making Evolving/Moderate complexity    Clinical Decision Making Moderate    PT Frequency 2x / week    PT Duration 8 weeks    PT Treatment/Interventions Aquatic Therapy;Electrical Stimulation;ADLs/Self Care Home Management;Cryotherapy;Iontophoresis 4mg /ml Dexamethasone;Moist Heat;Traction;Ultrasound;Stair training;Gait training;Therapeutic exercise;Passive range of motion;Dry needling;Joint Manipulations;Spinal Manipulations;Patient/family education;Manual techniques;DME Instruction;Therapeutic activities;Functional mobility training;Neuromuscular re-education    PT Next Visit Plan assess ext based  therex, core strengthening, TND?    PT Home Exercise Plan repeated ext, piriformis stretch, seated lateral childs pose    Consulted and Agree with Plan of Care Patient           Patient will benefit from skilled therapeutic intervention in order to improve the following deficits and impairments:  Decreased mobility, Increased muscle spasms, Decreased activity tolerance, Decreased strength, Increased fascial restricitons, Impaired flexibility, Postural dysfunction, Pain, Obesity, Improper body mechanics, Impaired tone, Decreased range of motion  Visit Diagnosis: Chronic right-sided low back pain with right-sided  sciatica  Abnormal posture     Problem List Patient Active Problem List   Diagnosis Date Noted  . CIN III (cervical intraepithelial neoplasia grade III) with severe dysplasia 08/11/2018  . Atypical squamous cell changes of undetermined significance (ASCUS) on cervical cytology with positive high risk human papilloma virus (HPV) 06/29/2018  . Prediabetes 06/24/2018  . Class 1 obesity due to excess calories with serious comorbidity and body mass index (BMI) of 33.0 to 33.9 in adult 05/20/2018  . Essential hypertension 05/20/2018  . Hyperlipidemia 05/20/2018  . Asthma 05/20/2018  . Anxiety in acute stress reaction 05/20/2018  . Status post hysterectomy 11/15/2014   Durwin Reges DPT Durwin Reges 02/25/2020, 9:08 AM  [facility] [address], [place], [address] Phone: [phone]   Fax:  [phone]  Name: Tracey Huff MRN: [id number] Date of Birth: 1971/04/21

## 2020-02-29 ENCOUNTER — Encounter: Payer: 59 | Admitting: Physical Therapy

## 2020-03-02 DIAGNOSIS — J3081 Allergic rhinitis due to animal (cat) (dog) hair and dander: Secondary | ICD-10-CM | POA: Diagnosis not present

## 2020-03-02 DIAGNOSIS — J301 Allergic rhinitis due to pollen: Secondary | ICD-10-CM | POA: Diagnosis not present

## 2020-03-02 DIAGNOSIS — J3089 Other allergic rhinitis: Secondary | ICD-10-CM | POA: Diagnosis not present

## 2020-03-03 ENCOUNTER — Ambulatory Visit: Payer: 59 | Admitting: Physical Therapy

## 2020-03-09 DIAGNOSIS — J3089 Other allergic rhinitis: Secondary | ICD-10-CM | POA: Diagnosis not present

## 2020-03-09 DIAGNOSIS — J3081 Allergic rhinitis due to animal (cat) (dog) hair and dander: Secondary | ICD-10-CM | POA: Diagnosis not present

## 2020-03-09 DIAGNOSIS — J301 Allergic rhinitis due to pollen: Secondary | ICD-10-CM | POA: Diagnosis not present

## 2020-03-17 DIAGNOSIS — J301 Allergic rhinitis due to pollen: Secondary | ICD-10-CM | POA: Diagnosis not present

## 2020-03-17 DIAGNOSIS — J3089 Other allergic rhinitis: Secondary | ICD-10-CM | POA: Diagnosis not present

## 2020-03-17 DIAGNOSIS — J3081 Allergic rhinitis due to animal (cat) (dog) hair and dander: Secondary | ICD-10-CM | POA: Diagnosis not present

## 2020-03-22 MED FILL — LOSARTAN POTASSIUM 25 MG TA: 25 | 90 days supply | Qty: 45 | Fill #1

## 2020-03-24 DIAGNOSIS — J301 Allergic rhinitis due to pollen: Secondary | ICD-10-CM | POA: Diagnosis not present

## 2020-03-24 DIAGNOSIS — J3081 Allergic rhinitis due to animal (cat) (dog) hair and dander: Secondary | ICD-10-CM | POA: Diagnosis not present

## 2020-03-24 DIAGNOSIS — J3089 Other allergic rhinitis: Secondary | ICD-10-CM | POA: Diagnosis not present

## 2020-03-28 DIAGNOSIS — J3089 Other allergic rhinitis: Secondary | ICD-10-CM | POA: Diagnosis not present

## 2020-03-28 DIAGNOSIS — J3081 Allergic rhinitis due to animal (cat) (dog) hair and dander: Secondary | ICD-10-CM | POA: Diagnosis not present

## 2020-03-28 DIAGNOSIS — J301 Allergic rhinitis due to pollen: Secondary | ICD-10-CM | POA: Diagnosis not present

## 2020-03-29 ENCOUNTER — Other Ambulatory Visit: Payer: Self-pay

## 2020-03-29 ENCOUNTER — Ambulatory Visit
Admission: RE | Admit: 2020-03-29 | Discharge: 2020-03-29 | Disposition: A | Discharge status: Home / Self Care | Payer: 59 | Source: Ambulatory Visit | Attending: Family Medicine | Admitting: Family Medicine

## 2020-03-29 DIAGNOSIS — Z1231 Encounter for screening mammogram for malignant neoplasm of breast: Secondary | ICD-10-CM | POA: Diagnosis not present

## 2020-04-04 ENCOUNTER — Inpatient Hospital Stay
Admission: RE | Admit: 2020-04-04 | Discharge: 2020-04-04 | Disposition: A | Discharge status: Home / Self Care | Payer: Self-pay | Source: Ambulatory Visit | Attending: *Deleted | Admitting: *Deleted

## 2020-04-04 ENCOUNTER — Other Ambulatory Visit: Payer: Self-pay | Admitting: *Deleted

## 2020-04-04 DIAGNOSIS — J3081 Allergic rhinitis due to animal (cat) (dog) hair and dander: Secondary | ICD-10-CM | POA: Diagnosis not present

## 2020-04-04 DIAGNOSIS — Z1231 Encounter for screening mammogram for malignant neoplasm of breast: Secondary | ICD-10-CM

## 2020-04-04 DIAGNOSIS — J3089 Other allergic rhinitis: Secondary | ICD-10-CM | POA: Diagnosis not present

## 2020-04-04 DIAGNOSIS — J301 Allergic rhinitis due to pollen: Secondary | ICD-10-CM | POA: Diagnosis not present

## 2020-04-05 ENCOUNTER — Other Ambulatory Visit: Payer: Self-pay | Admitting: Family Medicine

## 2020-04-05 DIAGNOSIS — R921 Mammographic calcification found on diagnostic imaging of breast: Secondary | ICD-10-CM

## 2020-04-05 DIAGNOSIS — R928 Other abnormal and inconclusive findings on diagnostic imaging of breast: Secondary | ICD-10-CM

## 2020-04-14 ENCOUNTER — Other Ambulatory Visit: Payer: Self-pay

## 2020-04-14 ENCOUNTER — Ambulatory Visit
Admission: RE | Admit: 2020-04-14 | Discharge: 2020-04-14 | Disposition: A | Discharge status: Home / Self Care | Payer: 59 | Source: Ambulatory Visit | Attending: Family Medicine | Admitting: Family Medicine

## 2020-04-14 DIAGNOSIS — J301 Allergic rhinitis due to pollen: Secondary | ICD-10-CM | POA: Diagnosis not present

## 2020-04-14 DIAGNOSIS — R921 Mammographic calcification found on diagnostic imaging of breast: Secondary | ICD-10-CM | POA: Diagnosis not present

## 2020-04-14 DIAGNOSIS — J3089 Other allergic rhinitis: Secondary | ICD-10-CM | POA: Diagnosis not present

## 2020-04-14 DIAGNOSIS — J3081 Allergic rhinitis due to animal (cat) (dog) hair and dander: Secondary | ICD-10-CM | POA: Diagnosis not present

## 2020-04-14 DIAGNOSIS — R928 Other abnormal and inconclusive findings on diagnostic imaging of breast: Secondary | ICD-10-CM | POA: Diagnosis not present

## 2020-04-21 DIAGNOSIS — J301 Allergic rhinitis due to pollen: Secondary | ICD-10-CM | POA: Diagnosis not present

## 2020-04-21 DIAGNOSIS — J3089 Other allergic rhinitis: Secondary | ICD-10-CM | POA: Diagnosis not present

## 2020-04-21 DIAGNOSIS — J3081 Allergic rhinitis due to animal (cat) (dog) hair and dander: Secondary | ICD-10-CM | POA: Diagnosis not present

## 2020-04-27 DIAGNOSIS — J3081 Allergic rhinitis due to animal (cat) (dog) hair and dander: Secondary | ICD-10-CM | POA: Diagnosis not present

## 2020-04-27 DIAGNOSIS — J301 Allergic rhinitis due to pollen: Secondary | ICD-10-CM | POA: Diagnosis not present

## 2020-04-27 DIAGNOSIS — J3089 Other allergic rhinitis: Secondary | ICD-10-CM | POA: Diagnosis not present

## 2020-05-04 DIAGNOSIS — J3081 Allergic rhinitis due to animal (cat) (dog) hair and dander: Secondary | ICD-10-CM | POA: Diagnosis not present

## 2020-05-04 DIAGNOSIS — J3089 Other allergic rhinitis: Secondary | ICD-10-CM | POA: Diagnosis not present

## 2020-05-04 DIAGNOSIS — J301 Allergic rhinitis due to pollen: Secondary | ICD-10-CM | POA: Diagnosis not present

## 2020-05-11 DIAGNOSIS — J301 Allergic rhinitis due to pollen: Secondary | ICD-10-CM | POA: Diagnosis not present

## 2020-05-11 DIAGNOSIS — J3089 Other allergic rhinitis: Secondary | ICD-10-CM | POA: Diagnosis not present

## 2020-05-11 DIAGNOSIS — J3081 Allergic rhinitis due to animal (cat) (dog) hair and dander: Secondary | ICD-10-CM | POA: Diagnosis not present

## 2020-05-18 DIAGNOSIS — J3089 Other allergic rhinitis: Secondary | ICD-10-CM | POA: Diagnosis not present

## 2020-05-18 DIAGNOSIS — J301 Allergic rhinitis due to pollen: Secondary | ICD-10-CM | POA: Diagnosis not present

## 2020-05-18 DIAGNOSIS — J3081 Allergic rhinitis due to animal (cat) (dog) hair and dander: Secondary | ICD-10-CM | POA: Diagnosis not present

## 2020-05-22 ENCOUNTER — Telehealth: Payer: Self-pay

## 2020-05-22 DIAGNOSIS — I10 Essential (primary) hypertension: Secondary | ICD-10-CM

## 2020-05-22 NOTE — Telephone Encounter (Signed)
Copied from [place] [id number]. Topic: General - Other >> May 22, 2020  9:38 AM [name] wrote: Reason for CRM: Pt stated she was told to increase her dosage of losartan (COZAAR) 25 MG tablet due to elevated blood pressure but the prescription was not updated to reflect the increase. Pt stated she only has 2 pills remaining and she was told by the pharmacy that it is too soon to refill the Rx. Pt requests that the Rx be updated

## 2020-05-23 NOTE — Telephone Encounter (Signed)
Called to discuss medication with patient. Waiting on call back.

## 2020-05-23 NOTE — Telephone Encounter (Signed)
Patient returned call to [facility] and would like a call back. She can be reached at [phone]. Please advise

## 2020-05-24 ENCOUNTER — Other Ambulatory Visit: Payer: Self-pay | Admitting: Family Medicine

## 2020-05-24 ENCOUNTER — Other Ambulatory Visit: Payer: Self-pay

## 2020-05-24 DIAGNOSIS — I10 Essential (primary) hypertension: Secondary | ICD-10-CM

## 2020-05-24 MED ORDER — LOSARTAN POTASSIUM 25 MG PO TABS: 25.0000 mg | ORAL_TABLET | Freq: Every day | ORAL | 3 refills | Status: DC

## 2020-05-24 MED ORDER — LOSARTAN POTASSIUM 25 MG PO TABS: 12.5000 mg | ORAL_TABLET | Freq: Every day | ORAL | 3 refills | Status: DC

## 2020-05-24 MED FILL — LOSARTAN POTASSIUM 25 MG TA: 25 | 30 days supply | Qty: 30 | Fill #0

## 2020-05-24 NOTE — Telephone Encounter (Signed)
Blood pressure has been elevated. When she came in office she was told she can take entire pill instead of half. SHe is now out of medication. Need a new script called into pharmacy

## 2020-06-01 ENCOUNTER — Other Ambulatory Visit: Payer: Self-pay

## 2020-06-01 ENCOUNTER — Other Ambulatory Visit: Payer: Self-pay | Admitting: Family Medicine

## 2020-06-01 ENCOUNTER — Ambulatory Visit: Payer: Self-pay

## 2020-06-01 ENCOUNTER — Ambulatory Visit: Payer: 59 | Admitting: Family Medicine

## 2020-06-01 ENCOUNTER — Encounter: Payer: Self-pay | Admitting: Family Medicine

## 2020-06-01 VITALS — BP 134/86 | HR 77 | Temp 98.4°F | Resp 16 | Ht 60.0 in | Wt 176.1 lb

## 2020-06-01 DIAGNOSIS — R42 Dizziness and giddiness: Secondary | ICD-10-CM

## 2020-06-01 MED ORDER — MECLIZINE HCL 25 MG PO TABS: 12.5000 mg | ORAL_TABLET | Freq: Three times a day (TID) | ORAL | 0 refills | Status: DC | PRN

## 2020-06-01 MED ORDER — ONDANSETRON 4 MG PO TBDP: 4.0000 mg | ORAL_TABLET | Freq: Three times a day (TID) | ORAL | 0 refills | Status: DC | PRN

## 2020-06-01 MED FILL — ONDANSETRON ODT 4 MG TABLET: 4 | 4 days supply | Qty: 10 | Fill #0

## 2020-06-01 MED FILL — MECLIZINE 25 MG TABLET: 25 | 10 days supply | Qty: 30 | Fill #0

## 2020-06-01 NOTE — Telephone Encounter (Signed)
Pt. Reports she woke up this morning with dizziness and some vertigo. It is worse when she stands and walks. Some nausea with this "because I'm dizzy." Appointment made for today.  Reason for Disposition . [1] MODERATE dizziness (e.g., interferes with normal activities) AND [2] has NOT been evaluated by physician for this  (Exception: dizziness caused by heat exposure, sudden standing, or poor fluid intake)  Answer Assessment - Initial Assessment Questions 1. DESCRIPTION: "Describe your dizziness."     Dizzy 2. LIGHTHEADED: "Do you feel lightheaded?" (e.g., somewhat faint, woozy, weak upon standing)     Yes 3. VERTIGO: "Do you feel like either you or the room is spinning or tilting?" (i.e. vertigo)     Yes 4. SEVERITY: "How bad is it?"  "Do you feel like you are going to faint?" "Can you stand and walk?"   - MILD: Feels slightly dizzy, but walking normally.   - MODERATE: Feels very unsteady when walking, but not falling; interferes with normal activities (e.g., school, work) .   - SEVERE: Unable to walk without falling, or requires assistance to walk without falling; feels like passing out now.      Moderate 5. ONSET:  "When did the dizziness begin?"     This morning 6. AGGRAVATING FACTORS: "Does anything make it worse?" (e.g., standing, change in head position)     Standing 7. HEART RATE: "Can you tell me your heart rate?" "How many beats in 15 seconds?"  (Note: not all patients can do this)       No 8. CAUSE: "What do you think is causing the dizziness?"     Unsure 9. RECURRENT SYMPTOM: "Have you had dizziness before?" If Yes, ask: "When was the last time?" "What happened that time?"     No 10. OTHER SYMPTOMS: "Do you have any other symptoms?" (e.g., fever, chest pain, vomiting, diarrhea, bleeding)       Nausea 11. PREGNANCY: "Is there any chance you are pregnant?" "When was your last menstrual period?"       No  Protocols used: DIZZINESS [facility]

## 2020-06-01 NOTE — Patient Instructions (Signed)
Vertigo Vertigo is the feeling that you or the things around you are moving when they are not. This feeling can come and go at any time. Vertigo often goes away on its own. This condition can be dangerous if it happens when you are doing activities like driving or working with machines. Your doctor will do tests to find the cause of your vertigo. These tests will also help your doctor decide on the best treatment for you. Follow these instructions at home: Eating and drinking  Drink enough fluid to keep your pee (urine) pale yellow.  Do not drink alcohol.      Activity  Return to your normal activities as told by your doctor. Ask your doctor what activities are safe for you.  In the morning, first sit up on the side of the bed. When you feel okay, stand slowly while you hold onto something until you know that your balance is fine.  Move slowly. Avoid sudden body or head movements or certain positions, as told by your doctor.  Use a cane if you have trouble standing or walking.  Sit down right away if you feel dizzy.  Avoid doing any tasks or activities that can cause danger to you or others if you get dizzy.  Avoid bending down if you feel dizzy. Place items in your home so that they are easy for you to reach without leaning over.  Do not drive or use heavy machinery if you feel dizzy. General instructions  Take over-the-counter and prescription medicines only as told by your doctor.  Keep all follow-up visits as told by your doctor. This is important. Contact a doctor if:  Your medicine does not help your vertigo.  You have a fever.  Your problems get worse or you have new symptoms.  Your family or friends see changes in your behavior.  The feeling of being sick to your stomach gets worse.  Your vomiting gets worse.  You lose feeling (have numbness) in part of your body.  You feel prickling and tingling in a part of your body. Get help right away if:  You have  trouble moving or talking.  You are always dizzy.  You pass out (faint).  You get very bad headaches.  You feel weak in your hands, arms, or legs.  You have changes in your hearing.  You have changes in how you see (vision).  You get a stiff neck.  Bright light starts to bother you. Summary  Vertigo is the feeling that you or the things around you are moving when they are not.  Your doctor will do tests to find the cause of your vertigo.  You may be told to avoid some tasks, positions, or movements.  Contact a doctor if your medicine is not helping, or if you have a fever, new symptoms, or a change in behavior.  Get help right away if you get very bad headaches, or if you have changes in how you speak, hear, or see. This information is not intended to replace advice given to you by your health care provider. Make sure you discuss any questions you have with your health care provider. Document Revised: 03/16/2018 Document Reviewed: 03/16/2018 Elsevier Patient Education  2021 [employer] [employer].   How to Perform the Epley Maneuver The Epley maneuver is an exercise that relieves symptoms of vertigo. Vertigo is the feeling that you or your surroundings are moving when they are not. When you feel vertigo, you may feel like the  room is spinning and may have trouble walking. The Epley maneuver is used for a type of vertigo caused by a calcium deposit in a part of the inner ear. The maneuver involves changing head positions to help the deposit move out of the area. You can do this maneuver at home whenever you have symptoms of vertigo. You can repeat it in 24 hours if your vertigo has not gone away. Even though the Epley maneuver may relieve your vertigo for a few weeks, it is possible that your symptoms will return. This maneuver relieves vertigo, but it does not relieve dizziness. What are the risks? If it is done correctly, the Epley maneuver is considered safe. Sometimes it can lead to  dizziness or nausea that goes away after a short time. If you develop other symptoms--such as changes in vision, weakness, or numbness--stop doing the maneuver and call your health care provider. Supplies needed:  A bed or table.  A pillow. How to do the Epley maneuver 1. Sit on the edge of a bed or table with your back straight and your legs extended or hanging over the edge of the bed or table. 2. Turn your head halfway toward the affected ear or side as told by your health care provider. 3. Lie backward quickly with your head turned until you are lying flat on your back. You may want to position a pillow under your shoulders. 4. Hold this position for at least 30 seconds. If you feel dizzy or have symptoms of vertigo, continue to hold the position until the symptoms stop. 5. Turn your head to the opposite direction until your unaffected ear is facing the floor. 6. Hold this position for at least 30 seconds. If you feel dizzy or have symptoms of vertigo, continue to hold the position until the symptoms stop. 7. Turn your whole body to the same side as your head so that you are positioned on your side. Your head will now be nearly facedown. Hold for at least 30 seconds. If you feel dizzy or have symptoms of vertigo, continue to hold the position until the symptoms stop. 8. Sit back up. You can repeat the maneuver in 24 hours if your vertigo does not go away.      Follow these instructions at home: For 24 hours after doing the Epley maneuver:  Keep your head in an upright position.  When lying down to sleep or rest, keep your head raised (elevated) with two or more pillows.  Avoid excessive neck movements. Activity  Do not drive or use machinery if you feel dizzy.  After doing the Epley maneuver, return to your normal activities as told by your health care provider. Ask your health care provider what activities are safe for you. General instructions  Drink enough fluid to keep your  urine pale yellow.  Do not drink alcohol.  Take over-the-counter and prescription medicines only as told by your health care provider.  Keep all follow-up visits as told by your health care provider. This is important. Preventing vertigo symptoms Ask your health care provider if there is anything you should do at home to prevent vertigo. He or she may recommend that you:  Keep your head elevated with two or more pillows while you sleep.  Do not sleep on the side of your affected ear.  Get up slowly from bed.  Avoid sudden movements during the day.  Avoid extreme head positions or movement, such as looking up or bending over. Contact a  health care provider if:  Your vertigo gets worse.  You have other symptoms, including: ? Nausea. ? Vomiting. ? Headache. Get help right away if you:  Have vision changes.  Have a headache or neck pain that is severe or getting worse.  Cannot stop vomiting.  Have new numbness or weakness in any part of your body. Summary  Vertigo is the feeling that you or your surroundings are moving when they are not.  The Epley maneuver is an exercise that relieves symptoms of vertigo.  If the Epley maneuver is done correctly, it is considered safe and relieves vertigo quickly. This information is not intended to replace advice given to you by your health care provider. Make sure you discuss any questions you have with your health care provider. Document Revised: 02/17/2019 Document Reviewed: 02/17/2019 Elsevier Patient Education  2021 [employer].

## 2020-06-01 NOTE — Progress Notes (Signed)
Patient ID: Tracey Huff, female    DOB: Jan 23, 1971, 50 y.o.   MRN: [id number]  PCP: Delsa Grana, PA-C  Chief Complaint  Patient presents with  . Dizziness    Subjective:   Tracey Huff is a 50 y.o. female, presents to clinic with CC of the following:  Patient presents with onset of dizziness and a sensation of the room spinning that occurred this morning when she tried to roll out of bed.  The symptom lasted for at least an hour and caused nausea she did have an episode of vomiting.  It has been reproducible with head movements and with positional changes.  She has not felt like she was going to pass out.  She has never had this before.  She denies associated focal weakness numbness, facial droop, confusion, visual disturbances.  She has a history of allergies that have been well controlled no increase in any nasal, sinus or ear symptoms.   She denies any near syncope, palpitations, shortness of breath, diaphoresis, orthopnea, lower extremity edema No recent illness  Dizziness This is a new problem. The current episode started today. The problem occurs 2 to 4 times per day. The problem has been unchanged. Associated symptoms include nausea, vertigo and vomiting. Pertinent negatives include no abdominal pain, anorexia, arthralgias, change in bowel habit, chest pain, chills, congestion, coughing, diaphoresis, fatigue, fever, headaches, joint swelling, myalgias, neck pain, numbness, rash, sore throat, swollen glands, urinary symptoms, visual change or weakness. The symptoms are aggravated by bending, twisting and standing (Head movements, going from laying to sitting or sitting to standing). She has tried nothing for the symptoms. The treatment provided no relief.      Patient Active Problem List   Diagnosis Date Noted  . CIN III (cervical intraepithelial neoplasia grade III) with severe dysplasia 08/11/2018  . Atypical squamous cell changes of undetermined significance (ASCUS)  on cervical cytology with positive high risk human papilloma virus (HPV) 06/29/2018  . Prediabetes 06/24/2018  . Class 1 obesity due to excess calories with serious comorbidity and body mass index (BMI) of 33.0 to 33.9 in adult 05/20/2018  . Essential hypertension 05/20/2018  . Hyperlipidemia 05/20/2018  . Asthma 05/20/2018  . Anxiety in acute stress reaction 05/20/2018  . Status post hysterectomy 11/15/2014      Current Outpatient Medications:  .  albuterol (PROVENTIL HFA;VENTOLIN HFA) 108 (90 BASE) MCG/ACT inhaler, Inhale 1-2 puffs into the lungs every 6 (six) hours as needed for wheezing or shortness of breath., Disp: , Rfl:  .  Ascorbic Acid (VITAMIN C) 100 MG tablet, Take 100 mg by mouth daily., Disp: , Rfl:  .  atorvastatin (LIPITOR) 20 MG tablet, Take 1 tablet (20 mg total) by mouth at bedtime., Disp: 90 tablet, Rfl: 3 .  budesonide-formoterol (SYMBICORT) 160-4.5 MCG/ACT inhaler, Inhale into the lungs., Disp: , Rfl:  .  busPIRone (BUSPAR) 7.5 MG tablet, Take 1 tablet once daily at night; may take 1 additional tablet daily if needed for anxiety., Disp: 180 tablet, Rfl: 1 .  Cholecalciferol 25 MCG (1000 UT) capsule, Take by mouth., Disp: , Rfl:  .  ELDERBERRY PO, Take by mouth., Disp: , Rfl:  .  EPINEPHrine 0.15 MG/0.15ML IJ injection, Inject into the skin. , Disp: , Rfl:  .  levocetirizine (XYZAL) 5 MG tablet, Take 5 mg by mouth every evening., Disp: , Rfl:  .  losartan (COZAAR) 25 MG tablet, Take 1 tablet (25 mg total) by mouth daily., Disp: 90 tablet, Rfl: 3 .  meclizine (ANTIVERT) 25 MG tablet, Take 0.5-1 tablets (12.5-25 mg total) by mouth 3 (three) times daily as needed for dizziness., Disp: 30 tablet, Rfl: 0 .  montelukast (SINGULAIR) 10 MG tablet, Take by mouth., Disp: , Rfl:  .  Multiple Vitamin (MULTI-VITAMIN DAILY PO), Take by mouth., Disp: , Rfl:  .  tiZANidine (ZANAFLEX) 4 MG tablet, Take 0.5-1.5 tablets (2-6 mg total) by mouth every 8 (eight) hours as needed for muscle  spasms., Disp: 30 tablet, Rfl: 0 .  hydrOXYzine (ATARAX/VISTARIL) 10 MG tablet, Take 1-2 tablets (10-20 mg total) by mouth 3 (three) times daily as needed for itching (rash). (Patient not taking: Reported on 06/01/2020), Disp: 60 tablet, Rfl: 0   Allergies  Allergen Reactions  . Macadamia Nut Oil Shortness Of Breath  . Apple Nausea And Vomiting  . Carrot Oil     Can not cooked raw carrots  . Fruit & Vegetable Daily [Nutritional Supplements] Nausea And Vomiting    Cannot tolerate apples, peaches, plums, and nectarines  . Kiwi Extract Nausea And Vomiting  . Peach [Prunus Persica] Nausea And Vomiting     Social History   Tobacco Use  . Smoking status: Never Smoker  . Smokeless tobacco: Never Used  Vaping Use  . Vaping Use: Never used  Substance Use Topics  . Alcohol use: Not Currently    Comment: occ  . Drug use: No      Chart Review Today: I personally reviewed active problem list, medication list, allergies, family history, social history, health maintenance, notes from last encounter, lab results, imaging with the patient/caregiver today.   Review of Systems  Constitutional: Negative.  Negative for chills, diaphoresis, fatigue and fever.  HENT: Negative.  Negative for congestion and sore throat.   Eyes: Negative.   Respiratory: Negative.  Negative for cough.   Cardiovascular: Negative.  Negative for chest pain.  Gastrointestinal: Positive for nausea and vomiting. Negative for abdominal pain, anorexia and change in bowel habit.  Endocrine: Negative.   Genitourinary: Negative.   Musculoskeletal: Negative.  Negative for arthralgias, joint swelling, myalgias and neck pain.  Skin: Negative.  Negative for rash.  Allergic/Immunologic: Negative.   Neurological: Positive for dizziness and vertigo. Negative for weakness, numbness and headaches.  Hematological: Negative.   Psychiatric/Behavioral: Negative.   All other systems reviewed and are negative.      Objective:    Vitals:   06/01/20 1114  BP: 134/86  Pulse: 77  Resp: 16  Temp: 98.4 F (36.9 C)  SpO2: 99%  Weight: 176 lb 1.6 oz (79.9 kg)  Height: 5' (1.524 m)    Body mass index is 34.39 kg/m.  Physical Exam Vitals and nursing note reviewed.  Constitutional:      General: She is not in acute distress.    Appearance: Normal appearance. She is well-developed. She is obese. She is not ill-appearing, toxic-appearing or diaphoretic.     Interventions: Face mask in place.  HENT:     Head: Normocephalic and atraumatic.     Right Ear: Hearing, tympanic membrane, ear canal and external ear normal.     Left Ear: Hearing, tympanic membrane, ear canal and external ear normal.     Nose: Mucosal edema present. No congestion.     Right Turbinates: Enlarged.     Left Turbinates: Enlarged.     Right Sinus: No maxillary sinus tenderness or frontal sinus tenderness.     Left Sinus: No maxillary sinus tenderness or frontal sinus tenderness.     Mouth/Throat:  Mouth: Mucous membranes are moist.     Pharynx: Oropharynx is clear. Uvula midline. No pharyngeal swelling, oropharyngeal exudate, posterior oropharyngeal erythema or uvula swelling.  Eyes:     General: Lids are normal. No scleral icterus.       Right eye: No discharge.        Left eye: No discharge.     Conjunctiva/sclera: Conjunctivae normal.  Neck:     Trachea: Phonation normal. No tracheal deviation.  Cardiovascular:     Rate and Rhythm: Normal rate and regular rhythm.     Pulses: Normal pulses.          Radial pulses are 2+ on the right side and 2+ on the left side.       Posterior tibial pulses are 2+ on the right side and 2+ on the left side.     Heart sounds: Normal heart sounds. No murmur heard. No friction rub. No gallop.   Pulmonary:     Effort: Pulmonary effort is normal. No respiratory distress.     Breath sounds: Normal breath sounds. No stridor. No wheezing, rhonchi or rales.  Chest:     Chest wall: No tenderness.   Abdominal:     General: Bowel sounds are normal. There is no distension.     Palpations: Abdomen is soft.  Musculoskeletal:     Right lower leg: No edema.     Left lower leg: No edema.  Skin:    General: Skin is warm and dry.     Capillary Refill: Capillary refill takes less than 2 seconds.     Coloration: Skin is not jaundiced or pale.     Findings: No rash.  Neurological:     Mental Status: She is alert and oriented to person, place, and time.     Motor: No abnormal muscle tone.     Gait: Gait normal.     Comments: MENTAL STATUS: AAOx3, memory intact, fund of knowledge appropriate  LANG/SPEECH: Naming and repetition intact, fluent, no dysarthria, follows 3-step commands, answers questions appropriately   CRANIAL NERVES:   II: Pupils equal and reactive, no RAPD   III, IV, VI: EOM intact, no gaze preference or deviation, no nystagmus.   V: normal sensation in V1, V2, and V3 segments bilaterally   VII: no asymmetry, no nasolabial fold flattening   VIII: normal hearing to speech   IX, X: normal palatal elevation, no uvular deviation   XI: 5/5 head turn and 5/5 shoulder shrug bilaterally   XII: midline tongue protrusion  MOTOR:  5/5 bilateral grip strength 5/5 strength dorsiflexion/plantarflexion b/l   SENSORY:  Normal to light touch Romberg absent  COORD: Normal finger to nose and heel to shin, no tremor, no dysmetria  STATION: normal stance, no truncal ataxia  GAIT: Normal;   Psychiatric:        Mood and Affect: Mood normal.        Speech: Speech normal.        Behavior: Behavior normal.      Orthostatics:  Symptomatic with all positional changes, orthostatics reviewed - mildly positive w/o hypotension (see chart for orthostatics)      Assessment & Plan:     ICD-10-CM   1. Vertigo  R42 meclizine (ANTIVERT) 25 MG tablet    Ambulatory referral to ENT    ondansetron (ZOFRAN ODT) 4 MG disintegrating tablet   suspect peripheral vertigo - reproducible with  head movements and positional changes, no neuro deficit - likely BPPV vs labyrinthitis?  2. Orthostatic dizziness  R42 ondansetron (ZOFRAN ODT) 4 MG disintegrating tablet   mildly positive, SBP 138 with laying, dropped to 118 with standing, no hypotension, encouraged to push hydration and electrolytes   Trial of meclizine, patient encouraged to rest, hydrate, she may want to try Epley maneuvers at home unable to do in office with her exam tables and with how symptomatic the patient is currently.   If patient is not improving in the next week I would want her to follow-up with ENT  She was given Zofran for associated nausea.  Believes she was minimally orthostatic due to her nausea and vomiting that occurred this morning secondary to her vertigo symptoms.  She does not have any neuro deficit, cardiac symptoms and she was not hypotensive her vital signs were stable here today  Encouraged her to follow-up with me early next week if still symptomatic we will try to get her into the specialist sooner     Delsa Grana, PA-C 06/01/20 11:39 AM

## 2020-06-08 DIAGNOSIS — J3081 Allergic rhinitis due to animal (cat) (dog) hair and dander: Secondary | ICD-10-CM | POA: Diagnosis not present

## 2020-06-08 DIAGNOSIS — J301 Allergic rhinitis due to pollen: Secondary | ICD-10-CM | POA: Diagnosis not present

## 2020-06-08 DIAGNOSIS — J3089 Other allergic rhinitis: Secondary | ICD-10-CM | POA: Diagnosis not present

## 2020-06-15 DIAGNOSIS — J3089 Other allergic rhinitis: Secondary | ICD-10-CM | POA: Diagnosis not present

## 2020-06-15 DIAGNOSIS — J301 Allergic rhinitis due to pollen: Secondary | ICD-10-CM | POA: Diagnosis not present

## 2020-06-15 DIAGNOSIS — J3081 Allergic rhinitis due to animal (cat) (dog) hair and dander: Secondary | ICD-10-CM | POA: Diagnosis not present

## 2020-06-16 DIAGNOSIS — H8111 Benign paroxysmal vertigo, right ear: Secondary | ICD-10-CM | POA: Diagnosis not present

## 2020-06-23 DIAGNOSIS — J3081 Allergic rhinitis due to animal (cat) (dog) hair and dander: Secondary | ICD-10-CM | POA: Diagnosis not present

## 2020-06-23 DIAGNOSIS — J301 Allergic rhinitis due to pollen: Secondary | ICD-10-CM | POA: Diagnosis not present

## 2020-06-23 DIAGNOSIS — J3089 Other allergic rhinitis: Secondary | ICD-10-CM | POA: Diagnosis not present

## 2020-06-28 ENCOUNTER — Encounter: Payer: Self-pay | Admitting: Family Medicine

## 2020-06-28 ENCOUNTER — Ambulatory Visit: Payer: 59 | Admitting: Family Medicine

## 2020-06-28 ENCOUNTER — Other Ambulatory Visit: Payer: Self-pay

## 2020-06-28 VITALS — BP 128/82 | HR 86 | Temp 98.7°F | Resp 16 | Ht 60.0 in | Wt 177.1 lb

## 2020-06-28 DIAGNOSIS — J45909 Unspecified asthma, uncomplicated: Secondary | ICD-10-CM

## 2020-06-28 DIAGNOSIS — E785 Hyperlipidemia, unspecified: Secondary | ICD-10-CM

## 2020-06-28 DIAGNOSIS — Z1159 Encounter for screening for other viral diseases: Secondary | ICD-10-CM

## 2020-06-28 DIAGNOSIS — J3089 Other allergic rhinitis: Secondary | ICD-10-CM | POA: Diagnosis not present

## 2020-06-28 DIAGNOSIS — Z5181 Encounter for therapeutic drug level monitoring: Secondary | ICD-10-CM | POA: Diagnosis not present

## 2020-06-28 DIAGNOSIS — R7303 Prediabetes: Secondary | ICD-10-CM | POA: Diagnosis not present

## 2020-06-28 DIAGNOSIS — Z1211 Encounter for screening for malignant neoplasm of colon: Secondary | ICD-10-CM | POA: Diagnosis not present

## 2020-06-28 DIAGNOSIS — I10 Essential (primary) hypertension: Secondary | ICD-10-CM

## 2020-06-28 NOTE — Progress Notes (Signed)
Name: Tracey Huff   MRN: [id number]    DOB: [birth year]   Date:06/28/2020       Progress Note  Chief Complaint  Patient presents with  . Follow-up    40months  . Hypertension  . Hyperlipidemia     Subjective:   Tracey Huff is a 50 y.o. female, presents to clinic for routine f/up  Hypertension:  Currently managed on losartan 12.5 mg, recently blood pressure was elevated and her dose was increased to 25 but she has since been able to decrease back down Pt reports good med compliance and denies any SE.   Blood pressure today is well controlled. BP Readings from Last 3 Encounters:  06/28/20 128/82  06/01/20 134/86  02/02/20 (!) 134/92   Pt denies CP, SOB, exertional sx, LE edema, palpitation, Ha's, visual disturbances, lightheadedness, hypotension, syncope. Dietary efforts for BP?  Working on diet/lifestyle, exercising more  Hyperlipidemia:  Not on meds, was trying to work on diet/exercise, familial HLD?  Last Lipids: Lab Results  Component Value Date   CHOL 251 (H) 12/27/2019   CHOL 261 (H) 06/28/2019   Lab Results  Component Value Date   HDL 53 12/27/2019   HDL 57 06/28/2019   Lab Results  Component Value Date   LDLCALC 177 (H) 12/27/2019   LDLCALC 182 (H) 06/28/2019   Lab Results  Component Value Date   TRIG 94 12/27/2019   TRIG 97 06/28/2019   Lab Results  Component Value Date   CHOLHDL 4.7 12/27/2019   CHOLHDL 4.6 06/28/2019   - Denies: Chest pain, shortness of breath, myalgias, claudication  Asthma/allergies Improving control she has less dyspnea on exertion and exercise-induced bronchospasm she feels she can exercise more and harder and longer with evident short of breath, she is on Symbicort, using albuterol rescue inhaler less often, continues to use nasal sprays, pataday prn for eye allergies, xyzal and singulair        Current Outpatient Medications:  .  albuterol (PROVENTIL HFA;VENTOLIN HFA) 108 (90 BASE) MCG/ACT inhaler, Inhale  1-2 puffs into the lungs every 6 (six) hours as needed for wheezing or shortness of breath., Disp: , Rfl:  .  Ascorbic Acid (VITAMIN C) 100 MG tablet, Take 100 mg by mouth daily., Disp: , Rfl:  .  atorvastatin (LIPITOR) 20 MG tablet, Take 1 tablet (20 mg total) by mouth at bedtime., Disp: 90 tablet, Rfl: 3 .  budesonide-formoterol (SYMBICORT) 160-4.5 MCG/ACT inhaler, Inhale into the lungs., Disp: , Rfl:  .  busPIRone (BUSPAR) 7.5 MG tablet, Take 1 tablet once daily at night; may take 1 additional tablet daily if needed for anxiety., Disp: 180 tablet, Rfl: 1 .  Cholecalciferol 25 MCG (1000 UT) capsule, Take by mouth., Disp: , Rfl:  .  ELDERBERRY PO, Take by mouth., Disp: , Rfl:  .  EPINEPHrine 0.15 MG/0.15ML IJ injection, Inject into the skin. , Disp: , Rfl:  .  hydrOXYzine (ATARAX/VISTARIL) 10 MG tablet, Take 1-2 tablets (10-20 mg total) by mouth 3 (three) times daily as needed for itching (rash)., Disp: 60 tablet, Rfl: 0 .  levocetirizine (XYZAL) 5 MG tablet, Take 5 mg by mouth every evening., Disp: , Rfl:  .  losartan (COZAAR) 25 MG tablet, Take 1 tablet (25 mg total) by mouth daily., Disp: 90 tablet, Rfl: 3 .  meclizine (ANTIVERT) 25 MG tablet, Take 0.5-1 tablets (12.5-25 mg total) by mouth 3 (three) times daily as needed for dizziness., Disp: 30 tablet, Rfl: 0 .  montelukast (SINGULAIR)  10 MG tablet, Take by mouth., Disp: , Rfl:  .  Multiple Vitamin (MULTI-VITAMIN DAILY PO), Take by mouth., Disp: , Rfl:  .  ondansetron (ZOFRAN ODT) 4 MG disintegrating tablet, Take 1 tablet (4 mg total) by mouth every 8 (eight) hours as needed for nausea or vomiting., Disp: 10 tablet, Rfl: 0 .  tiZANidine (ZANAFLEX) 4 MG tablet, Take 0.5-1.5 tablets (2-6 mg total) by mouth every 8 (eight) hours as needed for muscle spasms., Disp: 30 tablet, Rfl: 0 .  azelastine (ASTELIN) 0.1 % nasal spray, 1-2 puffs in each nostril, Disp: , Rfl:  .  fluticasone (FLONASE) 50 MCG/ACT nasal spray, 1-2 sprays in each nostril, Disp: ,  Rfl:  .  Olopatadine HCl (PATADAY) 0.2 % SOLN, 1 drop into affected eye, Disp: , Rfl:   Patient Active Problem List   Diagnosis Date Noted  . CIN III (cervical intraepithelial neoplasia grade III) with severe dysplasia 08/11/2018  . Atypical squamous cell changes of undetermined significance (ASCUS) on cervical cytology with positive high risk human papilloma virus (HPV) 06/29/2018  . Prediabetes 06/24/2018  . Class 1 obesity due to excess calories with serious comorbidity and body mass index (BMI) of 33.0 to 33.9 in adult 05/20/2018  . Essential hypertension 05/20/2018  . Hyperlipidemia 05/20/2018  . Asthma 05/20/2018  . Anxiety in acute stress reaction 05/20/2018  . Status post hysterectomy 11/15/2014    Past Surgical History:  Procedure Laterality Date  . ABDOMINAL HYSTERECTOMY N/A 11/15/2014   Procedure: HYSTERECTOMY ABDOMINAL/BILATERAL SALPINGECTOMY ;  Surgeon: Will Bonnet, MD;  Location: [facility] ORS;  Service: Gynecology;  Laterality: N/A;  . BREAST BIOPSY Right    2019 negative  . CESAREAN SECTION    . CYSTOSCOPY N/A 11/15/2014   Procedure: CYSTOSCOPY;  Surgeon: Will Bonnet, MD;  Location: [facility] OR[facility];  Service: Gynecology;  Laterality: N/A;  . DILATION AND CURETTAGE OF UTERUS    . GANGLION CYST EXCISION Left   . UTERINE FIBROID SURGERY      Family History  Problem Relation Age of Onset  . Congestive Heart Failure Mother   . Diabetes Mother   . Hypertension Mother   . Diabetes Maternal Grandmother   . Congestive Heart Failure Maternal Grandfather   . Breast cancer Neg Hx     Social History   Tobacco Use  . Smoking status: Never Smoker  . Smokeless tobacco: Never Used  Vaping Use  . Vaping Use: Never used  Substance Use Topics  . Alcohol use: Not Currently    Comment: occ  . Drug use: No     Allergies  Allergen Reactions  . Macadamia Nut Oil Shortness Of Breath  . Apple Nausea And Vomiting  . Carrot Oil     Can not cooked raw carrots  . Fruit &  Vegetable Daily [Nutritional Supplements] Nausea And Vomiting    Cannot tolerate apples, peaches, plums, and nectarines  . Kiwi Extract Nausea And Vomiting  . Peach [Prunus Persica] Nausea And Vomiting    Health Maintenance  Topic Date Due  . Hepatitis C Screening  Never done  . COLONOSCOPY (Pts 45-45yrs Insurance coverage will need to be confirmed)  Never done  . COVID-19 Vaccine (3 - Booster for Pfizer series) 06/11/2020  . MAMMOGRAM  04/14/2021  . PAP SMEAR-Modifier  02/02/2023  . TETANUS/TDAP  05/06/2024  . INFLUENZA VACCINE  Completed  . HIV Screening  Completed    Chart Review Today: I personally reviewed active problem list, medication list, allergies, family history, social  history, health maintenance, notes from last encounter, lab results, imaging with the patient/caregiver today.   Review of Systems  Constitutional: Negative.   HENT: Negative.   Eyes: Negative.   Respiratory: Negative.   Cardiovascular: Negative.   Gastrointestinal: Negative.   Endocrine: Negative.   Genitourinary: Negative.   Musculoskeletal: Negative.   Skin: Negative.   Allergic/Immunologic: Negative.   Neurological: Negative.   Hematological: Negative.   Psychiatric/Behavioral: Negative.   All other systems reviewed and are negative.    Objective:   Vitals:   06/28/20 0908  BP: 128/82  Pulse: 86  Resp: 16  Temp: 98.7 F (37.1 C)  SpO2: 98%  Weight: 177 lb 1.6 oz (80.3 kg)  Height: 5' (1.524 m)    Body mass index is 34.59 kg/m.  Physical Exam Vitals and nursing note reviewed.  Constitutional:      General: She is not in acute distress.    Appearance: Normal appearance. She is well-developed. She is obese. She is not ill-appearing, toxic-appearing or diaphoretic.     Interventions: Face mask in place.  HENT:     Head: Normocephalic and atraumatic.     Right Ear: External ear normal.     Left Ear: External ear normal.  Eyes:     General: Lids are normal. No scleral  icterus.       Right eye: No discharge.        Left eye: No discharge.     Conjunctiva/sclera: Conjunctivae normal.  Neck:     Trachea: Phonation normal. No tracheal deviation.  Cardiovascular:     Rate and Rhythm: Normal rate and regular rhythm.     Pulses: Normal pulses.          Radial pulses are 2+ on the right side and 2+ on the left side.       Posterior tibial pulses are 2+ on the right side and 2+ on the left side.     Heart sounds: Normal heart sounds. No murmur heard. No friction rub. No gallop.   Pulmonary:     Effort: Pulmonary effort is normal. No respiratory distress.     Breath sounds: Normal breath sounds. No stridor. No wheezing, rhonchi or rales.  Chest:     Chest wall: No tenderness.  Abdominal:     General: Bowel sounds are normal. There is no distension.     Palpations: Abdomen is soft.  Musculoskeletal:     Right lower leg: No edema.     Left lower leg: No edema.  Skin:    General: Skin is warm and dry.     Capillary Refill: Capillary refill takes less than 2 seconds.     Coloration: Skin is not jaundiced or pale.     Findings: No rash.  Neurological:     Mental Status: She is alert. Mental status is at baseline.     Motor: No abnormal muscle tone.     Gait: Gait normal.  Psychiatric:        Mood and Affect: Mood normal.        Speech: Speech normal.        Behavior: Behavior normal.         Assessment & Plan:     ICD-10-CM   1. Essential hypertension  Z36 COMPLETE METABOLIC PANEL WITH GFR   Stable, currently well controlled, BP at goal she decreased her med dose back to 12.5 daily working on diet and lifestyle  2. Hyperlipidemia, unspecified hyperlipidemia type  E78.5 Lipid  panel    COMPLETE METABOLIC PANEL WITH GFR   cholesterol very high, recheck labs, I recommended starting statin if LDL is still >160   3. Prediabetes  D02.28 COMPLETE METABOLIC PANEL WITH GFR    Hemoglobin A1C   recheck A1C, pt will continue to work on healthy  diet/lifestyle and exercising  4. Asthma, unspecified asthma severity, unspecified whether complicated, unspecified whether persistent  J45.909    improved control with symbicort, singulair and albuterol prn - better exercise tolerance and less DOE and EIB  5. Environmental and seasonal allergies  J30.89 azelastine (ASTELIN) 0.1 % nasal spray    fluticasone (FLONASE) 50 MCG/ACT nasal spray    Olopatadine HCl (PATADAY) 0.2 % SOLN   continue singulair, antihistamine, nose spray and prn eye drops  6. Encounter for hepatitis C screening test for low risk patient  Z11.59 Hepatitis C Antibody  7. Screening for colon cancer  Z12.11 Ambulatory referral to Gastroenterology  8. Encounter for medication monitoring  Z51.81 Lipid panel    COMPLETE METABOLIC PANEL WITH GFR    Hemoglobin A1C    CBC with Differential/Platelet     Return for 4-6 M CPE f/up split visit .   Delsa Grana, PA-C 06/28/20 9:33 AM

## 2020-06-29 LAB — COMPLETE METABOLIC PANEL WITH GFR
AG Ratio: 1.4 (calc) (ref 1.0–2.5)
ALT: 15 U/L (ref 6–29)
AST: 14 U/L (ref 10–35)
Albumin: 4.2 g/dL (ref 3.6–5.1)
Alkaline phosphatase (APISO): 40 U/L (ref 31–125)
BUN: 11 mg/dL (ref 7–25)
CO2: 25 mmol/L (ref 20–32)
Calcium: 9.3 mg/dL (ref 8.6–10.2)
Chloride: 104 mmol/L (ref 98–110)
Creat: 0.79 mg/dL (ref 0.50–1.10)
GFR, Est African American: 102 mL/min/{1.73_m2} (ref >=60)
GFR, Est Non African American: 88 mL/min/{1.73_m2} (ref >=60)
Globulin: 2.9 g/dL (calc) (ref 1.9–3.7)
Glucose, Bld: 105 mg/dL — ABNORMAL HIGH (ref 65–99)
Potassium: 4.4 mmol/L (ref 3.5–5.3)
Sodium: 136 mmol/L (ref 135–146)
Total Bilirubin: 0.4 mg/dL (ref 0.2–1.2)
Total Protein: 7.1 g/dL (ref 6.1–8.1)

## 2020-06-29 LAB — CBC WITH DIFFERENTIAL/PLATELET
Absolute Monocytes: 555 cells/uL (ref 200–950)
Basophils Absolute: 52 cells/uL (ref 0–200)
Basophils Relative: 0.7 %
Eosinophils Absolute: 266 cells/uL (ref 15–500)
Eosinophils Relative: 3.6 %
HCT: 37.8 % (ref 35.0–45.0)
Hemoglobin: 12.6 g/dL (ref 11.7–15.5)
Lymphs Abs: 2390 cells/uL (ref 850–3900)
MCH: 28.3 pg (ref 27.0–33.0)
MCHC: 33.3 g/dL (ref 32.0–36.0)
MCV: 84.8 fL (ref 80.0–100.0)
MPV: 9.8 fL (ref 7.5–12.5)
Monocytes Relative: 7.5 %
Neutro Abs: 4137 cells/uL (ref 1500–7800)
Neutrophils Relative %: 55.9 %
Platelets: 390 10*3/uL (ref 140–400)
RBC: 4.46 10*6/uL (ref 3.80–5.10)
RDW: 13.8 % (ref 11.0–15.0)
Total Lymphocyte: 32.3 %
WBC: 7.4 10*3/uL (ref 3.8–10.8)

## 2020-06-29 LAB — LIPID PANEL
Cholesterol: 257 mg/dL — ABNORMAL HIGH (ref <200)
HDL: 51 mg/dL (ref >=50)
LDL Cholesterol (Calc): 186 mg/dL (calc) — ABNORMAL HIGH
Non-HDL Cholesterol (Calc): 206 mg/dL (calc) — ABNORMAL HIGH (ref <130)
Total CHOL/HDL Ratio: 5 (calc) — ABNORMAL HIGH (ref <5.0)
Triglycerides: 88 mg/dL (ref <150)

## 2020-06-29 LAB — HEMOGLOBIN A1C
Hgb A1c MFr Bld: 6.3 % of total Hgb — ABNORMAL HIGH (ref <5.7)
Mean Plasma Glucose: 134 mg/dL
eAG (mmol/L): 7.4 mmol/L

## 2020-06-29 LAB — HEPATITIS C ANTIBODY
Hepatitis C Ab: NONREACTIVE
SIGNAL TO CUT-OFF: 0.01 (ref <1.00)

## 2020-06-30 DIAGNOSIS — J301 Allergic rhinitis due to pollen: Secondary | ICD-10-CM | POA: Diagnosis not present

## 2020-06-30 DIAGNOSIS — J3081 Allergic rhinitis due to animal (cat) (dog) hair and dander: Secondary | ICD-10-CM | POA: Diagnosis not present

## 2020-06-30 DIAGNOSIS — J3089 Other allergic rhinitis: Secondary | ICD-10-CM | POA: Diagnosis not present

## 2020-07-05 MED FILL — LOSARTAN POTASSIUM 25 MG TA: 25 | 30 days supply | Qty: 30 | Fill #1

## 2020-07-06 DIAGNOSIS — J3089 Other allergic rhinitis: Secondary | ICD-10-CM | POA: Diagnosis not present

## 2020-07-06 DIAGNOSIS — J3081 Allergic rhinitis due to animal (cat) (dog) hair and dander: Secondary | ICD-10-CM | POA: Diagnosis not present

## 2020-07-06 DIAGNOSIS — J301 Allergic rhinitis due to pollen: Secondary | ICD-10-CM | POA: Diagnosis not present

## 2020-07-10 ENCOUNTER — Encounter: Payer: Self-pay | Admitting: *Deleted

## 2020-07-13 DIAGNOSIS — J3089 Other allergic rhinitis: Secondary | ICD-10-CM | POA: Diagnosis not present

## 2020-07-13 DIAGNOSIS — J301 Allergic rhinitis due to pollen: Secondary | ICD-10-CM | POA: Diagnosis not present

## 2020-07-13 DIAGNOSIS — J3081 Allergic rhinitis due to animal (cat) (dog) hair and dander: Secondary | ICD-10-CM | POA: Diagnosis not present

## 2020-07-17 ENCOUNTER — Encounter: Payer: Self-pay | Admitting: Family Medicine

## 2020-07-27 DIAGNOSIS — J3081 Allergic rhinitis due to animal (cat) (dog) hair and dander: Secondary | ICD-10-CM | POA: Diagnosis not present

## 2020-07-27 DIAGNOSIS — J301 Allergic rhinitis due to pollen: Secondary | ICD-10-CM | POA: Diagnosis not present

## 2020-07-27 DIAGNOSIS — J3089 Other allergic rhinitis: Secondary | ICD-10-CM | POA: Diagnosis not present

## 2020-08-03 DIAGNOSIS — J3089 Other allergic rhinitis: Secondary | ICD-10-CM | POA: Diagnosis not present

## 2020-08-03 DIAGNOSIS — J3081 Allergic rhinitis due to animal (cat) (dog) hair and dander: Secondary | ICD-10-CM | POA: Diagnosis not present

## 2020-08-03 DIAGNOSIS — J301 Allergic rhinitis due to pollen: Secondary | ICD-10-CM | POA: Diagnosis not present

## 2020-08-09 DIAGNOSIS — J3089 Other allergic rhinitis: Secondary | ICD-10-CM | POA: Diagnosis not present

## 2020-08-09 DIAGNOSIS — J3081 Allergic rhinitis due to animal (cat) (dog) hair and dander: Secondary | ICD-10-CM | POA: Diagnosis not present

## 2020-08-09 DIAGNOSIS — J301 Allergic rhinitis due to pollen: Secondary | ICD-10-CM | POA: Diagnosis not present

## 2020-08-10 DIAGNOSIS — J301 Allergic rhinitis due to pollen: Secondary | ICD-10-CM | POA: Diagnosis not present

## 2020-08-10 DIAGNOSIS — J3089 Other allergic rhinitis: Secondary | ICD-10-CM | POA: Diagnosis not present

## 2020-08-10 DIAGNOSIS — J3081 Allergic rhinitis due to animal (cat) (dog) hair and dander: Secondary | ICD-10-CM | POA: Diagnosis not present

## 2020-08-17 DIAGNOSIS — J301 Allergic rhinitis due to pollen: Secondary | ICD-10-CM | POA: Diagnosis not present

## 2020-08-17 DIAGNOSIS — J3089 Other allergic rhinitis: Secondary | ICD-10-CM | POA: Diagnosis not present

## 2020-08-17 DIAGNOSIS — J3081 Allergic rhinitis due to animal (cat) (dog) hair and dander: Secondary | ICD-10-CM | POA: Diagnosis not present

## 2020-08-21 ENCOUNTER — Other Ambulatory Visit (HOSPITAL_COMMUNITY): Payer: Self-pay

## 2020-08-21 MED FILL — Losartan Potassium Tab 25 MG: ORAL | 90 days supply | Qty: 90 | Fill #0 | Status: AC

## 2020-08-25 DIAGNOSIS — J3089 Other allergic rhinitis: Secondary | ICD-10-CM | POA: Diagnosis not present

## 2020-08-25 DIAGNOSIS — J3081 Allergic rhinitis due to animal (cat) (dog) hair and dander: Secondary | ICD-10-CM | POA: Diagnosis not present

## 2020-08-25 DIAGNOSIS — J301 Allergic rhinitis due to pollen: Secondary | ICD-10-CM | POA: Diagnosis not present

## 2020-09-07 DIAGNOSIS — J3081 Allergic rhinitis due to animal (cat) (dog) hair and dander: Secondary | ICD-10-CM | POA: Diagnosis not present

## 2020-09-07 DIAGNOSIS — J301 Allergic rhinitis due to pollen: Secondary | ICD-10-CM | POA: Diagnosis not present

## 2020-09-07 DIAGNOSIS — J3089 Other allergic rhinitis: Secondary | ICD-10-CM | POA: Diagnosis not present

## 2020-09-14 DIAGNOSIS — J301 Allergic rhinitis due to pollen: Secondary | ICD-10-CM | POA: Diagnosis not present

## 2020-09-14 DIAGNOSIS — J3081 Allergic rhinitis due to animal (cat) (dog) hair and dander: Secondary | ICD-10-CM | POA: Diagnosis not present

## 2020-09-14 DIAGNOSIS — J3089 Other allergic rhinitis: Secondary | ICD-10-CM | POA: Diagnosis not present

## 2020-09-21 DIAGNOSIS — J3089 Other allergic rhinitis: Secondary | ICD-10-CM | POA: Diagnosis not present

## 2020-09-21 DIAGNOSIS — J3081 Allergic rhinitis due to animal (cat) (dog) hair and dander: Secondary | ICD-10-CM | POA: Diagnosis not present

## 2020-09-21 DIAGNOSIS — J301 Allergic rhinitis due to pollen: Secondary | ICD-10-CM | POA: Diagnosis not present

## 2020-10-15 DIAGNOSIS — Z20822 Contact with and (suspected) exposure to covid-19: Secondary | ICD-10-CM | POA: Diagnosis not present

## 2020-10-16 ENCOUNTER — Ambulatory Visit: Payer: Self-pay | Admitting: *Deleted

## 2020-10-16 NOTE — Telephone Encounter (Signed)
testing positive for covid today via PCR testing. C/o sinus issues x 3 weeks and has been seeing allergy dr and treating symptoms with mucinex. Tested for covid via home test and was negative. Patient reports her husband started having symptoms and he tested positive for covid and she was also tested  and notified she was positive today . C/o nasal drainage and dry cough . Denies chest pain, difficulty breathing, fever, headache. Reviewed isolation guidelines with patient. My Chart visit scheduled for 10/19/20. Patient would like to know if she will be able to keep her F/U appt scheduled for 10/31/20 in office visit since she is positive for covid. Please advise. Care advise given. Patient verbalized understanding of care advise and to call back or go to [facility] or ED if symptoms worsen.

## 2020-10-16 NOTE — Telephone Encounter (Signed)
Reason for Disposition  [1] HIGH RISK for severe COVID complications (e.g., weak immune system, age > 23 years, obesity with BMI > 25, pregnant, chronic lung disease or other chronic medical condition) AND [2] COVID symptoms (e.g., cough, fever)  (Exceptions: Already seen by PCP and no new or worsening symptoms.)  Answer Assessment - Initial Assessment Questions 1. COVID-19 DIAGNOSIS: "Who made your COVID-19 diagnosis?" "Was it confirmed by a positive lab test or self-test?" If not diagnosed by a doctor (or NP/PA), ask "Are there lots of cases (community spread) where you live?" Note: See public health department website, if unsure.     Lab test positive covid  2. COVID-19 EXPOSURE: "Was there any known exposure to COVID before the symptoms began?" CDC Definition of close contact: within 6 feet (2 meters) for a total of 15 minutes or more over a 24-hour period.      Not sure  3. ONSET: "When did the COVID-19 symptoms start?"      No sure. Has had sinus symptoms since 3 weeks ago  4. WORST SYMPTOM: "What is your worst symptom?" (e.g., cough, fever, shortness of breath, muscle aches)     Sinus issues , dry cough 5. COUGH: "Do you have a cough?" If Yes, ask: "How bad is the cough?"       Yes dry cough 6. FEVER: "Do you have a fever?" If Yes, ask: "What is your temperature, how was it measured, and when did it start?"     no 7. RESPIRATORY STATUS: "Describe your breathing?" (e.g., shortness of breath, wheezing, unable to speak)      ok 8. BETTER-SAME-WORSE: "Are you getting better, staying the same or getting worse compared to yesterday?"  If getting worse, ask, "In what way?"     same 9. HIGH RISK DISEASE: "Do you have any chronic medical problems?" (e.g., asthma, heart or lung disease, weak immune system, obesity, etc.)     ashtma 10. VACCINE: "Have you had the COVID-19 vaccine?" If Yes, ask: "Which one, how many shots, when did you get it?"       Pfizer x 2 11. BOOSTER: "Have you received your  COVID-19 booster?" If Yes, ask: "Which one and when did you get it?"       no 12. PREGNANCY: "Is there any chance you are pregnant?" "When was your last menstrual period?"       na 13. OTHER SYMPTOMS: "Do you have any other symptoms?"  (e.g., chills, fatigue, headache, loss of smell or taste, muscle pain, sore throat)       Sinus issues , dry cough  14. O2 SATURATION MONITOR:  "Do you use an oxygen saturation monitor (pulse oximeter) at home?" If Yes, ask "What is your reading (oxygen level) today?" "What is your usual oxygen saturation reading?" (e.g., 95%)       na  Protocols used: Coronavirus (COVID-19) Diagnosed or Suspected-A-AH

## 2020-10-19 ENCOUNTER — Telehealth (INDEPENDENT_AMBULATORY_CARE_PROVIDER_SITE_OTHER): Payer: 59 | Admitting: Family Medicine

## 2020-10-19 ENCOUNTER — Encounter: Payer: Self-pay | Admitting: Family Medicine

## 2020-10-19 VITALS — Ht 60.0 in | Wt 177.0 lb

## 2020-10-19 DIAGNOSIS — J069 Acute upper respiratory infection, unspecified: Secondary | ICD-10-CM

## 2020-10-19 DIAGNOSIS — J4531 Mild persistent asthma with (acute) exacerbation: Secondary | ICD-10-CM | POA: Diagnosis not present

## 2020-10-19 DIAGNOSIS — U071 COVID-19: Secondary | ICD-10-CM | POA: Diagnosis not present

## 2020-10-19 MED ORDER — PREDNISONE 20 MG PO TABS: 40.0000 mg | ORAL_TABLET | Freq: Every day | ORAL | 0 refills | Status: AC

## 2020-10-19 NOTE — Progress Notes (Signed)
Name: Tracey Huff   MRN: [id number]    DOB: Jul 11, 1970   Date:10/19/2020       Progress Note  Subjective:    Chief Complaint  Chief Complaint  Patient presents with   Covid Positive    Cough, runny nose, throat tickle    I connected with  Tracey Huff  on 10/19/20 at  1:20 PM EDT by a video enabled telemedicine application and verified that I am speaking with the correct person using two identifiers.  I discussed the limitations of evaluation and management by telemedicine and the availability of in person appointments. The patient expressed understanding and agreed to proceed. Staff also discussed with the patient that there may be a patient responsible charge related to this service. Patient Location: home Provider Location: [facility] Additional Individuals present: none  HPI Pt presents COVID + She developed nasal sx and allergies 2 weeks ago - with increased nasal drainage and worse asthma sx Her husband was sx and ill this past weekend Pt did home test a few weeks ago per the advise of allergy specialialists and it was negative at that time  Per nurse note 3 d ago: testing positive for covid today via PCR testing. C/o sinus issues x 3 weeks and has been seeing allergy dr and treating symptoms with mucinex. Tested for covid via home test and was negative. Patient reports her husband started having symptoms and he tested positive for covid and she was also tested  and notified she was positive today . C/o nasal drainage and dry cough . Denies chest pain, difficulty breathing, fever, headache. Reviewed isolation guidelines with patient. My Chart visit scheduled for 10/19/20. Patient would like to know if she will be able to keep her F/U appt scheduled for 10/31/20 in office visit since she is positive for covid. Please advise. Care advise given. Patient verbalized understanding of care advise and to call back or go to [facility] or ED if symptoms worsen.  Pt today is using albuterol  rescue inhaler more often than her baseline.  Usually needs prednisone        Patient Active Problem List   Diagnosis Date Noted   CIN III (cervical intraepithelial neoplasia grade III) with severe dysplasia 08/11/2018   Atypical squamous cell changes of undetermined significance (ASCUS) on cervical cytology with positive high risk human papilloma virus (HPV) 06/29/2018   Prediabetes 06/24/2018   Class 1 obesity due to excess calories with serious comorbidity and body mass index (BMI) of 33.0 to 33.9 in adult 05/20/2018   Essential hypertension 05/20/2018   Hyperlipidemia 05/20/2018   Asthma 05/20/2018   Anxiety in acute stress reaction 05/20/2018   Status post hysterectomy 11/15/2014    Social History   Tobacco Use   Smoking status: Never   Smokeless tobacco: Never  Substance Use Topics   Alcohol use: Not Currently    Comment: occ     Current Outpatient Medications:    albuterol (PROVENTIL HFA;VENTOLIN HFA) 108 (90 BASE) MCG/ACT inhaler, Inhale 1-2 puffs into the lungs every 6 (six) hours as needed for wheezing or shortness of breath., Disp: , Rfl:    Ascorbic Acid (VITAMIN C) 100 MG tablet, Take 100 mg by mouth daily., Disp: , Rfl:    atorvastatin (LIPITOR) 20 MG tablet, Take 1 tablet (20 mg total) by mouth at bedtime., Disp: 90 tablet, Rfl: 3   azelastine (ASTELIN) 0.1 % nasal spray, 1-2 puffs in each nostril, Disp: , Rfl:    budesonide-formoterol (  SYMBICORT) 160-4.5 MCG/ACT inhaler, Inhale into the lungs., Disp: , Rfl:    busPIRone (BUSPAR) 7.5 MG tablet, Take 1 tablet once daily at night; may take 1 additional tablet daily if needed for anxiety., Disp: 180 tablet, Rfl: 1   Cholecalciferol 25 MCG (1000 UT) capsule, Take by mouth., Disp: , Rfl:    ELDERBERRY PO, Take by mouth., Disp: , Rfl:    EPINEPHrine 0.15 MG/0.15ML IJ injection, Inject into the skin. , Disp: , Rfl:    fluticasone (FLONASE) 50 MCG/ACT nasal spray, 1-2 sprays in each nostril, Disp: , Rfl:     hydrOXYzine (ATARAX/VISTARIL) 10 MG tablet, Take 1-2 tablets (10-20 mg total) by mouth 3 (three) times daily as needed for itching (rash)., Disp: 60 tablet, Rfl: 0   levocetirizine (XYZAL) 5 MG tablet, Take 5 mg by mouth every evening., Disp: , Rfl:    losartan (COZAAR) 25 MG tablet, TAKE 1 TABLET (25 MG TOTAL) BY MOUTH DAILY., Disp: 90 tablet, Rfl: 3   losartan (COZAAR) 25 MG tablet, TAKE 1/2 TABLET (12.5 MG TOTAL) BY MOUTH DAILY., Disp: 45 tablet, Rfl: 3   meclizine (ANTIVERT) 25 MG tablet, TAKE 1/2 TO 1 TABLET BY MOUTH THREE TIMES DAILY AS NEEDED FOR DIZZINESS., Disp: 30 tablet, Rfl: 0   montelukast (SINGULAIR) 10 MG tablet, Take by mouth., Disp: , Rfl:    Multiple Vitamin (MULTI-VITAMIN DAILY PO), Take by mouth., Disp: , Rfl:    Olopatadine HCl 0.2 % SOLN, 1 drop into affected eye, Disp: , Rfl:    ondansetron (ZOFRAN-ODT) 4 MG disintegrating tablet, TAKE 1 TABLET (4 MG TOTAL) BY MOUTH EVERY 8 (EIGHT) HOURS AS NEEDED FOR NAUSEA OR VOMITING., Disp: 10 tablet, Rfl: 0   tiZANidine (ZANAFLEX) 4 MG tablet, Take 0.5-1.5 tablets (2-6 mg total) by mouth every 8 (eight) hours as needed for muscle spasms., Disp: 30 tablet, Rfl: 0  Allergies  Allergen Reactions   Macadamia Nut Oil Shortness Of Breath   Apple Nausea And Vomiting   Carrot Oil     Can not cooked raw carrots   Fruit & Vegetable Daily [Nutritional Supplements] Nausea And Vomiting    Cannot tolerate apples, peaches, plums, and nectarines   Kiwi Extract Nausea And Vomiting   Peach [Prunus Persica] Nausea And Vomiting    I personally reviewed active problem list, medication list, allergies, family history, social history, health maintenance, notes from last encounter, lab results, imaging with the patient/caregiver today.   Review of Systems  Constitutional: Negative.  Negative for activity change, appetite change, chills, diaphoresis and fatigue.  HENT: Negative.    Eyes: Negative.   Respiratory:  Positive for cough and wheezing.    Cardiovascular: Negative.  Negative for chest pain, palpitations and leg swelling.  Gastrointestinal: Negative.   Endocrine: Negative.   Genitourinary: Negative.   Musculoskeletal: Negative.   Skin: Negative.   Allergic/Immunologic: Negative.   Neurological: Negative.   Hematological: Negative.   Psychiatric/Behavioral: Negative.    All other systems reviewed and are negative.    Objective:   Virtual encounter, vitals limited, only able to obtain the following Today's Vitals   10/19/20 1055  Weight: 177 lb (80.3 kg)  Height: 5' (1.524 m)   Body mass index is 34.57 kg/m. Nursing Note and Vital Signs reviewed.  Physical Exam Vitals and nursing note reviewed.  Constitutional:      General: She is not in acute distress.    Appearance: Normal appearance. She is obese. She is not ill-appearing, toxic-appearing or diaphoretic.  HENT:  Head: Normocephalic and atraumatic.     Right Ear: External ear normal.     Left Ear: External ear normal.     Nose: Nose normal.  Pulmonary:     Effort: Pulmonary effort is normal.     Comments: Normal WOB, normal respirations, no accessory muscle use, retractions, able to speak in full and complete sentences, no audible wheeze or stridor Neurological:     Mental Status: She is alert.    PE limited by virtual encounter  No results found for this or any previous visit (from the past 72 hour(s)).  Assessment and Plan:     ICD-10-CM   1. Mild persistent asthma with exacerbation  J45.31    continue daily inhaler, add systemic steroid burst and use albuterol PRN, f/up if not improving in the next week    2. Upper respiratory tract infection due to COVID-19 virus  U07.1    J06.9    covid positive on 6/13, difficult to say when sx onset was, allergy/nasal sx worse for a few weeks, RTW on d 6 (6/20) if improved and fever free       -Red flags and when to present for emergency care or RTC including fever >101.34F, chest pain, shortness of  breath, new/worsening/un-resolving symptoms, reviewed with patient at time of visit. Follow up and care instructions discussed and provided in AVS. - I discussed the assessment and treatment plan with the patient. The patient was provided an opportunity to ask questions and all were answered. The patient agreed with the plan and demonstrated an understanding of the instructions.  I provided 17 minutes of non-face-to-face time during this encounter.  Delsa Grana, PA-C 10/19/20 1:26 PM

## 2020-10-31 ENCOUNTER — Ambulatory Visit: Payer: 59 | Admitting: Family Medicine

## 2020-11-08 ENCOUNTER — Other Ambulatory Visit: Payer: Self-pay | Admitting: Family Medicine

## 2020-11-08 ENCOUNTER — Encounter: Payer: Self-pay | Admitting: Family Medicine

## 2020-11-08 ENCOUNTER — Ambulatory Visit: Payer: 59 | Admitting: Family Medicine

## 2020-11-08 ENCOUNTER — Other Ambulatory Visit: Payer: Self-pay

## 2020-11-08 ENCOUNTER — Other Ambulatory Visit (HOSPITAL_COMMUNITY): Payer: Self-pay

## 2020-11-08 ENCOUNTER — Telehealth: Payer: Self-pay | Admitting: Family Medicine

## 2020-11-08 VITALS — BP 118/82 | HR 98 | Temp 98.4°F | Resp 16 | Ht 60.0 in | Wt 178.5 lb

## 2020-11-08 DIAGNOSIS — E6609 Other obesity due to excess calories: Secondary | ICD-10-CM | POA: Diagnosis not present

## 2020-11-08 DIAGNOSIS — E785 Hyperlipidemia, unspecified: Secondary | ICD-10-CM | POA: Diagnosis not present

## 2020-11-08 DIAGNOSIS — J4541 Moderate persistent asthma with (acute) exacerbation: Secondary | ICD-10-CM | POA: Diagnosis not present

## 2020-11-08 DIAGNOSIS — I10 Essential (primary) hypertension: Secondary | ICD-10-CM

## 2020-11-08 DIAGNOSIS — R7303 Prediabetes: Secondary | ICD-10-CM

## 2020-11-08 DIAGNOSIS — Z6833 Body mass index (BMI) 33.0-33.9, adult: Secondary | ICD-10-CM

## 2020-11-08 DIAGNOSIS — J4531 Mild persistent asthma with (acute) exacerbation: Secondary | ICD-10-CM

## 2020-11-08 MED ORDER — PREDNISONE 20 MG PO TABS
40.0000 mg | ORAL_TABLET | Freq: Every day | ORAL | 0 refills | Status: AC
  Filled 2020-11-08: qty 10, 5d supply, fill #0

## 2020-11-08 NOTE — Assessment & Plan Note (Signed)
Not tolerating statin. Will recheck LDL today, consider switch to rosuvastatin if remains elevated.

## 2020-11-08 NOTE — Assessment & Plan Note (Signed)
At goal on current regimen. No changes made today.

## 2020-11-08 NOTE — Assessment & Plan Note (Signed)
With acute exacerbation after COVID a few weeks ago. Will provide steroid burst. Continue controller inhaler and albuterol prn. RTC if no better.

## 2020-11-08 NOTE — Assessment & Plan Note (Signed)
Contributing to prediabetes, HTN. Recommend weight loss through diet and exercise.
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
## 2020-11-08 NOTE — Telephone Encounter (Signed)
Pt is calling to ask if she would qualify to give herself nebulizer treatments? [phone]

## 2020-11-08 NOTE — Assessment & Plan Note (Signed)
Recheck a1c today.

## 2020-11-08 NOTE — Patient Instructions (Signed)
It was great to see you!  Our plans for today:  - Take the steroids for 5 days. Come back to see [place] if you are still having trouble after completing this. - Check with your insurance about dentists in the area that are in-network for you. - Check with the pharmacy about the shingles and pneumonia vaccines. - Check with your gynecologist to see when they want to repeat your pap smear. - Let us know if you want to proceed with a colonoscopy or Cologuard for colon cancer screening.  We are checking some labs today, we will release these results to your MyChart.  Take care and seek immediate care sooner if you develop any concerns.   Dr. Ky Barban

## 2020-11-09 ENCOUNTER — Other Ambulatory Visit: Payer: Self-pay | Admitting: Family Medicine

## 2020-11-09 LAB — HEMOGLOBIN A1C
Hgb A1c MFr Bld: 6.4 % of total Hgb — ABNORMAL HIGH (ref <5.7)
Mean Plasma Glucose: 137 mg/dL
eAG (mmol/L): 7.6 mmol/L

## 2020-11-09 LAB — LDL CHOLESTEROL, DIRECT: Direct LDL: 180 mg/dL — ABNORMAL HIGH (ref <100)

## 2020-11-09 NOTE — Telephone Encounter (Signed)
Patient states insurance will cover nebulizer and would like rx sent in. Patient states PCP suggested sending rx to "[facility]" patient would like a follow up call when script is sent.

## 2020-11-09 NOTE — Telephone Encounter (Signed)
Patient checking with ins to see if they cover.  If they do she will request RX

## 2020-11-09 NOTE — Telephone Encounter (Signed)
Ok so I will put in DME order for the machine and will have leisa sign can you send in the medicine to her pharmacy.  She does need both.  Thanks

## 2020-11-09 NOTE — Telephone Encounter (Signed)
Fax Number, [phone]

## 2020-11-09 NOTE — Addendum Note (Signed)
Addended by: Docia Furl on: 11/09/2020 04:34 PM   Modules accepted: Orders

## 2020-11-10 ENCOUNTER — Other Ambulatory Visit (HOSPITAL_COMMUNITY): Payer: Self-pay

## 2020-11-10 ENCOUNTER — Telehealth: Payer: Self-pay

## 2020-11-10 DIAGNOSIS — J4541 Moderate persistent asthma with (acute) exacerbation: Secondary | ICD-10-CM

## 2020-11-10 MED ORDER — IPRATROPIUM-ALBUTEROL 0.5-2.5 (3) MG/3ML IN SOLN
3.0000 mL | Freq: Four times a day (QID) | RESPIRATORY_TRACT | 1 refills | Status: DC | PRN
  Filled 2020-11-10: qty 360, 30d supply, fill #0

## 2020-11-10 MED ORDER — IPRATROPIUM-ALBUTEROL 0.5-2.5 (3) MG/3ML IN SOLN: 3.0000 mL | Freq: Four times a day (QID) | RESPIRATORY_TRACT | 1 refills | Status: DC | PRN

## 2020-11-10 NOTE — Telephone Encounter (Signed)
Pt was following up on her request  below for her nebulizer to be sent to [facility] supply, pt wanted a call back.

## 2020-11-10 NOTE — Telephone Encounter (Signed)
Left detailed vm, neb solution sent to pharmacy and nebulizer sent to [facility]

## 2020-11-10 NOTE — Telephone Encounter (Signed)
Copied from [place] [phone]. Topic: General - Other >> Nov 10, 2020 12:10 PM Tessa Lerner A wrote: Reason for CRM: Patient has made contact requesting that a prescription for a nebulizer be submitted to   [facility] -  [address] [place], [place] [address].  phone [phone]. Fax [phone]  Please contact further if needed

## 2020-11-10 NOTE — Telephone Encounter (Signed)
Can you send in neb medicine to phamacy?

## 2020-11-14 ENCOUNTER — Other Ambulatory Visit (HOSPITAL_COMMUNITY): Payer: Self-pay

## 2020-11-14 ENCOUNTER — Other Ambulatory Visit: Payer: Self-pay | Admitting: Family Medicine

## 2020-11-14 MED ORDER — ALBUTEROL SULFATE (2.5 MG/3ML) 0.083% IN NEBU
2.5000 mg | INHALATION_SOLUTION | Freq: Four times a day (QID) | RESPIRATORY_TRACT | 1 refills | Status: DC | PRN
  Filled 2020-11-14: qty 150, 13d supply, fill #0

## 2020-11-23 ENCOUNTER — Telehealth: Payer: Self-pay

## 2020-11-23 NOTE — Telephone Encounter (Signed)
Pt coming in on 11-27-2020 at 2:40

## 2020-11-23 NOTE — Telephone Encounter (Signed)
Copied from [place] [phone]. Topic: Referral - Question >> Nov 23, 2020 12:41 PM Pawlus, Brayton Layman A wrote: Reason for CRM: Pt stated she found a lump in her left breast, pt wanted to know if she could get a referral for this or if she needs to come in an see Delsa Grana, please advise.

## 2020-11-27 ENCOUNTER — Other Ambulatory Visit: Payer: Self-pay

## 2020-11-27 ENCOUNTER — Encounter: Payer: Self-pay | Admitting: Family Medicine

## 2020-11-27 ENCOUNTER — Ambulatory Visit: Payer: 59 | Admitting: Family Medicine

## 2020-11-27 VITALS — BP 132/80 | HR 88 | Temp 98.2°F | Resp 16 | Ht 60.0 in | Wt 179.8 lb

## 2020-11-27 DIAGNOSIS — N63 Unspecified lump in unspecified breast: Secondary | ICD-10-CM

## 2020-11-27 NOTE — Progress Notes (Signed)
    SUBJECTIVE:   CHIEF COMPLAINT / HPI:   BREAST LUMP - L breast at top - noted during self breast examination last week - not tender  Redness: no Swelling: no Trauma: no trauma Breastfeeding: no Associated with menstral cycle: no Nipple discharge: no Previous mammogram: yes, 04/2020 with b/l benign microcalcifications. Annual screening recommended.   OBJECTIVE:   BP 132/80   Pulse 88   Temp 98.2 F (36.8 C)   Resp 16   Ht 5' (1.524 m)   Wt 179 lb 12.8 oz (81.6 kg)   LMP 11/15/2014   SpO2 99%   BMI 35.11 kg/m   Breasts: right breast normal without mass, skin or nipple changes or axillary nodes. L breast with palpable fibrous tissue to superior and lateral border of areola.    ASSESSMENT/PLAN:   Breast lump Exam most consistent with fibrous tissue however given h/o abnormal mammograms, will f/u with diagnostic mammogram.    Myles Gip, DO

## 2020-11-27 NOTE — Patient Instructions (Signed)
It was great to see you!  Our plans for today:  - We are getting a mammogram. We will let you know these results.    Take care and seek immediate care sooner if you develop any concerns.   Dr. Ky Barban

## 2020-11-28 ENCOUNTER — Other Ambulatory Visit (HOSPITAL_COMMUNITY): Payer: Self-pay

## 2020-11-28 ENCOUNTER — Other Ambulatory Visit: Payer: Self-pay | Admitting: Allergy

## 2020-11-28 ENCOUNTER — Ambulatory Visit
Admission: RE | Admit: 2020-11-28 | Discharge: 2020-11-28 | Disposition: A | Discharge status: Home / Self Care | Payer: 59 | Source: Ambulatory Visit | Attending: Allergy | Admitting: Allergy

## 2020-11-28 DIAGNOSIS — J209 Acute bronchitis, unspecified: Secondary | ICD-10-CM | POA: Diagnosis not present

## 2020-11-28 DIAGNOSIS — J301 Allergic rhinitis due to pollen: Secondary | ICD-10-CM | POA: Diagnosis not present

## 2020-11-28 DIAGNOSIS — J4530 Mild persistent asthma, uncomplicated: Secondary | ICD-10-CM | POA: Diagnosis not present

## 2020-11-28 DIAGNOSIS — J3089 Other allergic rhinitis: Secondary | ICD-10-CM | POA: Diagnosis not present

## 2020-11-28 MED ORDER — BUDESONIDE-FORMOTEROL FUMARATE 160-4.5 MCG/ACT IN AERO
2.0000 | INHALATION_SPRAY | Freq: Two times a day (BID) | RESPIRATORY_TRACT | 6 refills | Status: DC
  Filled 2020-11-28: qty 10.2, 30d supply, fill #0

## 2020-11-28 MED ORDER — LEVOCETIRIZINE DIHYDROCHLORIDE 5 MG PO TABS
5.0000 mg | ORAL_TABLET | Freq: Every evening | ORAL | 6 refills | Status: DC
  Filled 2020-11-28: qty 30, 30d supply, fill #0

## 2020-11-28 MED ORDER — MONTELUKAST SODIUM 10 MG PO TABS
10.0000 mg | ORAL_TABLET | Freq: Every evening | ORAL | 6 refills | Status: DC
  Filled 2020-11-28: qty 30, 30d supply, fill #0

## 2020-11-28 MED ORDER — PREDNISONE 10 MG (48) PO TBPK
ORAL_TABLET | ORAL | 0 refills | Status: DC
  Filled 2020-11-28: qty 48, 12d supply, fill #0

## 2020-11-28 MED ORDER — ALBUTEROL SULFATE HFA 108 (90 BASE) MCG/ACT IN AERS
INHALATION_SPRAY | RESPIRATORY_TRACT | 0 refills | Status: DC
  Filled 2020-11-28: qty 18, 25d supply, fill #0

## 2020-11-29 ENCOUNTER — Other Ambulatory Visit (HOSPITAL_COMMUNITY): Payer: Self-pay

## 2020-11-29 MED ORDER — AZELASTINE HCL 0.1 % NA SOLN
1.0000 | Freq: Two times a day (BID) | NASAL | 3 refills | Status: DC
  Filled 2020-11-29: qty 30, 25d supply, fill #0

## 2020-11-29 MED ORDER — FLUTICASONE PROPIONATE 50 MCG/ACT NA SUSP
1.0000 | Freq: Every day | NASAL | 3 refills | Status: DC
  Filled 2020-11-29: qty 16, 30d supply, fill #0

## 2020-12-13 ENCOUNTER — Encounter: Payer: Self-pay | Admitting: Family Medicine

## 2020-12-22 DIAGNOSIS — J301 Allergic rhinitis due to pollen: Secondary | ICD-10-CM | POA: Diagnosis not present

## 2020-12-22 DIAGNOSIS — J3089 Other allergic rhinitis: Secondary | ICD-10-CM | POA: Diagnosis not present

## 2020-12-22 DIAGNOSIS — J3081 Allergic rhinitis due to animal (cat) (dog) hair and dander: Secondary | ICD-10-CM | POA: Diagnosis not present

## 2020-12-27 ENCOUNTER — Telehealth: Payer: Self-pay

## 2020-12-27 NOTE — Telephone Encounter (Signed)
Copied from [place] [phone]. Topic: Appointment Scheduling - Scheduling Inquiry for Clinic >> Dec 27, 2020 10:22 AM Tracey Huff wrote: Reason for CRM: Pt called in stating she was suppose to have a [place] on left breast and she has still heard anything and wants someone to reach out to her to see about getting that taken care of, please advise.

## 2020-12-27 NOTE — Telephone Encounter (Signed)
Pt given number to setup her appt.

## 2020-12-29 DIAGNOSIS — J3089 Other allergic rhinitis: Secondary | ICD-10-CM | POA: Diagnosis not present

## 2020-12-29 DIAGNOSIS — J3081 Allergic rhinitis due to animal (cat) (dog) hair and dander: Secondary | ICD-10-CM | POA: Diagnosis not present

## 2020-12-29 DIAGNOSIS — J301 Allergic rhinitis due to pollen: Secondary | ICD-10-CM | POA: Diagnosis not present

## 2021-01-04 ENCOUNTER — Other Ambulatory Visit (HOSPITAL_COMMUNITY): Payer: Self-pay

## 2021-01-04 ENCOUNTER — Other Ambulatory Visit: Payer: Self-pay | Admitting: Family Medicine

## 2021-01-04 DIAGNOSIS — J301 Allergic rhinitis due to pollen: Secondary | ICD-10-CM | POA: Diagnosis not present

## 2021-01-04 DIAGNOSIS — J3089 Other allergic rhinitis: Secondary | ICD-10-CM | POA: Diagnosis not present

## 2021-01-04 DIAGNOSIS — J3081 Allergic rhinitis due to animal (cat) (dog) hair and dander: Secondary | ICD-10-CM | POA: Diagnosis not present

## 2021-01-04 DIAGNOSIS — N63 Unspecified lump in unspecified breast: Secondary | ICD-10-CM

## 2021-01-04 MED FILL — Losartan Potassium Tab 25 MG: ORAL | 90 days supply | Qty: 90 | Fill #1 | Status: AC

## 2021-01-11 DIAGNOSIS — J301 Allergic rhinitis due to pollen: Secondary | ICD-10-CM | POA: Diagnosis not present

## 2021-01-11 DIAGNOSIS — J3081 Allergic rhinitis due to animal (cat) (dog) hair and dander: Secondary | ICD-10-CM | POA: Diagnosis not present

## 2021-01-11 DIAGNOSIS — J3089 Other allergic rhinitis: Secondary | ICD-10-CM | POA: Diagnosis not present

## 2021-01-18 DIAGNOSIS — J301 Allergic rhinitis due to pollen: Secondary | ICD-10-CM | POA: Diagnosis not present

## 2021-01-18 DIAGNOSIS — J3089 Other allergic rhinitis: Secondary | ICD-10-CM | POA: Diagnosis not present

## 2021-01-18 DIAGNOSIS — J3081 Allergic rhinitis due to animal (cat) (dog) hair and dander: Secondary | ICD-10-CM | POA: Diagnosis not present

## 2021-01-25 DIAGNOSIS — J301 Allergic rhinitis due to pollen: Secondary | ICD-10-CM | POA: Diagnosis not present

## 2021-01-25 DIAGNOSIS — J3081 Allergic rhinitis due to animal (cat) (dog) hair and dander: Secondary | ICD-10-CM | POA: Diagnosis not present

## 2021-01-25 DIAGNOSIS — J3089 Other allergic rhinitis: Secondary | ICD-10-CM | POA: Diagnosis not present

## 2021-02-01 DIAGNOSIS — J301 Allergic rhinitis due to pollen: Secondary | ICD-10-CM | POA: Diagnosis not present

## 2021-02-01 DIAGNOSIS — J3081 Allergic rhinitis due to animal (cat) (dog) hair and dander: Secondary | ICD-10-CM | POA: Diagnosis not present

## 2021-02-01 DIAGNOSIS — J3089 Other allergic rhinitis: Secondary | ICD-10-CM | POA: Diagnosis not present

## 2021-02-08 DIAGNOSIS — J3081 Allergic rhinitis due to animal (cat) (dog) hair and dander: Secondary | ICD-10-CM | POA: Diagnosis not present

## 2021-02-08 DIAGNOSIS — J301 Allergic rhinitis due to pollen: Secondary | ICD-10-CM | POA: Diagnosis not present

## 2021-02-08 DIAGNOSIS — J3089 Other allergic rhinitis: Secondary | ICD-10-CM | POA: Diagnosis not present

## 2021-02-15 DIAGNOSIS — J3089 Other allergic rhinitis: Secondary | ICD-10-CM | POA: Diagnosis not present

## 2021-02-15 DIAGNOSIS — J301 Allergic rhinitis due to pollen: Secondary | ICD-10-CM | POA: Diagnosis not present

## 2021-02-15 DIAGNOSIS — J3081 Allergic rhinitis due to animal (cat) (dog) hair and dander: Secondary | ICD-10-CM | POA: Diagnosis not present

## 2021-02-19 ENCOUNTER — Other Ambulatory Visit: Payer: Self-pay | Admitting: Family Medicine

## 2021-02-19 ENCOUNTER — Telehealth: Payer: Self-pay

## 2021-02-19 DIAGNOSIS — N63 Unspecified lump in unspecified breast: Secondary | ICD-10-CM

## 2021-02-19 NOTE — Telephone Encounter (Signed)
Copied from [place] [phone]. Topic: General - Other >> Feb 19, 2021 10:47 AM Bayard Beaver wrote: Reason for [id number] called in about breast exam saying order hasnt been signed off on. Please call back with status

## 2021-02-22 DIAGNOSIS — J3081 Allergic rhinitis due to animal (cat) (dog) hair and dander: Secondary | ICD-10-CM | POA: Diagnosis not present

## 2021-02-22 DIAGNOSIS — J3089 Other allergic rhinitis: Secondary | ICD-10-CM | POA: Diagnosis not present

## 2021-02-22 DIAGNOSIS — J301 Allergic rhinitis due to pollen: Secondary | ICD-10-CM | POA: Diagnosis not present

## 2021-03-01 ENCOUNTER — Other Ambulatory Visit: Payer: 59

## 2021-03-01 ENCOUNTER — Other Ambulatory Visit (HOSPITAL_COMMUNITY): Payer: Self-pay

## 2021-03-01 DIAGNOSIS — J301 Allergic rhinitis due to pollen: Secondary | ICD-10-CM | POA: Diagnosis not present

## 2021-03-01 DIAGNOSIS — J4530 Mild persistent asthma, uncomplicated: Secondary | ICD-10-CM | POA: Diagnosis not present

## 2021-03-01 DIAGNOSIS — J3081 Allergic rhinitis due to animal (cat) (dog) hair and dander: Secondary | ICD-10-CM | POA: Diagnosis not present

## 2021-03-01 DIAGNOSIS — H1045 Other chronic allergic conjunctivitis: Secondary | ICD-10-CM | POA: Diagnosis not present

## 2021-03-01 DIAGNOSIS — J3089 Other allergic rhinitis: Secondary | ICD-10-CM | POA: Diagnosis not present

## 2021-03-01 MED ORDER — LEVOCETIRIZINE DIHYDROCHLORIDE 5 MG PO TABS
5.0000 mg | ORAL_TABLET | Freq: Every evening | ORAL | 6 refills | Status: DC
  Filled 2021-03-01: qty 30, 30d supply, fill #0

## 2021-03-01 MED ORDER — FLUTICASONE PROPIONATE 50 MCG/ACT NA SUSP
1.0000 | Freq: Every day | NASAL | 6 refills | Status: DC
  Filled 2021-03-01: qty 16, 30d supply, fill #0

## 2021-03-01 MED ORDER — MONTELUKAST SODIUM 10 MG PO TABS
10.0000 mg | ORAL_TABLET | Freq: Every evening | ORAL | 6 refills | Status: DC
  Filled 2021-03-01: qty 30, 30d supply, fill #0

## 2021-03-01 MED ORDER — ALBUTEROL SULFATE HFA 108 (90 BASE) MCG/ACT IN AERS
1.0000 | INHALATION_SPRAY | RESPIRATORY_TRACT | 0 refills | Status: DC | PRN
  Filled 2021-03-01: qty 18, 25d supply, fill #0

## 2021-03-01 MED ORDER — OLOPATADINE HCL 0.2 % OP SOLN
1.0000 [drp] | Freq: Every day | OPHTHALMIC | 6 refills | Status: DC
  Filled 2021-03-01: qty 2.5, 25d supply, fill #0

## 2021-03-01 MED ORDER — AZELASTINE HCL 0.1 % NA SOLN
1.0000 | Freq: Two times a day (BID) | NASAL | 6 refills | Status: DC
  Filled 2021-03-01: qty 30, 25d supply, fill #0

## 2021-03-01 MED ORDER — TRELEGY ELLIPTA 200-62.5-25 MCG/ACT IN AEPB
1.0000 | INHALATION_SPRAY | Freq: Every day | RESPIRATORY_TRACT | 6 refills | Status: DC
  Filled 2021-03-01: qty 60, 30d supply, fill #0

## 2021-03-02 ENCOUNTER — Ambulatory Visit
Admission: RE | Admit: 2021-03-02 | Discharge: 2021-03-02 | Disposition: A | Discharge status: Home / Self Care | Payer: 59 | Source: Ambulatory Visit | Attending: Family Medicine | Admitting: Family Medicine

## 2021-03-02 ENCOUNTER — Other Ambulatory Visit: Payer: Self-pay

## 2021-03-02 DIAGNOSIS — N63 Unspecified lump in unspecified breast: Secondary | ICD-10-CM | POA: Diagnosis not present

## 2021-03-02 DIAGNOSIS — R922 Inconclusive mammogram: Secondary | ICD-10-CM | POA: Diagnosis not present

## 2021-03-05 ENCOUNTER — Other Ambulatory Visit: Payer: Self-pay | Admitting: Family Medicine

## 2021-03-05 DIAGNOSIS — R928 Other abnormal and inconclusive findings on diagnostic imaging of breast: Secondary | ICD-10-CM

## 2021-03-05 DIAGNOSIS — R922 Inconclusive mammogram: Secondary | ICD-10-CM

## 2021-03-08 DIAGNOSIS — J3089 Other allergic rhinitis: Secondary | ICD-10-CM | POA: Diagnosis not present

## 2021-03-08 DIAGNOSIS — J3081 Allergic rhinitis due to animal (cat) (dog) hair and dander: Secondary | ICD-10-CM | POA: Diagnosis not present

## 2021-03-08 DIAGNOSIS — J301 Allergic rhinitis due to pollen: Secondary | ICD-10-CM | POA: Diagnosis not present

## 2021-03-09 ENCOUNTER — Other Ambulatory Visit: Payer: Self-pay

## 2021-03-09 ENCOUNTER — Ambulatory Visit
Admission: RE | Admit: 2021-03-09 | Discharge: 2021-03-09 | Disposition: A | Discharge status: Home / Self Care | Payer: 59 | Source: Ambulatory Visit | Attending: Family Medicine | Admitting: Family Medicine

## 2021-03-09 DIAGNOSIS — R922 Inconclusive mammogram: Secondary | ICD-10-CM

## 2021-03-09 DIAGNOSIS — N6325 Unspecified lump in the left breast, overlapping quadrants: Secondary | ICD-10-CM | POA: Diagnosis not present

## 2021-03-09 DIAGNOSIS — C50812 Malignant neoplasm of overlapping sites of left female breast: Secondary | ICD-10-CM | POA: Diagnosis not present

## 2021-03-09 DIAGNOSIS — R928 Other abnormal and inconclusive findings on diagnostic imaging of breast: Secondary | ICD-10-CM | POA: Diagnosis not present

## 2021-03-09 HISTORY — PX: BREAST BIOPSY: SHX20

## 2021-03-13 DIAGNOSIS — C50919 Malignant neoplasm of unspecified site of unspecified female breast: Secondary | ICD-10-CM

## 2021-03-15 ENCOUNTER — Encounter: Payer: Self-pay | Admitting: *Deleted

## 2021-03-15 DIAGNOSIS — J3081 Allergic rhinitis due to animal (cat) (dog) hair and dander: Secondary | ICD-10-CM | POA: Diagnosis not present

## 2021-03-15 DIAGNOSIS — J301 Allergic rhinitis due to pollen: Secondary | ICD-10-CM | POA: Diagnosis not present

## 2021-03-15 DIAGNOSIS — J3089 Other allergic rhinitis: Secondary | ICD-10-CM | POA: Diagnosis not present

## 2021-03-21 ENCOUNTER — Encounter: Payer: Self-pay | Admitting: Surgery

## 2021-03-21 ENCOUNTER — Encounter: Payer: Self-pay | Admitting: Internal Medicine

## 2021-03-21 ENCOUNTER — Inpatient Hospital Stay: Payer: 59

## 2021-03-21 ENCOUNTER — Ambulatory Visit: Payer: 59 | Admitting: Surgery

## 2021-03-21 ENCOUNTER — Other Ambulatory Visit: Payer: Self-pay

## 2021-03-21 ENCOUNTER — Inpatient Hospital Stay: Payer: 59 | Attending: Internal Medicine | Admitting: Internal Medicine

## 2021-03-21 ENCOUNTER — Other Ambulatory Visit: Payer: Self-pay | Admitting: Surgery

## 2021-03-21 VITALS — BP 129/82 | HR 80 | Temp 98.9°F | Ht 60.0 in | Wt 179.8 lb

## 2021-03-21 DIAGNOSIS — C50919 Malignant neoplasm of unspecified site of unspecified female breast: Secondary | ICD-10-CM

## 2021-03-21 DIAGNOSIS — Z170 Estrogen receptor positive status [ER+]: Secondary | ICD-10-CM

## 2021-03-21 DIAGNOSIS — J45909 Unspecified asthma, uncomplicated: Secondary | ICD-10-CM | POA: Diagnosis not present

## 2021-03-21 DIAGNOSIS — J4530 Mild persistent asthma, uncomplicated: Secondary | ICD-10-CM | POA: Insufficient documentation

## 2021-03-21 DIAGNOSIS — J301 Allergic rhinitis due to pollen: Secondary | ICD-10-CM | POA: Insufficient documentation

## 2021-03-21 DIAGNOSIS — J3081 Allergic rhinitis due to animal (cat) (dog) hair and dander: Secondary | ICD-10-CM | POA: Insufficient documentation

## 2021-03-21 DIAGNOSIS — C50412 Malignant neoplasm of upper-outer quadrant of left female breast: Secondary | ICD-10-CM

## 2021-03-21 DIAGNOSIS — J309 Allergic rhinitis, unspecified: Secondary | ICD-10-CM | POA: Insufficient documentation

## 2021-03-21 DIAGNOSIS — H1045 Other chronic allergic conjunctivitis: Secondary | ICD-10-CM | POA: Insufficient documentation

## 2021-03-21 DIAGNOSIS — Z9079 Acquired absence of other genital organ(s): Secondary | ICD-10-CM | POA: Insufficient documentation

## 2021-03-21 DIAGNOSIS — I10 Essential (primary) hypertension: Secondary | ICD-10-CM | POA: Diagnosis not present

## 2021-03-21 DIAGNOSIS — Z8042 Family history of malignant neoplasm of prostate: Secondary | ICD-10-CM | POA: Insufficient documentation

## 2021-03-21 LAB — SURGICAL PATHOLOGY

## 2021-03-21 NOTE — H&P (View-Only) (Signed)
Patient ID: Tracey Huff, female   DOB: 29-Dec-1970, 50 y.o.   MRN: [id number]  HPI Tracey Huff is a 50 y.o. female seen in consultation at the request of Dr. Tish Men for newly diagnosed breast cancer.  She started feeling a lump on her left breast on August this year. This mass prompted ultrasound and mammogram that I have personally reviewed on imaging studies she had bilateral calcifications with a very ill-defined lesion on the left breast.  This is actually more clear on physical exam than on imaging studies.  She is subsequently underwent ultrasound core needle biopsy.  Please note that I have personally discussed the case with the proceduralist Dr. Shelly Bombard, she again stated that the images were not clear-cut and not well-defined.  She basically mainly relied on palpation for the biopsy and there was no specific center that she was able to target.  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 She had had prior biopsies on the breast side that were benign She now experiences some mild pain.  Pain worsening with pressure or tight clothing.. She is very functional and is able to perform more than 4 METS of activity. History of abdominal hysterectomy with bilateral salpingectomy 6 years ago. She had 2 prior breast biopsies on the right breast with benign pathology. No family history of breast cancer.  Uncle and first cousin with prostate cancer. This is a 11 G3, P2 M1 HPI  Past Medical History:  Diagnosis Date   Allergy    Asthma    Breast cancer ([place])    INVASIVE MAMMARY CARCINOMA- left   Fibroid uterus 11/15/2014   Headache    Hypertension    Menorrhagia with irregular cycle 11/15/2014   Shortness of breath dyspnea     Past Surgical History:  Procedure Laterality Date   ABDOMINAL HYSTERECTOMY N/A 11/15/2014   Procedure: HYSTERECTOMY ABDOMINAL/BILATERAL SALPINGECTOMY ;  Surgeon: Will Bonnet, MD;  Location: [facility] ORS;  Service:  Gynecology;  Laterality: N/A;   BREAST BIOPSY Right    2019 negative   BREAST BIOPSY Left 03/09/2021   u/s biopsy, 12-1 o'clock, VENUS" clip-path pending   CESAREAN SECTION     CYSTOSCOPY N/A 11/15/2014   Procedure: CYSTOSCOPY;  Surgeon: Will Bonnet, MD;  Location: [facility] OR[facility];  Service: Gynecology;  Laterality: N/A;   DILATION AND CURETTAGE OF UTERUS     GANGLION CYST EXCISION Left    UTERINE FIBROID SURGERY      Family History  Problem Relation Age of Onset   Congestive Heart Failure Mother    Diabetes Mother    Hypertension Mother    Diabetes Maternal Grandmother    Congestive Heart Failure Maternal Grandfather    Breast cancer Neg Hx     Social History Social History   Tobacco Use   Smoking status: Never   Smokeless tobacco: Never  Vaping Use   Vaping Use: Never used  Substance Use Topics   Alcohol use: Not Currently    Comment: occ   Drug use: No    Allergies  Allergen Reactions   Macadamia Nut Oil Shortness Of Breath   Apple Nausea And Vomiting   Carrot Oil     Can not cooked raw carrots   Fruit & Vegetable Daily [Nutritional Supplements] Nausea And Vomiting    Cannot tolerate apples, peaches, plums, and nectarines   Kiwi Extract Nausea And Vomiting   Peach [Prunus Persica] Nausea And Vomiting    Current Outpatient  Medications  Medication Sig Dispense Refill   albuterol (PROVENTIL HFA) 108 (90 Base) MCG/ACT inhaler Inhale 1 to 2 puffs every 4-6 hours as needed for cough/wheeze 18 g 0   Ascorbic Acid (VITAMIN C) 100 MG tablet Take 100 mg by mouth daily.     atorvastatin (LIPITOR) 20 MG tablet Take 1 tablet (20 mg total) by mouth at bedtime. 90 tablet 3   AUVI-Q 0.3 MG/0.3ML SOAJ injection Inject into the muscle as directed.     azelastine (ASTELIN) 0.1 % nasal spray Place 1-2 sprays into both nostrils 2 (two) times daily. 30 mL 6   busPIRone (BUSPAR) 7.5 MG tablet Take 1 tablet once daily at night; may take 1 additional tablet daily if needed for  anxiety. 180 tablet 1   Cholecalciferol 25 MCG (1000 UT) capsule Take by mouth.     ELDERBERRY PO Take by mouth.     fluticasone (FLONASE) 50 MCG/ACT nasal spray Place 1-2 sprays into both nostrils daily. 16 g 6   Fluticasone-Umeclidin-Vilant (TRELEGY ELLIPTA) 200-62.5-25 MCG/ACT AEPB Inhale 1 puff into the lungs daily. 60 each 6   hydrOXYzine (ATARAX/VISTARIL) 10 MG tablet Take 1-2 tablets (10-20 mg total) by mouth 3 (three) times daily as needed for itching (rash). 60 tablet 0   ipratropium-albuterol (DUONEB) 0.5-2.5 (3) MG/3ML SOLN Take 3 mLs by nebulization every 6 (six) hours as needed (wheeze, SOB, cough variant asthma). 360 mL 1   levocetirizine (XYZAL) 5 MG tablet Take 1 tablet (5 mg total) by mouth every evening. 30 tablet 6   losartan (COZAAR) 25 MG tablet TAKE 1 TABLET (25 MG TOTAL) BY MOUTH DAILY. 90 tablet 3   montelukast (SINGULAIR) 10 MG tablet Take 1 tablet (10 mg total) by mouth every evening. 30 tablet 6   Multiple Vitamin (MULTI-VITAMIN DAILY PO) Take by mouth.     Olopatadine HCl (PATADAY) 0.2 % SOLN Place 1 drop into affected eye daily. 2.5 mL 6   Olopatadine HCl 0.2 % SOLN 1 drop into affected eye     No current facility-administered medications for this visit.     Review of Systems Full ROS  was asked and was negative except for the information on the HPI  Physical Exam Blood pressure 129/82, pulse 80, temperature 98.9 F (37.2 C), temperature source Oral, height 5' (1.524 m), weight 179 lb 12.8 oz (81.6 kg), last menstrual period 11/15/2014, SpO2 97 %. CONSTITUTIONAL: NAD. EYES: Pupils are equal, round, Sclera are non-icteric. EARS, NOSE, MOUTH AND THROAT: She is wearing a mask. Hearing is intact to voice. LYMPH NODES:  Lymph nodes in the neck are normal. RESPIRATORY:  Lungs are clear. There is normal respiratory effort, with equal breath sounds bilaterally, and without pathologic use of accessory muscles. CARDIOVASCULAR: Heart is regular without murmurs, gallops,  or rubs. BREAST: There is evidence of an ill-defined mass located from 11:00 to 2:00 on the left breast this measures approximately 5 x 2-1/2 cm.  As I stated it is ill-defined.  There is no evidence of nipple retraction there is no evidence of skin compromise.  There right breast is completely normal.  There is no evidence of lymphadenopathy. GI: The abdomen is  soft, nontender, and nondistended. There are no palpable masses. There is no hepatosplenomegaly. There are normal bowel sounds in all quadrants. GU: Rectal deferred.   MUSCULOSKELETAL: Normal muscle strength and tone. No cyanosis or edema.   SKIN: Turgor is good and there are no pathologic skin lesions or ulcers. NEUROLOGIC: Motor and sensation is  grossly normal. Cranial nerves are grossly intact. PSYCH:  Oriented to person, place and time. Affect is normal.  Data Reviewed  I have personally reviewed the patient's imaging, laboratory findings and medical records.    Assessment/Plan 50 year old female with recently diagnosed left invasive mammary carcinoma located in the upper outer quadrant.  We unfortunately cannot accurately assess the extent of this lesion based on imaging studies.  On physical exam there appears to be also a vague mass.  It is not typical that the whole mass on physical correspond to invasive carcinoma.  On my initial exam we talked with her regarding options for breast conservation therapy sentinel lymph node biopsy in addition to the radiation therapy versus mastectomy mastectomy sentinel lymph node biopsy. At that time she wanted to do breast conservation therapy if at all possible.  She is very anxious about it and wishes to schedule this as soon as possible.  I went ahead and tentatively place her on the schedule for next week for a left RF guided lumpectomy with sentinel lymph node biopsy.  I also had an extensive discussion with our breast radiology who feels that an MRI is required.  I have also arrange for this  to take place early next week.  She does understand that the MRI may change how we proceed.  I had an extensive discussion about the proposed surgery.  Risks, benefits and possible complications including but not limited to: Bleeding, infection seroma, margin positivity specifically given her large mass on physical exam.  Also discussed with her potential for changing therapy after MRI. Final plan is to tentatively schedule her for left lumpectomy next week as well as an MRI before the lumpectomy.  She understands that we may have to rearrange surgical intervention depending on the MRI studies.  Please also note that I spent over 80 minutes in this encounter including personally reviewing available imaging studies, coordinating her care, extensive discussion with breast radiologist and performing appropriate documentation. A copy of this report was sent to the referring provider.  Caroleen Hamman, MD FACS General Surgeon 03/21/2021, 3:25 PM
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
## 2021-03-21 NOTE — Assessment & Plan Note (Addendum)
#  Invasive mammary carcinoma-2 lesions approximately 1.5 cm contiguous as noted on ultrasound.  ER/PR positive HER2 negative.  Left axilla clinically negative.   # I had a long discussion with the patient in general regarding the treatment options of breast cancer including-surgery; adjuvant radiation; role of adjuvant systemic therapy including-chemotherapy antihormone therapy.  # Patient will likely need lumpectomy with sentinel lymph node evaluation; followed by radiation.  Patient is awaiting evaluation with surgery later today, Dr.Pabon.   # Decision regarding chemotherapy based on final surgical pathology/gene assay. Patient will benefit from antihormone therapy.  I discussed the potential benefits of each option; and also potential downsides in detail.  # Asthma- well controlled.   # Genetic testing: Patient meets the criteria for genetic testing.  We will make a referral to genetic counselor.  Thank you, Ms.Lucio Edward for allowing me to participate in the care of your pleasant patient. Please do not hesitate to contact me with questions or concerns in the interim.  # DISPOSITION:  # genetic counseling re; breast cancer # follow up TBD- Dr.B

## 2021-03-21 NOTE — Patient Instructions (Addendum)

## 2021-03-21 NOTE — Progress Notes (Signed)
Patient ID: Tracey Huff, female   DOB: 29-Dec-1970, 50 y.o.   MRN: [id number]  HPI Tracey Huff is a 50 y.o. female seen in consultation at the request of Dr. Tish Men for newly diagnosed breast cancer.  She started feeling a lump on her left breast on August this year. This mass prompted ultrasound and mammogram that I have personally reviewed on imaging studies she had bilateral calcifications with a very ill-defined lesion on the left breast.  This is actually more clear on physical exam than on imaging studies.  She is subsequently underwent ultrasound core needle biopsy.  Please note that I have personally discussed the case with the proceduralist Dr. Shelly Bombard, she again stated that the images were not clear-cut and not well-defined.  She basically mainly relied on palpation for the biopsy and there was no specific center that she was able to target.  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 She had had prior biopsies on the breast side that were benign She now experiences some mild pain.  Pain worsening with pressure or tight clothing.. She is very functional and is able to perform more than 4 METS of activity. History of abdominal hysterectomy with bilateral salpingectomy 6 years ago. She had 2 prior breast biopsies on the right breast with benign pathology. No family history of breast cancer.  Uncle and first cousin with prostate cancer. This is a 11 G3, P2 M1 HPI  Past Medical History:  Diagnosis Date   Allergy    Asthma    Breast cancer ([place])    INVASIVE MAMMARY CARCINOMA- left   Fibroid uterus 11/15/2014   Headache    Hypertension    Menorrhagia with irregular cycle 11/15/2014   Shortness of breath dyspnea     Past Surgical History:  Procedure Laterality Date   ABDOMINAL HYSTERECTOMY N/A 11/15/2014   Procedure: HYSTERECTOMY ABDOMINAL/BILATERAL SALPINGECTOMY ;  Surgeon: Will Bonnet, MD;  Location: [facility] ORS;  Service:  Gynecology;  Laterality: N/A;   BREAST BIOPSY Right    2019 negative   BREAST BIOPSY Left 03/09/2021   u/s biopsy, 12-1 o'clock, VENUS" clip-path pending   CESAREAN SECTION     CYSTOSCOPY N/A 11/15/2014   Procedure: CYSTOSCOPY;  Surgeon: Will Bonnet, MD;  Location: [facility] ORS;  Service: Gynecology;  Laterality: N/A;   DILATION AND CURETTAGE OF UTERUS     GANGLION CYST EXCISION Left    UTERINE FIBROID SURGERY      Family History  Problem Relation Age of Onset   Congestive Heart Failure Mother    Diabetes Mother    Hypertension Mother    Diabetes Maternal Grandmother    Congestive Heart Failure Maternal Grandfather    Breast cancer Neg Hx     Social History Social History   Tobacco Use   Smoking status: Never   Smokeless tobacco: Never  Vaping Use   Vaping Use: Never used  Substance Use Topics   Alcohol use: Not Currently    Comment: occ   Drug use: No    Allergies  Allergen Reactions   Macadamia Nut Oil Shortness Of Breath   Apple Nausea And Vomiting   Carrot Oil     Can not cooked raw carrots   Fruit & Vegetable Daily [Nutritional Supplements] Nausea And Vomiting    Cannot tolerate apples, peaches, plums, and nectarines   Kiwi Extract Nausea And Vomiting   Peach [Prunus Persica] Nausea And Vomiting    Current Outpatient  Medications  Medication Sig Dispense Refill   albuterol (PROVENTIL HFA) 108 (90 Base) MCG/ACT inhaler Inhale 1 to 2 puffs every 4-6 hours as needed for cough/wheeze 18 g 0   Ascorbic Acid (VITAMIN C) 100 MG tablet Take 100 mg by mouth daily.     atorvastatin (LIPITOR) 20 MG tablet Take 1 tablet (20 mg total) by mouth at bedtime. 90 tablet 3   AUVI-Q 0.3 MG/0.3ML SOAJ injection Inject into the muscle as directed.     azelastine (ASTELIN) 0.1 % nasal spray Place 1-2 sprays into both nostrils 2 (two) times daily. 30 mL 6   busPIRone (BUSPAR) 7.5 MG tablet Take 1 tablet once daily at night; may take 1 additional tablet daily if needed for  anxiety. 180 tablet 1   Cholecalciferol 25 MCG (1000 UT) capsule Take by mouth.     ELDERBERRY PO Take by mouth.     fluticasone (FLONASE) 50 MCG/ACT nasal spray Place 1-2 sprays into both nostrils daily. 16 g 6   Fluticasone-Umeclidin-Vilant (TRELEGY ELLIPTA) 200-62.5-25 MCG/ACT AEPB Inhale 1 puff into the lungs daily. 60 each 6   hydrOXYzine (ATARAX/VISTARIL) 10 MG tablet Take 1-2 tablets (10-20 mg total) by mouth 3 (three) times daily as needed for itching (rash). 60 tablet 0   ipratropium-albuterol (DUONEB) 0.5-2.5 (3) MG/3ML SOLN Take 3 mLs by nebulization every 6 (six) hours as needed (wheeze, SOB, cough variant asthma). 360 mL 1   levocetirizine (XYZAL) 5 MG tablet Take 1 tablet (5 mg total) by mouth every evening. 30 tablet 6   losartan (COZAAR) 25 MG tablet TAKE 1 TABLET (25 MG TOTAL) BY MOUTH DAILY. 90 tablet 3   montelukast (SINGULAIR) 10 MG tablet Take 1 tablet (10 mg total) by mouth every evening. 30 tablet 6   Multiple Vitamin (MULTI-VITAMIN DAILY PO) Take by mouth.     Olopatadine HCl (PATADAY) 0.2 % SOLN Place 1 drop into affected eye daily. 2.5 mL 6   Olopatadine HCl 0.2 % SOLN 1 drop into affected eye     No current facility-administered medications for this visit.     Review of Systems Full ROS  was asked and was negative except for the information on the HPI  Physical Exam Blood pressure 129/82, pulse 80, temperature 98.9 F (37.2 C), temperature source Oral, height 5' (1.524 m), weight 179 lb 12.8 oz (81.6 kg), last menstrual period 11/15/2014, SpO2 97 %. CONSTITUTIONAL: NAD. EYES: Pupils are equal, round, Sclera are non-icteric. EARS, NOSE, MOUTH AND THROAT: She is wearing a mask. Hearing is intact to voice. LYMPH NODES:  Lymph nodes in the neck are normal. RESPIRATORY:  Lungs are clear. There is normal respiratory effort, with equal breath sounds bilaterally, and without pathologic use of accessory muscles. CARDIOVASCULAR: Heart is regular without murmurs, gallops,  or rubs. BREAST: There is evidence of an ill-defined mass located from 11:00 to 2:00 on the left breast this measures approximately 5 x 2-1/2 cm.  As I stated it is ill-defined.  There is no evidence of nipple retraction there is no evidence of skin compromise.  There right breast is completely normal.  There is no evidence of lymphadenopathy. GI: The abdomen is  soft, nontender, and nondistended. There are no palpable masses. There is no hepatosplenomegaly. There are normal bowel sounds in all quadrants. GU: Rectal deferred.   MUSCULOSKELETAL: Normal muscle strength and tone. No cyanosis or edema.   SKIN: Turgor is good and there are no pathologic skin lesions or ulcers. NEUROLOGIC: Motor and sensation is  grossly normal. Cranial nerves are grossly intact. PSYCH:  Oriented to person, place and time. Affect is normal.  Data Reviewed  I have personally reviewed the patient's imaging, laboratory findings and medical records.    Assessment/Plan 50 year old female with recently diagnosed left invasive mammary carcinoma located in the upper outer quadrant.  We unfortunately cannot accurately assess the extent of this lesion based on imaging studies.  On physical exam there appears to be also a vague mass.  It is not typical that the whole mass on physical correspond to invasive carcinoma.  On my initial exam we talked with her regarding options for breast conservation therapy sentinel lymph node biopsy in addition to the radiation therapy versus mastectomy mastectomy sentinel lymph node biopsy. At that time she wanted to do breast conservation therapy if at all possible.  She is very anxious about it and wishes to schedule this as soon as possible.  I went ahead and tentatively place her on the schedule for next week for a left RF guided lumpectomy with sentinel lymph node biopsy.  I also had an extensive discussion with our breast radiology who feels that an MRI is required.  I have also arrange for this  to take place early next week.  She does understand that the MRI may change how we proceed.  I had an extensive discussion about the proposed surgery.  Risks, benefits and possible complications including but not limited to: Bleeding, infection seroma, margin positivity specifically given her large mass on physical exam.  Also discussed with her potential for changing therapy after MRI. Final plan is to tentatively schedule her for left lumpectomy next week as well as an MRI before the lumpectomy.  She understands that we may have to rearrange surgical intervention depending on the MRI studies.  Please also note that I spent over 80 minutes in this encounter including personally reviewing available imaging studies, coordinating her care, extensive discussion with breast radiologist and performing appropriate documentation. A copy of this report was sent to the referring provider.  Caroleen Hamman, MD FACS General Surgeon 03/21/2021, 3:25 PM

## 2021-03-22 ENCOUNTER — Telehealth: Payer: Self-pay | Admitting: Surgery

## 2021-03-22 ENCOUNTER — Telehealth: Payer: Self-pay

## 2021-03-22 ENCOUNTER — Other Ambulatory Visit: Payer: Self-pay

## 2021-03-22 ENCOUNTER — Ambulatory Visit
Admission: RE | Admit: 2021-03-22 | Discharge: 2021-03-22 | Disposition: A | Discharge status: Home / Self Care | Payer: 59 | Source: Ambulatory Visit | Attending: Surgery | Admitting: Surgery

## 2021-03-22 DIAGNOSIS — C50919 Malignant neoplasm of unspecified site of unspecified female breast: Secondary | ICD-10-CM | POA: Insufficient documentation

## 2021-03-22 DIAGNOSIS — Z170 Estrogen receptor positive status [ER+]: Secondary | ICD-10-CM

## 2021-03-22 DIAGNOSIS — C50812 Malignant neoplasm of overlapping sites of left female breast: Secondary | ICD-10-CM | POA: Diagnosis not present

## 2021-03-22 NOTE — Telephone Encounter (Signed)
Received call from Dr.Pabon to place order for Bilateral Breast MRI-order placed and staff message sent to [facility] @ [place]-cc'd Dr.Pabon and Dr.Jarosz.

## 2021-03-22 NOTE — Telephone Encounter (Signed)
Patient returns call, she is now informed of all dates regarding her surgery and was given her arrival time of 11:30 am on 03/28/21 as she will be having SLN bx done prior to her surgery that day with Dr. Dahlia Byes.

## 2021-03-22 NOTE — Telephone Encounter (Signed)
Outgoing call is made, left message for patient to call. Please inform patient of the following:  Pre-Admission date/time, COVID Testing date and Surgery date.  Surgery Date: 03/28/21 Preadmission Testing Date: 03/26/21 (phone 1p-5p) Covid Testing Date: Not needed.   Also patient will need to call at [phone], between 1-3:00pm the day before surgery, to find out what time to arrive for surgery.

## 2021-03-26 ENCOUNTER — Other Ambulatory Visit: Payer: Self-pay

## 2021-03-26 ENCOUNTER — Ambulatory Visit
Admission: RE | Admit: 2021-03-26 | Discharge: 2021-03-26 | Disposition: A | Discharge status: Home / Self Care | Payer: 59 | Source: Ambulatory Visit | Attending: Surgery | Admitting: Surgery

## 2021-03-26 ENCOUNTER — Encounter
Admission: RE | Admit: 2021-03-26 | Discharge: 2021-03-26 | Disposition: A | Discharge status: Home / Self Care | Payer: 59 | Source: Ambulatory Visit | Attending: Surgery | Admitting: Surgery

## 2021-03-26 VITALS — Ht 60.0 in | Wt 179.0 lb

## 2021-03-26 DIAGNOSIS — Z170 Estrogen receptor positive status [ER+]: Secondary | ICD-10-CM | POA: Diagnosis not present

## 2021-03-26 DIAGNOSIS — Z01812 Encounter for preprocedural laboratory examination: Secondary | ICD-10-CM

## 2021-03-26 DIAGNOSIS — R7303 Prediabetes: Secondary | ICD-10-CM | POA: Diagnosis not present

## 2021-03-26 DIAGNOSIS — I10 Essential (primary) hypertension: Secondary | ICD-10-CM | POA: Diagnosis not present

## 2021-03-26 DIAGNOSIS — C50812 Malignant neoplasm of overlapping sites of left female breast: Secondary | ICD-10-CM | POA: Diagnosis not present

## 2021-03-26 DIAGNOSIS — C50912 Malignant neoplasm of unspecified site of left female breast: Secondary | ICD-10-CM | POA: Diagnosis not present

## 2021-03-26 HISTORY — DX: Pneumonia, unspecified organism: J18.9

## 2021-03-26 HISTORY — DX: Hyperlipidemia, unspecified: E78.5

## 2021-03-26 HISTORY — DX: Prediabetes: R73.03

## 2021-03-26 MED ORDER — GADOBUTROL 1 MMOL/ML IV SOLN
8.0000 mL | Freq: Once | INTRAVENOUS | Status: AC | PRN
  Administered 2021-03-26: 8 mL via INTRAVENOUS

## 2021-03-26 NOTE — Patient Instructions (Addendum)
Your procedure is scheduled on: Wednesday, November 23 Report to the Registration Desk on the 1st floor of the [facility] at 11:30  REMEMBER: Instructions that are not followed completely may result in serious medical risk, up to and including death; or upon the discretion of your surgeon and anesthesiologist your surgery may need to be rescheduled.  Do not eat food after midnight the night before surgery.  No gum chewing, lozengers or hard candies.  You may however, drink CLEAR liquids up to 2 hours before you are scheduled to arrive for your surgery. Do not drink anything within 2 hours of your scheduled arrival time.  Clear liquids include: - water  - apple juice without pulp - gatorade (not RED, PURPLE, OR BLUE) - black coffee or tea (Do NOT add milk or creamers to the coffee or tea) Do NOT drink anything that is not on this list.  TAKE THESE MEDICATIONS THE MORNING OF SURGERY WITH A SIP OF WATER:  Duoneb nebulizer Trelegy ellipta inhaler  Use inhalers on the day of surgery and bring your albuterol inhaler to the hospital.  One week prior to surgery: Stop Anti-inflammatories (NSAIDS) such as Advil, Aleve, Ibuprofen, Motrin, Naproxen, Naprosyn and Aspirin based products such as Excedrin, Goodys Powder, BC Powder. Stop ANY OVER THE COUNTER supplements until after surgery. Stop elderberry, vitamin C. You may however, continue to take Tylenol if needed for pain up until the day of surgery.  No Alcohol for 24 hours before or after surgery.  No Smoking including e-cigarettes for 24 hours prior to surgery.  No chewable tobacco products for at least 6 hours prior to surgery.  No nicotine patches on the day of surgery.  Do not use any "recreational" drugs for at least a week prior to your surgery.  Please be advised that the combination of cocaine and anesthesia may have negative outcomes, up to and including death. If you test positive for cocaine, your surgery will be  cancelled.  On the morning of surgery brush your teeth with toothpaste and water, you may rinse your mouth with mouthwash if you wish. Do not swallow any toothpaste or mouthwash.  Use CHG Soap as directed on instruction sheet.  Do not wear jewelry, make-up, hairpins, clips or nail polish.  Do not wear lotions, powders, deodorants or perfumes.   Do not shave body from the neck down 48 hours prior to surgery just in case you cut yourself which could leave a site for infection.  Also, freshly shaved skin may become irritated if using the CHG soap.  Contact lenses, hearing aids and dentures may not be worn into surgery.  Do not bring valuables to the hospital. [facility] is not responsible for any missing/lost belongings or valuables.   Notify your doctor if there is any change in your medical condition (cold, fever, infection).  Wear comfortable clothing (specific to your surgery type) to the hospital.  After surgery, you can help prevent lung complications by doing breathing exercises.  Take deep breaths and cough every 1-2 hours. Your doctor may order a device called an Incentive Spirometer to help you take deep breaths.  If you are being discharged the day of surgery, you will not be allowed to drive home. You will need a responsible adult (18 years or older) to drive you home and stay with you that night.   If you are taking public transportation, you will need to have a responsible adult (18 years or older) with you. Please confirm with  your physician that it is acceptable to use public transportation.   Please call the [facility] Dept[facility] at [phone] if you have any questions about these instructions.  Surgery Visitation Policy:  Patients undergoing a surgery or procedure may have one family member or support person with them as long as that person is not COVID-19 positive or experiencing its symptoms.  That person may remain in the waiting area during the  procedure and may rotate out with other people.

## 2021-03-27 ENCOUNTER — Encounter: Payer: Self-pay | Admitting: Urgent Care

## 2021-03-27 ENCOUNTER — Encounter
Admission: RE | Admit: 2021-03-27 | Discharge: 2021-03-27 | Disposition: A | Discharge status: Home / Self Care | Payer: 59 | Source: Ambulatory Visit | Attending: Surgery | Admitting: Surgery

## 2021-03-27 DIAGNOSIS — R7303 Prediabetes: Secondary | ICD-10-CM

## 2021-03-27 DIAGNOSIS — I10 Essential (primary) hypertension: Secondary | ICD-10-CM | POA: Insufficient documentation

## 2021-03-27 DIAGNOSIS — Z01818 Encounter for other preprocedural examination: Secondary | ICD-10-CM | POA: Insufficient documentation

## 2021-03-27 DIAGNOSIS — J3089 Other allergic rhinitis: Secondary | ICD-10-CM | POA: Diagnosis not present

## 2021-03-27 DIAGNOSIS — J3081 Allergic rhinitis due to animal (cat) (dog) hair and dander: Secondary | ICD-10-CM | POA: Diagnosis not present

## 2021-03-27 DIAGNOSIS — J301 Allergic rhinitis due to pollen: Secondary | ICD-10-CM | POA: Diagnosis not present

## 2021-03-27 DIAGNOSIS — Z01812 Encounter for preprocedural laboratory examination: Secondary | ICD-10-CM

## 2021-03-27 LAB — CBC
HCT: 37.3 % (ref 36.0–46.0)
Hemoglobin: 12.5 g/dL (ref 12.0–15.0)
MCH: 27.8 pg (ref 26.0–34.0)
MCHC: 33.5 g/dL (ref 30.0–36.0)
MCV: 83.1 fL (ref 80.0–100.0)
Platelets: 474 10*3/uL — ABNORMAL HIGH (ref 150–400)
RBC: 4.49 MIL/uL (ref 3.87–5.11)
RDW: 13.6 % (ref 11.5–15.5)
WBC: 8.9 10*3/uL (ref 4.0–10.5)
nRBC: 0 % (ref 0.0–0.2)

## 2021-03-27 LAB — BASIC METABOLIC PANEL
Anion gap: 5 (ref 5–15)
BUN: 10 mg/dL (ref 6–20)
CO2: 26 mmol/L (ref 22–32)
Calcium: 9.1 mg/dL (ref 8.9–10.3)
Chloride: 105 mmol/L (ref 98–111)
Creatinine, Ser: 0.78 mg/dL (ref 0.44–1.00)
GFR, Estimated: >60 mL/min (ref >60)
Glucose, Bld: 118 mg/dL — ABNORMAL HIGH (ref 70–99)
Potassium: 3.4 mmol/L — ABNORMAL LOW (ref 3.5–5.1)
Sodium: 136 mmol/L (ref 135–145)

## 2021-03-27 MED ORDER — CHLORHEXIDINE GLUCONATE 0.12 % MT SOLN: 15.0000 mL | Freq: Once | OROMUCOSAL | Status: AC

## 2021-03-27 MED ORDER — CELECOXIB 200 MG PO CAPS: 200.0000 mg | ORAL_CAPSULE | ORAL | Status: AC

## 2021-03-27 MED ORDER — CHLORHEXIDINE GLUCONATE CLOTH 2 % EX PADS
6.0000 | MEDICATED_PAD | Freq: Once | CUTANEOUS | Status: AC
  Administered 2021-03-28: 6 via TOPICAL

## 2021-03-27 MED ORDER — ORAL CARE MOUTH RINSE: 15.0000 mL | Freq: Once | OROMUCOSAL | Status: AC

## 2021-03-27 MED ORDER — LACTATED RINGERS IV SOLN: INTRAVENOUS | Status: DC

## 2021-03-27 MED ORDER — CEFAZOLIN SODIUM-DEXTROSE 2-4 GM/100ML-% IV SOLN
2.0000 g | INTRAVENOUS | Status: AC
  Administered 2021-03-28: 2 g via INTRAVENOUS

## 2021-03-27 MED ORDER — FAMOTIDINE 20 MG PO TABS: 20.0000 mg | ORAL_TABLET | Freq: Once | ORAL | Status: AC

## 2021-03-27 MED ORDER — CHLORHEXIDINE GLUCONATE CLOTH 2 % EX PADS: 6.0000 | MEDICATED_PAD | Freq: Once | CUTANEOUS | Status: DC

## 2021-03-27 MED ORDER — ACETAMINOPHEN 500 MG PO TABS: 1000.0000 mg | ORAL_TABLET | ORAL | Status: AC

## 2021-03-27 MED ORDER — GABAPENTIN 300 MG PO CAPS: 300.0000 mg | ORAL_CAPSULE | ORAL | Status: AC

## 2021-03-28 ENCOUNTER — Encounter
Admission: RE | Disposition: A | Discharge status: Home / Self Care | Payer: Self-pay | Source: Home / Self Care | Attending: Surgery

## 2021-03-28 ENCOUNTER — Encounter: Payer: Self-pay | Admitting: Surgery

## 2021-03-28 ENCOUNTER — Ambulatory Visit
Admission: RE | Admit: 2021-03-28 | Discharge: 2021-03-28 | Disposition: A | Discharge status: Home / Self Care | Payer: 59 | Attending: Surgery | Admitting: Surgery

## 2021-03-28 ENCOUNTER — Ambulatory Visit
Admission: RE | Admit: 2021-03-28 | Discharge: 2021-03-28 | Disposition: A | Discharge status: Home / Self Care | Payer: 59 | Source: Ambulatory Visit | Attending: Surgery | Admitting: Surgery

## 2021-03-28 ENCOUNTER — Ambulatory Visit: Payer: 59 | Admitting: Anesthesiology

## 2021-03-28 ENCOUNTER — Other Ambulatory Visit: Payer: Self-pay

## 2021-03-28 DIAGNOSIS — C50212 Malignant neoplasm of upper-inner quadrant of left female breast: Secondary | ICD-10-CM | POA: Insufficient documentation

## 2021-03-28 DIAGNOSIS — C50412 Malignant neoplasm of upper-outer quadrant of left female breast: Secondary | ICD-10-CM | POA: Diagnosis not present

## 2021-03-28 DIAGNOSIS — Z90710 Acquired absence of both cervix and uterus: Secondary | ICD-10-CM | POA: Insufficient documentation

## 2021-03-28 DIAGNOSIS — Z170 Estrogen receptor positive status [ER+]: Secondary | ICD-10-CM | POA: Insufficient documentation

## 2021-03-28 DIAGNOSIS — R7303 Prediabetes: Secondary | ICD-10-CM | POA: Insufficient documentation

## 2021-03-28 DIAGNOSIS — C50912 Malignant neoplasm of unspecified site of left female breast: Secondary | ICD-10-CM | POA: Diagnosis not present

## 2021-03-28 DIAGNOSIS — J45909 Unspecified asthma, uncomplicated: Secondary | ICD-10-CM | POA: Diagnosis not present

## 2021-03-28 DIAGNOSIS — I10 Essential (primary) hypertension: Secondary | ICD-10-CM | POA: Insufficient documentation

## 2021-03-28 DIAGNOSIS — E785 Hyperlipidemia, unspecified: Secondary | ICD-10-CM | POA: Insufficient documentation

## 2021-03-28 DIAGNOSIS — Z9079 Acquired absence of other genital organ(s): Secondary | ICD-10-CM | POA: Insufficient documentation

## 2021-03-28 DIAGNOSIS — C50919 Malignant neoplasm of unspecified site of unspecified female breast: Secondary | ICD-10-CM

## 2021-03-28 HISTORY — PX: BREAST LUMPECTOMY,RADIO FREQ LOCALIZER,AXILLARY SENTINEL LYMPH NODE BIOPSY: SHX6900

## 2021-03-28 SURGERY — BREAST LUMPECTOMY,RADIO FREQ LOCALIZER,AXILLARY SENTINEL LYMPH NODE BIOPSY
Anesthesia: General | Laterality: Left

## 2021-03-28 MED ORDER — ONDANSETRON HCL 4 MG/2ML IJ SOLN
INTRAMUSCULAR | Status: DC | PRN
  Administered 2021-03-28: 4 mg via INTRAVENOUS

## 2021-03-28 MED ORDER — LACTATED RINGERS IV SOLN: INTRAVENOUS | Status: DC | PRN

## 2021-03-28 MED ORDER — ISOSULFAN BLUE 1 % ~~LOC~~ SOLN
SUBCUTANEOUS | Status: AC
  Filled 2021-03-28: qty 5

## 2021-03-28 MED ORDER — ISOSULFAN BLUE 1 % ~~LOC~~ SOLN
SUBCUTANEOUS | Status: DC | PRN
  Administered 2021-03-28: 5 mL via SUBCUTANEOUS

## 2021-03-28 MED ORDER — OXYCODONE HCL 5 MG PO TABS
ORAL_TABLET | ORAL | Status: AC
  Administered 2021-03-28: 5 mg via ORAL
  Filled 2021-03-28: qty 1

## 2021-03-28 MED ORDER — CEFAZOLIN SODIUM-DEXTROSE 2-4 GM/100ML-% IV SOLN
INTRAVENOUS | Status: AC
  Filled 2021-03-28: qty 100

## 2021-03-28 MED ORDER — ACETAMINOPHEN 10 MG/ML IV SOLN: 1000.0000 mg | Freq: Once | INTRAVENOUS | Status: DC | PRN

## 2021-03-28 MED ORDER — OXYCODONE HCL 5 MG/5ML PO SOLN: 5.0000 mg | Freq: Once | ORAL | Status: AC | PRN

## 2021-03-28 MED ORDER — MIDAZOLAM HCL 2 MG/2ML IJ SOLN
INTRAMUSCULAR | Status: DC | PRN
  Administered 2021-03-28: 2 mg via INTRAVENOUS

## 2021-03-28 MED ORDER — FENTANYL CITRATE (PF) 100 MCG/2ML IJ SOLN
INTRAMUSCULAR | Status: DC | PRN
  Administered 2021-03-28 (×2): 50 ug via INTRAVENOUS

## 2021-03-28 MED ORDER — HYDROCODONE-ACETAMINOPHEN 5-325 MG PO TABS: 1.0000 | ORAL_TABLET | Freq: Four times a day (QID) | ORAL | 0 refills | Status: DC | PRN

## 2021-03-28 MED ORDER — METHYLENE BLUE 0.5 % INJ SOLN
INTRAVENOUS | Status: AC
  Filled 2021-03-28: qty 10

## 2021-03-28 MED ORDER — FENTANYL CITRATE (PF) 100 MCG/2ML IJ SOLN
INTRAMUSCULAR | Status: AC
  Administered 2021-03-28: 50 ug via INTRAVENOUS
  Filled 2021-03-28: qty 2

## 2021-03-28 MED ORDER — TECHNETIUM TC 99M TILMANOCEPT KIT
1.0000 | PACK | Freq: Once | INTRAVENOUS | Status: AC
  Administered 2021-03-28: 1.107 via INTRADERMAL

## 2021-03-28 MED ORDER — OXYCODONE HCL 5 MG PO TABS
5.0000 mg | ORAL_TABLET | Freq: Once | ORAL | Status: AC | PRN
  Administered 2021-03-28: 5 mg via ORAL

## 2021-03-28 MED ORDER — FAMOTIDINE 20 MG PO TABS
ORAL_TABLET | ORAL | Status: AC
  Administered 2021-03-28: 20 mg via ORAL
  Filled 2021-03-28: qty 1

## 2021-03-28 MED ORDER — CHLORHEXIDINE GLUCONATE 0.12 % MT SOLN
OROMUCOSAL | Status: AC
  Administered 2021-03-28: 15 mL via OROMUCOSAL
  Filled 2021-03-28: qty 15

## 2021-03-28 MED ORDER — FENTANYL CITRATE (PF) 100 MCG/2ML IJ SOLN
25.0000 ug | INTRAMUSCULAR | Status: DC | PRN
  Administered 2021-03-28 (×2): 50 ug via INTRAVENOUS

## 2021-03-28 MED ORDER — PROPOFOL 10 MG/ML IV BOLUS
INTRAVENOUS | Status: DC | PRN
  Administered 2021-03-28: 200 mg via INTRAVENOUS
  Administered 2021-03-28: 100 mg via INTRAVENOUS

## 2021-03-28 MED ORDER — ONDANSETRON HCL 4 MG/2ML IJ SOLN: 4.0000 mg | Freq: Once | INTRAMUSCULAR | Status: DC | PRN

## 2021-03-28 MED ORDER — DEXMEDETOMIDINE (PRECEDEX) IN NS 20 MCG/5ML (4 MCG/ML) IV SYRINGE
PREFILLED_SYRINGE | INTRAVENOUS | Status: DC | PRN
  Administered 2021-03-28: 4 ug via INTRAVENOUS
  Administered 2021-03-28: 8 ug via INTRAVENOUS

## 2021-03-28 MED ORDER — ACETAMINOPHEN 500 MG PO TABS
ORAL_TABLET | ORAL | Status: AC
  Administered 2021-03-28: 1000 mg via ORAL
  Filled 2021-03-28: qty 2

## 2021-03-28 MED ORDER — GABAPENTIN 300 MG PO CAPS
ORAL_CAPSULE | ORAL | Status: AC
  Administered 2021-03-28: 300 mg via ORAL
  Filled 2021-03-28: qty 1

## 2021-03-28 MED ORDER — FENTANYL CITRATE (PF) 100 MCG/2ML IJ SOLN
INTRAMUSCULAR | Status: AC
  Filled 2021-03-28: qty 2

## 2021-03-28 MED ORDER — OXYCODONE HCL 5 MG PO TABS
ORAL_TABLET | ORAL | Status: AC
  Filled 2021-03-28: qty 1

## 2021-03-28 MED ORDER — EPHEDRINE SULFATE 50 MG/ML IJ SOLN
INTRAMUSCULAR | Status: DC | PRN
  Administered 2021-03-28: 10 mg via INTRAVENOUS

## 2021-03-28 MED ORDER — CELECOXIB 200 MG PO CAPS
ORAL_CAPSULE | ORAL | Status: AC
  Administered 2021-03-28: 200 mg via ORAL
  Filled 2021-03-28: qty 1

## 2021-03-28 MED ORDER — LACTATED RINGERS IV SOLN: INTRAVENOUS | Status: DC

## 2021-03-28 MED ORDER — MIDAZOLAM HCL 2 MG/2ML IJ SOLN
INTRAMUSCULAR | Status: AC
  Filled 2021-03-28: qty 2

## 2021-03-28 MED ORDER — STERILE WATER FOR IRRIGATION IR SOLN
Status: DC | PRN
  Administered 2021-03-28: 1000 mL

## 2021-03-28 MED ORDER — OXYCODONE HCL 5 MG PO TABS: 5.0000 mg | ORAL_TABLET | Freq: Once | ORAL | Status: AC

## 2021-03-28 MED ORDER — BUPIVACAINE-EPINEPHRINE (PF) 0.25% -1:200000 IJ SOLN
INTRAMUSCULAR | Status: DC | PRN
  Administered 2021-03-28: 50 mL

## 2021-03-28 MED ORDER — BUPIVACAINE-EPINEPHRINE (PF) 0.25% -1:200000 IJ SOLN
INTRAMUSCULAR | Status: AC
  Filled 2021-03-28: qty 30

## 2021-03-28 MED ORDER — DEXAMETHASONE SODIUM PHOSPHATE 10 MG/ML IJ SOLN
INTRAMUSCULAR | Status: DC | PRN
  Administered 2021-03-28: 10 mg via INTRAVENOUS

## 2021-03-28 MED ORDER — BUPIVACAINE LIPOSOME 1.3 % IJ SUSP
INTRAMUSCULAR | Status: AC
  Filled 2021-03-28: qty 20

## 2021-03-28 MED ORDER — LIDOCAINE HCL (CARDIAC) PF 100 MG/5ML IV SOSY
PREFILLED_SYRINGE | INTRAVENOUS | Status: DC | PRN
  Administered 2021-03-28: 100 mg via INTRAVENOUS

## 2021-03-28 MED ORDER — PROPOFOL 10 MG/ML IV BOLUS
INTRAVENOUS | Status: AC
  Filled 2021-03-28: qty 20

## 2021-03-28 SURGICAL SUPPLY — 43 items
ADH SKN CLS APL DERMABOND .7 (GAUZE/BANDAGES/DRESSINGS) ×2
APL PRP STRL LF DISP 70% ISPRP (MISCELLANEOUS) ×1
APPLIER CLIP 9.375 SM OPEN (CLIP) ×4
APR CLP SM 9.3 20 MLT OPN (CLIP) ×2
BLADE SURG 15 STRL LF DISP TIS (BLADE) ×1 IMPLANT
BLADE SURG 15 STRL SS (BLADE) ×2
CHLORAPREP W/TINT 26 (MISCELLANEOUS) ×2 IMPLANT
CLIP APPLIE 9.375 SM OPEN (CLIP) IMPLANT
DERMABOND ADVANCED (GAUZE/BANDAGES/DRESSINGS) ×2
DERMABOND ADVANCED .7 DNX12 (GAUZE/BANDAGES/DRESSINGS) ×1 IMPLANT
DEVICE DUBIN SPECIMEN MAMMOGRA (MISCELLANEOUS) ×2 IMPLANT
DRAPE INCISE IOBAN 66X60 STRL (DRAPES) ×1 IMPLANT
DRAPE LAPAROTOMY TRNSV 106X77 (MISCELLANEOUS) ×2 IMPLANT
ELECT CAUTERY BLADE 6.4 (BLADE) ×2 IMPLANT
ELECT REM PT RETURN 9FT ADLT (ELECTROSURGICAL) ×2
ELECTRODE REM PT RTRN 9FT ADLT (ELECTROSURGICAL) ×1 IMPLANT
GLOVE SURG ENC MOIS LTX SZ7 (GLOVE) ×2 IMPLANT
GOWN STRL REUS W/ TWL LRG LVL3 (GOWN DISPOSABLE) ×2 IMPLANT
GOWN STRL REUS W/TWL LRG LVL3 (GOWN DISPOSABLE) ×4
KIT MARKER MARGIN INK (KITS) ×1 IMPLANT
KIT TURNOVER KIT A (KITS) ×2 IMPLANT
LABEL OR SOLS (LABEL) ×2 IMPLANT
MANIFOLD NEPTUNE II (INSTRUMENTS) ×2 IMPLANT
NDL FILTER BLUNT 18X1 1/2 (NEEDLE) ×1 IMPLANT
NEEDLE FILTER BLUNT 18X 1/2SAF (NEEDLE) ×1
NEEDLE FILTER BLUNT 18X1 1/2 (NEEDLE) ×1 IMPLANT
NEEDLE HYPO 22GX1.5 SAFETY (NEEDLE) ×2 IMPLANT
PACK BASIN MINOR ARMC (MISCELLANEOUS) ×2 IMPLANT
SET LOCALIZER 20 PROBE US (MISCELLANEOUS) ×1 IMPLANT
SLEVE PROBE SENORX GAMMA FIND (MISCELLANEOUS) ×2 IMPLANT
SPONGE T-LAP 18X18 ~~LOC~~+RFID (SPONGE) ×3 IMPLANT
SUT MNCRL 4-0 (SUTURE) ×4
SUT MNCRL 4-0 27XMFL (SUTURE) ×2
SUT VIC AB 2-0 SH 27 (SUTURE) ×4
SUT VIC AB 2-0 SH 27XBRD (SUTURE) ×1 IMPLANT
SUT VIC AB 3-0 SH 27 (SUTURE) ×4
SUT VIC AB 3-0 SH 27X BRD (SUTURE) ×2 IMPLANT
SUTURE MNCRL 4-0 27XMF (SUTURE) ×2 IMPLANT
SYR 10ML LL (SYRINGE) ×2 IMPLANT
SYR 20ML LL LF (SYRINGE) ×2 IMPLANT
TRAP NEPTUNE SPECIMEN COLLECT (MISCELLANEOUS) ×2 IMPLANT
WATER STERILE IRR 1000ML POUR (IV SOLUTION) ×2 IMPLANT
WATER STERILE IRR 500ML POUR (IV SOLUTION) ×2 IMPLANT

## 2021-03-28 NOTE — Op Note (Signed)
Pre-operative Diagnosis: Left Invasive Breast Cancer  involving the upper inner and outer quadrants  Post-operative Diagnosis: Same   Surgeon: Caroleen Hamman,  MD FACS  Anesthesia: General LMA  Procedure:  1.Left Partial mastectomy RF tag guided 2. sentinel node biopsy left axilla 3. Advancement flaps left chest wall breast cavity measures 9x9 cm= 81 cms   Findings: Clip and lesion within xray specimen ( right on the center of resected specimen) Path inferior margin > 88mm Two hot and blue nodes  Estimated Blood Loss: 10cc         Drains: None         Specimens: partial mastectomy with labels, Sentinel node        Complications: none         Condition: Stable   Procedure Details  The patient was seen again in the Holding Room. The benefits, complications, treatment options, and expected outcomes were discussed with the patient. The risks of bleeding, infection, recurrence of symptoms, failure to resolve symptoms, hematoma, seroma, open wound, cosmetic deformity, and the need for further surgery were discussed.  The patient was taken to Operating Room, identified as Tracey Huff and the procedure verified.  A Time Out was held and the above information confirmed.  Prior to the induction of general anesthesia, antibiotic prophylaxis was administered. VTE prophylaxis was in place. Appropriate anesthesia was then administered and tolerated well. The chest was prepped with Chloraprep and draped in the sterile fashion. The patient was positioned in the supine position.  Isosulfan blue dye was injected periareolar early under aseptic conditions.   Using the hand-held probe an area of high counts was identified in the axilla, an incision was made and direction by the probe aided in dissection of a lymph node . I clipped the small lymphatic channels in the standard fashion. A total of two nodes that were both hot and blue were excised. No additional sentinel nodes were  found.  Attention was turned to left breast using the RF localizer the site was identifies and the incision was made in a crescent fashion encompassing the site that was identified.. Dissection around the needle to perform a partial mastectomy with adequate margins was performed. This was done with electrocautery and sharp dissection. Hemostasis was with electrocautery.   Tissue advancement flaps were performed to decrease the volume deficit in the area of the resection.  Please note that the total void area was 9 x 9 cm equals 81 cm.  The chest wall flaps were created by incising the breast parenchyma from the pectoralis fascia in a circumferential method.  The breast parenchyma was then reapproximated in a deep to superficial fashion using interrupted 2-0 Vicryl sutures.  Please note that I placed 2 deep layers of 2-0 Vicryl's.  Once assuring that hemostasis was adequate and checked multiple times the wound was closed with interrupted 3-0 Vicryl followed by 4-0 subcuticular Monocryl sutures.  The axillary wound was closed in a similar fashion.  dermabond was used to coat the skin.  Patient was taken to the recovery room in stable condition  Specimen xray showed excellent margins and clip and lesion within specimen   Caroleen Hamman, MD, FACS

## 2021-03-28 NOTE — Discharge Instructions (Addendum)
AMBULATORY SURGERY  DISCHARGE INSTRUCTIONS   The drugs that you were given will stay in your system until tomorrow so for the next 24 hours you should not:  Drive an automobile Make any legal decisions Drink any alcoholic beverage   You may resume regular meals tomorrow.  Today it is better to start with liquids and gradually work up to solid foods.  You may eat anything you prefer, but it is better to start with liquids, then soup and crackers, and gradually work up to solid foods.   Please notify your doctor immediately if you have any unusual bleeding, trouble breathing, redness and pain at the surgery site, drainage, fever, or pain not relieved by medication.    Your post-operative visit with Dr.                                       is: Date:                        Time:    Please call to schedule your post-operative visit.  Additional Instructions:   Lumpectomy, Care After This sheet gives you information about how to care for yourself after your procedure. Your health care provider may also give you more specific instructions. If you have problems or questions, contact your health care provider. What can I expect after the procedure? After the procedure, it is common to have: Breast swelling. Breast tenderness. Stiffness in your arm or shoulder. A change in the shape and feel of your breast. Scar tissue that feels hard to the touch in the area where the lump was removed. Follow these instructions at home: Medicines Take over-the-counter and prescription medicines only as told by your health care provider. If you were prescribed an antibiotic medicine, take it as told by your health care provider. Do not stop taking the antibiotic even if you start to feel better. Ask your health care provider if the medicine prescribed to you: Requires you to avoid driving or using heavy machinery. Can cause constipation. You may need to take these actions to prevent or treat  constipation: Drink enough fluid to keep your urine pale yellow. Take over-the-counter or prescription medicines. Eat foods that are high in fiber, such as beans, whole grains, and fresh fruits and vegetables. Limit foods that are high in fat and processed sugars, such as fried or sweet foods. Incision care    Follow instructions from your health care provider about how to take care of your incision. Make sure you: Wash your hands with soap and water before and after you change your bandage (dressing). If soap and water are not available, use hand sanitizer. Change your dressing as told by your health care provider. Leave stitches (sutures), skin glue, or adhesive strips in place. These skin closures may need to stay in place for 2 weeks or longer. If adhesive strip edges start to loosen and curl up, you may trim the loose edges. Do not remove adhesive strips completely unless your health care provider tells you to do that. Check your incision area every day for signs of infection. Check for: More redness, swelling, or pain. Fluid or blood. Warmth. Pus or a bad smell. Keep your dressing clean and dry. If you were sent home with a surgical drain in place, follow instructions from your health care provider about emptying it. Bathing Do not take  baths, swim, or use a hot tub until your health care provider approves. Ask your health care provider if you may take showers. You may only be allowed to take sponge baths. Activity Rest as told by your health care provider. Avoid sitting for a long time without moving. Get up to take short walks every 1-2 hours. This is important to improve blood flow and breathing. Ask for help if you feel weak or unsteady. Return to your normal activities as told by your health care provider. Ask your health care provider what activities are safe for you. Be careful to avoid any activities that could cause an injury to your arm on the side of your surgery. Do not  lift anything that is heavier than 10 lb (4.5 kg), or the limit that you are told, until your health care provider says that it is safe. Avoid lifting with the arm that is on the side of your surgery. Do not carry heavy objects on your shoulder on the side of your surgery. Do exercises to keep your shoulder and arm from getting stiff and swollen. Talk with your health care provider about which exercises are safe for you. General instructions Wear a supportive bra as told by your health care provider. Raise (elevate) your arm above the level of your heart while you are sitting or lying down. Do not wear tight jewelry on your arm, wrist, or fingers on the side of your surgery. Keep all follow-up visits as told by your health care provider. This is important. You may need to be screened for extra fluid around the lymph nodes and swelling in the breast and arm (lymphedema). Follow instructions from your health care provider about how often you should be checked. If you had any lymph nodes removed during your procedure, be sure to tell all of your health care providers. This is important information to share before you are involved in certain procedures, such as having blood tests or having your blood pressure taken. Contact a health care provider if: You develop a rash. You have a fever. Your pain medicine is not working. You have swelling, weakness, or numbness in your arm that does not improve after a few weeks. You have new swelling in your breast. You have any of these signs of infection: More redness, swelling, or pain in your incision area. Fluid or blood coming from your incision. Warmth coming from the incision area. Pus or a bad smell coming from your incision. Get help right away if you have: Very bad pain in your breast or arm. Swelling in your legs or arms. Redness, warmth, or pain in your leg or arm. Chest pain. Difficulty breathing. Summary After the procedure, it is common to  have breast tenderness, swelling in your breast, and stiffness in your arm and shoulder. Follow instructions from your health care provider about how to take care of your incision. Do not lift anything that is heavier than 10 lb (4.5 kg), or the limit that you are told, until your health care provider says that it is safe. Avoid lifting with the arm that is on the side of your surgery. If you had any lymph nodes removed during your procedure, be sure to tell all of your health care providers. This is important information to share before you are involved in certain procedures, such as having blood tests or having your blood pressure taken. This information is not intended to replace advice given to you by your health care provider. Make  sure you discuss any questions you have with your health care provider. Document Revised: 10/26/2018 Document Reviewed: 10/26/2018 Elsevier Patient Education  [place].

## 2021-03-28 NOTE — Anesthesia Procedure Notes (Signed)
Procedure Name: LMA Insertion Date/Time: 03/28/2021 2:12 PM Performed by: Timoteo Expose, CRNA Pre-anesthesia Checklist: Emergency Drugs available, Suction available, Timeout performed, Patient being monitored and Patient identified Patient Re-evaluated:Patient Re-evaluated prior to induction Oxygen Delivery Method: Circle system utilized Preoxygenation: Pre-oxygenation with 100% oxygen Induction Type: IV induction LMA: LMA inserted LMA Size: 3.5 Number of attempts: 1

## 2021-03-28 NOTE — Anesthesia Postprocedure Evaluation (Signed)
Anesthesia Post Note  Patient: Tracey Huff  Procedure(s) Performed: BREAST LUMPECTOMY,RADIO FREQ LOCALIZER,AXILLARY SENTINEL LYMPH NODE BIOPSY (Left)  Patient location during evaluation: PACU Anesthesia Type: General Level of consciousness: awake and alert Pain management: pain level controlled Vital Signs Assessment: post-procedure vital signs reviewed and stable Respiratory status: spontaneous breathing, nonlabored ventilation, respiratory function stable and patient connected to nasal cannula oxygen Cardiovascular status: blood pressure returned to baseline and stable Postop Assessment: no apparent nausea or vomiting Anesthetic complications: no   No notable events documented.   Last Vitals:  Vitals:   03/28/21 1545 03/28/21 1600  BP: 127/80 (!) 145/91  Pulse: 78 76  Resp: 18 16  Temp: 36.7 C 36.7 C  SpO2: 97% 98%    Last Pain:  Vitals:   03/28/21 1600  TempSrc:   PainSc: 6                  Arita Miss

## 2021-03-28 NOTE — Transfer of Care (Signed)
Immediate Anesthesia Transfer of Care Note  Patient: Tracey Huff  Procedure(s) Performed: BREAST [place] SENTINEL LYMPH NODE BIOPSY (Left)  Patient Location: PACU  Anesthesia Type:General  Level of Consciousness: drowsy  Airway & Oxygen Therapy: Patient Spontanous Breathing and Patient connected to face mask oxygen  Post-op Assessment: Report given to RN  Post vital signs: stable  Last Vitals:  Vitals Value Taken Time  BP 142/85 03/28/21 1524  Temp    Pulse 91 03/28/21 1526  Resp 24 03/28/21 1526  SpO2 100 % 03/28/21 1526  Vitals shown include unvalidated device data.  Last Pain:  Vitals:   03/28/21 1234  TempSrc: Oral  PainSc: 0-No pain         Complications: No notable events documented.

## 2021-03-28 NOTE — Anesthesia Preprocedure Evaluation (Addendum)
Anesthesia Evaluation  Patient identified by MRN, date of birth, ID band Patient awake    Reviewed: Allergy & Precautions, H&P , NPO status , Patient's Chart, lab work & pertinent test results  Airway Mallampati: III  TM Distance: >3 FB Neck ROM: Full    Dental no notable dental hx.    Pulmonary shortness of breath and with exertion, asthma , pneumonia, resolved,    Pulmonary exam normal        Cardiovascular hypertension, Pt. 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  Fibroid uterus 11/15/2014   Headache    Hyperlipidemia    Hypertension    Menorrhagia with irregular cycle 11/15/2014 Pneumonia    Pre-diabetes    Shortness of breath dyspnea       Reproductive/Obstetrics negative OB ROS Hx fibroid uterus s/p hysterectomy                           Anesthesia Physical Anesthesia Plan  ASA: 2  Anesthesia Plan: General   Post-op Pain Management:    Induction: Intravenous  PONV Risk Score and Plan: 3 and Ondansetron, Dexamethasone and Treatment may vary due to age or medical condition  Airway Management Planned: LMA  Additional Equipment:   Intra-op Plan:   Post-operative Plan: Extubation in OR  Informed Consent: I have reviewed the patients History and Physical, chart, labs and discussed the procedure including the risks, benefits  and alternatives for the proposed anesthesia with the patient or authorized representative who has indicated his/her understanding and acceptance.     Dental advisory given  Plan Discussed with: CRNA, Anesthesiologist and Surgeon  Anesthesia Plan Comments: (Patient consented for risks of anesthesia including but not limited to:  - adverse reactions to medications - damage to eyes, teeth, lips or other oral mucosa - nerve damage due to positioning  - sore throat or hoarseness - damage to heart, brain, nerves, lungs, other parts of body or loss of life  Informed patient about role of CRNA in peri- and intra-operative care.  Patient voiced understanding.)       Anesthesia Quick Evaluation

## 2021-03-28 NOTE — Interval H&P Note (Signed)
History and Physical Interval Note:  03/28/2021 12:35 PM  Tracey Huff  has presented today for surgery, with the diagnosis of breast cancer left.  The various methods of treatment have been discussed with the patient and family. After consideration of risks, benefits and other options for treatment, the patient has consented to  Procedure(s): [place] (Left) as a surgical intervention.  [name] history has been reviewed, patient examined, no change in status, stable for surgery.  I have reviewed the patient's chart and labs.  Questions were answered to the patient's satisfaction.     [place]

## 2021-03-29 ENCOUNTER — Encounter: Payer: Self-pay | Admitting: Surgery

## 2021-04-02 ENCOUNTER — Telehealth: Payer: Self-pay | Admitting: *Deleted

## 2021-04-02 NOTE — Telephone Encounter (Signed)
Faxed FMLA to [employer] at [phone] 

## 2021-04-04 ENCOUNTER — Other Ambulatory Visit: Payer: Self-pay | Admitting: Internal Medicine

## 2021-04-04 ENCOUNTER — Telehealth: Payer: Self-pay | Admitting: Surgery

## 2021-04-04 LAB — SURGICAL PATHOLOGY

## 2021-04-04 NOTE — Telephone Encounter (Signed)
Breast lumpectomy 03/28/21 Dr.Pabon- First day back at work- patient states she is noticed a small amount of swelling just before the bend in the left elbow- she states her arm has been sore since surgery and we discussed that removing the 2 lymph nodes can cause it to be sore- denies any red streaks in the arm and denies pain.Taking 500 mg Tylenol during the day. Using the narcotics at night. She works at [employer] and does not lift. She is keeping  the arm elevated . Denies fever or chills. Try Ibuprofen for the inflammation and see if this helps. If red streaks or pain occurs call the office or go to the emergency room. Keep scheduled post operative appointment 04/11/2021.

## 2021-04-04 NOTE — Telephone Encounter (Signed)
Patient Is calling and has a little concern in her left arm swelling and tender to the touch. Please call patient and advise.

## 2021-04-05 ENCOUNTER — Encounter: Payer: Self-pay | Admitting: Family Medicine

## 2021-04-11 ENCOUNTER — Inpatient Hospital Stay: Payer: 59 | Attending: Internal Medicine | Admitting: Hospice and Palliative Medicine

## 2021-04-11 ENCOUNTER — Ambulatory Visit (INDEPENDENT_AMBULATORY_CARE_PROVIDER_SITE_OTHER): Payer: 59 | Admitting: Surgery

## 2021-04-11 ENCOUNTER — Encounter: Payer: Self-pay | Admitting: Surgery

## 2021-04-11 ENCOUNTER — Other Ambulatory Visit: Payer: Self-pay

## 2021-04-11 VITALS — BP 175/98 | HR 75 | Temp 98.5°F | Ht 60.0 in | Wt 178.4 lb

## 2021-04-11 DIAGNOSIS — Z170 Estrogen receptor positive status [ER+]: Secondary | ICD-10-CM

## 2021-04-11 DIAGNOSIS — C50412 Malignant neoplasm of upper-outer quadrant of left female breast: Secondary | ICD-10-CM

## 2021-04-11 DIAGNOSIS — Z09 Encounter for follow-up examination after completed treatment for conditions other than malignant neoplasm: Secondary | ICD-10-CM

## 2021-04-11 MED ORDER — PREGABALIN 50 MG PO CAPS: 50.0000 mg | ORAL_CAPSULE | Freq: Three times a day (TID) | ORAL | 0 refills | Status: DC

## 2021-04-11 MED ORDER — HYDROCODONE-ACETAMINOPHEN 5-325 MG PO TABS: 1.0000 | ORAL_TABLET | Freq: Four times a day (QID) | ORAL | 0 refills | Status: DC | PRN

## 2021-04-11 NOTE — Progress Notes (Signed)
Outpatient Surgical Follow Up  04/11/2021  Tracey Huff is an 50 y.o. female.   Chief Complaint  Patient presents with   Routine Post Op    post op [place] NODE BIOPSY dr Kaiven Vester 03/28/21    HPI: Tracey Huff is a 50 year old female well-known to me with history of invasive carcinoma of the left breast with an ill-defined lesion that was difficult to localize preoperatively by any imaging modality to include ultrasound and mammogram and MRI.  She underwent a lumpectomy on the left side with sentinel lymph node biopsy.  Surgery was uneventful.  Pathology results personally reviewed and discussed with the patient.  She does have a multifocal disease with multiple positive margins.  Greatest size of invasive carcinoma is 1 cm. 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  Past Medical History:  Diagnosis Date   Allergy    Asthma    Breast cancer ([place])    INVASIVE MAMMARY CARCINOMA- left   Fibroid uterus 11/15/2014   Headache    Hyperlipidemia    Hypertension    Menorrhagia with irregular cycle 11/15/2014   Pneumonia    Pre-diabetes    Shortness of breath dyspnea     Past Surgical History:  Procedure Laterality Date   ABDOMINAL HYSTERECTOMY N/A 11/15/2014   Procedure: HYSTERECTOMY ABDOMINAL/BILATERAL SALPINGECTOMY ;  Surgeon: Will Bonnet, MD;  Location: [facility] ORS;  Service: Gynecology;  Laterality: N/A;   BREAST BIOPSY Right    2019 negative   BREAST BIOPSY Left 03/09/2021   u/s biopsy, 12-1 o'clock, VENUS" clip-path pending   BREAST LUMPECTOMY,RADIO FREQ LOCALIZER,AXILLARY SENTINEL LYMPH NODE BIOPSY Left 03/28/2021   Procedure: BREAST LUMPECTOMY,RADIO FREQ LOCALIZER,AXILLARY SENTINEL LYMPH NODE BIOPSY;  Surgeon: Jules Husbands, MD;  Location: [facility] ORS;  Service: General;  Laterality: Left;   [place]  07/28/2018   CYSTOSCOPY N/A 11/15/2014   Procedure: CYSTOSCOPY;  Surgeon: Will Bonnet, MD;  Location: [facility] ORS;  Service: Gynecology;  Laterality: N/A;   DILATION AND CURETTAGE OF UTERUS     GANGLION CYST EXCISION Left    UTERINE FIBROID SURGERY      Family History  Problem Relation Age of Onset   Congestive Heart Failure Mother    Diabetes Mother    Hypertension Mother    Diabetes Maternal Grandmother    Congestive Heart Failure Maternal Grandfather    Breast cancer Neg Hx     Social History:  reports that she has never smoked. She has never used smokeless tobacco. She reports that she does not currently use alcohol. She reports that she does not use drugs.  Allergies:  Allergies  Allergen Reactions   Macadamia Nut Oil Shortness Of Breath   Apple Nausea And Vomiting   [place] Nut (Berthollefia [place]) Skin Test Nausea And Vomiting and Swelling    Tongue swelling   Carrot Oil     Can not cooked raw carrots   Fruit & Vegetable Daily [Nutritional Supplements] Nausea And Vomiting    Cannot tolerate apples, peaches, plums, and nectarines   Kiwi Extract Nausea And Vomiting   Peach [Prunus Persica] Nausea And Vomiting    Medications reviewed.    ROS Full ROS performed and is otherwise negative other than what is stated in HPI   BP (!) 175/98   Pulse 75   Temp 98.5 F (36.9  C) (Oral)   Ht 5' (1.524 m)   Wt 178 lb 6.4 oz (80.9 kg)   LMP 11/15/2014   SpO2 90%   BMI 34.84 kg/m   Physical Exam Vitals and nursing note reviewed. Exam conducted with a chaperone present.  Constitutional:      General: She is not in acute distress.    Appearance: Normal appearance. She is not ill-appearing.  Cardiovascular:     Rate and Rhythm: Normal rate and regular rhythm.     Heart sounds: No murmur heard. Pulmonary:     Effort: Pulmonary effort is normal. No respiratory distress.     Breath sounds: Normal breath sounds. No stridor. No wheezing.      Comments: BREAST no evidence of palpable masses on the right, surgical changes from lumpectomy on the left, No hematoma or complications. SLNBx scar healing well. No infection. Appropriate incisional tenderness Abdominal:     General: Abdomen is flat. There is no distension.     Palpations: Abdomen is soft. There is no mass.     Tenderness: There is no abdominal tenderness. There is no guarding or rebound.     Hernia: No hernia is present.  Musculoskeletal:     Cervical back: Normal range of motion and neck supple. No tenderness.  Skin:    General: Skin is warm and dry.     Capillary Refill: Capillary refill takes less than 2 seconds.  Neurological:     General: No focal deficit present.     Mental Status: She is alert and oriented to person, place, and time.  Psychiatric:        Mood and Affect: Mood normal.        Behavior: Behavior normal.        Thought Content: Thought content normal.        Judgment: Judgment normal.    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 She will need completion mastectomy given the extension of the disease dose and multifocality.  I have discussed with Dr. Rogue Bussing who will do Oncotype DX to further a evaluate the need for chemotherapy.  Regarding surgical therapy I have discussed with her  about the need for completion mastectomy and Options of immediate reconstruction.  She is interested in immediate reconstruction.  Discussed with her the role of genetic counseling and she wishes to pursue that route.  If she were to have BRCA positive she may wish to do bilateral mastectomies with immediate reconstruction.  We will make appropriate referrals for genetic counseling and plastic surgery.  Please note that the majority of this encounter was related to breast cancer management different options and not  related to her  immediate postoperative lumpectomy needs.  This has required extensive counseling with coordination of care with multiple consultants to include oncology and plastic surgery Please note that I spent over 40 minutes in this encounter including counseling the patient, coordinating her care placing orders and performing appropriate documentation  Caroleen Hamman, MD [place] Surgeon

## 2021-04-11 NOTE — Patient Instructions (Addendum)
Our surgery scheduler Pamala Hurry will call you within 24-48 hours to get you scheduled. If you have not heard from her after 48 hours, please call our office. You will/not need to get Covid tested before surgery and have the blue sheet available when she calls to write down important information.  A referral has been placed to Dr. Claudia Desanctis with plastic surgery. They will call you for an appointment.   If you have any concerns or questions, please feel free to call our office.  Breast Reconstruction With Implant or Tissue Expander Insertion  Breast reconstruction is surgery to rebuild a breast mound after a breast was removed as part of cancer treatment. Many different procedures can be used in breast reconstruction. One method involves creating a new breast mound with an implant or a tissue expander: An implant that is filled with silicone or saline may be placed under the breast flap during your first surgery. A tissue expander is an empty implant that is gradually filled with saline over a period of weeks as your skin and muscles expand. This option requires several follow-up visits. When your breast mound reaches the right size, the expander will be removed surgically and replaced with a regular implant. Breast reconstruction may be done at the same time as the breast removal (mastectomy), or it may be done at a later date.  Breast Self-Awareness Breast self-awareness is knowing how your breasts look and feel. Doing breast self-awareness is important. It allows you to catch a breast problem early while it is still small and can be treated. All women should do breast self-awareness, including women who have had breast implants. Tell your doctor if you notice a change in your breasts. What you need: A mirror. A well-lit room. How to do a breast self-exam A breast self-exam is one way to learn what is normal for your breasts and to check for changes. To do a breast self-exam: Look for changes  Take  off all the clothes above your waist. Stand in front of a mirror in a room with good lighting. Put your hands on your hips. Push your hands down. Look at your breasts and nipples in the mirror to see if one breast or nipple looks different from the other. Check to see if: The shape of one breast is different. The size of one breast is different. There are wrinkles, dips, and bumps in one breast and not the other. Look at each breast for changes in the skin, such as: Redness. Scaly areas. Look for changes in your nipples, such as: Liquid around the nipples. Bleeding. Dimpling. Redness. A change in where the nipples are. Feel for changes  Lie on your back on the floor. Feel each breast. To do this, follow these steps: Pick a breast to feel. Put the arm closest to that breast above your head. Use your other arm to feel the nipple area of your breast. Feel the area with the pads of your three middle fingers by making small circles with your fingers. For the first circle, press lightly. For the second circle, press harder. For the third circle, press even harder. Keep making circles with your fingers at the different pressures as you move down your breast. Stop when you feel your ribs. Move your fingers a little toward the center of your body. Start making circles with your fingers again, this time going up until you reach your collarbone. Keep making up-and-down circles until you reach your armpit. Remember to keep using the  three pressures. Feel the other breast in the same way. Sit or stand in the tub or shower. With soapy water on your skin, feel each breast the same way you did in step 2 when you were lying on the floor. Write down what you find Writing down what you find can help you remember what to tell your doctor. Write down: What is normal for each breast. Any changes you find in each breast, including: The kind of changes you find. Whether you have pain. Size and location  of any lumps. When you last had your menstrual period. General tips Check your breasts every month. If you are breastfeeding, the best time to check your breasts is after you feed your baby or after you use a breast pump. If you get menstrual periods, the best time to check your breasts is 5-7 days after your menstrual period is over. With time, you will become comfortable with the self-exam, and you will begin to know if there are changes in your breasts. Contact a doctor if you: See a change in the shape or size of your breasts or nipples. See a change in the skin of your breast or nipples, such as red or scaly skin. Have fluid coming from your nipples that is not normal. Find a lump or thick area that was not there before. Have pain in your breasts. Have any concerns about your breast health. Summary Breast self-awareness includes looking for changes in your breasts, as well as feeling for changes within your breasts. Breast self-awareness should be done in front of a mirror in a well-lit room. You should check your breasts every month. If you get menstrual periods, the best time to check your breasts is 5-7 days after your menstrual period is over. Let your doctor know of any changes you see in your breasts, including changes in size, changes on the skin, pain or tenderness, or fluid from your nipples that is not normal. This information is not intended to replace advice given to you by your health care provider. Make sure you discuss any questions you have with your health care provider. Document Revised: 12/09/2017 Document Reviewed: 12/09/2017 Elsevier Patient Education  [place].

## 2021-04-11 NOTE — Progress Notes (Signed)
Multidisciplinary Oncology Council Documentation  Chaise TAMULA MORRICAL was presented by our [facility] on 04/11/2021, which included representatives from:  Palliative Care Dietitian  Physical/Occupational Therapist Nurse Navigator Genetics Speech Therapist Social work Survivorship RN IT sales professional   Calandra currently presents with history of breast cancer  We reviewed previous medical and familial history, history of present illness, and recent lab results along with all available histopathologic and imaging studies. The [place] considered available treatment options and made the following recommendations/referrals:  Rehab screening   The MOC is a meeting of clinicians from various specialty areas who evaluate and discuss patients for whom a multidisciplinary approach is being considered. Final determinations in the plan of care are those of the provider(s).   Today's extended care, comprehensive team conference, Asra was not present for the discussion and was not examined.

## 2021-04-12 ENCOUNTER — Telehealth: Payer: Self-pay | Admitting: Licensed Clinical Social Worker

## 2021-04-12 ENCOUNTER — Other Ambulatory Visit: Payer: Self-pay | Admitting: Physician Assistant

## 2021-04-12 ENCOUNTER — Other Ambulatory Visit (HOSPITAL_COMMUNITY): Payer: Self-pay

## 2021-04-12 DIAGNOSIS — J3081 Allergic rhinitis due to animal (cat) (dog) hair and dander: Secondary | ICD-10-CM | POA: Diagnosis not present

## 2021-04-12 DIAGNOSIS — J301 Allergic rhinitis due to pollen: Secondary | ICD-10-CM | POA: Diagnosis not present

## 2021-04-12 DIAGNOSIS — J3089 Other allergic rhinitis: Secondary | ICD-10-CM | POA: Diagnosis not present

## 2021-04-12 MED ORDER — HYDROCODONE-ACETAMINOPHEN 5-325 MG PO TABS
1.0000 | ORAL_TABLET | Freq: Four times a day (QID) | ORAL | 0 refills | Status: DC | PRN
  Filled 2021-04-12: qty 12, 3d supply, fill #0

## 2021-04-12 MED ORDER — PREGABALIN 50 MG PO CAPS
50.0000 mg | ORAL_CAPSULE | Freq: Three times a day (TID) | ORAL | 0 refills | Status: DC
Start: 2021-04-11 — End: 2022-03-20
  Filled 2021-04-12: qty 30, 10d supply, fill #0

## 2021-04-12 NOTE — Telephone Encounter (Signed)
Offered for Tracey Huff to come in and have her blood drawn to get genetic testing process started. Tracey Huff will come in tomorrow, 12/9 at 8:15 am to have her blood drawn and keep her appointment with me 12/13 to discuss further.

## 2021-04-13 ENCOUNTER — Other Ambulatory Visit: Payer: Self-pay

## 2021-04-13 ENCOUNTER — Inpatient Hospital Stay: Payer: 59

## 2021-04-13 DIAGNOSIS — Z803 Family history of malignant neoplasm of breast: Secondary | ICD-10-CM | POA: Diagnosis not present

## 2021-04-16 ENCOUNTER — Telehealth: Payer: Self-pay | Admitting: Surgery

## 2021-04-16 ENCOUNTER — Encounter: Payer: Self-pay | Admitting: *Deleted

## 2021-04-16 NOTE — Telephone Encounter (Signed)
Patient has been advised of Pre-Admission date/time, COVID Testing date and Surgery date.  Surgery Date: 05/15/21 Preadmission Testing Date: 05/03/21 (phone 8a-1p) Covid Testing Date: 05/11/21 @ 8:30 am  - patient advised to go to the [facility] between 8a-12:00p  Patient has been made aware to call [phone], between 1-3:00pm the day before surgery, to find out what time to arrive for surgery.

## 2021-04-16 NOTE — Progress Notes (Signed)
Navigation initiated.  Followed by Dr. Dahlia Byes, and Dr. Rogue Bussing.  Scheduled for left mastectomy with reconstruction 05/15/21.

## 2021-04-17 ENCOUNTER — Encounter: Payer: Self-pay | Admitting: Licensed Clinical Social Worker

## 2021-04-17 ENCOUNTER — Other Ambulatory Visit: Payer: Self-pay

## 2021-04-17 ENCOUNTER — Inpatient Hospital Stay: Payer: 59

## 2021-04-17 ENCOUNTER — Inpatient Hospital Stay: Payer: 59 | Admitting: Licensed Clinical Social Worker

## 2021-04-17 DIAGNOSIS — C50412 Malignant neoplasm of upper-outer quadrant of left female breast: Secondary | ICD-10-CM

## 2021-04-17 DIAGNOSIS — Z8042 Family history of malignant neoplasm of prostate: Secondary | ICD-10-CM | POA: Insufficient documentation

## 2021-04-17 NOTE — Progress Notes (Signed)
REFERRING PROVIDER: Cammie Sickle, MD Tracey Huff,  Tracey Huff  PRIMARY PROVIDER:  Delsa Grana, PA-C  PRIMARY REASON FOR VISIT:  1. Family history of prostate cancer   2. Carcinoma of upper-outer quadrant of left breast in female, estrogen receptor positive ([place])      HISTORY OF PRESENT ILLNESS:   Tracey Huff, a 50 y.o. female, was seen for a [place] cancer genetics consultation at the request of Dr. Rogue Bussing due to a personal and family history of cancer.  Tracey Huff presents to clinic today to discuss the possibility of a hereditary predisposition to cancer, genetic testing, and to further clarify her future cancer risks, as well as potential cancer risks for family members.   In 2022, at the age of 60, Tracey Huff was diagnosed with invasive mammary carcinoma of the left breast, ER/PR+, HER2-. She plans mastectomy on 05/15/2021  CANCER HISTORY:  Oncology History Overview Note  # On physical exam, I palpate a focal firm 2 x 5 cm masslike area over the 11 to 1:30 position of the left breast approximately 2-4 cm from the nipple.   Targeted ultrasound is performed, showing no discrete focal abnormality over the upper mid to outer left breast. There is a pattern of dense fibroglandular tissue over this area which appears slightly different from the dense fibroglandular tissue elsewhere in the left breast. There is hazy decreased echogenicity with subtle shadowing within this dense tissue at the 12 to 1 o'clock position. Some of this vague hazy decreased echogenicity with shadowing is present at the 12 o'clock position 4 cm from the nipple more focal with harmonics and measures approximately 1.4 x 1.6 x 2 cm. There is another more focal area of decreased echogenicity and shadowing at the 1 o'clock position of the left breast 2 cm from the nipple measuring 1.4 x 1.5 x 1.5 cm. These 2 areas appear to be contiguous and are likely part of the same  process.  1.4 x 1.6 x 2 cm. There is another more focal area of decreased echogenicity and shadowing at the 1 o'clock position of the left breast 2 cm from the nipple measuring 1.4 x 1.5 x 1.5 cm. These 2 areas appear to be contiguous and are likely part of the same process.  DIAGNOSIS:  A. 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   Size of invasive carcinoma: 2 mm in this sample  Histologic grade of invasive carcinoma: Grade 2                       Glandular/tubular differentiation score: 3                       Nuclear pleomorphism score: 2                       Mitotic rate score: 1                       Total score: 6  Ductal carcinoma in situ: Present, intermediate grade  Lymphovascular invasion: Not identified    Ultrasound the left axilla is normal. CASE SUMMARY: BREAST BIOMARKER TESTS  Estrogen Receptor (ER) Status: POSITIVE          Percentage of cells with nuclear positivity: 90-100%          Average intensity of staining: Strong   Progesterone Receptor (  PgR) Status: POSITIVE          Percentage of cells with nuclear positivity: 90-100%          Average intensity of staining: Strong   HER2 (by immunohistochemistry): NEGATIVE (Score 1+)  Ki-67: Not performed  TAH; intact ovaries    Carcinoma of upper-outer quadrant of left breast in female, estrogen receptor positive ([place])  03/21/2021 Initial Diagnosis   Carcinoma of upper-outer quadrant of left breast in female, estrogen receptor positive ([place])      RISK FACTORS:  Menarche was at age 93-11.  First live birth at age 48-19.  OCP use for approximately  20+  years.  Ovaries intact: yes.  Hysterectomy: yes. Menopausal status: premenopausal.  HRT use: 0 years. Colonoscopy: no; not examined.  Past Medical History:  Diagnosis Date   Allergy    Asthma    Breast cancer ([place])    INVASIVE MAMMARY CARCINOMA- left   Family history of prostate cancer     Fibroid uterus 11/15/2014   Headache    Hyperlipidemia    Hypertension    Menorrhagia with irregular cycle 11/15/2014   Pneumonia    Pre-diabetes    Shortness of breath dyspnea     Past Surgical History:  Procedure Laterality Date   ABDOMINAL HYSTERECTOMY N/A 11/15/2014   Procedure: HYSTERECTOMY ABDOMINAL/BILATERAL SALPINGECTOMY ;  Surgeon: Will Bonnet, MD;  Location: [facility] ORS;  Service: Gynecology;  Laterality: N/A;   BREAST BIOPSY Right    2019 negative   BREAST BIOPSY Left 03/09/2021   u/s biopsy, 12-1 o'clock, VENUS" clip-path pending   BREAST LUMPECTOMY,RADIO FREQ LOCALIZER,AXILLARY SENTINEL LYMPH NODE BIOPSY Left 03/28/2021   Procedure: BREAST LUMPECTOMY,RADIO FREQ LOCALIZER,AXILLARY SENTINEL LYMPH NODE BIOPSY;  Surgeon: Jules Husbands, MD;  Location: [facility] ORS;  Service: General;  Laterality: Left;   [place]  07/28/2018   CYSTOSCOPY N/A 11/15/2014   Procedure: CYSTOSCOPY;  Surgeon: Will Bonnet, MD;  Location: [facility] ORS;  Service: Gynecology;  Laterality: N/A;   DILATION AND CURETTAGE OF UTERUS     GANGLION CYST EXCISION Left    UTERINE FIBROID SURGERY      Social History   Socioeconomic History   Marital status: Married    Spouse name: Fritz Pickerel   Number of children: 2   Years of education: Not on file   Highest education level: Not on file  Occupational History   Not on file  Tobacco Use   Smoking status: Never   Smokeless tobacco: Never  Vaping Use   Vaping Use: Never used  Substance and Sexual Activity   Alcohol use: Not Currently    Comment: occassional   Drug use: No   Sexual activity: Yes    Partners: Male    Birth control/protection: Surgical  Other Topics Concern   Not on file  Social History Narrative   Works for [employer]; never smoked; no alcohol; 2 children [25 and 31y- at 2022]. Lives with husband.    Social Determinants of Health   Financial Resource Strain: Low Risk    Difficulty of Paying  Living Expenses: Not hard at all  Food Insecurity: No Food Insecurity   Worried About Charity fundraiser in the Last Year: Never true   [place] in the Last Year: Never true  Transportation Needs: No Transportation Needs   Lack of Transportation (Medical): No   Lack of Transportation (Non-Medical): No  Physical Activity: Insufficiently Active   Days of Exercise  per Week: 3 days   Minutes of Exercise per Session: 10 min  Stress: No Stress Concern Present   Feeling of Stress : Only a little  Social Connections: Moderately Integrated   Frequency of Communication with Friends and Family: More than three times a week   Frequency of Social Gatherings with Friends and Family: Once a week   Attends Religious Services: Never   Marine scientist or Organizations: Yes   Attends Music therapist: 1 to 4 times per year   Marital Status: Married     FAMILY HISTORY:  We obtained a detailed, 4-generation family history.  Significant diagnoses are listed below: Family History  Problem Relation Age of Onset   Congestive Heart Failure Mother    Diabetes Mother    Hypertension Mother    Diabetes Maternal Grandmother    Congestive Heart Failure Maternal Grandfather    Breast cancer Neg Hx    Tracey Huff has 2 daughters (33 and 55). She also has 1 brother (95) who has not had cancer.   Tracey Huff mother died at 48. She had 9 maternal uncles, 2 maternal aunts. One uncle had prostate cancer at 51 and is living at 13. A maternal cousin also had prostate cancer. Maternal grandmother died of stroke, grandfather died of heart.  Tracey Huff father died in his 1s-60s, no cancer. Patient had 3 paternal uncles, 2 aunts, no cancers. Paternal grandmother died in her 65s-80s. Grandfather died in his 39s.   Tracey Huff is unaware of previous family history of genetic testing for hereditary cancer risks. Patient's maternal ancestors are of Black/Cherokee descent, and paternal  ancestors are of Black descent. There is no reported Ashkenazi Jewish ancestry. There is no known consanguinity.     GENETIC COUNSELING ASSESSMENT: Tracey Huff is a 50 y.o. female with a personal and family history of cancer which is somewhat suggestive of a hereditary cancer syndrome and predisposition to cancer. We, therefore, discussed and recommended the following at today's visit.   DISCUSSION: We discussed that approximately 10% of breast cancer is hereditary. Most cases of hereditary breast cancer are associated with BRCA1/BRCA2 genes, although there are other genes associated with hereditary breast cancer as well. Cancers and risks are gene specific.  We discussed that testing is beneficial for several reasons including surgical decision-making for breast cancer, knowing about other cancer risks, identifying potential screening and risk-reduction options that may be appropriate, and to understand if other family members could be at risk for cancer and allow them to undergo genetic testing.   We reviewed the characteristics, features and inheritance patterns of hereditary cancer syndromes. We also discussed genetic testing, including the appropriate family members to test, the process of testing, insurance coverage and turn-around-time for results. We discussed the implications of a negative, positive and/or variant of uncertain significant result. In order to get genetic test results in a timely manner so that Tracey Huff can use these genetic test results for surgical decisions, we recommended Tracey Huff pursue genetic testing for the [facility] panel. Once complete, we recommend Tracey Huff pursue reflex genetic testing to the CancerNext-Expanded+RNA gene panel.   Based on Tracey Huff. [place] personal and family history of cancer, she meets medical criteria for genetic testing. Despite that she meets criteria, she may still have an out of pocket cost.   PLAN: After considering the risks,  benefits, and limitations, Tracey Huff provided informed consent to pursue genetic testing and the blood sample was sent to [facility] for  analysis of the BRCAPlus+CancerNext-Expanded+RNA. Results should be available within approximately 1-2 weeks' time, at which point they will be disclosed by telephone to Tracey Huff. [place], as will any additional recommendations warranted by these results. Tracey Huff. [place] will receive a summary of her genetic counseling visit and a copy of her results once available. This information will also be available in Epic.   Tracey Huff. [place] questions were answered to her satisfaction today. Our contact information was provided should additional questions or concerns arise. Thank you for the referral and allowing [place] to share in the care of your patient.   Faith Rogue, Tracey Huff, [facility] Genetic Counselor [email or web address] Phone: [phone]  The patient was seen for a total of 25 minutes in face-to-face genetic counseling.  Patient was seen alone. Dr. Grayland Ormond was available for discussion regarding this case.   _______________________________________________________________________ For Office Staff:  Number of people involved in session: 1 Was an Intern/ student involved with case: no

## 2021-04-19 ENCOUNTER — Other Ambulatory Visit: Payer: Self-pay

## 2021-04-19 ENCOUNTER — Ambulatory Visit (INDEPENDENT_AMBULATORY_CARE_PROVIDER_SITE_OTHER): Payer: 59 | Admitting: Plastic Surgery

## 2021-04-19 ENCOUNTER — Encounter: Payer: Self-pay | Admitting: Plastic Surgery

## 2021-04-19 VITALS — BP 136/80 | HR 80 | Ht 60.0 in | Wt 178.4 lb

## 2021-04-19 DIAGNOSIS — J301 Allergic rhinitis due to pollen: Secondary | ICD-10-CM | POA: Diagnosis not present

## 2021-04-19 DIAGNOSIS — C50412 Malignant neoplasm of upper-outer quadrant of left female breast: Secondary | ICD-10-CM

## 2021-04-19 DIAGNOSIS — Z170 Estrogen receptor positive status [ER+]: Secondary | ICD-10-CM | POA: Diagnosis not present

## 2021-04-19 DIAGNOSIS — J3089 Other allergic rhinitis: Secondary | ICD-10-CM | POA: Diagnosis not present

## 2021-04-19 DIAGNOSIS — J3081 Allergic rhinitis due to animal (cat) (dog) hair and dander: Secondary | ICD-10-CM | POA: Diagnosis not present

## 2021-04-19 NOTE — Progress Notes (Signed)
Referring Provider Delsa Grana, PA-C [address] [place],  [place] [address]   CC:  Chief Complaint  Patient presents with   Consult      Tracey Huff is an 50 y.o. female.  HPI: Patient presents to discuss breast reconstruction.  She has recently been diagnosed with left-sided breast cancer.  She underwent lumpectomy with sentinel lymph node biopsy.  Lumpectomy specimen showed multi focal disease with several positive margins.  Lymph nodes in the left axilla were negative.  She is planning to undergo unilateral mastectomy which may potentially convert to bilateral depending on the results of her genetic testing.  She is interested in immediate reconstruction was sent by Dr. Dahlia Byes.  Allergies  Allergen Reactions   Macadamia Nut Oil Shortness Of Breath   Apple Nausea And Vomiting   [place] Nut (Berthollefia [place]) Skin Test Nausea And Vomiting and Swelling    Tongue swelling   Carrot Oil     Can not cooked raw carrots   Fruit & Vegetable Daily [Nutritional Supplements] Nausea And Vomiting    Cannot tolerate apples, peaches, plums, and nectarines   Kiwi Extract Nausea And Vomiting   Peach [Prunus Persica] Nausea And Vomiting    Outpatient Encounter Medications as of 04/19/2021  Medication Sig   albuterol (PROVENTIL HFA) 108 (90 Base) MCG/ACT inhaler Inhale 1 to 2 puffs every 4-6 hours as needed for cough/wheeze   Ascorbic Acid (VITAMIN C) 100 MG tablet Take 100 mg by mouth daily.   atorvastatin (LIPITOR) 20 MG tablet Take 1 tablet (20 mg total) by mouth at bedtime. (Patient taking differently: Take 10 mg by mouth at bedtime.)   AUVI-Q 0.3 MG/0.3ML SOAJ injection Inject 0.3 mg into the muscle as needed for anaphylaxis.   azelastine (ASTELIN) 0.1 % nasal spray Place 1-2 sprays into both nostrils 2 (two) times daily.   busPIRone (BUSPAR) 7.5 MG tablet Take 1 tablet once daily at night; may take 1 additional tablet daily if needed for anxiety.   Cholecalciferol 25  MCG (1000 UT) capsule Take 1,000 Units by mouth daily.   diphenhydrAMINE (BENADRYL) 2 % cream Apply 1 application topically 2 (two) times daily as needed for itching.   ELDERBERRY PO Take 1 capsule by mouth daily.   fluticasone (FLONASE) 50 MCG/ACT nasal spray Place 1-2 sprays into both nostrils daily. (Patient taking differently: Place 1-2 sprays into both nostrils in the morning and at bedtime.)   Fluticasone-Umeclidin-Vilant (TRELEGY ELLIPTA) 200-62.5-25 MCG/ACT AEPB Inhale 1 puff into the lungs daily.   HYDROcodone-acetaminophen (NORCO) 5-325 MG tablet Take 1 tablet by mouth every 6 (six) hours as needed for moderate pain.   hydrocortisone cream 1 % Apply 1 application topically daily as needed for itching.   hydrOXYzine (ATARAX/VISTARIL) 10 MG tablet Take 1-2 tablets (10-20 mg total) by mouth 3 (three) times daily as needed for itching (rash).   ipratropium-albuterol (DUONEB) 0.5-2.5 (3) MG/3ML SOLN Take 3 mLs by nebulization every 6 (six) hours as needed (wheeze, SOB, cough variant asthma).   levocetirizine (XYZAL) 5 MG tablet Take 1 tablet (5 mg total) by mouth every evening.   losartan (COZAAR) 25 MG tablet TAKE 1 TABLET (25 MG TOTAL) BY MOUTH DAILY.   montelukast (SINGULAIR) 10 MG tablet Take 1 tablet (10 mg total) by mouth every evening.   Olopatadine HCl (PATADAY) 0.2 % SOLN Place 1 drop into affected eye daily. (Patient taking differently: Place 1 drop into both eyes daily as needed (itching).)   pregabalin (LYRICA) 50 MG  capsule Take 1 capsule (50 mg total) by mouth 3 (three) times daily.   [DISCONTINUED] pregabalin (LYRICA) 50 MG capsule Take 1 capsule (50 mg total) by mouth 3 (three) times daily.   No facility-administered encounter medications on file as of 04/19/2021.     Past Medical History:  Diagnosis Date   Allergy    Asthma    Breast cancer ([place])    INVASIVE MAMMARY CARCINOMA- left   Family history of prostate cancer    Fibroid uterus 11/15/2014   Headache     Hyperlipidemia    Hypertension    Menorrhagia with irregular cycle 11/15/2014   Pneumonia    Pre-diabetes    Shortness of breath dyspnea     Past Surgical History:  Procedure Laterality Date   ABDOMINAL HYSTERECTOMY N/A 11/15/2014   Procedure: HYSTERECTOMY ABDOMINAL/BILATERAL SALPINGECTOMY ;  Surgeon: Will Bonnet, MD;  Location: [facility] ORS;  Service: Gynecology;  Laterality: N/A;   BREAST BIOPSY Right    2019 negative   BREAST BIOPSY Left 03/09/2021   u/s biopsy, 12-1 o'clock, VENUS" clip-path pending   BREAST LUMPECTOMY,RADIO FREQ LOCALIZER,AXILLARY SENTINEL LYMPH NODE BIOPSY Left 03/28/2021   Procedure: BREAST LUMPECTOMY,RADIO FREQ LOCALIZER,AXILLARY SENTINEL LYMPH NODE BIOPSY;  Surgeon: Jules Husbands, MD;  Location: [facility] ORS;  Service: General;  Laterality: Left;   [place]  07/28/2018   CYSTOSCOPY N/A 11/15/2014   Procedure: CYSTOSCOPY;  Surgeon: Will Bonnet, MD;  Location: [facility] ORS;  Service: Gynecology;  Laterality: N/A;   DILATION AND CURETTAGE OF UTERUS     GANGLION CYST EXCISION Left    UTERINE FIBROID SURGERY      Family History  Problem Relation Age of Onset   Congestive Heart Failure Mother    Diabetes Mother    Hypertension Mother    Diabetes Maternal Grandmother    Congestive Heart Failure Maternal Grandfather    Breast cancer Neg Hx     Social History   Social History Narrative   Works for [employer]; never smoked; no alcohol; 2 children [25 and 31y- at 2022]. Lives with husband.      Review of Systems General: Denies fevers, chills, weight loss CV: Denies chest pain, shortness of breath, palpitations  Physical Exam Vitals with BMI 04/19/2021 04/11/2021 03/28/2021  Height 5\' 0"  5\' 0"  -  Weight 178 lbs 6 oz 178 lbs 6 oz -  BMI 55.73 22.02 -  Systolic 542 706 237  Diastolic 80 98 82  Pulse 80 75 79    General:  No acute distress,  Alert and oriented, Non-Toxic, Normal speech and affect Breast: She has  minimal ptosis.  She has a oblique lumpectomy scar in the left side just superior to the nipple areolar complex.  She is slightly smaller on that side.  No other obvious scars.  Base width 12 to 13 cm.  Assessment/Plan Patient presents to discuss breast reconstruction.  She is planning on having mastectomy on the left.  We discussed a number of options for reconstruction.  We briefly discussed autologous reconstruction which she did not want to pursue.  Regarding implant reconstruction we discussed immediate insertion of a tissue expander followed by conversion to a gel implant.  We discussed risks include bleeding, infection, damage to surrounding structures need for additional procedures.  We discussed that this was a staged reconstruction and that would require fills between the expander placement and final implant placement.  We discussed wound healing or infectious complications could result in  loss of the implant should they occur.  All of her questions were answered we will plan to be available to help with immediate reconstruction.  Cindra Presume 04/19/2021, 12:41 PM

## 2021-04-24 ENCOUNTER — Telehealth: Payer: Self-pay | Admitting: Licensed Clinical Social Worker

## 2021-04-24 NOTE — Telephone Encounter (Signed)
Disclosed positive genetic testing on the BRCAPlus (STAT) panel. This panel includes 8 genes associated with the highest risks for breast cancer. The remainder of the 46 gene panel is pending and we will call Tracey Huff when it is available.   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 This mutation appears to be controversial, with Cephus Shelling calling it a moderate risk allele conferring an increased risk for breast cancer, but lower than that of typical CHEK2 pathogenic alleles. [facility] calls it a VUS and [facility] calls it Likely Benign. We could consider treating this a moderate/low penetrance CHEK2 variant with increased breast imaging (adding MRI annually) and colonoscopies every 5 years. Even for typical CHEK2 pathogenic variants, NCCN quotes insufficient evidence for RRM. We will discuss further when the remainder of results are back and encouraged Tracey Huff to call us if any questions arise in the meantime.

## 2021-04-25 ENCOUNTER — Encounter: Payer: Self-pay | Admitting: Surgery

## 2021-04-25 ENCOUNTER — Other Ambulatory Visit: Payer: Self-pay

## 2021-04-25 ENCOUNTER — Ambulatory Visit: Payer: 59 | Admitting: Surgery

## 2021-04-25 VITALS — BP 154/88 | HR 98 | Temp 99.3°F | Ht 60.0 in | Wt 176.8 lb

## 2021-04-25 DIAGNOSIS — Z170 Estrogen receptor positive status [ER+]: Secondary | ICD-10-CM

## 2021-04-25 DIAGNOSIS — C50812 Malignant neoplasm of overlapping sites of left female breast: Secondary | ICD-10-CM

## 2021-04-25 NOTE — Patient Instructions (Addendum)
We have spoken today about breast surgery. Your Mastectomy has been scheduled for 05/15/21 at [facility] with Dr. Dahlia Byes.  You will most likely spend one night, and then go home the next day following surgery with 2-3 drains for approximately 5-7 days following your surgery. Please keep an accurate record of your drain amount in ml's or cc's. If your drain suddenly stops draining or has drainage around the tube at the skin, call our office and speak with a nurse immediately.  Information regarding your surgery has been provided below. If you have any questions or concerns, please call our office and speak with a nurse.   Total or Modified Radical Mastectomy A total mastectomy and a modified radical mastectomy are types of surgery for breast cancer. If you are having a total mastectomy (simple mastectomy), your entire breast will be removed. If you are having a modified radical mastectomy, your breast and nipple will be removed along with the lymph nodes under your arm. You may also have some of the lining over the muscle tissues under your breast removed. LET [facility] CARE PROVIDER KNOW ABOUT: Any allergies you have. All medicines you are taking, including vitamins, herbs, eye drops, creams, and over-the-counter medicines. Previous problems you or members of your family have had with the use of anesthetics. Any blood disorders you have. Previous surgeries you have had. Medical conditions you have. RISKS AND COMPLICATIONS Generally, this is a safe procedure. However, problems may occur, including: Pain. Infection. Bleeding. Scar tissue. Chest numbness on the side of the surgery. Fluid buildup under the skin flaps where your breast was removed (seroma). Sensation of throbbing or tingling. Stress or sadness from losing your breast. If you have the lymph nodes under your arm removed, you may have arm swelling, weakness, or numbness on the same side of your body as your surgery. BEFORE THE  PROCEDURE Ask your health care provider about: Changing or stopping your regular medicines. This is especially important if you are taking diabetes medicines or blood thinners. Taking medicines such as aspirin and ibuprofen. These medicines can thin your blood. Do not take these medicines before your procedure if your health care provider instructs you not to. Follow your health care provider's instructions about eating or drinking restrictions. Plan to have someone take you home after the procedure. PROCEDURE An IV tube will be inserted into one of your veins. You will be given a medicine that makes you fall asleep (general anesthetic). Your breast will be cleaned with a germ-killing solution (antiseptic). A wide incision will be made around your nipple. The skin and nipple inside the incision will be removed along with all breast tissue. If you are having a modified radical mastectomy: The lining over your chest muscles will be removed. The incision may be extended to reach the lymph nodes under your arm, or a second incision may be made. The lymph nodes will be removed. You may have a drainage tube inserted into your incision to collect fluid that builds up after surgery. This tube is connected to a suction bulb. Your incision or incisions will be closed with stitches (sutures). A bandage (dressing) will be placed over your breast and under your arm. The procedure may vary among health care providers and hospitals. AFTER THE PROCEDURE You will be moved to a recovery area. Your blood pressure, heart rate, breathing rate, and blood oxygen level will be monitored often until the medicines you were given have worn off. You will be given pain medicine as  needed. After a while, you will be taken to a hospital room. You will be encouraged to get up and walk as soon as you can. Your IV tube can be removed when you are able to eat and drink. Your drain may be removed before you go home from the  hospital, or you may be sent home with your drain and suction bulb.   This information is not intended to replace advice given to you by your health care provider. Make sure you discuss any questions you have with your health care provider.   Document Released: 01/15/2001 Document Revised: 05/13/2014 Document Reviewed: 01/05/2014 Elsevier Interactive Patient Education [employer].

## 2021-04-27 NOTE — H&P (View-Only) (Signed)
Outpatient Surgical Follow Up  04/27/2021  Tracey Huff is an 50 y.o. female.   Chief Complaint  Patient presents with   Pre-op Exam    HPI: Tracey Huff is a 50 year old female well-known to me with history of invasive carcinoma of the left breast with an ill-defined lesion that was difficult to localize preoperatively by any imaging modality to include ultrasound and mammogram and MRI.  She underwent a lumpectomy on the left side with sentinel lymph node biopsy.  Surgery was uneventful.  Pathology results personally reviewed and discussed with the patient.  She does have a multifocal disease with multiple positive margins.  Greatest size of invasive carcinoma is 1 cm. 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 She has thought this through and she would like to proceed with bilateral mastectomy with immediate reconstruction.  Given the location of the tumor I do not think that she will be a good candidate for nipple sparing but we cannot do skin sparing bilateral mastectomy with immediate reconstruction.    Past Medical History:  Diagnosis Date   Allergy    Asthma    Breast cancer ([place])    INVASIVE MAMMARY CARCINOMA- left   Family history of prostate cancer    Fibroid uterus 11/15/2014   Headache    Hyperlipidemia    Hypertension    Menorrhagia with irregular cycle 11/15/2014   Pneumonia    Pre-diabetes    Shortness of breath dyspnea     Past Surgical History:  Procedure Laterality Date   ABDOMINAL HYSTERECTOMY N/A 11/15/2014   Procedure: HYSTERECTOMY ABDOMINAL/BILATERAL SALPINGECTOMY ;  Surgeon: Will Bonnet, MD;  Location: [facility];  Service: Gynecology;  Laterality: N/A;   BREAST BIOPSY Right    2019  negative   BREAST BIOPSY Left 03/09/2021   u/s biopsy, 12-1 o'clock, VENUS" clip-path pending   BREAST LUMPECTOMY,RADIO FREQ LOCALIZER,AXILLARY SENTINEL LYMPH NODE BIOPSY Left 03/28/2021   Procedure: BREAST LUMPECTOMY,RADIO FREQ LOCALIZER,AXILLARY SENTINEL LYMPH NODE BIOPSY;  Surgeon: Jules Husbands, MD;  Location: [facility];  Service: General;  Laterality: Left;   [place]  07/28/2018   CYSTOSCOPY N/A 11/15/2014   Procedure: CYSTOSCOPY;  Surgeon: Will Bonnet, MD;  Location: [facility];  Service: Gynecology;  Laterality: N/A;   DILATION AND CURETTAGE OF UTERUS     GANGLION CYST EXCISION Left    UTERINE FIBROID SURGERY      Family History  Problem Relation Age of Onset   Congestive Heart Failure Mother    Diabetes Mother    Hypertension Mother    Diabetes Maternal Grandmother    Congestive Heart Failure Maternal Grandfather    Breast cancer Neg Hx     Social History:  reports that she has never smoked. She has never used smokeless tobacco. She reports that she does not currently use alcohol. She reports that she does not use drugs.  Allergies:  Allergies  Allergen Reactions   Macadamia Nut Oil Shortness Of Breath   Apple Nausea And Vomiting   [place] Nut (Berthollefia [place]) Skin Test Nausea And Vomiting and Swelling    Tongue swelling   Carrot Oil     Can not cooked raw carrots   Fruit & Vegetable Daily [Nutritional Supplements]  Nausea And Vomiting    Cannot tolerate apples, peaches, plums, and nectarines   Kiwi Extract Nausea And Vomiting   Peach [Prunus Persica] Nausea And Vomiting    Medications reviewed.    ROS Full ROS performed and is otherwise negative other than what is stated in HPI   BP (!) 154/88    Pulse 98    Temp 99.3 F (37.4 C)    Ht 5' (1.524 m)    Wt 176 lb 12.8 oz (80.2 kg)    LMP 11/15/2014    SpO2 98%    BMI 34.53 kg/m   Physical Exam Vitals and nursing note reviewed. Exam conducted with a chaperone present.   Constitutional:      General: She is not in acute distress.    Appearance: Normal appearance. She is normal weight. She is not ill-appearing.  Eyes:     General: No scleral icterus.       Right eye: No discharge.        Left eye: No discharge.  Cardiovascular:     Rate and Rhythm: Normal rate and regular rhythm.     Heart sounds: No murmur heard. Pulmonary:     Effort: Pulmonary effort is normal. No respiratory distress.     Breath sounds: No stridor. No wheezing or rhonchi.     Comments: BREAST: Pectoral incision and axillary node incision healing well without evidence of infection hematomas or complications.  No additional palpable masses Abdominal:     General: Abdomen is flat. There is no distension.     Palpations: Abdomen is soft. There is no mass.     Tenderness: There is no abdominal tenderness. There is no rebound.     Hernia: No hernia is present.  Musculoskeletal:        General: No swelling or tenderness. Normal range of motion.     Cervical back: Normal range of motion and neck supple. No rigidity or tenderness.  Skin:    General: Skin is warm and dry.     Capillary Refill: Capillary refill takes less than 2 seconds.  Neurological:     General: No focal deficit present.     Mental Status: She is alert and oriented to person, place, and time.  Psychiatric:        Mood and Affect: Mood normal.        Behavior: Behavior normal.        Thought Content: Thought content normal.        Judgment: Judgment normal.   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 She will need completion mastectomy given the extension of the disease dose and multifocality.    Regarding surgical therapy I have discussed with her  about the need for completion mastectomy and Options of immediate reconstruction.  She is interested bilateral mastectomy with immediate  reconstruction.  I discussed with her in detail about the operation.  The risks, benefits and possible complications including but not limited to, bleeding, infection seroma formation, chronic pain. We will wait further recommendation from oncology regarding Oncotype DX to determine whether or not she will receive additional chemotherapy.   We have her schedule in a few weeks with Dr. Claudia Desanctis for a combined case of bilateral mastectomies with immediate reconstruction Please note that the majority of this encounter was related to breast cancer management different options and not  related to her immediate postoperative lumpectomy needs.  This has required extensive counseling with coordination of care with multiple consultants to include oncology and plastic surgery Please note that I spent over 40 minutes in this encounter including counseling the patient, coordinating her care placing orders and performing appropriate documentation   Caroleen Hamman, MD [place] [facility]

## 2021-04-27 NOTE — Progress Notes (Signed)
Outpatient Surgical Follow Up  04/27/2021  Tracey Huff is an 50 y.o. female.   Chief Complaint  Patient presents with   Pre-op Exam    HPI: Tracey Huff is a 50 year old female well-known to me with history of invasive carcinoma of the left breast with an ill-defined lesion that was difficult to localize preoperatively by any imaging modality to include ultrasound and mammogram and MRI.  She underwent a lumpectomy on the left side with sentinel lymph node biopsy.  Surgery was uneventful.  Pathology results personally reviewed and discussed with the patient.  She does have a multifocal disease with multiple positive margins.  Greatest size of invasive carcinoma is 1 cm. 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 She has thought this through and she would like to proceed with bilateral mastectomy with immediate reconstruction.  Given the location of the tumor I do not think that she will be a good candidate for nipple sparing but we cannot do skin sparing bilateral mastectomy with immediate reconstruction.    Past Medical History:  Diagnosis Date   Allergy    Asthma    Breast cancer ([place])    INVASIVE MAMMARY CARCINOMA- left   Family history of prostate cancer    Fibroid uterus 11/15/2014   Headache    Hyperlipidemia    Hypertension    Menorrhagia with irregular cycle 11/15/2014   Pneumonia    Pre-diabetes    Shortness of breath dyspnea     Past Surgical History:  Procedure Laterality Date   ABDOMINAL HYSTERECTOMY N/A 11/15/2014   Procedure: HYSTERECTOMY ABDOMINAL/BILATERAL SALPINGECTOMY ;  Surgeon: Will Bonnet, MD;  Location: [facility];  Service: Gynecology;  Laterality: N/A;   BREAST BIOPSY Right    2019  negative   BREAST BIOPSY Left 03/09/2021   u/s biopsy, 12-1 o'clock, VENUS" clip-path pending   BREAST LUMPECTOMY,RADIO FREQ LOCALIZER,AXILLARY SENTINEL LYMPH NODE BIOPSY Left 03/28/2021   Procedure: BREAST LUMPECTOMY,RADIO FREQ LOCALIZER,AXILLARY SENTINEL LYMPH NODE BIOPSY;  Surgeon: Jules Husbands, MD;  Location: [facility];  Service: General;  Laterality: Left;   [name]  07/28/2018   CYSTOSCOPY N/A 11/15/2014   Procedure: CYSTOSCOPY;  Surgeon: Will Bonnet, MD;  Location: [facility];  Service: Gynecology;  Laterality: N/A;   DILATION AND CURETTAGE OF UTERUS     GANGLION CYST EXCISION Left    UTERINE FIBROID SURGERY      Family History  Problem Relation Age of Onset   Congestive Heart Failure Mother    Diabetes Mother    Hypertension Mother    Diabetes Maternal Grandmother    Congestive Heart Failure Maternal Grandfather    Breast cancer Neg Hx     Social History:  reports that she has never smoked. She has never used smokeless tobacco. She reports that she does not currently use alcohol. She reports that she does not use drugs.  Allergies:  Allergies  Allergen Reactions   Macadamia Nut Oil Shortness Of Breath   Apple Nausea And Vomiting   [place] Nut (Berthollefia [place]) Skin Test Nausea And Vomiting and Swelling    Tongue swelling   Carrot Oil     Can not cooked raw carrots   Fruit & Vegetable Daily [Nutritional Supplements]  Nausea And Vomiting    Cannot tolerate apples, peaches, plums, and nectarines   Kiwi Extract Nausea And Vomiting   Peach [Prunus Persica] Nausea And Vomiting    Medications reviewed.    ROS Full ROS performed and is otherwise negative other than what is stated in HPI   BP (!) 154/88    Pulse 98    Temp 99.3 F (37.4 C)    Ht 5' (1.524 m)    Wt 176 lb 12.8 oz (80.2 kg)    LMP 11/15/2014    SpO2 98%    BMI 34.53 kg/m   Physical Exam Vitals and nursing note reviewed. Exam conducted with a chaperone present.   Constitutional:      General: She is not in acute distress.    Appearance: Normal appearance. She is normal weight. She is not ill-appearing.  Eyes:     General: No scleral icterus.       Right eye: No discharge.        Left eye: No discharge.  Cardiovascular:     Rate and Rhythm: Normal rate and regular rhythm.     Heart sounds: No murmur heard. Pulmonary:     Effort: Pulmonary effort is normal. No respiratory distress.     Breath sounds: No stridor. No wheezing or rhonchi.     Comments: BREAST: Pectoral incision and axillary node incision healing well without evidence of infection hematomas or complications.  No additional palpable masses Abdominal:     General: Abdomen is flat. There is no distension.     Palpations: Abdomen is soft. There is no mass.     Tenderness: There is no abdominal tenderness. There is no rebound.     Hernia: No hernia is present.  Musculoskeletal:        General: No swelling or tenderness. Normal range of motion.     Cervical back: Normal range of motion and neck supple. No rigidity or tenderness.  Skin:    General: Skin is warm and dry.     Capillary Refill: Capillary refill takes less than 2 seconds.  Neurological:     General: No focal deficit present.     Mental Status: She is alert and oriented to person, place, and time.  Psychiatric:        Mood and Affect: Mood normal.        Behavior: Behavior normal.        Thought Content: Thought content normal.        Judgment: Judgment normal.   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 She will need completion mastectomy given the extension of the disease dose and multifocality.    Regarding surgical therapy I have discussed with her  about the need for completion mastectomy and Options of immediate reconstruction.  She is interested bilateral mastectomy with immediate  reconstruction.  I discussed with her in detail about the operation.  The risks, benefits and possible complications including but not limited to, bleeding, infection seroma formation, chronic pain. We will wait further recommendation from oncology regarding Oncotype DX to determine whether or not she will receive additional chemotherapy.   We have her schedule in a few weeks with Dr. Claudia Desanctis for a combined case of bilateral mastectomies with immediate reconstruction Please note that the majority of this encounter was related to breast cancer management different options and not  related to her immediate postoperative lumpectomy needs.  This has required extensive counseling with coordination of care with multiple consultants to include oncology and plastic surgery Please note that I spent over 40 minutes in this encounter including counseling the patient, coordinating her care placing orders and performing appropriate documentation   Caroleen Hamman, MD [place] [facility]

## 2021-05-02 ENCOUNTER — Other Ambulatory Visit: Payer: Self-pay

## 2021-05-02 ENCOUNTER — Ambulatory Visit (INDEPENDENT_AMBULATORY_CARE_PROVIDER_SITE_OTHER): Payer: 59 | Admitting: Surgical

## 2021-05-02 ENCOUNTER — Encounter: Payer: Self-pay | Admitting: Surgical

## 2021-05-02 VITALS — BP 146/86 | HR 79 | Ht 60.0 in | Wt 178.0 lb

## 2021-05-02 DIAGNOSIS — Z170 Estrogen receptor positive status [ER+]: Secondary | ICD-10-CM

## 2021-05-02 DIAGNOSIS — C50412 Malignant neoplasm of upper-outer quadrant of left female breast: Secondary | ICD-10-CM

## 2021-05-02 MED ORDER — HYDROCODONE-ACETAMINOPHEN 5-325 MG PO TABS: 1.0000 | ORAL_TABLET | Freq: Four times a day (QID) | ORAL | 0 refills | Status: AC | PRN

## 2021-05-02 MED ORDER — ONDANSETRON HCL 4 MG PO TABS: 4.0000 mg | ORAL_TABLET | Freq: Three times a day (TID) | ORAL | 0 refills | Status: DC | PRN

## 2021-05-02 MED ORDER — SULFAMETHOXAZOLE-TRIMETHOPRIM 800-160 MG PO TABS: 1.0000 | ORAL_TABLET | Freq: Two times a day (BID) | ORAL | 0 refills | Status: DC
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
## 2021-05-03 ENCOUNTER — Encounter
Admission: RE | Admit: 2021-05-03 | Discharge: 2021-05-03 | Disposition: A | Discharge status: Home / Self Care | Payer: 59 | Source: Ambulatory Visit | Attending: Surgery | Admitting: Surgery

## 2021-05-03 NOTE — Patient Instructions (Signed)
Your procedure is scheduled on:05-15-21 Tuesday Report to the Registration Desk on the 1st floor of the [facility].Then proceed to the 2nd floor Surgery Desk in the [facility] To find out your arrival time, please call [phone] between 1PM - 3PM on:05-14-21 Monday  REMEMBER: Instructions that are not followed completely may result in serious medical risk, up to and including death; or upon the discretion of your surgeon and anesthesiologist your surgery may need to be rescheduled.  Do not eat food after midnight the night before surgery.  No gum chewing, lozengers or hard candies.  You may however, drink CLEAR liquids up to 2 hours before you are scheduled to arrive for your surgery. Do not drink anything within 2 hours of your scheduled arrival time.  Clear liquids include: - water  - apple juice without pulp - gatorade (not RED, PURPLE, OR BLUE) - black coffee or tea (Do NOT add milk or creamers to the coffee or tea) Do NOT drink anything that is not on this list.  TAKE THESE MEDICATIONS THE MORNING OF SURGERY WITH A SIP OF WATER: -pregabalin (LYRICA) -You may take busPIRone (BUSPAR) the morning of surgery if needed for anxiety  Use your ipratropium-albuterol (DUONEB) the morning of surgery and bring your albuterol (PROVENTIL HFA) 108 (90 Base) MCG/ACT inhaler to the hospital   One week prior to surgery: Stop Anti-inflammatories (NSAIDS) such as Advil, Aleve, Ibuprofen, Motrin, Naproxen, Naprosyn and Aspirin based products such as Excedrin, Goodys Powder, BC Powder.You may however, continue to take Tylenol/Hydrocodone if needed for pain up until the day of surgery.  Stop ANY OVER THE COUNTER supplements/vitamins 7 days prior to surgery (Ascorbic Acid (VITAMIN C) ELDERBERRY and Vitamin D)  No Alcohol for 24 hours before or after surgery.  No Smoking including e-cigarettes for 24 hours prior to surgery.  No chewable tobacco products for at least 6 hours prior to surgery.  No  nicotine patches on the day of surgery.  Do not use any "recreational" drugs for at least a week prior to your surgery.  Please be advised that the combination of cocaine and anesthesia may have negative outcomes, up to and including death. If you test positive for cocaine, your surgery will be cancelled.  On the morning of surgery brush your teeth with toothpaste and water, you may rinse your mouth with mouthwash if you wish. Do not swallow any toothpaste or mouthwash.  Use CHG Soap as directed on instruction sheet.  Do not wear jewelry, make-up, hairpins, clips or nail polish.  Do not wear lotions, powders, or perfumes.   Do not shave body from the neck down 48 hours prior to surgery just in case you cut yourself which could leave a site for infection.  Also, freshly shaved skin may become irritated if using the CHG soap.  Contact lenses, hearing aids and dentures may not be worn into surgery.  Do not bring valuables to the hospital. [facility] is not responsible for any missing/lost belongings or valuables.   Notify your doctor if there is any change in your medical condition (cold, fever, infection).  Wear comfortable clothing (specific to your surgery type) to the hospital.  After surgery, you can help prevent lung complications by doing breathing exercises.  Take deep breaths and cough every 1-2 hours. Your doctor may order a device called an Incentive Spirometer to help you take deep breaths. When coughing or sneezing, hold a pillow firmly against your incision with both hands. This is called splinting.  Doing this helps protect your incision. It also decreases belly discomfort.  If you are being admitted to the hospital overnight, leave your suitcase in the car. After surgery it may be brought to your room.  If you are being discharged the day of surgery, you will not be allowed to drive home. You will need a responsible adult (18 years or older) to drive you home and stay  with you that night.   If you are taking public transportation, you will need to have a responsible adult (18 years or older) with you. Please confirm with your physician that it is acceptable to use public transportation.   Please call the [facility] at [phone] if you have any questions about these instructions.  Surgery Visitation Policy:  Patients undergoing a surgery or procedure may have one family member or support person with them as long as that person is not COVID-19 positive or experiencing its symptoms.  That person may remain in the waiting area during the procedure and may rotate out with other people.  Inpatient Visitation:    Visiting hours are 7 a.m. to 8 p.m. Up to two visitors ages 16+ are allowed at one time in a patient room. The visitors may rotate out with other people during the day. Visitors must check out when they leave, or other visitors will not be allowed. One designated support person may remain overnight. The visitor must pass COVID-19 screenings, use hand sanitizer when entering and exiting the patients room and wear a mask at all times, including in the patients room. Patients must also wear a mask when staff or their visitor are in the room. Masking is required regardless of vaccination status.

## 2021-05-03 NOTE — Addendum Note (Signed)
Addended by: Caroleen Hamman F on: 05/03/2021 12:45 PM   Modules accepted: Orders

## 2021-05-04 DIAGNOSIS — J3081 Allergic rhinitis due to animal (cat) (dog) hair and dander: Secondary | ICD-10-CM | POA: Diagnosis not present

## 2021-05-04 DIAGNOSIS — J3089 Other allergic rhinitis: Secondary | ICD-10-CM | POA: Diagnosis not present

## 2021-05-04 DIAGNOSIS — J301 Allergic rhinitis due to pollen: Secondary | ICD-10-CM | POA: Diagnosis not present

## 2021-05-08 ENCOUNTER — Telehealth: Payer: Self-pay | Admitting: *Deleted

## 2021-05-08 NOTE — Telephone Encounter (Signed)
Faxed FMLA to [employer] at [phone] 

## 2021-05-09 ENCOUNTER — Ambulatory Visit: Payer: 59 | Admitting: Nurse Practitioner

## 2021-05-09 ENCOUNTER — Other Ambulatory Visit: Payer: Self-pay

## 2021-05-09 ENCOUNTER — Encounter: Payer: Self-pay | Admitting: Nurse Practitioner

## 2021-05-09 ENCOUNTER — Other Ambulatory Visit (HOSPITAL_COMMUNITY): Payer: Self-pay

## 2021-05-09 VITALS — BP 130/76 | HR 100 | Temp 98.8°F | Resp 18 | Ht 60.0 in | Wt 174.9 lb

## 2021-05-09 DIAGNOSIS — C50412 Malignant neoplasm of upper-outer quadrant of left female breast: Secondary | ICD-10-CM | POA: Diagnosis not present

## 2021-05-09 DIAGNOSIS — Z170 Estrogen receptor positive status [ER+]: Secondary | ICD-10-CM

## 2021-05-09 DIAGNOSIS — I10 Essential (primary) hypertension: Secondary | ICD-10-CM

## 2021-05-09 DIAGNOSIS — R7303 Prediabetes: Secondary | ICD-10-CM

## 2021-05-09 DIAGNOSIS — Z79899 Other long term (current) drug therapy: Secondary | ICD-10-CM | POA: Diagnosis not present

## 2021-05-09 DIAGNOSIS — E785 Hyperlipidemia, unspecified: Secondary | ICD-10-CM | POA: Diagnosis not present

## 2021-05-09 MED ORDER — LOSARTAN POTASSIUM 25 MG PO TABS
25.0000 mg | ORAL_TABLET | ORAL | 1 refills | Status: DC
  Filled 2021-05-09: qty 90, 90d supply, fill #0
  Filled 2021-10-29: qty 90, 90d supply, fill #1

## 2021-05-09 NOTE — Progress Notes (Signed)
BP 130/76    Pulse 100    Temp 98.8 F (37.1 C) (Oral)    Resp 18    Ht 5' (1.524 m)    Wt 174 lb 14.4 oz (79.3 kg)    LMP 11/15/2014    SpO2 99%    BMI 34.16 kg/m    Subjective:    Patient ID: Tracey Huff, female    DOB: 05/05/1971, 51 y.o.   MRN: [id number]  HPI: Tracey Huff is a 51 y.o. female, here alone  Chief Complaint  Patient presents with   Follow-up    6 month recheck   Hypertension   HTN: She says she does not check her blood pressure at home.  She denies any chest pain, shortness of breath, headaches or blurred vision. She is currently taking losartan 25 mg daily.    Hyperlipidemia: She says she does take her atorvastatin.  She says she is currently taking 1/2 tablet every other day. She says she is having a hard time tolerating it.  She says it causes her diarrhea.  She denies any myalgia.  Her last LDL was 11/08/20 and it was 180.  She says she has been trying to watch her diet but she is in the middle of life changing event which makes it difficult.  Prediabetes: Last A1C was 6.4 on 11/08/20.  Discussed that she is on the cusp of diabetes.  She says she has tried to watch her diet but she is in the middle of life changing event which makes it difficult.  Will get labs.   Breast cancer: She says she had a biopsy on her left breast and it was positive for cancer.  She says she has markers on her right breast so she is going ahead with the double mastectomy. She is scheduled for a double mastectomy and reconstruction on 05/15/21.  Her surgeon is Dr. Dahlia Byes.  She will be having it at [facility].   Relevant past medical, surgical, family and social history reviewed and updated as indicated. Interim medical history since our last visit reviewed. Allergies and medications reviewed and updated.  Review of Systems  Constitutional: Negative for fever or weight change.  Respiratory: Negative for cough and shortness of breath.   Cardiovascular: Negative for chest pain  or palpitations.  Gastrointestinal: Negative for abdominal pain, no bowel changes.  Musculoskeletal: Negative for gait problem or joint swelling.  Skin: Negative for rash.  Neurological: Negative for dizziness or headache.  No other specific complaints in a complete review of systems (except as listed in HPI above).      Objective:    BP 130/76    Pulse 100    Temp 98.8 F (37.1 C) (Oral)    Resp 18    Ht 5' (1.524 m)    Wt 174 lb 14.4 oz (79.3 kg)    LMP 11/15/2014    SpO2 99%    BMI 34.16 kg/m   Wt Readings from Last 3 Encounters:  05/09/21 174 lb 14.4 oz (79.3 kg)  05/02/21 178 lb (80.7 kg)  04/25/21 176 lb 12.8 oz (80.2 kg)    Physical Exam  Constitutional: Patient appears well-developed and well-nourished. No distress.  HEENT: head atraumatic, normocephalic, pupils equal and reactive to light, neck supple Cardiovascular: Normal rate, regular rhythm and normal heart sounds.  No murmur heard. No BLE edema. Pulmonary/Chest: Effort normal and breath sounds normal. No respiratory distress. Abdominal: Soft.  There is no tenderness. Psychiatric: Patient has a  normal mood and affect. behavior is normal. Judgment and thought content normal.   Results for orders placed or performed during the hospital encounter of 03/28/21  Surgical pathology  Result Value Ref Range   SURGICAL PATHOLOGY      SURGICAL PATHOLOGY CASE: [phone] PATIENT: [facility] Surgical Pathology Report     Specimen Submitted: A. Breast, left B. Sentinel lymph node, left axillary  Clinical History: Breast cancer left      DIAGNOSIS: A. 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 - MULTIPLE MARGINS POSITIVE FOR INVASIVE CARCINOMA AND DCIS. - CLIP AND BIOPSY SITE PRESENT. - SEE CANCER SUMMARY. - SEE COMMENT.  Comment: The entire 6.7 cm lumpectomy specimen is  involved by extensive high grade ductal carcinoma in situ, with multiple small foci of invasive carcinoma. The largest single focus of invasive carcinoma identified measures approximately 10 mm in greatest dimension; however, this measurement does not accurately reflect the overall disease burden.  B. SENTINEL LYMPH NODE, LEFT AXILLARY; EXCISION: - THREE LYMPH NODES, NEGATIVE FOR METAST ATIC CARCINOMA (0/3). - SEE CANCER SUMMARY.  CANCER CASE SUMMARY: INVASIVE CARCINOMA OF THE BREAST Standard(s): AJCC-UICC 8  SPECIMEN Procedure: Lumpectomy Specimen Laterality: Left  TUMOR Histologic Type: Invasive mammary carcinoma, no special type Histologic Grade ([place] Histologic Score)                      Glandular (Acinar)/Tubular Differentiation: 2                      Nuclear Pleomorphism: 2                      Mitotic Rate: 2                      Overall Grade: 2 Tumor Size: 10 mm Ductal Carcinoma In Situ (DCIS): Present, with extensive intraductal component Lymphovascular Invasion: Not identified Treatment Effect in the Breast: No known presurgical treatment  MARGINS Margin Status for Invasive Carcinoma: Margin involved by Invasive Carcinoma                      Medial, posterior  Margin Status for DCIS: Margin(s) Involved by DCIS:                      Medial, superior, anterior, inferior, posterior   REGIONAL LYMPH NODES Regional Lymph  Node Status: All regional lymph nodes negative for tumor                      Total Number of Lymph Nodes Examined (sentinel and non-sentinel): 3                      Number of Sentinel Nodes Examined: 3  DISTANT METASTASIS Distant Site(s) Involved, if applicable: Not applicable  PATHOLOGIC STAGE CLASSIFICATION (pTNM, AJCC 8th Edition): TNM Descriptors: M (multifocal) pT Category: pT1b Regional Lymph Nodes Modifier: sn pN Category: pN0 pM Category: Not applicable  SPECIAL STUDIES Breast Biomarker Testing Performed on Previous  Biopsy: [id number]  Estrogen Receptor (ER) Status: POSITIVE Progesterone Receptor (PgR) Status: POSITIVE HER2 (by immunohistochemistry): NEGATIVE (Score 1+)  (v4.7.0.1)      GROSS DESCRIPTION: Intraoperative Consultation:     Labeled: Left breast lumpectomy     Received: Fresh     Specimen: Breast  lumpectomy     Pathologic evaluation performed: Gross margin evaluation     Diagnosis: IOC left breast: No discrete mass identified.   Biopsy site changes 4 mm from blue inferior margin.     Communicated to: Called to Dr. Dahlia Byes at 2:56 PM on 03/28/2021 Quay Burow M.D.     Tissue submitted: None  A. Labeled: Left breast lumpectomy Received: Fresh Specimen radiograph image(s) available for review Radiographic findings: A clip and RF ID tag are present. Time in fixative: Collected at 2:39 PM on 03/28/2021 and placed in formalin at 2:58 AM on 03/28/2021 Cold ischemic time: Less than 30 minutes Total fixation time: Approximately 50.25 hours Type of procedure: Breast lumpectomy Location / laterality of specimen: Left breast Orientation of specimen: The specimen is received inked. Inking: Anterior = green Inferior = blue Lateral = orange Medial = yellow Posterior = black Superior = red Size of specimen: 6.7 (medial to lateral) x 6.6 (superior to inferior) x 4.7 (anterior to posterior) cm Skin: On the anterior aspect there is a 4.7 x 1 cm ellipse of skin-brown and smooth skin. Biopsy site: There  is a biopsy site which contains a Venus shaped clip.  Number of discrete masses: No distinct masses are grossly appreciated. Description of tissue: The specimen is comprised of yellow lobulated adipose tissue with scattered areas of white firm fibrous tissue. Within the fibrous tissue there are innumerable scattered white to yellow firm nodules.  The fat to fibrous tissue ratio is 80:20.  Block summary (specimen submitted entirely): 1 - 4 - lateral margin, perpendicularly  sectioned 5 - 71 - central sections, submitted entirely and sequentially from lateral to medial      5 - 6 - 1 section, bisected      7 - 8 - 1 section, bisected      9 - 10 - 1 section, bisected      11 - 14 - 1 section, serially sectioned      15 - 18 - 1 section, serially sectioned      19 - 22 - 1 section, serially sectioned      23 - 26 - 1 section, serially sectioned      27 - 31 - 1 section, serially sectioned           28 - clip site      32 - 35 - 1 section, serially sectioned      36 -  40 - 1 section, serially sectioned      41 - 45 - 1 section, serially sectioned      46 - 50 - 1 section, serially sectioned      51 - 54 - 1 section, serially sectioned      55 - 58 - 1 section, serially sectioned      59 - 60 - 1 section, bisected      61 - 64 - 1 section, serially sectioned      65 - 69 - 1 section, serially sectioned      70 - 71 - 1 section, bisected 72 - 75 - medial margin, perpendicularly sectioned  B. Labeled: Sentinel lymph nodes left axillary Received: Fresh Collection time: 2:11 PM on 03/28/2021 Placed into formalin time: 3:12 PM on 03/28/2021 Tissue fragment(s): 2 Size: 2.7 x 1.9 x 1 cm and 4.2 x 2.4 x 1.2 cm Description: Received are fragments of yellow lobulated adipose tissue. There are 3 embedded lymph node candidates ranging from 1 to 2.5 cm  in greatest dimension. The lymph node candidates are entirely submitted in cassettes 1-5 as follows: 1 - 1 lymph node candidate, serially sectioned 2 - 3 - 1 lymph node candidate, serially s ectioned 4 - 5 - 1 lymph node candidate, serially sectioned  RB 03/30/2021  Final Diagnosis performed by Betsy Pries, MD.   Electronically signed 04/04/2021 4:03:23PM The electronic signature indicates that the named Attending Pathologist has evaluated the specimen Technical component performed at [facility], [address] [place], [place] [address] Lab: [phone] Dir: Rush Farmer, MD, MMM  Professional component  performed at [facility], [facility], [place], [place], [place] [address] Lab: [phone] Dir: Kathi Simpers, MD       Assessment & Plan:   1. Essential hypertension  - losartan (COZAAR) 25 MG tablet; Take 1 tablet (25 mg total) by mouth every morning.  Dispense: 90 tablet; Refill: 1 - CBC with Differential/Platelet - COMPLETE METABOLIC PANEL WITH GFR  2. Hyperlipidemia, unspecified hyperlipidemia type  - Lipid panel - COMPLETE METABOLIC PANEL WITH GFR  3. Prediabetes  - Hemoglobin A1c  4. Carcinoma of upper-outer quadrant of left breast in female, estrogen receptor positive ([place]) -continue with current surgery plan  5. Medication management  - losartan (COZAAR) 25 MG tablet; Take 1 tablet (25 mg total) by mouth every morning.  Dispense: 90 tablet; Refill: 1 - Lipid panel - CBC with Differential/Platelet - COMPLETE METABOLIC PANEL WITH GFR   Follow up plan: Return in about 6 months (around 11/06/2021) for follow up.

## 2021-05-10 ENCOUNTER — Ambulatory Visit: Payer: Self-pay | Admitting: Licensed Clinical Social Worker

## 2021-05-10 ENCOUNTER — Telehealth: Payer: Self-pay | Admitting: Licensed Clinical Social Worker

## 2021-05-10 ENCOUNTER — Encounter: Payer: Self-pay | Admitting: Licensed Clinical Social Worker

## 2021-05-10 DIAGNOSIS — Z170 Estrogen receptor positive status [ER+]: Secondary | ICD-10-CM

## 2021-05-10 DIAGNOSIS — Z1379 Encounter for other screening for genetic and chromosomal anomalies: Secondary | ICD-10-CM | POA: Insufficient documentation

## 2021-05-10 DIAGNOSIS — C50412 Malignant neoplasm of upper-outer quadrant of left female breast: Secondary | ICD-10-CM

## 2021-05-10 DIAGNOSIS — Z8042 Family history of malignant neoplasm of prostate: Secondary | ICD-10-CM

## 2021-05-10 LAB — CBC WITH DIFFERENTIAL/PLATELET
Absolute Monocytes: 553 cells/uL (ref 200–950)
Basophils Absolute: 60 cells/uL (ref 0–200)
Basophils Relative: 0.7 %
Eosinophils Absolute: 221 cells/uL (ref 15–500)
Eosinophils Relative: 2.6 %
HCT: 37.6 % (ref 35.0–45.0)
Hemoglobin: 12 g/dL (ref 11.7–15.5)
Lymphs Abs: 2346 cells/uL (ref 850–3900)
MCH: 27.1 pg (ref 27.0–33.0)
MCHC: 31.9 g/dL — ABNORMAL LOW (ref 32.0–36.0)
MCV: 85.1 fL (ref 80.0–100.0)
MPV: 9.8 fL (ref 7.5–12.5)
Monocytes Relative: 6.5 %
Neutro Abs: 5321 cells/uL (ref 1500–7800)
Neutrophils Relative %: 62.6 %
Platelets: 454 10*3/uL — ABNORMAL HIGH (ref 140–400)
RBC: 4.42 10*6/uL (ref 3.80–5.10)
RDW: 13.6 % (ref 11.0–15.0)
Total Lymphocyte: 27.6 %
WBC: 8.5 10*3/uL (ref 3.8–10.8)

## 2021-05-10 LAB — COMPLETE METABOLIC PANEL WITH GFR
AG Ratio: 1.4 (calc) (ref 1.0–2.5)
ALT: 13 U/L (ref 6–29)
AST: 14 U/L (ref 10–35)
Albumin: 4.1 g/dL (ref 3.6–5.1)
Alkaline phosphatase (APISO): 47 U/L (ref 37–153)
BUN: 12 mg/dL (ref 7–25)
CO2: 24 mmol/L (ref 20–32)
Calcium: 9.1 mg/dL (ref 8.6–10.4)
Chloride: 104 mmol/L (ref 98–110)
Creat: 0.82 mg/dL (ref 0.50–1.03)
Globulin: 2.9 g/dL (calc) (ref 1.9–3.7)
Glucose, Bld: 104 mg/dL — ABNORMAL HIGH (ref 65–99)
Potassium: 4.3 mmol/L (ref 3.5–5.3)
Sodium: 138 mmol/L (ref 135–146)
Total Bilirubin: 0.4 mg/dL (ref 0.2–1.2)
Total Protein: 7 g/dL (ref 6.1–8.1)
eGFR: 87 mL/min/{1.73_m2} (ref >=60)

## 2021-05-10 LAB — LIPID PANEL
Cholesterol: 240 mg/dL — ABNORMAL HIGH (ref <200)
HDL: 53 mg/dL (ref >=50)
LDL Cholesterol (Calc): 169 mg/dL (calc) — ABNORMAL HIGH
Non-HDL Cholesterol (Calc): 187 mg/dL (calc) — ABNORMAL HIGH (ref <130)
Total CHOL/HDL Ratio: 4.5 (calc) (ref <5.0)
Triglycerides: 74 mg/dL (ref <150)

## 2021-05-10 LAB — HEMOGLOBIN A1C
Hgb A1c MFr Bld: 6.4 % of total Hgb — ABNORMAL HIGH (ref <5.7)
Mean Plasma Glucose: 137 mg/dL
eAG (mmol/L): 7.6 mmol/L

## 2021-05-10 NOTE — Telephone Encounter (Signed)
Disclosed remainder of results. Disclosed VUS in RAD51D and [place] identified. Remainder of panel was negative/normal. 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

## 2021-05-10 NOTE — Progress Notes (Signed)
Genetic Test Results  HPI:  Tracey Huff was previously seen in the [facility] clinic due to a personal and family history of cancer and concerns regarding a hereditary predisposition to cancer. Please refer to our prior cancer genetics clinic note for more information regarding our discussion, assessment and recommendations, at the time. Tracey Huff recent genetic test results were disclosed to her, as were recommendations warranted by these results. These results and recommendations are discussed in more detail below.  CANCER HISTORY:  Oncology History Overview Note  # On physical exam, I palpate a focal firm 2 x 5 cm masslike area over the 11 to 1:30 position of the left breast approximately 2-4 cm from the nipple.   Targeted ultrasound is performed, showing no discrete focal abnormality over the upper mid to outer left breast. There is a pattern of dense fibroglandular tissue over this area which appears slightly different from the dense fibroglandular tissue elsewhere in the left breast. There is hazy decreased echogenicity with subtle shadowing within this dense tissue at the 12 to 1 o'clock position. Some of this vague hazy decreased echogenicity with shadowing is present at the 12 o'clock position 4 cm from the nipple more focal with harmonics and measures approximately 1.4 x 1.6 x 2 cm. There is another more focal area of decreased echogenicity and shadowing at the 1 o'clock position of the left breast 2 cm from the nipple measuring 1.4 x 1.5 x 1.5 cm. These 2 areas appear to be contiguous and are likely part of the same process.  1.4 x 1.6 x 2 cm. There is another more focal area of decreased echogenicity and shadowing at the 1 o'clock position of the left breast 2 cm from the nipple measuring 1.4 x 1.5 x 1.5 cm. These 2 areas appear to be contiguous and are likely part of the same process.  DIAGNOSIS:  A. LEFT BREAST, 12:00 4CMFN; ULTRASOUND-GUIDED  BIOPSY:  - INVASIVE MAMMARY CARCINOMA, NO SPECIAL TYPE.   Size of invasive carcinoma: 2 mm in this sample  Histologic grade of invasive carcinoma: Grade 2                       Glandular/tubular differentiation score: 3                       Nuclear pleomorphism score: 2                       Mitotic rate score: 1                       Total score: 6  Ductal carcinoma in situ: Present, intermediate grade  Lymphovascular invasion: Not identified    Ultrasound the left axilla is normal. CASE SUMMARY: BREAST BIOMARKER TESTS  Estrogen Receptor (ER) Status: POSITIVE          Percentage of cells with nuclear positivity: 90-100%          Average intensity of staining: Strong   Progesterone Receptor (PgR) Status: POSITIVE          Percentage of cells with nuclear positivity: 90-100%          Average intensity of staining: Strong   HER2 (by immunohistochemistry): NEGATIVE (Score 1+)  Ki-67: Not performed  TAH; intact ovaries    Carcinoma of upper-outer quadrant of left breast in female, estrogen receptor positive ([place])  03/21/2021 Initial Diagnosis  Carcinoma of upper-outer quadrant of left breast in female, estrogen receptor positive ([place])    Genetic Testing   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 VUS in RAD51D called c.434G> and in [facility] called c.476C>T also identified. The final report Huff is 05/09/2020.  The CancerNext-Expanded + RNAinsight gene panel offered by [employer] and includes sequencing and rearrangement analysis for the following 77 genes: IP, ALK, APC*, ATM*, AXIN2, BAP1, BARD1, BLM, BMPR1A, BRCA1*, BRCA2*, BRIP1*, CDC73, CDH1*,CDK4, CDKN1B, CDKN2A, CHEK2*, CTNNA1, DICER1, FANCC, FH, FLCN, GALNT12, KIF1B, LZTR1, MAX, MEN1, MET, MLH1*, MSH2*, MSH3, MSH6*, MUTYH*, NBN, NF1*, NF2, NTHL1, PALB2*, PHOX2B, PMS2*, POT1, PRKAR1A, PTCH1, PTEN*, RAD51C*, RAD51D*,RB1, RECQL, RET, SDHA, SDHAF2,  SDHB, SDHC, SDHD, SMAD4, SMARCA4, SMARCB1, SMARCE1, STK11, SUFU, TMEM127, TP53*,TSC1, TSC2, VHL and XRCC2 (sequencing and deletion/duplication); EGFR, EGLN1, HOXB13, KIT, MITF, PDGFRA, POLD1 and POLE (sequencing only); EPCAM and GREM1 (deletion/duplication only).      FAMILY HISTORY:  We obtained a detailed, 4-generation family history.  Significant diagnoses are listed below: Family History  Problem Relation Age of Onset   Congestive Heart Failure Mother    Diabetes Mother    Hypertension Mother    Diabetes Maternal Grandmother    Congestive Heart Failure Maternal Grandfather    Breast cancer Neg Hx    Tracey Huff has 2 daughters (38 and 86). She also has 1 brother (33) who has not had cancer.    Tracey Huff mother died at 87. She had 9 maternal uncles, 2 maternal aunts. One uncle had prostate cancer at 51 and is living at 70. A maternal cousin also had prostate cancer. Maternal grandmother died of stroke, grandfather died of heart.   Tracey Huff father died in his 85s-60s, no cancer. Patient had 3 paternal uncles, 2 aunts, no cancers. Paternal grandmother died in her 35s-80s. Grandfather died in his 62s.    Tracey Huff is unaware of previous family history of genetic testing for hereditary cancer risks. Patient's maternal ancestors are of Black/Cherokee descent, and paternal ancestors are of Black descent. There is no reported Ashkenazi Jewish ancestry. There is no known consanguinity.     GENETIC TEST RESULTS: Genetic testing reported out on 05/09/2021 through the [employer] CancerNext-Expanded+RNA cancer panel found a moderate risk mutation in the CHEK2 gene called c.1427C>T. The remainder of testing was negative/normal.   The CancerNext-Expanded + RNAinsight gene panel offered by [employer] and includes sequencing and rearrangement analysis for the following 77 genes: IP, ALK, APC*, ATM*, AXIN2, BAP1, BARD1, BLM, BMPR1A, BRCA1*, BRCA2*, BRIP1*, CDC73, CDH1*,CDK4, CDKN1B, CDKN2A,  CHEK2*, CTNNA1, DICER1, FANCC, FH, FLCN, GALNT12, KIF1B, LZTR1, MAX, MEN1, MET, MLH1*, MSH2*, MSH3, MSH6*, MUTYH*, NBN, NF1*, NF2, NTHL1, PALB2*, PHOX2B, PMS2*, POT1, PRKAR1A, PTCH1, PTEN*, RAD51C*, RAD51D*,RB1, RECQL, RET, SDHA, SDHAF2, SDHB, SDHC, SDHD, SMAD4, SMARCA4, SMARCB1, SMARCE1, STK11, SUFU, TMEM127, TP53*,TSC1, TSC2, VHL and XRCC2 (sequencing and deletion/duplication); EGFR, EGLN1, HOXB13, KIT, MITF, PDGFRA, POLD1 and POLE (sequencing only); EPCAM and GREM1 (deletion/duplication only).  The test report has been scanned into EPIC and is located under the Molecular Pathology section of the Results Review tab.  A portion of the result report is included below for reference.      Genetic testing did identify 2 Variants of uncertain significance (VUS) - one in the RAD51D gene called c.434G>A, a second in the [facility] gene called c.476C>.  At this time, it is unknown if these variants are associated with increased cancer risk or if they are normal findings,  but most variants such as these get reclassified to being inconsequential. They should not be used to make medical management decisions. With time, we suspect the lab will determine the significance of these variants, if any. If we do learn more about them, we will try to contact _0 @ _1 @ to discuss it further. However, it is important to stay in touch with [place] periodically and keep the address and phone number up to Huff.   DISCUSSION: CHEK2  The CHEK2 mutation called c.1427C>T found in Tracey Huff appears to be controversial, with [facility] Shelling calling it a moderate risk allele conferring an increased risk for breast cancer, but lower than that of typical CHEK2 pathogenic alleles. Other testing labs call this mutation a VUS or Likely Benign. We could consider treating this a moderate/low penetrance CHEK2 variant.   We discussed the cancers, inheritance, management asscociated with CHEK2, and the importance of telling family members about this  result.   Clinical condition The CHEK2 gene is associated with an increased risk for autosomal dominant adult-onset cancers, including breast, colon, thyroid, prostate, and possibly others (PMID: 06237628, 31517616, 07371062, 69485462, 70350093). The risks of these cancers, particularly breast, have been determined to be both variant- and family history-dependent (PMID: 81829937, 16967893).  Lifetime risks for female breast cancer related to frameshift variants, such as 1100delC, have been estimated to be 25-39% in heterozygotes (PMID: 81017510, 25852778). The risks for most missense variants are unclear, but risks for certain variants (such as p.Ile157Thr) are thought to be lower (likely under 20%) and may not reach the threshold for management change.  Inheritance Hereditary predisposition to cancer due to pathogenic variants in the CHEK2 gene has autosomal dominant inheritance. This means that an individual with a pathogenic variant has a 50% chance of passing the condition on to his/her offspring. Most cases are inherited from a parent, but some cases may occur spontaneously (i.e., an individual with a pathogenic variant has parents who do not have it). Identification of a pathogenic variant allows for the recognition of at-risk relatives who can pursue testing for the familial variant.  Management:    Breast cancer -Moderate risk mutations do not cause the same cancer risks as other mutations in CHEK2; Tracey Huff may have an increased risk of breast cancer but lower than that of typical CHEK2 mutations. Generally, this type of mutation alone is not enough to change management, however breast MRIs in addition to mammograms could be considered.  Tracey Huff plans a bilateral mastectomy on 05/15/2021.  Colon Cancer -Consider colonoscopy every 5 years -If there is a close relative with colorectal cancer, start colonoscopies 10 years earlier than youngest age of cancer in the family  Ms.  Huff does not report family history of colon cancer.   These guidelines are based on current NCCN guidelines (NCCN v.1.2023).  These guidelines are subject to change and continually updated and should be directly referenced for future medical management.     An individuals cancer risk and medical management are not determined by genetic test results alone. Overall cancer risk assessment incorporates additional factors, including personal medical history, family history, and any available genetic information that may result in a personalized plan for cancer prevention and surveillance.  Knowing if a pathogenic CHEK2 variant is present is advantageous. At-risk relatives can be identified, enabling pursuit of a diagnostic evaluation. Information regarding hereditary cancer susceptibility genes is constantly evolving, and more clinically relevant data regarding CHEK2 is likely to become available in the near future. Awareness of this cancer  predisposition encourages patients and their providers to inform at-risk family members, to diligently follow condition-specific screening protocols, and to be vigilant in maintaining close and regular contact with their local genetics clinic in anticipation of new information.   FAMILY MEMBERS: It is important that all of Tracey Huff relatives (both men and women) know of the presence of this gene mutation. Site-specific genetic testing can sort out who in the family is at risk and who is not.   Tracey Huff children and siblings have a 50% chance to have inherited this mutation. We recommend they have genetic testing for this same mutation, as identifying the presence of this mutation would allow them to also take advantage of risk-reducing measures.   PLAN:   1. These results will be made available to her care team, Dr. Rogue Bussing, Dr. Dahlia Byes. She would like Dr. Rogue Bussing to follow her long-term for this indication and coordinate screening/prophylactic  surgeries.    2. Tracey Huff plans to discuss these results with her family and will reach out to [place] if we can be of any assistance in coordinating genetic testing for any of her relatives.    SUPPORT AND RESOURCES: If Tracey Huff is interested in CHEK2-specific information and support, there are two groups, Facing Our Risk (www.facingourrisk.com) and Bright Pink (www.brightpink.org) which some people have found useful. They provide opportunities to speak with other individuals from high-risk families. To locate genetic counselors in other cities, visit the website of the [employer] (ArtistMovie.se) and Secretary/administrator for a Social worker by zip code.  We encouraged Tracey Huff to remain in contact with [place] on an annual basis so we can update her personal and family histories, and let her know of advances in cancer genetics that may benefit the family. Our contact number was provided. Tracey Huff questions were answered to her satisfaction today, and she knows she is welcome to call anytime with additional questions.   Faith Rogue, MS, [facility] Genetic Counselor [email or web address] Phone: [phone]

## 2021-05-11 ENCOUNTER — Ambulatory Visit: Payer: 59 | Admitting: Family Medicine

## 2021-05-11 ENCOUNTER — Other Ambulatory Visit: Payer: Self-pay

## 2021-05-11 ENCOUNTER — Other Ambulatory Visit
Admission: RE | Admit: 2021-05-11 | Discharge: 2021-05-11 | Disposition: A | Discharge status: Home / Self Care | Payer: 59 | Source: Ambulatory Visit | Attending: Surgery | Admitting: Surgery

## 2021-05-11 DIAGNOSIS — Z20822 Contact with and (suspected) exposure to covid-19: Secondary | ICD-10-CM | POA: Diagnosis not present

## 2021-05-11 DIAGNOSIS — Z01812 Encounter for preprocedural laboratory examination: Secondary | ICD-10-CM | POA: Insufficient documentation

## 2021-05-12 LAB — SARS CORONAVIRUS 2 (TAT 6-24 HRS): SARS Coronavirus 2: NEGATIVE

## 2021-05-15 ENCOUNTER — Encounter: Payer: Self-pay | Admitting: Surgery

## 2021-05-15 ENCOUNTER — Observation Stay
Admission: RE | Admit: 2021-05-15 | Discharge: 2021-05-16 | Disposition: A | Discharge status: Home / Self Care | Payer: 59 | Attending: Surgery | Admitting: Surgery

## 2021-05-15 ENCOUNTER — Ambulatory Visit: Payer: 59 | Admitting: Certified Registered"

## 2021-05-15 ENCOUNTER — Other Ambulatory Visit: Payer: Self-pay

## 2021-05-15 ENCOUNTER — Encounter
Admission: RE | Disposition: A | Discharge status: Home / Self Care | Payer: Self-pay | Source: Home / Self Care | Attending: Surgery

## 2021-05-15 DIAGNOSIS — C50812 Malignant neoplasm of overlapping sites of left female breast: Secondary | ICD-10-CM

## 2021-05-15 DIAGNOSIS — C50412 Malignant neoplasm of upper-outer quadrant of left female breast: Principal | ICD-10-CM | POA: Insufficient documentation

## 2021-05-15 DIAGNOSIS — C50919 Malignant neoplasm of unspecified site of unspecified female breast: Secondary | ICD-10-CM | POA: Diagnosis present

## 2021-05-15 DIAGNOSIS — Z170 Estrogen receptor positive status [ER+]: Secondary | ICD-10-CM | POA: Insufficient documentation

## 2021-05-15 DIAGNOSIS — I10 Essential (primary) hypertension: Secondary | ICD-10-CM | POA: Diagnosis not present

## 2021-05-15 DIAGNOSIS — R7303 Prediabetes: Secondary | ICD-10-CM | POA: Insufficient documentation

## 2021-05-15 DIAGNOSIS — Z79899 Other long term (current) drug therapy: Secondary | ICD-10-CM | POA: Diagnosis not present

## 2021-05-15 DIAGNOSIS — C50912 Malignant neoplasm of unspecified site of left female breast: Secondary | ICD-10-CM | POA: Diagnosis not present

## 2021-05-15 HISTORY — PX: BREAST RECONSTRUCTION WITH PLACEMENT OF TISSUE EXPANDER AND FLEX HD (ACELLULAR HYDRATED DERMIS): SHX6295

## 2021-05-15 HISTORY — PX: TOTAL MASTECTOMY: SHX6129

## 2021-05-15 LAB — CREATININE, SERUM
Creatinine, Ser: 0.76 mg/dL (ref 0.44–1.00)
GFR, Estimated: >60 mL/min (ref >60)

## 2021-05-15 LAB — CBC
HCT: 31.1 % — ABNORMAL LOW (ref 36.0–46.0)
Hemoglobin: 10.4 g/dL — ABNORMAL LOW (ref 12.0–15.0)
MCH: 27.7 pg (ref 26.0–34.0)
MCHC: 33.4 g/dL (ref 30.0–36.0)
MCV: 82.7 fL (ref 80.0–100.0)
Platelets: 380 10*3/uL (ref 150–400)
RBC: 3.76 MIL/uL — ABNORMAL LOW (ref 3.87–5.11)
RDW: 13.9 % (ref 11.5–15.5)
WBC: 13.5 10*3/uL — ABNORMAL HIGH (ref 4.0–10.5)
nRBC: 0 % (ref 0.0–0.2)

## 2021-05-15 SURGERY — MASTECTOMY, SIMPLE
Anesthesia: General | Laterality: Bilateral

## 2021-05-15 MED ORDER — ALBUTEROL SULFATE (2.5 MG/3ML) 0.083% IN NEBU: 2.5000 mg | INHALATION_SOLUTION | Freq: Four times a day (QID) | RESPIRATORY_TRACT | Status: DC | PRN

## 2021-05-15 MED ORDER — PROPOFOL 500 MG/50ML IV EMUL
INTRAVENOUS | Status: DC | PRN
  Administered 2021-05-15: 50 ug/kg/min via INTRAVENOUS

## 2021-05-15 MED ORDER — BUSPIRONE HCL 15 MG PO TABS
7.5000 mg | ORAL_TABLET | ORAL | Status: DC | PRN
  Filled 2021-05-15: qty 1

## 2021-05-15 MED ORDER — CHLORHEXIDINE GLUCONATE 0.12 % MT SOLN: 15.0000 mL | Freq: Once | OROMUCOSAL | Status: AC

## 2021-05-15 MED ORDER — FAMOTIDINE 20 MG PO TABS
ORAL_TABLET | ORAL | Status: AC
  Administered 2021-05-15: 20 mg via ORAL
  Filled 2021-05-15: qty 1

## 2021-05-15 MED ORDER — UMECLIDINIUM BROMIDE 62.5 MCG/ACT IN AEPB
1.0000 | INHALATION_SPRAY | Freq: Every day | RESPIRATORY_TRACT | Status: DC
  Administered 2021-05-16: 1 via RESPIRATORY_TRACT
  Filled 2021-05-15: qty 7

## 2021-05-15 MED ORDER — KETOROLAC TROMETHAMINE 30 MG/ML IJ SOLN: 30.0000 mg | Freq: Four times a day (QID) | INTRAMUSCULAR | Status: DC

## 2021-05-15 MED ORDER — PROPOFOL 10 MG/ML IV BOLUS
INTRAVENOUS | Status: AC
  Filled 2021-05-15: qty 20

## 2021-05-15 MED ORDER — FLUTICASONE PROPIONATE 50 MCG/ACT NA SUSP
2.0000 | Freq: Every day | NASAL | Status: DC | PRN
  Filled 2021-05-15: qty 16

## 2021-05-15 MED ORDER — HYDROMORPHONE HCL 1 MG/ML IJ SOLN
INTRAMUSCULAR | Status: DC | PRN
  Administered 2021-05-15: .5 mg via INTRAVENOUS

## 2021-05-15 MED ORDER — MORPHINE SULFATE (PF) 2 MG/ML IV SOLN: 2.0000 mg | INTRAVENOUS | Status: DC | PRN

## 2021-05-15 MED ORDER — ONDANSETRON HCL 4 MG/2ML IJ SOLN
INTRAMUSCULAR | Status: AC
  Filled 2021-05-15: qty 2

## 2021-05-15 MED ORDER — FENTANYL CITRATE (PF) 100 MCG/2ML IJ SOLN
INTRAMUSCULAR | Status: DC | PRN
  Administered 2021-05-15 (×2): 50 ug via INTRAVENOUS

## 2021-05-15 MED ORDER — ONDANSETRON 4 MG PO TBDP: 4.0000 mg | ORAL_TABLET | Freq: Four times a day (QID) | ORAL | Status: DC | PRN

## 2021-05-15 MED ORDER — GENTAMICIN SULFATE 40 MG/ML IJ SOLN
INTRAMUSCULAR | Status: AC
  Filled 2021-05-15: qty 2

## 2021-05-15 MED ORDER — ETOMIDATE 2 MG/ML IV SOLN
INTRAVENOUS | Status: AC
  Filled 2021-05-15: qty 10

## 2021-05-15 MED ORDER — MONTELUKAST SODIUM 10 MG PO TABS
10.0000 mg | ORAL_TABLET | Freq: Every evening | ORAL | Status: DC
  Administered 2021-05-15: 10 mg via ORAL
  Filled 2021-05-15 (×2): qty 1

## 2021-05-15 MED ORDER — LEVOCETIRIZINE DIHYDROCHLORIDE 5 MG PO TABS: 5.0000 mg | ORAL_TABLET | Freq: Every evening | ORAL | Status: DC

## 2021-05-15 MED ORDER — ONDANSETRON HCL 4 MG/2ML IJ SOLN
INTRAMUSCULAR | Status: DC | PRN
  Administered 2021-05-15: 4 mg via INTRAVENOUS

## 2021-05-15 MED ORDER — ISOSULFAN BLUE 1 % ~~LOC~~ SOLN
SUBCUTANEOUS | Status: AC
  Filled 2021-05-15: qty 5

## 2021-05-15 MED ORDER — OXYCODONE HCL 5 MG PO TABS: 5.0000 mg | ORAL_TABLET | Freq: Once | ORAL | Status: DC | PRN

## 2021-05-15 MED ORDER — CEFAZOLIN SODIUM-DEXTROSE 2-4 GM/100ML-% IV SOLN
INTRAVENOUS | Status: AC
  Administered 2021-05-15: 2 g via INTRAVENOUS
  Filled 2021-05-15: qty 100

## 2021-05-15 MED ORDER — FLUTICASONE-UMECLIDIN-VILANT 200-62.5-25 MCG/ACT IN AEPB: 1.0000 | INHALATION_SPRAY | RESPIRATORY_TRACT | Status: DC

## 2021-05-15 MED ORDER — CELECOXIB 200 MG PO CAPS: 200.0000 mg | ORAL_CAPSULE | ORAL | Status: AC

## 2021-05-15 MED ORDER — PREGABALIN 50 MG PO CAPS
50.0000 mg | ORAL_CAPSULE | Freq: Three times a day (TID) | ORAL | Status: DC
  Administered 2021-05-15: 50 mg via ORAL

## 2021-05-15 MED ORDER — BUPIVACAINE-EPINEPHRINE (PF) 0.25% -1:200000 IJ SOLN
INTRAMUSCULAR | Status: AC
  Filled 2021-05-15: qty 30

## 2021-05-15 MED ORDER — HYDROXYZINE HCL 10 MG PO TABS
10.0000 mg | ORAL_TABLET | Freq: Three times a day (TID) | ORAL | Status: DC | PRN
  Filled 2021-05-15: qty 2

## 2021-05-15 MED ORDER — PANTOPRAZOLE SODIUM 40 MG PO TBEC
40.0000 mg | DELAYED_RELEASE_TABLET | Freq: Every day | ORAL | Status: DC
  Administered 2021-05-15: 40 mg via ORAL
  Filled 2021-05-15 (×2): qty 1

## 2021-05-15 MED ORDER — ACETAMINOPHEN 500 MG PO TABS
ORAL_TABLET | ORAL | Status: AC
  Administered 2021-05-15: 1000 mg via ORAL
  Filled 2021-05-15: qty 2

## 2021-05-15 MED ORDER — IPRATROPIUM-ALBUTEROL 0.5-2.5 (3) MG/3ML IN SOLN: 3.0000 mL | Freq: Four times a day (QID) | RESPIRATORY_TRACT | Status: DC | PRN

## 2021-05-15 MED ORDER — STERILE WATER FOR IRRIGATION IR SOLN
Status: DC | PRN
  Administered 2021-05-15: 1000 mL

## 2021-05-15 MED ORDER — INDOCYANINE GREEN 25 MG IV SOLR
INTRAVENOUS | Status: DC | PRN
  Administered 2021-05-15: 5 mg via INTRAVENOUS

## 2021-05-15 MED ORDER — DIPHENHYDRAMINE HCL 50 MG/ML IJ SOLN: 12.5000 mg | Freq: Four times a day (QID) | INTRAMUSCULAR | Status: DC | PRN

## 2021-05-15 MED ORDER — CEFAZOLIN SODIUM 1 G IJ SOLR
INTRAMUSCULAR | Status: AC
  Filled 2021-05-15: qty 10

## 2021-05-15 MED ORDER — CEFAZOLIN SODIUM-DEXTROSE 2-4 GM/100ML-% IV SOLN: 2.0000 g | Freq: Three times a day (TID) | INTRAVENOUS | Status: DC

## 2021-05-15 MED ORDER — ACETAMINOPHEN 500 MG PO TABS: 1000.0000 mg | ORAL_TABLET | ORAL | Status: AC

## 2021-05-15 MED ORDER — PHENYLEPHRINE HCL (PRESSORS) 10 MG/ML IV SOLN
INTRAVENOUS | Status: AC
  Filled 2021-05-15: qty 1

## 2021-05-15 MED ORDER — ROCURONIUM BROMIDE 100 MG/10ML IV SOLN
INTRAVENOUS | Status: DC | PRN
  Administered 2021-05-15: 20 mg via INTRAVENOUS
  Administered 2021-05-15: 50 mg via INTRAVENOUS

## 2021-05-15 MED ORDER — OXYCODONE HCL 5 MG PO TABS
5.0000 mg | ORAL_TABLET | ORAL | Status: DC | PRN
  Administered 2021-05-16: 10 mg via ORAL

## 2021-05-15 MED ORDER — PREGABALIN 50 MG PO CAPS
ORAL_CAPSULE | ORAL | Status: AC
  Filled 2021-05-15: qty 1

## 2021-05-15 MED ORDER — SODIUM CHLORIDE (PF) 0.9 % IJ SOLN
INTRAMUSCULAR | Status: AC
  Filled 2021-05-15: qty 50

## 2021-05-15 MED ORDER — HYDRALAZINE HCL 20 MG/ML IJ SOLN
10.0000 mg | INTRAMUSCULAR | Status: DC | PRN
  Filled 2021-05-15: qty 0.5

## 2021-05-15 MED ORDER — CEFAZOLIN SODIUM-DEXTROSE 2-4 GM/100ML-% IV SOLN
2.0000 g | INTRAVENOUS | Status: AC
  Administered 2021-05-15: 2 g via INTRAVENOUS

## 2021-05-15 MED ORDER — FENTANYL CITRATE (PF) 100 MCG/2ML IJ SOLN
INTRAMUSCULAR | Status: AC
  Filled 2021-05-15: qty 2

## 2021-05-15 MED ORDER — FENTANYL CITRATE (PF) 100 MCG/2ML IJ SOLN
25.0000 ug | INTRAMUSCULAR | Status: DC | PRN
  Administered 2021-05-15: 25 ug via INTRAVENOUS
  Administered 2021-05-15: 50 ug via INTRAVENOUS

## 2021-05-15 MED ORDER — FLUTICASONE FUROATE-VILANTEROL 200-25 MCG/ACT IN AEPB
1.0000 | INHALATION_SPRAY | Freq: Every day | RESPIRATORY_TRACT | Status: DC
  Administered 2021-05-16: 1 via RESPIRATORY_TRACT
  Filled 2021-05-15: qty 28

## 2021-05-15 MED ORDER — MIDAZOLAM HCL 2 MG/2ML IJ SOLN
INTRAMUSCULAR | Status: DC | PRN
  Administered 2021-05-15: 2 mg via INTRAVENOUS

## 2021-05-15 MED ORDER — PROPOFOL 10 MG/ML IV BOLUS
INTRAVENOUS | Status: DC | PRN
  Administered 2021-05-15: 120 mg via INTRAVENOUS

## 2021-05-15 MED ORDER — CHLORHEXIDINE GLUCONATE CLOTH 2 % EX PADS: 6.0000 | MEDICATED_PAD | Freq: Once | CUTANEOUS | Status: DC

## 2021-05-15 MED ORDER — LIDOCAINE HCL (PF) 2 % IJ SOLN
INTRAMUSCULAR | Status: AC
  Filled 2021-05-15: qty 5

## 2021-05-15 MED ORDER — CHLORHEXIDINE GLUCONATE 0.12 % MT SOLN
OROMUCOSAL | Status: AC
  Administered 2021-05-15: 15 mL via OROMUCOSAL
  Filled 2021-05-15: qty 15

## 2021-05-15 MED ORDER — OXYCODONE HCL 5 MG PO TABS
ORAL_TABLET | ORAL | Status: AC
  Administered 2021-05-15: 5 mg via ORAL
  Filled 2021-05-15: qty 1

## 2021-05-15 MED ORDER — OXYCODONE HCL 5 MG/5ML PO SOLN: 5.0000 mg | Freq: Once | ORAL | Status: DC | PRN

## 2021-05-15 MED ORDER — PROCHLORPERAZINE MALEATE 10 MG PO TABS
10.0000 mg | ORAL_TABLET | Freq: Four times a day (QID) | ORAL | Status: DC | PRN
  Filled 2021-05-15 (×2): qty 1

## 2021-05-15 MED ORDER — SUGAMMADEX SODIUM 200 MG/2ML IV SOLN
INTRAVENOUS | Status: DC | PRN
  Administered 2021-05-15: 200 mg via INTRAVENOUS

## 2021-05-15 MED ORDER — KETOROLAC TROMETHAMINE 30 MG/ML IJ SOLN
INTRAMUSCULAR | Status: AC
  Administered 2021-05-15: 30 mg via INTRAVENOUS
  Filled 2021-05-15: qty 1

## 2021-05-15 MED ORDER — GABAPENTIN 300 MG PO CAPS: 300.0000 mg | ORAL_CAPSULE | ORAL | Status: AC

## 2021-05-15 MED ORDER — ENOXAPARIN SODIUM 40 MG/0.4ML IJ SOSY: 40.0000 mg | PREFILLED_SYRINGE | INTRAMUSCULAR | Status: DC

## 2021-05-15 MED ORDER — PHENYLEPHRINE HCL-NACL 20-0.9 MG/250ML-% IV SOLN
INTRAVENOUS | Status: DC | PRN
  Administered 2021-05-15: 40 ug/min via INTRAVENOUS

## 2021-05-15 MED ORDER — LOSARTAN POTASSIUM 25 MG PO TABS
25.0000 mg | ORAL_TABLET | ORAL | Status: DC
  Filled 2021-05-15: qty 1

## 2021-05-15 MED ORDER — CELECOXIB 200 MG PO CAPS
ORAL_CAPSULE | ORAL | Status: AC
  Administered 2021-05-15: 200 mg via ORAL
  Filled 2021-05-15: qty 1

## 2021-05-15 MED ORDER — OXYCODONE HCL 5 MG PO TABS
ORAL_TABLET | ORAL | Status: AC
  Administered 2021-05-15: 10 mg via ORAL
  Filled 2021-05-15: qty 2

## 2021-05-15 MED ORDER — GABAPENTIN 300 MG PO CAPS
ORAL_CAPSULE | ORAL | Status: AC
  Administered 2021-05-15: 300 mg via ORAL
  Filled 2021-05-15: qty 1

## 2021-05-15 MED ORDER — ONDANSETRON HCL 4 MG/2ML IJ SOLN
4.0000 mg | Freq: Four times a day (QID) | INTRAMUSCULAR | Status: DC | PRN
  Administered 2021-05-15: 4 mg via INTRAVENOUS

## 2021-05-15 MED ORDER — SODIUM CHLORIDE 0.9 % IV SOLN
INTRAVENOUS | Status: DC | PRN
  Administered 2021-05-15: 1000 mL

## 2021-05-15 MED ORDER — ACETAMINOPHEN 10 MG/ML IV SOLN: 1000.0000 mg | Freq: Once | INTRAVENOUS | Status: DC | PRN

## 2021-05-15 MED ORDER — ORAL CARE MOUTH RINSE: 15.0000 mL | Freq: Once | OROMUCOSAL | Status: AC

## 2021-05-15 MED ORDER — LIDOCAINE HCL (CARDIAC) PF 100 MG/5ML IV SOSY
PREFILLED_SYRINGE | INTRAVENOUS | Status: DC | PRN
  Administered 2021-05-15: 80 mg via INTRAVENOUS

## 2021-05-15 MED ORDER — CEFAZOLIN SODIUM-DEXTROSE 2-4 GM/100ML-% IV SOLN
INTRAVENOUS | Status: AC
  Filled 2021-05-15: qty 100

## 2021-05-15 MED ORDER — FENTANYL CITRATE (PF) 100 MCG/2ML IJ SOLN
INTRAMUSCULAR | Status: AC
  Administered 2021-05-15: 25 ug via INTRAVENOUS
  Filled 2021-05-15: qty 2

## 2021-05-15 MED ORDER — HYDROMORPHONE HCL 1 MG/ML IJ SOLN
INTRAMUSCULAR | Status: AC
  Filled 2021-05-15: qty 1

## 2021-05-15 MED ORDER — PHENYLEPHRINE HCL-NACL 20-0.9 MG/250ML-% IV SOLN
INTRAVENOUS | Status: AC
  Filled 2021-05-15: qty 250

## 2021-05-15 MED ORDER — SODIUM CHLORIDE 0.9 % IV SOLN: INTRAVENOUS | Status: DC

## 2021-05-15 MED ORDER — FAMOTIDINE 20 MG PO TABS: 20.0000 mg | ORAL_TABLET | Freq: Once | ORAL | Status: AC

## 2021-05-15 MED ORDER — MIDAZOLAM HCL 2 MG/2ML IJ SOLN
INTRAMUSCULAR | Status: AC
  Filled 2021-05-15: qty 2

## 2021-05-15 MED ORDER — SODIUM CHLORIDE FLUSH 0.9 % IV SOLN
INTRAVENOUS | Status: AC
  Filled 2021-05-15: qty 10

## 2021-05-15 MED ORDER — PROCHLORPERAZINE EDISYLATE 10 MG/2ML IJ SOLN
5.0000 mg | Freq: Four times a day (QID) | INTRAMUSCULAR | Status: DC | PRN
  Filled 2021-05-15 (×2): qty 2

## 2021-05-15 MED ORDER — ACETAMINOPHEN 500 MG PO TABS: 1000.0000 mg | ORAL_TABLET | Freq: Four times a day (QID) | ORAL | Status: DC

## 2021-05-15 MED ORDER — PROMETHAZINE HCL 25 MG/ML IJ SOLN: 6.2500 mg | INTRAMUSCULAR | Status: DC | PRN

## 2021-05-15 MED ORDER — BUPIVACAINE LIPOSOME 1.3 % IJ SUSP
INTRAMUSCULAR | Status: AC
  Filled 2021-05-15: qty 20

## 2021-05-15 MED ORDER — PHENYLEPHRINE HCL (PRESSORS) 10 MG/ML IV SOLN
INTRAVENOUS | Status: DC | PRN
  Administered 2021-05-15 (×4): 100 ug via INTRAVENOUS

## 2021-05-15 MED ORDER — LACTATED RINGERS IV SOLN: INTRAVENOUS | Status: DC

## 2021-05-15 MED ORDER — DIPHENHYDRAMINE HCL 12.5 MG/5ML PO ELIX
12.5000 mg | ORAL_SOLUTION | Freq: Four times a day (QID) | ORAL | Status: DC | PRN
  Filled 2021-05-15 (×2): qty 5

## 2021-05-15 SURGICAL SUPPLY — 85 items
APPLIER CLIP 9.375 SM OPEN (CLIP)
BIOPATCH WHT 1IN DISK W/4.0 H (GAUZE/BANDAGES/DRESSINGS) ×2 IMPLANT
BLADE BOVIE TIP EXT 4 (BLADE) ×3 IMPLANT
BLADE SURG 10 STRL SS (BLADE) ×2 IMPLANT
BLADE SURG 15 STRL LF DISP TIS (BLADE) ×4 IMPLANT
BLADE SURG 15 STRL SS (BLADE) ×4
BNDG ELASTIC 6X5.8 VLCR NS LF (GAUZE/BANDAGES/DRESSINGS) ×6 IMPLANT
BULB RESERV EVAC DRAIN JP 100C (MISCELLANEOUS) ×2 IMPLANT
CHLORAPREP W/TINT 26 (MISCELLANEOUS) ×8 IMPLANT
CLIP APPLIE 9.375 SM OPEN (CLIP) ×2 IMPLANT
CNTNR SPEC 2.5X3XGRAD LEK (MISCELLANEOUS)
CONT SPEC 4OZ STER OR WHT (MISCELLANEOUS)
CONTAINER SPEC 2.5X3XGRAD LEK (MISCELLANEOUS) ×2 IMPLANT
DERMABOND ADVANCED (GAUZE/BANDAGES/DRESSINGS) ×4
DERMABOND ADVANCED .7 DNX12 (GAUZE/BANDAGES/DRESSINGS) ×8 IMPLANT
DRAIN CHANNEL JP 15F RND 16 (MISCELLANEOUS) ×2 IMPLANT
DRAPE 3/4 80X56 (DRAPES) ×6 IMPLANT
DRAPE CHEST BREAST 77X106 FENE (MISCELLANEOUS) ×1 IMPLANT
DRAPE LAPAROTOMY TRNSV 106X77 (MISCELLANEOUS) ×3 IMPLANT
DRAPE UTILITY 15X26 TOWEL STRL (DRAPES) ×6 IMPLANT
ELECT CAUTERY BLADE 6.4 (BLADE) ×3 IMPLANT
ELECT CAUTERY BLADE TIP 2.5 (TIP) ×3
ELECT REM PT RETURN 9FT ADLT (ELECTROSURGICAL) ×6
ELECTRODE CAUTERY BLDE TIP 2.5 (TIP) ×2 IMPLANT
ELECTRODE REM PT RTRN 9FT ADLT (ELECTROSURGICAL) ×4 IMPLANT
EXPANDER TISSUE CPX4 450CC (Expander) ×2 IMPLANT
GAUZE 4X4 16PLY ~~LOC~~+RFID DBL (SPONGE) ×3 IMPLANT
GAUZE SPONGE 4X4 12PLY STRL (GAUZE/BANDAGES/DRESSINGS) ×5 IMPLANT
GAUZE SPONGE 4X4 16PLY XRAY LF (GAUZE/BANDAGES/DRESSINGS) ×1 IMPLANT
GLOVE SRG 8 PF TXTR STRL LF DI (GLOVE) ×2 IMPLANT
GLOVE SURG ENC MOIS LTX SZ7 (GLOVE) ×3 IMPLANT
GLOVE SURG ENC TEXT LTX SZ7.5 (GLOVE) ×6 IMPLANT
GLOVE SURG UNDER POLY LF SZ7.5 (GLOVE) ×1 IMPLANT
GLOVE SURG UNDER POLY LF SZ8 (GLOVE) ×1
GOWN STRL REUS W/ TWL LRG LVL3 (GOWN DISPOSABLE) ×14 IMPLANT
GOWN STRL REUS W/TWL LRG LVL3 (GOWN DISPOSABLE) ×7
GRAFT FLEX HD 19X22X0.7-1.4 (Tissue) ×2 IMPLANT
IV NS 1000ML (IV SOLUTION)
IV NS 1000ML BAXH (IV SOLUTION) IMPLANT
IV NS 500ML (IV SOLUTION) ×1
IV NS 500ML BAXH (IV SOLUTION) IMPLANT
KIT FILL ASEPTIC TRANSFER (MISCELLANEOUS) ×4 IMPLANT
KIT MARKER MARGIN INK (KITS) ×3 IMPLANT
KIT TURNOVER KIT A (KITS) ×6 IMPLANT
LABEL OR SOLS (LABEL) ×3 IMPLANT
MANIFOLD NEPTUNE II (INSTRUMENTS) ×6 IMPLANT
NDL 21 GA WING INFUSION (NEEDLE) IMPLANT
NDL HYPO 27GX1-1/4 (NEEDLE) ×2 IMPLANT
NDL SAFETY ECLIPSE 18X1.5 (NEEDLE) ×4 IMPLANT
NEEDLE 21 GA WING INFUSION (NEEDLE) IMPLANT
NEEDLE HYPO 18GX1.5 SHARP (NEEDLE) ×2
NEEDLE HYPO 22GX1.5 SAFETY (NEEDLE) ×6 IMPLANT
NEEDLE HYPO 27GX1-1/4 (NEEDLE) ×3 IMPLANT
NS IRRIG 1000ML POUR BTL (IV SOLUTION) ×3 IMPLANT
PACK BASIN MAJOR ARMC (MISCELLANEOUS) ×3 IMPLANT
PACK BASIN MINOR ARMC (MISCELLANEOUS) ×3 IMPLANT
PACK SPY-PHI (KITS) ×1 IMPLANT
PAD ABD DERMACEA PRESS 5X9 (GAUZE/BANDAGES/DRESSINGS) ×2 IMPLANT
PAD PREP 24X41 OB/GYN DISP (PERSONAL CARE ITEMS) ×4 IMPLANT
PIN SAFETY STRL (MISCELLANEOUS) ×3 IMPLANT
SLEVE PROBE SENORX GAMMA FIND (MISCELLANEOUS) ×2 IMPLANT
SPONGE T-LAP 18X18 ~~LOC~~+RFID (SPONGE) ×11 IMPLANT
STAPLER INSORB 30 2030 C-SECTI (MISCELLANEOUS) ×2 IMPLANT
STRIP CLOSURE SKIN 1/2X4 (GAUZE/BANDAGES/DRESSINGS) ×2 IMPLANT
SUT ETH BLK MONO 3 0 FS 1 12/B (SUTURE) ×2 IMPLANT
SUT ETHILON 3-0 FS-10 30 BLK (SUTURE) ×9
SUT MNCRL 4-0 (SUTURE) ×2
SUT MNCRL 4-0 27XMFL (SUTURE) ×4
SUT PDS II 3-0 (SUTURE) ×8 IMPLANT
SUT PDS PLUS 2 (SUTURE)
SUT PDS PLUS AB 2-0 CT-1 (SUTURE) ×4 IMPLANT
SUT SILK 2 0 SH (SUTURE) ×2 IMPLANT
SUT V-LOC 90 ABS 3-0 VLT  V-20 (SUTURE) ×2
SUT V-LOC 90 ABS 3-0 VLT V-20 (SUTURE) ×2 IMPLANT
SUT VIC AB 2-0 CT1 (SUTURE) ×12 IMPLANT
SUTURE EHLN 3-0 FS-10 30 BLK (SUTURE) ×2 IMPLANT
SUTURE MNCRL 4-0 27XMF (SUTURE) ×4 IMPLANT
SWABSTK COMLB BENZOIN TINCTURE (MISCELLANEOUS) ×2 IMPLANT
SYR 10ML LL (SYRINGE) ×3 IMPLANT
SYR 30ML LL (SYRINGE) ×6 IMPLANT
SYR 3ML LL SCALE MARK (SYRINGE) ×3 IMPLANT
SYR 5ML LL (SYRINGE) IMPLANT
SYR BULB IRRIG 60ML STRL (SYRINGE) ×3 IMPLANT
WATER STERILE IRR 1000ML POUR (IV SOLUTION) ×3 IMPLANT
WATER STERILE IRR 500ML POUR (IV SOLUTION) ×6 IMPLANT

## 2021-05-15 NOTE — Transfer of Care (Signed)
Immediate Anesthesia Transfer of Care Note  Patient: Tracey Huff  Procedure(s) Performed: TOTAL MASTECTOMY, Bilateral Simple Mastectomy, skin sparing (Bilateral) BILATERAL BREAST RECONSTRUCTION WITH PLACEMENT OF TISSUE EXPANDER AND FLEX HD (ACELLULAR HYDRATED DERMIS) (Bilateral)  Patient Location: PACU  Anesthesia Type:General  Level of Consciousness: awake and drowsy  Airway & Oxygen Therapy: Patient Spontanous Breathing and Patient connected to nasal cannula oxygen  Post-op Assessment: Report given to RN and Post -op Vital signs reviewed and stable  Post vital signs: stable  Last Vitals:  Vitals Value Taken Time  BP 137/80 05/15/21 1452  Temp    Pulse 74 05/15/21 1458  Resp 12 05/15/21 1458  SpO2 100 % 05/15/21 1458  Vitals shown include unvalidated device data.  Last Pain:  Vitals:   05/15/21 1110  TempSrc: Temporal  PainSc: 0-No pain      Patients Stated Pain Goal: 0 (03/01/24 3664)  Complications: No notable events documented.

## 2021-05-15 NOTE — Plan of Care (Signed)

## 2021-05-15 NOTE — Interval H&P Note (Signed)
History and Physical Interval Note:  05/15/2021 11:30 AM  Tracey Huff  has presented today for surgery, with the diagnosis of left breast cancer.  The various methods of treatment have been discussed with the patient and family. After consideration of risks, benefits and other options for treatment, the patient has consented to  Procedure(s) with comments: TOTAL MASTECTOMY, Bilateral Simple Mastectomy, skin sparing (Bilateral) - Provider requesting for 2 hours / 120 min for procedure. BILATERAL BREAST RECONSTRUCTION WITH PLACEMENT OF TISSUE EXPANDER AND FLEX HD (ACELLULAR HYDRATED DERMIS) (Bilateral) as a surgical intervention.  [name] history has been reviewed, patient examined, no change in status, stable for surgery.  I have reviewed the patient's chart and labs.  Questions were answered to the patient's satisfaction.     [place]

## 2021-05-15 NOTE — Op Note (Signed)
Operative Note   DATE OF OPERATION: 05/15/2021  SURGICAL DEPARTMENT: Plastic Surgery  PREOPERATIVE DIAGNOSES: Breast cancer  POSTOPERATIVE DIAGNOSES:  same  PROCEDURE: 1.  Bilateral breast reconstruction with tissue expanders and acellular dermal matrix 2.  Indocyanine green angiography bilateral mastectomy flaps  SURGEON: Talmadge Coventry, MD  ASSISTANT: Krista Blue, PA The advanced practice practitioner (APP) assisted throughout the case.  The APP was essential in retraction and counter traction when needed to make the case progress smoothly.  This retraction and assistance made it possible to see the tissue planes for the procedure.  The assistance was needed for hemostasis, tissue re-approximation and closure of the incision site.   ANESTHESIA:  General.   COMPLICATIONS: None.   INDICATIONS FOR PROCEDURE:  The patient, Tracey Huff is a 51 y.o. female born on Apr 15, 1971, is here for treatment of bilateral mastectomy defects MRN: [id number]  CONSENT:  Informed consent was obtained directly from the patient. Risks, benefits and alternatives were fully discussed. Specific risks including but not limited to bleeding, infection, hematoma, seroma, scarring, pain, contracture, asymmetry, wound healing problems, and need for further surgery were all discussed. The patient did have an ample opportunity to have questions answered to satisfaction.   DESCRIPTION OF PROCEDURE:  The patient was taken to the operating room. SCDs were placed and antibiotics were given.  General anesthesia was administered.  [name] operative site was prepped and draped in a sterile fashion. A time out was performed and all information was confirmed to be correct.  Dr. Adora Fridge performed bilateral skin sparing mastectomies.  Patient was then turned over to me.  I examined the mastectomy flaps and they clinically looked good and well perfused.  Indocyanine green angiography was then performed showing good  perfusion of flaps on both sides.  Both pockets were then inspected and meticulous hemostasis was obtained.  A 0 V-Loc was used to plicate the lateral soft tissues to close down that area.  15 French drains were placed on each side and secured with nylon sutures.  The pieces of Flex HD pliable pre were then brought onto the field.  They were soaked in saline and a 2-0 PDS was run around the periphery for a pursestring.  The expanders were then brought onto the field.  I elected to use Mentor smooth CPX 4 tissue expanders with the prescribed fill volume of 450 cc.  Serial number on the left side [id number].  Serial number on the right side [id number].  All the air was removed from both expanders and they were placed within the Flex HD and the pursestring was tied down.  Suture tabs were brought out at 3, 6, 9:00.  Both pockets were then irrigated triple antibiotic solution.  Hemostasis was again ensured.  Stay sutures were placed at 3, 6, 9:00 on both sides with 3-0 PDS.  They were then run through the suture tabs and the expanders were placed into the pocket and the sutures were tied down to secure them in position in the prepectoral space.  100 cc of saline was placed into each expander.  Skin was then closed with combination of in sorb staples and buried 3-0 PDS sutures and a running 3 OV lock suture.  Benzoin Steri-Strips and a soft dressing were applied.  The patient tolerated the procedure well.  There were no complications. The patient was allowed to wake from anesthesia, extubated and taken to the recovery room in satisfactory condition.

## 2021-05-15 NOTE — Anesthesia Procedure Notes (Signed)
Procedure Name: Intubation Date/Time: 05/15/2021 11:25 AM Performed by: Natasha Mead, CRNA Pre-anesthesia Checklist: Patient identified, Emergency Drugs available, Suction available and Patient being monitored Patient Re-evaluated:Patient Re-evaluated prior to induction Oxygen Delivery Method: Circle system utilized Preoxygenation: Pre-oxygenation with 100% oxygen Induction Type: IV induction Ventilation: Mask ventilation without difficulty Laryngoscope Size: Miller and 2 Grade View: Grade II Tube type: Oral Tube size: 7.0 mm Number of attempts: 1 Airway Equipment and Method: Stylet and Oral airway Placement Confirmation: ETT inserted through vocal cords under direct vision, positive ETCO2 and breath sounds checked- equal and bilateral Tube secured with: Tape Dental Injury: Teeth and Oropharynx as per pre-operative assessment

## 2021-05-15 NOTE — Anesthesia Preprocedure Evaluation (Addendum)
Anesthesia Evaluation  Patient identified by MRN, date of birth, ID band Patient awake    Reviewed: Allergy & Precautions, H&P , NPO status , Patient's Chart, lab work & pertinent test results  Airway Mallampati: III  TM Distance: >3 FB Neck ROM: Full    Dental no notable dental hx.    Pulmonary asthma ,    Pulmonary exam normal        Cardiovascular Exercise Tolerance: Good hypertension, Pt. on medications Normal cardiovascular exam  ECG 03/27/21: normal   Neuro/Psych  Headaches, PSYCHIATRIC DISORDERS Anxiety    GI/Hepatic negative GI ROS, Neg liver ROS,   Endo/Other  diabetes (pre-diabetes A1C 6.4)Prediabetes, obesity  Renal/GU negative Renal ROS  negative genitourinary   Musculoskeletal negative musculoskeletal ROS (+)   Abdominal   Peds negative pediatric ROS (+)  Hematology negative hematology ROS (+) Breast CA   Anesthesia Other Findings Allergy    Asthma    Breast cancer (HCC)  INVASIVE MAMMARY CARCINOMA- left  Fibroid uterus 11/15/2014   Headache    Hyperlipidemia    Hypertension    Menorrhagia with irregular cycle 11/15/2014 Pneumonia    Pre-diabetes    Shortness of breath dyspnea       Reproductive/Obstetrics negative OB ROS Hx fibroid uterus s/p hysterectomy                            Anesthesia Physical  Anesthesia Plan  ASA: 2  Anesthesia Plan: General   Post-op Pain Management:    Induction: Intravenous  PONV Risk Score and Plan: 3 and Ondansetron, Dexamethasone, Treatment may vary due to age or medical condition and Midazolam  Airway Management Planned: Oral ETT  Additional Equipment:   Intra-op Plan:   Post-operative Plan: Extubation in OR  Informed Consent: I have reviewed the patients History and Physical, chart, labs and discussed the procedure including the risks, benefits and alternatives for the proposed anesthesia with the patient or  authorized representative who has indicated his/her understanding and acceptance.     Dental advisory given  Plan Discussed with: CRNA, Anesthesiologist and Surgeon  Anesthesia Plan Comments: (Patient consented for risks of anesthesia including but not limited to:  - adverse reactions to medications - damage to eyes, teeth, lips or other oral mucosa - nerve damage due to positioning  - sore throat or hoarseness - damage to heart, brain, nerves, lungs, other parts of body or loss of life )       Anesthesia Quick Evaluation

## 2021-05-15 NOTE — Op Note (Signed)
Pre-operative Diagnosis: multifocal left invasive Breast Cancer with positive margins after lumpectomy and  Chek 2 mutation  Post-operative Diagnosis: Same   Surgeon: Caroleen Hamman,  MD FACS  Anesthesia: GETA  Procedure:  Bilateral simple mastectomies skin sparing node biopsy Immediate breast reconstruction ( performed by Dr. Claudia Desanctis , please see separate op report)  Findings: Left breast with postsurgical changes, no obvious evidence of masses or metastatic disease on either breast  Estimated Blood Loss: 25cc         Specimens: Bilateral mastectomies with labels       Complications: none                Condition: Stable   Procedure Details  The patient was seen again in the Holding Room. The benefits, complications, treatment options, and expected outcomes were discussed with the patient. The risks of bleeding, infection, recurrence of symptoms, failure to resolve symptoms, hematoma, seroma, open wound, cosmetic deformity, and the need for further surgery were discussed.  The patient was taken to Operating Room, identified  and the procedure verified.  A Time Out was held and the above information confirmed.  Prior to the induction of general anesthesia, antibiotic prophylaxis was administered. VTE prophylaxis was in place. Appropriate anesthesia was then administered and tolerated well. The chest was prepped with Chloraprep and draped in the sterile fashion. The patient was positioned in the supine position.   We started on the left side where An incision was planned in an elliptical fashion including the nipple-areolar complex. Once this was drawn on the skin with a sterile skin marker, an incision was made with a #10 blade scalpel, then extended through the remainder of the dermis using electrocautery, maintaining hemostasis as we progressed. The skin flaps were then raised superiorly, medially, inferiorly and laterally within the plane that divides the adipose that invest the skin with  the adipose that invest the breast. The skin flaps were then raised and the dissection taken down to the chest wall in the superior, medial and inferior positions. Laterally, this was taken down to the lateral border of the latissimus dorsi muscle. The breast tissue was then taken off the chest wall, including the investing fascia of the pectoralis major muscle. This was taken down in the superior, inferior, medial to lateral directions. The breast was slowly rotated laterally until the lateral border of the pectoralis major was identified. The lateral fascia was then carefully incised, and the breast was carefully taken off the axilla superficially. The specimen was then oriented with a short suture placed superiorly and a long suture placed laterally before it was passed off the field for evaluation by pathology.  Attention was then turned to the right breast where an incision was planned in an elliptical fashion including the nipple-areolar complex. Once this was drawn on the skin with a sterile skin marker, an incision was made with a #10 blade scalpel, then extended through the remainder of the dermis using electrocautery, maintaining hemostasis as we progressed. The skin flaps were then raised superiorly, medially, inferiorly and laterally within the plane that divides the adipose that invest the skin with the adipose that invest the breast. The skin flaps were then raised and the dissection taken down to the chest wall in the superior, medial and inferior positions. Laterally, this was taken down to the lateral border of the latissimus dorsi muscle. The breast tissue was then taken off the chest wall, including the investing fascia of the pectoralis major muscle. This was taken  down in the superior, inferior, medial to lateral directions. The breast was slowly rotated laterally until the lateral border of the pectoralis major was identified. The lateral fascia was then carefully incised, and the breast was  carefully taken off the axilla superficially. The specimen was then oriented with a short suture placed superiorly and a long suture placed laterally before it was passed off the field for evaluation by pathology.  The operative field was then inspected for adequacy of hemostasis. Small points of bleeding were easily controlled with electrocautery. The wound was then irrigated and inspected again. Having obtained excellent hemostasis,  The case was then turned to Dr. Claudia Desanctis for immediate reconstruction , please see his operative note for further details.   Caroleen Hamman , MD, FACS

## 2021-05-16 ENCOUNTER — Encounter: Payer: Self-pay | Admitting: Surgery

## 2021-05-16 DIAGNOSIS — I10 Essential (primary) hypertension: Secondary | ICD-10-CM | POA: Diagnosis not present

## 2021-05-16 DIAGNOSIS — Z79899 Other long term (current) drug therapy: Secondary | ICD-10-CM | POA: Diagnosis not present

## 2021-05-16 DIAGNOSIS — C50412 Malignant neoplasm of upper-outer quadrant of left female breast: Secondary | ICD-10-CM | POA: Diagnosis not present

## 2021-05-16 DIAGNOSIS — Z170 Estrogen receptor positive status [ER+]: Secondary | ICD-10-CM | POA: Diagnosis not present

## 2021-05-16 DIAGNOSIS — R7303 Prediabetes: Secondary | ICD-10-CM | POA: Diagnosis not present

## 2021-05-16 LAB — COMPREHENSIVE METABOLIC PANEL
ALT: 14 U/L (ref 0–44)
AST: 19 U/L (ref 15–41)
Albumin: 3.3 g/dL — ABNORMAL LOW (ref 3.5–5.0)
Alkaline Phosphatase: 40 U/L (ref 38–126)
Anion gap: 7 (ref 5–15)
BUN: 10 mg/dL (ref 6–20)
CO2: 23 mmol/L (ref 22–32)
Calcium: 8.6 mg/dL — ABNORMAL LOW (ref 8.9–10.3)
Chloride: 106 mmol/L (ref 98–111)
Creatinine, Ser: 0.91 mg/dL (ref 0.44–1.00)
GFR, Estimated: >60 mL/min (ref >60)
Glucose, Bld: 121 mg/dL — ABNORMAL HIGH (ref 70–99)
Potassium: 3.6 mmol/L (ref 3.5–5.1)
Sodium: 136 mmol/L (ref 135–145)
Total Bilirubin: 0.4 mg/dL (ref 0.3–1.2)
Total Protein: 6.8 g/dL (ref 6.5–8.1)

## 2021-05-16 LAB — CBC
HCT: 32.3 % — ABNORMAL LOW (ref 36.0–46.0)
Hemoglobin: 10.9 g/dL — ABNORMAL LOW (ref 12.0–15.0)
MCH: 28.3 pg (ref 26.0–34.0)
MCHC: 33.7 g/dL (ref 30.0–36.0)
MCV: 83.9 fL (ref 80.0–100.0)
Platelets: 414 10*3/uL — ABNORMAL HIGH (ref 150–400)
RBC: 3.85 MIL/uL — ABNORMAL LOW (ref 3.87–5.11)
RDW: 14.3 % (ref 11.5–15.5)
WBC: 13.6 10*3/uL — ABNORMAL HIGH (ref 4.0–10.5)
nRBC: 0 % (ref 0.0–0.2)

## 2021-05-16 LAB — HIV ANTIBODY (ROUTINE TESTING W REFLEX): HIV Screen 4th Generation wRfx: NONREACTIVE

## 2021-05-16 MED ORDER — ACETAMINOPHEN 500 MG PO TABS
ORAL_TABLET | ORAL | Status: AC
  Administered 2021-05-16: 1000 mg via ORAL
  Filled 2021-05-16: qty 2

## 2021-05-16 MED ORDER — OXYCODONE HCL 5 MG PO TABS
ORAL_TABLET | ORAL | Status: AC
  Filled 2021-05-16: qty 2

## 2021-05-16 MED ORDER — KETOROLAC TROMETHAMINE 30 MG/ML IJ SOLN
INTRAMUSCULAR | Status: AC
  Administered 2021-05-16: 30 mg via INTRAVENOUS
  Filled 2021-05-16: qty 1

## 2021-05-16 MED ORDER — CEFAZOLIN SODIUM-DEXTROSE 2-4 GM/100ML-% IV SOLN
INTRAVENOUS | Status: AC
  Administered 2021-05-16: 2 g via INTRAVENOUS
  Filled 2021-05-16: qty 100

## 2021-05-16 NOTE — Progress Notes (Signed)
Discharge instructions given to patient, instructions given with demonstration of JP drain. Pt verbalizes understanding.

## 2021-05-16 NOTE — Plan of Care (Incomplete)
Adequate for discharge.

## 2021-05-16 NOTE — Discharge Summary (Signed)
[facility] SURGICAL DISCHARGE SUMMARY  Patient ID: Tracey Huff MRN: [id number] DOB/AGE: 51-May-1972 51 y.o.  Admit date: 05/15/2021 Discharge date: 05/16/2021  Discharge Diagnoses Patient Active Problem List   Diagnosis Date Noted   Breast cancer ([place]) 05/15/2021   Carcinoma of upper-outer quadrant of left breast in female, estrogen receptor positive ([place]) 03/21/2021    Consultants Plastic Surgery - Dr Claudia Desanctis   Procedures 05/15/2021: Bilateral simple mastectomies Bilateral breast reconstruction with tissue expanders and acellular dermal matrix   HPI: Tracey Huff is a 51 y.o. 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   Hospital Course: Informed consent was obtained and documented, and patient underwent uneventful bilateral simple mastectomies and immediate reconstruction. (Dr Dahlia Byes & Dr Claudia Desanctis, 05/15/2021).  Post-operatively, patient did well. Advancement of patient's diet and ambulation were well-tolerated. The remainder of patient's hospital course was essentially unremarkable, and discharge planning was initiated accordingly with patient safely able to be discharged home with appropriate discharge instructions, antibiotics (sent pre-op by plastics), pain control, and outpatient follow-up after all of her and family's questions were answered to their expressed satisfaction.   Discharge Condition: Good   Physical Examination:  Constitutional: Well appearing female, NAD Pulmonary: Normal effort, no respiratory distress  Skin: Bilateral mastectomy incisions are Cdi with steri-strips, no erythema, no drainage. Drains present bilaterally; output in both are serosanguinous. Breast binder in place   Allergies as of 05/16/2021       Reactions    Macadamia Nut Oil Shortness Of Breath   Apple Nausea And Vomiting   [place] Nut (berthollefia Excelsa) Skin Test Nausea And Vomiting, Swelling   Tongue swelling   Carrot Oil    Can not cooked raw carrots   Fruit & Vegetable Daily [nutritional Supplements] Nausea And Vomiting   Cannot tolerate apples, peaches, plums, and nectarines   Kiwi Extract Nausea And Vomiting   Peach [prunus Persica] Nausea And Vomiting        Medication List     TAKE these medications    albuterol 108 (90 Base) MCG/ACT inhaler Commonly known as: Proventil HFA Inhale 1 to 2 puffs every 4-6 hours as needed for cough/wheeze   atorvastatin 20 MG tablet Commonly known as: LIPITOR Take 1 tablet (20 mg total) by mouth at bedtime. What changed:  how much to take when to take this additional instructions   Auvi-Q 0.3 mg/0.3 mL Soaj injection Generic drug: EPINEPHrine Inject 0.3 mg into the muscle as needed for anaphylaxis.   azelastine 0.1 % nasal spray Commonly known as: ASTELIN Place 1-2 sprays into both nostrils 2 (two) times daily. What changed:  when to take this reasons to take this   busPIRone 7.5 MG tablet Commonly known as: BUSPAR Take 1 tablet once daily at night; may take 1 additional tablet daily if needed for anxiety. What changed:  how much to take how to take this when to take this reasons to take this   Cholecalciferol 25 MCG (1000 UT) capsule Take 1,000 Units by mouth daily.   diphenhydrAMINE 2 % cream Commonly known as: BENADRYL Apply 1 application topically 2 (two) times daily as needed for itching.   ELDERBERRY PO Take 1 capsule by mouth daily.   fluticasone 50 MCG/ACT nasal spray Commonly known as: FLONASE Place 1-2 sprays into both nostrils daily. What changed:  when to take this reasons to take this   hydrocortisone cream 1 % Apply 1 application topically daily as needed for itching.   hydrOXYzine 10 MG tablet Commonly known as: ATARAX Take 1-2 tablets  (10-20 mg total) by mouth 3 (three) times daily as needed for itching (rash).   ibuprofen 200 MG tablet Commonly known as: ADVIL Take 400 mg by mouth every 6 (six) hours as needed for moderate pain.   ipratropium-albuterol 0.5-2.5 (3) MG/3ML Soln Commonly known as: DUONEB Take 3 mLs by nebulization every 6 (six) hours as needed (wheeze, SOB, cough variant asthma).   levocetirizine 5 MG tablet Commonly known as: XYZAL Take 1 tablet (5 mg total) by mouth every evening.   losartan 25 MG tablet Commonly known as: COZAAR Take 1 tablet (25 mg total) by mouth every morning.   montelukast 10 MG tablet Commonly known as: Singulair Take 1 tablet (10 mg total) by mouth every evening.   Olopatadine HCl 0.2 % Soln Commonly known as: Pataday Place 1 drop into affected eye daily. What changed:  when to take this reasons to take this   pregabalin 50 MG capsule Commonly known as: LYRICA Take 1 capsule (50 mg total) by mouth 3 (three) times daily.   Trelegy Ellipta 200-62.5-25 MCG/ACT Aepb Generic drug: Fluticasone-Umeclidin-Vilant Inhale 1 puff into the lungs daily. What changed: when to take this   vitamin C 100 MG tablet Take 100 mg by mouth daily.          Follow-up Information     Cindra Presume, MD. Go on 05/23/2021.   Specialty: Plastic Surgery Why: at 1:45 pm Contact information: [place] [address] [phone]         Jules Husbands, MD. Schedule an appointment as soon as possible for a visit.   Specialty: General Surgery Why: s/p bilateral mastectomy, has drains Contact information: [address] [place] [address] [phone]                  Time spent on discharge management including discussion of hospital course, clinical condition, outpatient instructions, prescriptions, and follow up with the patient and members of the medical team: >30 minutes  -- Edison Simon , PA-C [facility]  05/16/2021, 8:35 AM [phone] M-F: 7am - 4pm

## 2021-05-17 NOTE — Addendum Note (Signed)
Addendum  created 05/17/21 2020 by Iran Ouch, MD   Clinical Note Signed

## 2021-05-17 NOTE — Anesthesia Postprocedure Evaluation (Addendum)
Anesthesia Post Note  Patient: Tracey Huff  Procedure(s) Performed: TOTAL MASTECTOMY, Bilateral Simple Mastectomy, skin sparing (Bilateral) BILATERAL BREAST RECONSTRUCTION WITH PLACEMENT OF TISSUE EXPANDER AND FLEX HD (ACELLULAR HYDRATED DERMIS) (Bilateral) INDOCYANINE GREEN FLUORESCENCE IMAGING (ICG)  Patient location during evaluation: PACU Anesthesia Type: General Level of consciousness: awake and alert Pain management: pain level controlled Vital Signs Assessment: post-procedure vital signs reviewed and stable Respiratory status: spontaneous breathing, nonlabored ventilation and respiratory function stable Cardiovascular status: blood pressure returned to baseline and stable Postop Assessment: no apparent nausea or vomiting Anesthetic complications: no   No notable events documented.   Last Vitals:  Vitals:   05/16/21 0500 05/16/21 0740  BP: (!) 157/89 (!) 167/84  Pulse: 72 64  Resp: 19 17  Temp: (!) 36 C (!) 36.3 C  SpO2: 100% 99%    Last Pain:  Vitals:   05/16/21 0911  TempSrc:   PainSc: [place]

## 2021-05-21 LAB — SURGICAL PATHOLOGY

## 2021-05-23 ENCOUNTER — Other Ambulatory Visit: Payer: Self-pay

## 2021-05-23 ENCOUNTER — Ambulatory Visit (INDEPENDENT_AMBULATORY_CARE_PROVIDER_SITE_OTHER): Payer: 59 | Admitting: Plastic Surgery

## 2021-05-23 ENCOUNTER — Ambulatory Visit (INDEPENDENT_AMBULATORY_CARE_PROVIDER_SITE_OTHER): Payer: 59 | Admitting: Surgery

## 2021-05-23 ENCOUNTER — Encounter: Payer: Self-pay | Admitting: Surgery

## 2021-05-23 VITALS — BP 148/84 | HR 87 | Temp 98.0°F | Ht 60.0 in | Wt 175.0 lb

## 2021-05-23 DIAGNOSIS — C50812 Malignant neoplasm of overlapping sites of left female breast: Secondary | ICD-10-CM

## 2021-05-23 DIAGNOSIS — Z09 Encounter for follow-up examination after completed treatment for conditions other than malignant neoplasm: Secondary | ICD-10-CM

## 2021-05-23 DIAGNOSIS — C50412 Malignant neoplasm of upper-outer quadrant of left female breast: Secondary | ICD-10-CM

## 2021-05-23 DIAGNOSIS — Z170 Estrogen receptor positive status [ER+]: Secondary | ICD-10-CM

## 2021-05-23 NOTE — Progress Notes (Signed)
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 Drain in place  PE NAD Chest wall incisions healing well, serous drainage from JPs, steri strips in place  A/P Doing well W/o complications RTC 3 weeks

## 2021-05-23 NOTE — Progress Notes (Signed)
Patient presents 1 week out from bilateral breast reconstruction with tissue expanders and acellular dermal matrix.  She feels like things are coming along well.  Her drains are putting out between 20 and 30 cc/day for the last couple days.  On exam her skin looks healthy and I do not see any signs of wound healing problem.  We will plan to leave the drains in for another week to encourage the ADM to incorporate and I suspect we will be able to remove them next week.  After that we can start with fills if everything continues to look good.  All of her questions were answered.

## 2021-05-23 NOTE — Patient Instructions (Signed)
Follow up here in about 3 weeks.   Defer to Dr Claudia Desanctis about drain care and when you can remove your binder.

## 2021-05-28 NOTE — Progress Notes (Deleted)
Patient is a 51 year old female with PMH of left breast cancer s/p immediate reconstruction performed 05/15/2021 by Dr. Claudia Desanctis who presents to clinic for postoperative follow-up.  She was last seen here in clinic on 05/23/2021.  At that time, drains were putting out 20 to 30 cc/day.  Exam was largely reassuring.  Plan was for drains to remain in place for an additional week to allow for ADM incorporation.  Today,   Drain removal.  After that, can consider fills... maybe subsequent?

## 2021-05-29 ENCOUNTER — Inpatient Hospital Stay: Payer: 59

## 2021-05-29 ENCOUNTER — Other Ambulatory Visit: Payer: Self-pay

## 2021-05-29 ENCOUNTER — Inpatient Hospital Stay: Payer: 59 | Attending: Internal Medicine | Admitting: Internal Medicine

## 2021-05-29 ENCOUNTER — Encounter: Payer: Self-pay | Admitting: Internal Medicine

## 2021-05-29 ENCOUNTER — Telehealth: Payer: Self-pay

## 2021-05-29 DIAGNOSIS — Z170 Estrogen receptor positive status [ER+]: Secondary | ICD-10-CM | POA: Insufficient documentation

## 2021-05-29 DIAGNOSIS — C50412 Malignant neoplasm of upper-outer quadrant of left female breast: Secondary | ICD-10-CM | POA: Insufficient documentation

## 2021-05-29 DIAGNOSIS — Z79899 Other long term (current) drug therapy: Secondary | ICD-10-CM | POA: Diagnosis not present

## 2021-05-29 NOTE — Progress Notes (Signed)
Patient reports a decline in appetite.
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
## 2021-05-29 NOTE — Assessment & Plan Note (Addendum)
#  Left breast cancer T15m0- G-2; ER/PR positive HER2 negative status postmastectomy.  Reviewed no role for radiation since patient is status postmastectomy.  #Discussed the role of adjuvant chemotherapy/endocrine therapy.  Since tumor is 10 mm; grade 2 recommend Oncotype testing.  Discussed the prognostic/predictive rational for the testing in detail. .  We will make decisions on chemotherapy based on the results of the Oncotype testing. Patient agreement.  We will order Oncotype testing today.  Suspect clinical low risk of recurrence.  #Discussed the role of anti-hormonal therapy mechanism of action- tamoxifen.  I would recommend tamoxifen for 5 years.  Long discussion regarding the potential adverse events on tamoxifen including but not limited to hot flashes, mood swings, thromboembolic events strokes and also small risk of uterine cancers.  I do not think patient had a spell of any serious side effects-given lack of other comorbidities/also s/p hysterectomy.  # Genetic testing: check-2 positive.  S/p genetic counseling.  # DISPOSITION:  # blood work today.  # follow up in 3 weeks; MD; no labs- Dr.B

## 2021-05-29 NOTE — Telephone Encounter (Signed)
Oncotype order submitted online (order [id number]) on accession: [id number].

## 2021-05-30 ENCOUNTER — Ambulatory Visit: Payer: 59 | Admitting: Physician Assistant

## 2021-05-30 ENCOUNTER — Encounter: Payer: Self-pay | Admitting: Physician Assistant

## 2021-05-30 DIAGNOSIS — C50412 Malignant neoplasm of upper-outer quadrant of left female breast: Secondary | ICD-10-CM

## 2021-05-30 DIAGNOSIS — Z170 Estrogen receptor positive status [ER+]: Secondary | ICD-10-CM

## 2021-05-30 LAB — ESTRADIOL: Estradiol: 345 pg/mL

## 2021-05-30 LAB — FSH/LH
FSH: 4.2 m[IU]/mL
LH: 20.3 m[IU]/mL

## 2021-05-30 NOTE — Progress Notes (Signed)
Patient is a 51 year old female with PMH of left-sided breast cancer s/p bilateral breast reconstruction with tissue expanders and Flex HD performed 05/15/2021 by Dr. Claudia Desanctis.  Patient had 100 cc normal saline placed in each 450 cc expander.  Patient was seen for initial postop visit 05/23/2021.  At that time, exam was reassuring.  Drainage reported 20 to 30 cc/day.  Plan was to leave the drains in place for another week to encourage ADM incorporation and then proceed with expander fills if exam is reassuring.   Today, patient is doing well.  Denies complaints.  She has not taken any pain medication except for Tylenol.  She feels excited to have her drains removed.  Reviewed her log and she is only draining approximately 15 to 25 cc/day per drain for the past 1 week.  Physical exam entirely reassuring.  No areas concerning for subcutaneous fluid collection.  No erythema or swelling.  Steri-Strips remain intact.  No obvious wounds appreciated.  JP drains removed without difficulty or complication.  Dressed with Vaseline and gauze.  We placed injectable saline in the Expander using a sterile technique: Right: 50 cc for a total of 150/450 cc Left: 50 cc for a total of 150/450 cc

## 2021-05-31 DIAGNOSIS — C50412 Malignant neoplasm of upper-outer quadrant of left female breast: Secondary | ICD-10-CM | POA: Diagnosis not present

## 2021-05-31 DIAGNOSIS — Z9011 Acquired absence of right breast and nipple: Secondary | ICD-10-CM | POA: Diagnosis not present

## 2021-06-01 ENCOUNTER — Telehealth: Payer: Self-pay | Admitting: *Deleted

## 2021-06-01 NOTE — Telephone Encounter (Signed)
Received on (05/31/2021) via of fax DME Standard Written Order.  Requesting signature,date and return.  Given to the provider to review.    DME Standard Written Order reviewed and signed.  Faxed back to [facility].  Confirmation received and copy scanned into the chart.//AB/CMA

## 2021-06-04 NOTE — Progress Notes (Signed)
Patient is a 51 year old female with PMH of left-sided breast cancer s/p bilateral breast reconstruction with tissue expanders and Flex HD performed 05/15/2021 by Dr. Claudia Desanctis.    She was last seen here in clinic 05/30/2021.  JP drains removed without complication or difficulty.  Saline placed in expanders for a total of 150/450 cc each side.  Today, patient is doing well.  She states that she has had difficulty sleeping recently.  She states that she is not typically a back sleeper.  She has been using a wedge and supporting pillows, but has simply not been able to remain comfortable in order to get enough good sleep.  She reports that she has not had to take any of the pain medications and otherwise is without any complaints.  She picked up a few bras from [employer] which she states have been much more comfortable.  Physical exam entirely reassuring.  Steri-Strips remain firmly intact.  She asked that we hold off on removal until next appointment.  No obvious subcutaneous fluid collections appreciated on exam.  JP drain insertion sites have already healed nicely.  We placed injectable saline in the Expander using a sterile technique: Right: 50 cc for a total of 200 / 450 cc Left: 50 cc for a total of 200 / 450 cc  Will prescribe a short course of hydroxyzine to see if that helps with her sleeping offered her a short course of hydroxyzine to see if that helps her stay asleep at night.  She was informed that this is a first-generation antihistamine and will make her drowsy.  She would like to proceed.  Follow-up in 2 weeks for follow-up and likely expander fill.

## 2021-06-05 NOTE — Telephone Encounter (Signed)
Checked the status of Oncotype testing, in progress with estimate report date of 06/14/21

## 2021-06-06 ENCOUNTER — Other Ambulatory Visit: Payer: Self-pay

## 2021-06-06 ENCOUNTER — Ambulatory Visit (INDEPENDENT_AMBULATORY_CARE_PROVIDER_SITE_OTHER): Payer: 59 | Admitting: Physician Assistant

## 2021-06-06 DIAGNOSIS — C50412 Malignant neoplasm of upper-outer quadrant of left female breast: Secondary | ICD-10-CM | POA: Diagnosis not present

## 2021-06-06 DIAGNOSIS — Z9889 Other specified postprocedural states: Secondary | ICD-10-CM

## 2021-06-06 DIAGNOSIS — Z170 Estrogen receptor positive status [ER+]: Secondary | ICD-10-CM | POA: Diagnosis not present

## 2021-06-06 MED ORDER — HYDROXYZINE PAMOATE 25 MG PO CAPS: 25.0000 mg | ORAL_CAPSULE | Freq: Every evening | ORAL | 0 refills | Status: DC | PRN

## 2021-06-07 ENCOUNTER — Encounter: Payer: Self-pay | Admitting: Internal Medicine

## 2021-06-07 NOTE — Telephone Encounter (Signed)
Results scanned in chart 

## 2021-06-08 ENCOUNTER — Telehealth: Payer: Self-pay | Admitting: *Deleted

## 2021-06-08 NOTE — Telephone Encounter (Signed)
Opened phone note in error 

## 2021-06-11 NOTE — Progress Notes (Signed)
°  I called patient to discuss results of the oncotype. unable to reach. Left a voicemail to call us back. Follow up as planned.

## 2021-06-12 ENCOUNTER — Telehealth: Payer: Self-pay | Admitting: *Deleted

## 2021-06-12 NOTE — Telephone Encounter (Signed)
06/08/2021-received [employer] Cancer/critical illness claim form  06/12/2021-RN completed for patient. Pending Dr. Aletha Halim signature- RN will fax to [facility]    *also note- for Dr. Sharmaine Base team- patient needs referral to [facility]. Please discuss with patient on 2/14 apt.-    Pt needs OT screen to prevent lymphadema & pain from scar tissue. Gwenette Greet also needs to educate pt on range of motion exercises post op. RN has left msgs re: need for OT screen. Patient has seen the msg on mychart, but didn't respond back to [place], Therapist, sports.

## 2021-06-13 ENCOUNTER — Ambulatory Visit (INDEPENDENT_AMBULATORY_CARE_PROVIDER_SITE_OTHER): Payer: 59 | Admitting: Surgery

## 2021-06-13 ENCOUNTER — Encounter: Payer: Self-pay | Admitting: Surgery

## 2021-06-13 ENCOUNTER — Other Ambulatory Visit: Payer: Self-pay

## 2021-06-13 VITALS — BP 157/96 | HR 96 | Temp 98.9°F | Ht 60.0 in | Wt 177.0 lb

## 2021-06-13 DIAGNOSIS — Z09 Encounter for follow-up examination after completed treatment for conditions other than malignant neoplasm: Secondary | ICD-10-CM

## 2021-06-13 DIAGNOSIS — C50812 Malignant neoplasm of overlapping sites of left female breast: Secondary | ICD-10-CM

## 2021-06-13 NOTE — Patient Instructions (Addendum)
Follow up here in 3 months. We will send you a letter about this appointment.  You may return to work tomorrow.

## 2021-06-14 NOTE — Telephone Encounter (Signed)
Send copy of form to HIM and keep original in case pt wants the form personally. Pt didn't indicate her preference.

## 2021-06-14 NOTE — Telephone Encounter (Signed)
done

## 2021-06-15 NOTE — Progress Notes (Signed)
Tracey Huff is following up after bilateral mastectomies and recon 05/15/21  She is doing very welll.  No complaints  Oncology to discus onco type DX results w ptn   PE NAD Chest wall incisions healing well, , steri strips in place   A/P Doing well W/o complications RTC 8 weeks

## 2021-06-19 ENCOUNTER — Inpatient Hospital Stay: Payer: 59 | Attending: Internal Medicine | Admitting: Internal Medicine

## 2021-06-19 ENCOUNTER — Other Ambulatory Visit: Payer: Self-pay

## 2021-06-19 ENCOUNTER — Encounter: Payer: Self-pay | Admitting: Internal Medicine

## 2021-06-19 ENCOUNTER — Other Ambulatory Visit (HOSPITAL_COMMUNITY): Payer: Self-pay

## 2021-06-19 DIAGNOSIS — Z79899 Other long term (current) drug therapy: Secondary | ICD-10-CM | POA: Diagnosis not present

## 2021-06-19 DIAGNOSIS — Z170 Estrogen receptor positive status [ER+]: Secondary | ICD-10-CM | POA: Diagnosis not present

## 2021-06-19 DIAGNOSIS — C50812 Malignant neoplasm of overlapping sites of left female breast: Secondary | ICD-10-CM | POA: Insufficient documentation

## 2021-06-19 DIAGNOSIS — C50412 Malignant neoplasm of upper-outer quadrant of left female breast: Secondary | ICD-10-CM | POA: Diagnosis not present

## 2021-06-19 DIAGNOSIS — Z79810 Long term (current) use of selective estrogen receptor modulators (SERMs): Secondary | ICD-10-CM | POA: Diagnosis not present

## 2021-06-19 MED ORDER — TAMOXIFEN CITRATE 20 MG PO TABS
20.0000 mg | ORAL_TABLET | Freq: Every day | ORAL | 2 refills | Status: DC
  Filled 2021-06-19: qty 30, 30d supply, fill #0
  Filled 2021-07-18: qty 30, 30d supply, fill #1
  Filled 2021-08-15: qty 30, 30d supply, fill #2
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
## 2021-06-19 NOTE — Assessment & Plan Note (Addendum)
#  Left breast cancer T84mN0- G-2; ER/PR positive HER2 negative status postmastectomy [positive for CHEK-2 mutation].  Reviewed no role for radiation since patient is status postmastectomy.  #Oncotype recurrence score 16-which translates to about 6% risk of recurrence with endocrine therapy only.  The benefit from chemotherapy is less than 1%.  #Again reviewed the role of adjuvant endocrine therapy. Discussed the role of anti-hormonal therapy mechanism of action- tamoxifen.  I would recommend tamoxifen for 5 years.  Long discussion regarding the potential adverse events on tamoxifen including but not limited to hot flashes, mood swings, thromboembolic events strokes and also small risk of uterine cancers.  I do not think patient had a spell of any serious side effects-given lack of other comorbidities/also s/p hysterectomy [intact ovaries]   # DISPOSITION:  # follow up 2 months- MD; no labs- Dr.B

## 2021-06-19 NOTE — Progress Notes (Signed)
Patient denies new problems/concerns today.   °

## 2021-06-20 ENCOUNTER — Ambulatory Visit (INDEPENDENT_AMBULATORY_CARE_PROVIDER_SITE_OTHER): Payer: 59 | Admitting: Plastic Surgery

## 2021-06-20 ENCOUNTER — Other Ambulatory Visit: Payer: Self-pay

## 2021-06-20 ENCOUNTER — Encounter: Payer: Self-pay | Admitting: Plastic Surgery

## 2021-06-20 DIAGNOSIS — Z9889 Other specified postprocedural states: Secondary | ICD-10-CM

## 2021-06-20 NOTE — Progress Notes (Signed)
Patient presents for continued expansion of her tissue expanders.  She feels like things are going well.  She currently has 200 cc and 450 cc expanders.  Today I infiltrated 100 cc on each side which gives her a current volume of 300 cc and 450 cc expanders.  She tolerated this fine.  We will plan to see her again in 2 weeks for another fill.  All of her questions were answered.

## 2021-06-27 ENCOUNTER — Telehealth: Payer: Self-pay | Admitting: *Deleted

## 2021-06-27 NOTE — Telephone Encounter (Signed)
[place] Form to [employer] at [phone]

## 2021-06-28 ENCOUNTER — Other Ambulatory Visit: Payer: Self-pay | Admitting: Physician Assistant

## 2021-06-28 DIAGNOSIS — J3081 Allergic rhinitis due to animal (cat) (dog) hair and dander: Secondary | ICD-10-CM | POA: Diagnosis not present

## 2021-06-28 DIAGNOSIS — J301 Allergic rhinitis due to pollen: Secondary | ICD-10-CM | POA: Diagnosis not present

## 2021-06-28 DIAGNOSIS — J3089 Other allergic rhinitis: Secondary | ICD-10-CM | POA: Diagnosis not present

## 2021-07-02 NOTE — Progress Notes (Signed)
Patient is a pleasant 51 year old female with PMH of left-sided breast cancer s/p bilateral breast reconstruction with tissue expanders and Flex HD performed 05/15/2021 by Dr. Claudia Desanctis.   ? ?Patient was last seen here in the office on 06/20/2021.  At that time, exam was reassuring and patient was doing well.  100 cc was injected into each expander for a total of 300/450 cc each side.  Plan was for her to return in a couple of weeks for repeat expander fill. ? ?Today, patient is doing well.  She has started occupational therapy and will be going once every 2 weeks.  She still will occasionally take NSAIDs and Tylenol as needed for discomfort, particular right after an expander fill.  Denies any redness, fevers, or unusual skin changes. ? ?She states that she was a 36 B cup prior to her mastectomy with immediate reconstruction.  She will try to continue gauging what expander size is appropriate prior to her implant exchange.  Would like to proceed with additional fill here today. ? ?Physical exam is entirely reassuring.  No subcutaneous fluid collections or cellulitic changes. ? ?We placed injectable saline in the Expander using a sterile technique: ?Right: 70 cc for a total of 370 / 450 cc ?Left: 70 cc for a total of 370 / 450 cc ? ?Patient to return in 2 weeks for repeat expander fill and at that time determine whether or not she would like to proceed with implant change. ?

## 2021-07-04 ENCOUNTER — Other Ambulatory Visit: Payer: Self-pay

## 2021-07-04 ENCOUNTER — Inpatient Hospital Stay (HOSPITAL_BASED_OUTPATIENT_CLINIC_OR_DEPARTMENT_OTHER): Payer: 59 | Admitting: Hospice and Palliative Medicine

## 2021-07-04 ENCOUNTER — Inpatient Hospital Stay: Payer: 59 | Attending: Internal Medicine | Admitting: Occupational Therapy

## 2021-07-04 DIAGNOSIS — C50412 Malignant neoplasm of upper-outer quadrant of left female breast: Secondary | ICD-10-CM | POA: Diagnosis not present

## 2021-07-04 DIAGNOSIS — L905 Scar conditions and fibrosis of skin: Secondary | ICD-10-CM

## 2021-07-04 DIAGNOSIS — Z170 Estrogen receptor positive status [ER+]: Secondary | ICD-10-CM

## 2021-07-04 DIAGNOSIS — M25612 Stiffness of left shoulder, not elsewhere classified: Secondary | ICD-10-CM

## 2021-07-04 DIAGNOSIS — Z79810 Long term (current) use of selective estrogen receptor modulators (SERMs): Secondary | ICD-10-CM | POA: Insufficient documentation

## 2021-07-04 NOTE — Progress Notes (Signed)
Palliative Medicine [facility] at [facility] [phone] [phone]   Name: Tracey Huff Date: 07/04/2021 MRN: [id number]  DOB: 06/28/1970  Patient Care Team: Berniece Salines, FNP as PCP - General (Nurse Practitioner) Benita Gutter, RN as Oncology Nurse Navigator Scarlett Presto, RN as Oncology Nurse Navigator Pabon, Merri Ray, MD as Consulting Physician (General Surgery) Earna Coder, MD as Consulting Physician (Oncology) Allena Napoleon, MD as Consulting Physician (Plastic Surgery)    REASON FOR CONSULTATION: Tracey Huff is a 51 y.o. 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  Cancer was initially diagnosed 03/21/2021 and patient underwent bilateral mastectomies on 05/15/2021.  She requested palliative care consult.  SOCIAL HISTORY:     reports that she has never smoked. She has never been exposed to tobacco smoke. She has never used smokeless tobacco. She reports that she does not currently use alcohol. She reports that she does not use drugs.  Patient is married and lives at home with her husband.  He has a glioblastoma and is disabled.  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  ADVANCE DIRECTIVES:  None on file  CODE STATUS:   PAST MEDICAL HISTORY: Past Medical History:  Diagnosis Date   Allergy    Asthma    Breast cancer (HCC)    INVASIVE MAMMARY CARCINOMA- left   Family history of prostate cancer    Fibroid uterus 11/15/2014   Headache    Hyperlipidemia    Hypertension    Menorrhagia with irregular cycle 11/15/2014   Pneumonia    Pre-diabetes    Shortness of breath dyspnea     PAST SURGICAL HISTORY:  Past Surgical History:  Procedure Laterality Date   ABDOMINAL HYSTERECTOMY N/A 11/15/2014   Procedure: HYSTERECTOMY ABDOMINAL/BILATERAL SALPINGECTOMY ;  Surgeon: Conard Novak, MD;  Location:  [facility] OR[facility];  Service: Gynecology;  Laterality: N/A;   BREAST BIOPSY Right    2019 negative   BREAST BIOPSY Left 03/09/2021   u/s biopsy, 12-1 o'clock, VENUS" clip-path pending   BREAST LUMPECTOMY,RADIO FREQ LOCALIZER,AXILLARY SENTINEL LYMPH NODE BIOPSY Left 03/28/2021   Procedure: BREAST LUMPECTOMY,RADIO FREQ LOCALIZER,AXILLARY SENTINEL LYMPH NODE BIOPSY;  Surgeon: Leafy Ro, MD;  Location: [facility];  Service: General;  Laterality: Left;   BREAST RECONSTRUCTION WITH PLACEMENT OF TISSUE EXPANDER AND FLEX HD (ACELLULAR HYDRATED DERMIS) Bilateral 05/15/2021   Procedure: BILATERAL BREAST RECONSTRUCTION WITH PLACEMENT OF TISSUE EXPANDER AND FLEX HD (ACELLULAR HYDRATED DERMIS);  Surgeon: Allena Napoleon, MD;  Location: [facility];  Service: Plastics;  Laterality: Bilateral;   CESAREAN SECTION  1990   COLPOSCOPY  07/28/2018   CYSTOSCOPY N/A 11/15/2014   Procedure: CYSTOSCOPY;  Surgeon: Conard Novak, MD;  Location: [facility];  Service: Gynecology;  Laterality: N/A;   DILATION AND CURETTAGE OF UTERUS     GANGLION CYST EXCISION Left    TOTAL MASTECTOMY Bilateral 05/15/2021   Procedure: TOTAL MASTECTOMY, Bilateral Simple Mastectomy, skin sparing;  Surgeon: Leafy Ro, MD;  Location: [facility];  Service: General;  Laterality: Bilateral;  Provider requesting for 2 hours / 120 min for procedure.   UTERINE FIBROID SURGERY      HEMATOLOGY/ONCOLOGY HISTORY:  Oncology History Overview Note  # On physical exam, I palpate a focal firm 2 x 5 cm masslike area over the 11 to 1:30 position of the left breast approximately 2-4 cm from the nipple.  Targeted ultrasound is performed, showing no discrete focal abnormality over the upper mid to outer left breast. There is a pattern of dense fibroglandular tissue over this area which appears slightly different from the dense fibroglandular tissue elsewhere in the left breast. There is hazy decreased echogenicity with subtle shadowing within this dense tissue  at the 12 to 1 o'clock position. Some of this vague hazy decreased echogenicity with shadowing is present at the 12 o'clock position 4 cm from the nipple more focal with harmonics and measures approximately 1.4 x 1.6 x 2 cm. There is another more focal area of decreased echogenicity and shadowing at the 1 o'clock position of the left breast 2 cm from the nipple measuring 1.4 x 1.5 x 1.5 cm. These 2 areas appear to be contiguous and are likely part of the same process.  1.4 x 1.6 x 2 cm. There is another more focal area of decreased echogenicity and shadowing at the 1 o'clock position of the left breast 2 cm from the nipple measuring 1.4 x 1.5 x 1.5 cm. These 2 areas appear to be contiguous and are likely part of the same process.  DIAGNOSIS:  A. 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   Size of invasive carcinoma: 2 mm in this sample  Histologic grade of invasive carcinoma: Grade 2                       Glandular/tubular differentiation score: 3                       Nuclear pleomorphism score: 2                       Mitotic rate score: 1                       Total score: 6  Ductal carcinoma in situ: Present, intermediate grade  Lymphovascular invasion: Not identified    Ultrasound the left axilla is normal. CASE SUMMARY: BREAST BIOMARKER TESTS  Estrogen Receptor (ER) Status: POSITIVE          Percentage of cells with nuclear positivity: 90-100%          Average intensity of staining: Strong   Progesterone Receptor (PgR) Status: POSITIVE          Percentage of cells with nuclear positivity: 90-100%          Average intensity of staining: Strong   HER2 (by immunohistochemistry): NEGATIVE (Score 1+)  Ki-67: Not performed  Procedure: Simple mastectomy  Specimen Laterality: Left (bilateral mastectomy, tumor in left)   TUMOR  Histologic Type: Invasive mammary carcinoma, no special type  Histologic Grade (Not[place]  Histologic Score)                       Glandular (Acinar)/Tubular Differentiation: 2                       Nuclear Pleomorphism: 2                       Mitotic Rate: 2                       Overall Grade: 2  Tumor Size: 10 mm  Ductal Carcinoma In Situ (DCIS): Present, with extensive intraductal  component  Lymphovascular Invasion: Not identified  Treatment Effect in the Breast: No known presurgical treatment   MARGINS  Margin Status for Invasive Carcinoma: All margins negative for invasive  carcinoma                       Distance from invasive carcinoma to closest margin:  1.2 cm, posterior   Margin Status for DCIS: Margin(s) Involved by DCIS: All margins negative  for DCIS                       Distance from DCIS to closest margin: 1.2 cm,  posterior   REGIONAL LYMPH NODES  Regional Lymph Node Status: All regional lymph nodes negative for tumor                       Total Number of Lymph Nodes Examined (sentinel and  non-sentinel): 3                       Number of Sentinel Nodes Examined: 3   DISTANT METASTASIS  Distant Site(s) Involved, if applicable: Not applicable   PATHOLOGIC STAGE CLASSIFICATION (pTNM, AJCC 8th Edition):  TNM Descriptors: M (multifocal)  pT Category: pT1b  Regional Lymph Nodes Modifier: sn  pN Category: pN0  pM Category: Not applicable   SPECIAL STUDIES  Breast Biomarker Testing Performed on Previous Biopsy: [id number]   Estrogen Receptor (ER) Status: POSITIVE  Progesterone Receptor (PgR) Status: POSITIVE  HER2 (by immunohistochemistry): NEGATIVE (Score 1+)   #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  #June 19, 2021-tamoxifen 20 mg a day [ estrogen/LH-?  Postmenopausal].   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 VUS in RAD51D called c.434G> and in [facility] called c.476C>T also identified. The final report date is 05/09/2020.  The CancerNext-Expanded + RNAinsight gene panel offered by [employer] and includes sequencing and rearrangement analysis for the following 77 genes: IP, ALK, APC*, ATM*, AXIN2, BAP1, BARD1, BLM, BMPR1A, BRCA1*, BRCA2*, BRIP1*, CDC73, CDH1*,CDK4, CDKN1B, CDKN2A, CHEK2*, CTNNA1, DICER1, FANCC, FH, FLCN, GALNT12, KIF1B, LZTR1, MAX, MEN1, MET, MLH1*, MSH2*, MSH3, MSH6*, MUTYH*, NBN, NF1*, NF2, NTHL1, PALB2*, PHOX2B, PMS2*, POT1, PRKAR1A, PTCH1, PTEN*, RAD51C*, RAD51D*,RB1, RECQL, RET, SDHA, SDHAF2, SDHB, SDHC, SDHD, SMAD4, SMARCA4, SMARCB1, SMARCE1, STK11, SUFU, TMEM127, TP53*,TSC1, TSC2, VHL and XRCC2 (sequencing and deletion/duplication); EGFR, EGLN1, HOXB13, KIT, MITF, PDGFRA, POLD1 and POLE (sequencing only); EPCAM and GREM1 (deletion/duplication only).    05/29/2021 Cancer Staging   Staging form: Breast, AJCC 8th Edition - Pathologic: pT1b, pN0, cM0, ER+, PR+, HER2- - Signed by Earna Coder, MD on 05/29/2021 Nuclear grade: G2      ALLERGIES:  is allergic to macadamia nut oil, apple juice, [place] nut (berthollefia excelsa) skin test, carrot oil, fruit & vegetable daily [nutritional supplements], kiwi extract, and peach [prunus persica].  MEDICATIONS:  Current Outpatient Medications  Medication Sig Dispense Refill   albuterol (PROVENTIL HFA) 108 (90 Base) MCG/ACT inhaler Inhale 1 to 2 puffs every 4-6 hours as needed for cough/wheeze 18 g 0   Ascorbic  Acid (VITAMIN C) 100 MG tablet Take 100 mg by mouth daily.     atorvastatin (LIPITOR) 20 MG tablet Take 1 tablet (20 mg total) by mouth at bedtime. (Patient  taking differently: Take 10 mg by mouth every other day. Bedtime) 90 tablet 3   AUVI-Q 0.3 MG/0.3ML SOAJ injection Inject 0.3 mg into the muscle as needed for anaphylaxis.     azelastine (ASTELIN) 0.1 % nasal spray Place 1-2 sprays into both nostrils 2 (two) times daily. (Patient taking differently: Place 1-2 sprays into both nostrils daily as needed for rhinitis or allergies.) 30 mL 6   busPIRone (BUSPAR) 7.5 MG tablet Take 1 tablet once daily at night; may take 1 additional tablet daily if needed for anxiety. (Patient taking differently: Take 7.5 mg by mouth as needed. Take 1 tablet once daily at night; may take 1 additional tablet daily if needed for anxiety.) 180 tablet 1   Cholecalciferol 25 MCG (1000 UT) capsule Take 1,000 Units by mouth daily.     diphenhydrAMINE (BENADRYL) 2 % cream Apply 1 application topically 2 (two) times daily as needed for itching.     ELDERBERRY PO Take 1 capsule by mouth daily.     fluticasone (FLONASE) 50 MCG/ACT nasal spray Place 1-2 sprays into both nostrils daily. (Patient taking differently: Place 1-2 sprays into both nostrils daily as needed for allergies or rhinitis.) 16 g 6   Fluticasone-Umeclidin-Vilant (TRELEGY ELLIPTA) 200-62.5-25 MCG/ACT AEPB Inhale 1 puff into the lungs daily. (Patient taking differently: Inhale 1 puff into the lungs every morning.) 60 each 6   HYDROcodone-acetaminophen (NORCO/VICODIN) 5-325 MG tablet Take 1 tablet by mouth every 6 (six) hours as needed for moderate pain.     hydrocortisone cream 1 % Apply 1 application topically daily as needed for itching.     hydrOXYzine (VISTARIL) 25 MG capsule TAKE 1 CAPSULE (25 MG TOTAL) BY MOUTH AT BEDTIME AS NEEDED FOR UP TO 30 DOSES (DIFFICULTY SLEEPING). 90 capsule 1   ibuprofen (ADVIL) 200 MG tablet Take 400 mg by mouth every 6 (six) hours as needed for moderate pain.     ipratropium-albuterol (DUONEB) 0.5-2.5 (3) MG/3ML SOLN Take 3 mLs by nebulization every 6 (six) hours as needed (wheeze, SOB,  cough variant asthma). 360 mL 1   levocetirizine (XYZAL) 5 MG tablet Take 1 tablet (5 mg total) by mouth every evening. 30 tablet 6   losartan (COZAAR) 25 MG tablet Take 1 tablet (25 mg total) by mouth every morning. 90 tablet 1   montelukast (SINGULAIR) 10 MG tablet Take 1 tablet (10 mg total) by mouth every evening. 30 tablet 6   Olopatadine HCl (PATADAY) 0.2 % SOLN Place 1 drop into affected eye daily. (Patient taking differently: Place 1 drop into both eyes daily as needed (itching).) 2.5 mL 6   pregabalin (LYRICA) 50 MG capsule Take 1 capsule (50 mg total) by mouth 3 (three) times daily. 30 capsule 0   tamoxifen (NOLVADEX) 20 MG tablet Take 1 tablet (20 mg total) by mouth daily. 30 tablet 2   No current facility-administered medications for this visit.    VITAL SIGNS: LMP 11/15/2014  There were no vitals filed for this visit.  Estimated body mass index is 35.23 kg/m as calculated from the following:   Height as of 06/13/21: 5' (1.524 m).   Weight as of 06/19/21: 180 lb 6.4 oz (81.8 kg).  LABS: CBC:    Component Value Date/Time   WBC 13.6 (H) 05/16/2021 0633   HGB 10.9 (  L) 05/16/2021 0633   HGB 12.9 08/24/2014 1751   HCT 32.3 (L) 05/16/2021 0633   HCT 39.3 08/24/2014 1751   PLT 414 (H) 05/16/2021 0633   PLT 425 08/24/2014 1751   MCV 83.9 05/16/2021 0633   MCV 83 08/24/2014 1751   NEUTROABS 5,321 05/09/2021 1009   NEUTROABS 11.4 (H) 05/11/2011 1157   LYMPHSABS 2,346 05/09/2021 1009   LYMPHSABS 2.1 05/11/2011 1157   MONOABS 1.0 (H) 05/11/2011 1157   EOSABS 221 05/09/2021 1009   EOSABS 0.2 05/11/2011 1157   BASOSABS 60 05/09/2021 1009   BASOSABS 0.0 05/11/2011 1157   Comprehensive Metabolic Panel:    Component Value Date/Time   NA 136 05/16/2021 0633   NA 139 08/24/2014 1751   K 3.6 05/16/2021 0633   K 3.3 (L) 08/24/2014 1751   CL 106 05/16/2021 0633   CL 101 08/24/2014 1751   CO2 23 05/16/2021 0633   CO2 27 08/24/2014 1751   BUN 10 05/16/2021 0633   BUN 16  08/24/2014 1751   CREATININE 0.91 05/16/2021 0633   CREATININE 0.82 05/09/2021 1009   GLUCOSE 121 (H) 05/16/2021 0633   GLUCOSE 100 (H) 08/24/2014 1751   CALCIUM 8.6 (L) 05/16/2021 0633   CALCIUM 9.8 08/24/2014 1751   AST 19 05/16/2021 0633   AST 25 08/24/2014 1751   ALT 14 05/16/2021 0633   ALT 21 08/24/2014 1751   ALKPHOS 40 05/16/2021 0633   ALKPHOS 42 08/24/2014 1751   BILITOT 0.4 05/16/2021 0633   BILITOT 0.5 08/24/2014 1751   PROT 6.8 05/16/2021 0633   PROT 8.2 (H) 08/24/2014 1751   ALBUMIN 3.3 (L) 05/16/2021 0633   ALBUMIN 4.3 08/24/2014 1751    RADIOGRAPHIC STUDIES: No results found.  PERFORMANCE STATUS (ECOG) : 0 - Asymptomatic  Review of Systems Unless otherwise noted, a complete review of systems is negative.  Physical Exam General: NAD Pulmonary: Unlabored Extremities: no edema, no joint deformities Skin: no rashes Neurological: Grossly nonfocal  IMPRESSION: I met with patient today.  She reports that she is doing well.  She denies significant changes or concerns or symptomatic complaints.  We did discuss her coping/caregiver strain taking care of of her husband and now herself with a new cancer diagnosis.  We discussed the resources available at the cancer center.  Patient is interested in therapeutic counseling/resources.  Will refer to LCSW and psychology.  PLAN: -Continue current scope of treatment -Referrals to LCSW/psychology -RTC as needed  Case and plan discussed with Dr. Donneta Romberg   Patient expressed understanding and was in agreement with this plan. She also understands that She can call the clinic at any time with any questions, concerns, or complaints.     Time Total: 20 minutes  Visit consisted of counseling and education dealing with the complex and emotionally intense issues of symptom management and palliative care in the setting of serious and potentially life-threatening illness.Greater than 50%  of this time was spent counseling  and coordinating care related to the above assessment and plan.  Signed by: Laurette Schimke, PhD, NP-C

## 2021-07-04 NOTE — Therapy (Signed)
[facility] at [facility], [place], [place], [address] Phone: [phone]   Fax:  [phone]  Occupational Therapy Screen:  Patient Details  Name: Tracey Huff MRN: [id number] Date of Birth: 06-May-1971 No data recorded  Encounter Date: 07/04/2021   OT End of Session - 07/04/21 1437     Visit Number 0             Past Medical History:  Diagnosis Date   Allergy    Asthma    Breast cancer ([place])    INVASIVE MAMMARY CARCINOMA- left   Family history of prostate cancer    Fibroid uterus 11/15/2014   Headache    Hyperlipidemia    Hypertension    Menorrhagia with irregular cycle 11/15/2014   Pneumonia    Pre-diabetes    Shortness of breath dyspnea     Past Surgical History:  Procedure Laterality Date   ABDOMINAL HYSTERECTOMY N/A 11/15/2014   Procedure: HYSTERECTOMY ABDOMINAL/BILATERAL SALPINGECTOMY ;  Surgeon: Will Bonnet, MD;  Location: [facility] ORS;  Service: Gynecology;  Laterality: N/A;   BREAST BIOPSY Right    2019 negative   BREAST BIOPSY Left 03/09/2021   u/s biopsy, 12-1 o'clock, VENUS" clip-path pending   BREAST LUMPECTOMY,RADIO FREQ LOCALIZER,AXILLARY SENTINEL LYMPH NODE BIOPSY Left 03/28/2021   Procedure: BREAST LUMPECTOMY,RADIO FREQ LOCALIZER,AXILLARY SENTINEL LYMPH NODE BIOPSY;  Surgeon: Jules Husbands, MD;  Location: [facility] ORS;  Service: General;  Laterality: Left;   BREAST RECONSTRUCTION WITH PLACEMENT OF TISSUE EXPANDER AND FLEX HD (ACELLULAR HYDRATED DERMIS) Bilateral 05/15/2021   Procedure: BILATERAL BREAST RECONSTRUCTION WITH PLACEMENT OF TISSUE EXPANDER AND FLEX HD (ACELLULAR HYDRATED DERMIS);  Surgeon: Cindra Presume, MD;  Location: [facility] ORS;  Service: Plastics;  Laterality: Bilateral;   [place]   COLPOSCOPY  07/28/2018   CYSTOSCOPY N/A 11/15/2014   Procedure: CYSTOSCOPY;  Surgeon: Will Bonnet, MD;  Location: [facility] ORS;  Service: Gynecology;  Laterality: N/A;    DILATION AND CURETTAGE OF UTERUS     GANGLION CYST EXCISION Left    TOTAL MASTECTOMY Bilateral 05/15/2021   Procedure: TOTAL MASTECTOMY, Bilateral Simple Mastectomy, skin sparing;  Surgeon: Jules Husbands, MD;  Location: [facility] ORS;  Service: General;  Laterality: Bilateral;  Provider requesting for 2 hours / 120 min for procedure.   UTERINE FIBROID SURGERY      There were no vitals filed for this visit.   Subjective Assessment - 07/04/21 1435     Subjective  Doing okay has some pain under my R breast and when reaching over head - pull under or in my L axilla -back to work but I am not pulling or lifing anything- I am over the services that has tv monitor or cameras in rooms to monitor pt's that needs sitters    Currently in Pain? Yes    Pain Score 2     Pain Location Axilla    Pain Orientation Left    Pain Descriptors / Indicators Tightness    Pain Type Surgical pain    Pain Onset More than a month ago    Pain Frequency Intermittent                 LYMPHEDEMA/ONCOLOGY QUESTIONNAIRE - 07/04/21 0001       Right Upper Extremity Lymphedema   15 cm Proximal to Olecranon Process 39.5 cm    10 cm Proximal to Olecranon Process 35.8 cm    Olecranon Process 27 cm  15 cm Proximal to Ulnar Styloid Process 27.8 cm    10 cm Proximal to Ulnar Styloid Process 25 cm    Just Proximal to Ulnar Styloid Process 16.3 cm    Across Hand at [employer] 18.5 cm    At [place] of Thumb 6 cm      Left Upper Extremity Lymphedema   15 cm Proximal to Olecranon Process 40 cm    10 cm Proximal to Olecranon Process 36.4 cm    Olecranon Process 28 cm    15 cm Proximal to Ulnar Styloid Process 28.4 cm    10 cm Proximal to Ulnar Styloid Process 24.8 cm    Just Proximal to Ulnar Styloid Process 16.5 cm    Across Hand at [employer] 18 cm    At [place] of Thumb 5.6 cm             PLASTIC SURGERY NOTE 06/06/21- Patient is a 51 year old female with PMH of left-sided breast cancer s/p bilateral  breast reconstruction with tissue expanders and Flex HD performed 05/15/2021 by Dr. Claudia Desanctis.     She was last seen here in clinic 05/30/2021.  JP drains removed without complication or difficulty.  Saline placed in expanders for a total of 150/450 cc each side.   Today, patient is doing well.  She states that she has had difficulty sleeping recently.  She states that she is not typically a back sleeper.  She has been using a wedge and supporting pillows, but has simply not been able to remain comfortable in order to get enough good sleep.  She reports that she has not had to take any of the pain medications and otherwise is without any complaints.  She picked up a few bras from [employer] which she states have been much more comfortable.   Physical exam entirely reassuring.  Steri-Strips remain firmly intact.  She asked that we hold off on removal until next appointment.  No obvious subcutaneous fluid collections appreciated on exam.  JP drain insertion sites have already healed nicely.   We placed injectable saline in the Expander using a sterile technique: Right: 50 cc for a total of 200 / 450 cc Left: 50 cc for a total of 200 / 450 cc   Will prescribe a short course of hydroxyzine to see if that helps with her sleeping offered her a short course of hydroxyzine to see if that helps her stay asleep at night.  She was informed that this is a first-generation antihistamine and will make her drowsy.  She would like to proceed.   Follow-up in 2 weeks for follow-up and likely expander fill. Had fill 06/20/21 and next one tomorrow per pt   ASSESSMENT & PLAN DR Rogue Bussing 06/19/21:    Carcinoma of upper-outer quadrant of left breast in female, estrogen receptor positive ([place]) #Left breast cancer T68m0- G-2; ER/PR positive HER2 negative status postmastectomy [positive for CHEK-2 mutation].  Reviewed no role for radiation since patient is status postmastectomy.   #Oncotype recurrence score 16-which  translates to about 6% risk of recurrence with endocrine therapy only.  The benefit from chemotherapy is less than 1%.   #Again reviewed the role of adjuvant endocrine therapy. Discussed the role of anti-hormonal therapy mechanism of action- tamoxifen.  I would recommend tamoxifen for 5 years.  Long discussion regarding the potential adverse events on tamoxifen including but not limited to hot flashes, mood swings, thromboembolic events strokes and also small risk of uterine cancers.  I do not think patient had a spell of any serious side effects-given lack of other comorbidities/also s/p hysterectomy [intact ovaries]     # DISPOSITION:  # follow up 2 months- MD; no labs- Dr.B  OT SCREEN 07/04/21:  Pt ed on lymphedema signs and symptoms and precautions- hand out provided and review Pt do show some increase in upper arm compare to L UE - but could be that she carry always everything on the L per pt  She did show  decease AROM end range flexion , ABD and ext rotation on the L more than the R  Pt had some lymphatic cording in axilla- soft tissue mobs done and was able to get 2 releases with increase AROM end range and less discomfort  Pt to do AAROM over head in supine - using cane or wand for flexion and ABD  Also showed her wall slides AAROM for shoulders in shower Deep breathing reviewed for expanding ribs and diaphragm - pt ed on HEP while she is getting more fillings And follow up with me in 2 wks - monitor ROM , cording and lymphedema  Pt in agreement                             Patient will benefit from skilled therapeutic intervention in order to improve the following deficits and impairments:           Visit Diagnosis: Scar condition and fibrosis of skin  Stiffness of left shoulder, not elsewhere classified    Problem List Patient Active Problem List   Diagnosis Date Noted   Breast cancer ([place]) 05/15/2021   Genetic testing 05/10/2021   Family history  of prostate cancer 04/17/2021   Allergic rhinitis 03/21/2021   Allergic rhinitis due to animal (cat) (dog) hair and dander 03/21/2021   Allergic rhinitis due to pollen 03/21/2021   Chronic allergic conjunctivitis 03/21/2021   Mild persistent asthma, uncomplicated 16/02/9603   Carcinoma of upper-outer quadrant of left breast in female, estrogen receptor positive ([place]) 03/21/2021   High grade squamous intraepithelial cervical dysplasia 01/29/2019   CIN III (cervical intraepithelial neoplasia grade III) with severe dysplasia 08/11/2018   Atypical squamous cell changes of undetermined significance (ASCUS) on cervical cytology with positive high risk human papilloma virus (HPV) 06/29/2018   Prediabetes 06/24/2018   Class 1 obesity due to excess calories with serious comorbidity and body mass index (BMI) of 33.0 to 33.9 in adult 05/20/2018   Essential hypertension 05/20/2018   Hyperlipidemia 05/20/2018   Asthma 05/20/2018   Anxiety in acute stress reaction 05/20/2018   Status post hysterectomy 11/15/2014    Rosalyn Gess, OTR/L,CLT 07/04/2021, 2:43 PM  [place] [facility] at [facility] [address], [place], [place], [address] Phone: [phone]   Fax:  [phone]  Name: SAMAYRA HEBEL MRN: [id number] Date of Birth: 12/21/1970

## 2021-07-05 ENCOUNTER — Other Ambulatory Visit: Payer: Self-pay

## 2021-07-05 ENCOUNTER — Ambulatory Visit: Payer: 59 | Admitting: Physician Assistant

## 2021-07-05 DIAGNOSIS — Z9889 Other specified postprocedural states: Secondary | ICD-10-CM

## 2021-07-05 DIAGNOSIS — J301 Allergic rhinitis due to pollen: Secondary | ICD-10-CM | POA: Diagnosis not present

## 2021-07-05 DIAGNOSIS — J3081 Allergic rhinitis due to animal (cat) (dog) hair and dander: Secondary | ICD-10-CM | POA: Diagnosis not present

## 2021-07-05 DIAGNOSIS — J3089 Other allergic rhinitis: Secondary | ICD-10-CM | POA: Diagnosis not present

## 2021-07-09 ENCOUNTER — Encounter: Payer: Self-pay | Admitting: Licensed Clinical Social Worker

## 2021-07-09 ENCOUNTER — Ambulatory Visit (INDEPENDENT_AMBULATORY_CARE_PROVIDER_SITE_OTHER): Payer: 59 | Admitting: Psychologist

## 2021-07-09 DIAGNOSIS — F4322 Adjustment disorder with anxiety: Secondary | ICD-10-CM | POA: Diagnosis not present
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
## 2021-07-09 NOTE — Plan of Care (Signed)

## 2021-07-09 NOTE — Progress Notes (Signed)
                Burlon Centrella, PsyD 

## 2021-07-09 NOTE — Progress Notes (Signed)
[facility] Clinical Social Work  ?Initial Assessment ? ? ?Tracey Huff is a 51 y.o. year old female contacted by phone. Clinical Social Work was referred by medical provider for assessment of psychosocial needs.  ? ?SDOH (Social Determinants of Health) assessments performed: Yes ?SDOH Interventions   ? ?Flowsheet Row Most Recent Value  ?SDOH Interventions   ?Financial Strain Interventions Other (Comment), Financial Counselor  [[employer] referral]  ?Housing Interventions Intervention Not Indicated  ?Physical Activity Interventions Intervention Not Indicated  ?Stress Interventions Provide Counseling  ?Social Connections Interventions Intervention Not Indicated  ?Transportation Interventions Intervention Not Indicated  ? ?  ?  ?Distress Screen completed: No ?ONCBCN DISTRESS SCREENING 12/30/2018  ?Screening Type Initial Screening  ?Distress experienced in past week (1-10) 0  ? ? ? ? ?Family/Social Information:  ?Housing Arrangement: patient lives with family  . ?Family members/support persons in your life? Family, Medical Staff, Friends/Colleagues, and Church ?Transportation concerns: no  ?Employment: Working full time. Income source: Employment ?Financial concerns: Yes, current concerns ?Type of concern: Medical bills ?Food access concerns: no ?Religious or spiritual practice: yes ?Services Currently in place:  [facility] ? ?Coping/ Adjustment to diagnosis: ?Patient understands treatment plan and what happens next? yes ?Concerns about diagnosis and/or treatment: Pain or discomfort during procedures, Feelings of anger or sadness, Body image (disfigurement), Losing my job, Software engineer by information, Afraid of cancer, and Relationship with husband or partner ?Patient reported stressors: Work/ school, Finances, Depression, Anxiety, Adjusting to my illness, Isolation/ feeling alone, Feeling hopeless, Relating to God, and Facing my mortality ?Hopes and priorities: To continue working while in treatment ?Patient enjoys music,  watching TV, and time with family/ friends ?Current coping skills/ strengths: Ability for insight , Average or above average intelligence , Capable of independent living , Communication skills , Scientist, research (life sciences) , Motivation for treatment/growth , Religious Affiliation , Supportive family/friends , and Other: Counseling w/ psychologist ? ? ? SUMMARY: ?Current SDOH Barriers:  ?Mental Health Concerns  ? ?Clinical Social Work Clinical Goal(s):  ?patient will follow up with employer HR department to discuss ADA accommodations, FMLA and short-term disability* as directed by SW ? ?Interventions: ?Discussed common feeling and emotions when being diagnosed with cancer, and the importance of support during treatment ?Informed patient of the support team roles and support services at [facility] ?Provided CSW contact information and encouraged patient to call with any questions or concerns ?Referred patient to [facility] and Provided patient with information about CSW role in patient care and available resources. ? ? ?Follow Up Plan: Patient will contact CSW with any support or resource needs ?Patient verbalizes understanding of plan: Yes ? ? ? ?Tracey Anselmi, LCSW ?

## 2021-07-17 NOTE — Progress Notes (Addendum)
Patient is a pleasant 51 year old female with PMH of left-sided breast cancer s/p bilateral breast reconstruction with tissue expanders and Flex HD performed 05/15/2021 by Dr. Claudia Desanctis.  Patient was a 74 B/C cup prior to her mastectomy and reconstruction. ?  ?Patient was last seen here in clinic on 07/05/2021.  At that time, exam was reassuring.  Injectable saline was placed in expanders bilaterally for a total of 370/450 cc each side.  Plan is for her to return in 2 weeks for additional expander fill. ? ?Today, patient is doing well.  Denies any pain, tightness, swelling, redness, drainage, or other complaints.  She does however report that her breasts still do not fill out her previous bras.  She is hoping that she could be larger.  Feels prepared for additional expander fill. ? ?Physical exam is entirely reassuring.  Expanders appropriately located.  Good symmetry.  No wounds, redness, or subcutaneous fluid collections. ? ?We placed injectable saline in the Expander using a sterile technique: ?Right: 70 cc for a total of 440 / 450 cc ?Left: 70 cc for a total of 440 / 450 cc ? ?We will have her return in 10 days for likely final expander fill.   ? ?

## 2021-07-18 ENCOUNTER — Inpatient Hospital Stay: Payer: 59 | Admitting: Occupational Therapy

## 2021-07-18 ENCOUNTER — Other Ambulatory Visit: Payer: Self-pay

## 2021-07-18 ENCOUNTER — Other Ambulatory Visit (HOSPITAL_COMMUNITY): Payer: Self-pay

## 2021-07-18 DIAGNOSIS — L905 Scar conditions and fibrosis of skin: Secondary | ICD-10-CM

## 2021-07-18 NOTE — Therapy (Signed)
[place] ?[facility] ?[address], [address] ?[place], [place], [address] ?Phone: [phone]   Fax:  [phone] ? ?Occupational Therapy SCREEN: ? ?Patient Details  ?Name: Tracey Huff ?MRN: [id number] ?Date of Birth: Mar 09, 1971 ?No data recorded ? ?Encounter Date: 07/18/2021 ? ? OT End of Session - 07/18/21 1852   ? ? Visit Number 0   ? ?  ?  ? ?  ? ? ?Past Medical History:  ?Diagnosis Date  ? Allergy   ? Asthma   ? Breast cancer ([place])   ? INVASIVE MAMMARY CARCINOMA- left  ? Family history of prostate cancer   ? Fibroid uterus 11/15/2014  ? Headache   ? Hyperlipidemia   ? Hypertension   ? Menorrhagia with irregular cycle 11/15/2014  ? Pneumonia   ? Pre-diabetes   ? Shortness of breath dyspnea   ? ? ?Past Surgical History:  ?Procedure Laterality Date  ? ABDOMINAL HYSTERECTOMY N/A 11/15/2014  ? Procedure: HYSTERECTOMY ABDOMINAL/BILATERAL SALPINGECTOMY ;  Surgeon: Will Bonnet, MD;  Location: [facility] OR[facility];  Service: Gynecology;  Laterality: N/A;  ? BREAST BIOPSY Right   ? 2019 negative  ? BREAST BIOPSY Left 03/09/2021  ? u/s biopsy, 12-1 o'clock, VENUS" clip-path pending  ? BREAST LUMPECTOMY,RADIO FREQ LOCALIZER,AXILLARY SENTINEL LYMPH NODE BIOPSY Left 03/28/2021  ? Procedure: BREAST LUMPECTOMY,RADIO FREQ LOCALIZER,AXILLARY SENTINEL LYMPH NODE BIOPSY;  Surgeon: Jules Husbands, MD;  Location: [facility];  Service: General;  Laterality: Left;  ? BREAST RECONSTRUCTION WITH PLACEMENT OF TISSUE EXPANDER AND FLEX HD (ACELLULAR HYDRATED DERMIS) Bilateral 05/15/2021  ? Procedure: BILATERAL BREAST RECONSTRUCTION WITH PLACEMENT OF TISSUE EXPANDER AND FLEX HD (ACELLULAR HYDRATED DERMIS);  Surgeon: Cindra Presume, MD;  Location: [facility];  Service: Plastics;  Laterality: Bilateral;  ? [place]  ? COLPOSCOPY  07/28/2018  ? CYSTOSCOPY N/A 11/15/2014  ? Procedure: CYSTOSCOPY;  Surgeon: Will Bonnet, MD;  Location: [facility];  Service: Gynecology;  Laterality: N/A;  ?  DILATION AND CURETTAGE OF UTERUS    ? GANGLION CYST EXCISION Left   ? TOTAL MASTECTOMY Bilateral 05/15/2021  ? Procedure: TOTAL MASTECTOMY, Bilateral Simple Mastectomy, skin sparing;  Surgeon: Jules Husbands, MD;  Location: [facility];  Service: General;  Laterality: Bilateral;  Provider requesting for 2 hours / 120 min for procedure.  ? UTERINE FIBROID SURGERY    ? ? ?There were no vitals filed for this visit. ? ? Subjective Assessment - 07/18/21 1851   ? ? Subjective  I am doing well I had to [place] since have seen you in breast-following up for another filling tomorrow, and 1 more after that-do not have date for implants-I do feel like pull still in my armpit with overhead range of motion   ? Currently in Pain? No/denies   ? ?  ?  ? ?  ? ? ? ? ? ? LYMPHEDEMA/ONCOLOGY QUESTIONNAIRE - 07/18/21 0001   ? ?  ? Right Upper Extremity Lymphedema  ? 15 cm Proximal to Olecranon Process 38.8 cm   ? 10 cm Proximal to Olecranon Process 35.8 cm   ? Olecranon Process 27.5 cm   ?  ? Left Upper Extremity Lymphedema  ? 15 cm Proximal to Olecranon Process 39 cm   ? 10 cm Proximal to Olecranon Process 36 cm   ? Olecranon Process 28 cm   ? ?  ?  ? ?  ? ? ? ?OT SCREEN 07/04/21:  ?Pt ed on lymphedema signs and symptoms and  precautions- hand out provided and review ?Pt do show some increase in upper arm compare to L UE - but could be that she carry always everything on the L per pt  ?She did show  decease AROM end range flexion , ABD and ext rotation on the L more than the R  ?Pt had some lymphatic cording in axilla- soft tissue mobs done and was able to get 2 releases with increase AROM end range and less discomfort  ?Pt to do AAROM over head in supine - using cane or wand for flexion and ABD  ?Also showed her wall slides AAROM for shoulders in shower ?Deep breathing reviewed for expanding ribs and diaphragm - pt ed on HEP while she is getting more fillings ?And follow up with me in 2 wks - monitor ROM , cording and lymphedema ? Pt in  agreement  ? ? ?07/05/21 Plastic surgery follow up: ?Patient is a pleasant 51 year old female with PMH of left-sided breast cancer s/p bilateral breast reconstruction with tissue expanders and Flex HD performed 05/15/2021 by Dr. Claudia Desanctis.   ?  ?Patient was last seen here in the office on 06/20/2021.  At that time, exam was reassuring and patient was doing well.  100 cc was injected into each expander for a total of 300/450 cc each side.  Plan was for her to return in a couple of weeks for repeat expander fill. ?  ?Today, patient is doing well.  She has started occupational therapy and will be going once every 2 weeks.  She still will occasionally take NSAIDs and Tylenol as needed for discomfort, particular right after an expander fill.  Denies any redness, fevers, or unusual skin changes. ? ?She states that she was a 51 B cup prior to her mastectomy with immediate reconstruction.  She will try to continue gauging what expander size is appropriate prior to her implant exchange.  Would like to proceed with additional fill here today. ?  ?Physical exam is entirely reassuring.  No subcutaneous fluid collections or cellulitic changes. ?  ?We placed injectable saline in the Expander using a sterile technique: ?Right: 70 cc for a total of 370 / 450 cc ?Left: 70 cc for a total of 370 / 450 cc ?  ?Patient to return in 2 weeks for repeat expander fill and at that time determine whether or not she would like to proceed with implant change. ? ? ? ? ? ? OT SCREEN 07/18/21 : ?Patient following up with OT after being seen 2 weeks ago ?She had 2 more feelings of expander since then ?Last time patient has some cording in axilla, had some release of cording and increased range of motion ?Patient reports she has been doing AAROM of bilateral shoulder flexion and abduction on wall with great results ?Patient do report some pulling in the axilla on the left with overhead shoulder flexion abduction ?Bilateral circumference of upper extremity  decreased-left still increased compared to the right we will keep on monitoring ?Did some soft tissue massage manually by OT today in axilla and shoulder flexion and abduction end range ?Had some release of cording with great results ?Patient will have 2 more fillings possibly ?Patient to contact me or follow-up if needed prior to implants ?Patient to continue with home exercises ? ? ? ? ? ? ? ? ? ? ? ? ? ? ? ? ? ? ? ? ? ? ? ? ?  ?  ?  ? ? ?Visit Diagnosis: ?Scar condition and  fibrosis of skin ? ? ? ?Problem List ?Patient Active Problem List  ? Diagnosis Date Noted  ? Breast cancer ([place]) 05/15/2021  ? Genetic testing 05/10/2021  ? Family history of prostate cancer 04/17/2021  ? Allergic rhinitis 03/21/2021  ? Allergic rhinitis due to animal (cat) (dog) hair and dander 03/21/2021  ? Allergic rhinitis due to pollen 03/21/2021  ? Chronic allergic conjunctivitis 03/21/2021  ? Mild persistent asthma, uncomplicated 25/85/2778  ? Carcinoma of upper-outer quadrant of left breast in female, estrogen receptor positive ([place]) 03/21/2021  ? High grade squamous intraepithelial cervical dysplasia 01/29/2019  ? CIN III (cervical intraepithelial neoplasia grade III) with severe dysplasia 08/11/2018  ? Atypical squamous cell changes of undetermined significance (ASCUS) on cervical cytology with positive high risk human papilloma virus (HPV) 06/29/2018  ? Prediabetes 06/24/2018  ? Class 1 obesity due to excess calories with serious comorbidity and body mass index (BMI) of 33.0 to 33.9 in adult 05/20/2018  ? Essential hypertension 05/20/2018  ? Hyperlipidemia 05/20/2018  ? Asthma 05/20/2018  ? Anxiety in acute stress reaction 05/20/2018  ? Status post hysterectomy 11/15/2014  ? ? ?Rosalyn Gess, OTR/L,CLT ?07/18/2021, 6:52 PM ? ?[place] ?[facility] ?[address], [address] ?[place], [place], [address] ?Phone: [phone]   Fax:  [phone] ? ?Name: TAMELLA TUCCILLO ?MRN: [id number] ?Date of  Birth: 07/12/1970 ? ?

## 2021-07-19 ENCOUNTER — Other Ambulatory Visit: Payer: Self-pay

## 2021-07-19 ENCOUNTER — Ambulatory Visit (INDEPENDENT_AMBULATORY_CARE_PROVIDER_SITE_OTHER): Payer: 59 | Admitting: Physician Assistant

## 2021-07-19 DIAGNOSIS — J3089 Other allergic rhinitis: Secondary | ICD-10-CM | POA: Diagnosis not present

## 2021-07-19 DIAGNOSIS — J3081 Allergic rhinitis due to animal (cat) (dog) hair and dander: Secondary | ICD-10-CM | POA: Diagnosis not present

## 2021-07-19 DIAGNOSIS — Z9889 Other specified postprocedural states: Secondary | ICD-10-CM

## 2021-07-19 DIAGNOSIS — J301 Allergic rhinitis due to pollen: Secondary | ICD-10-CM | POA: Diagnosis not present

## 2021-07-21 ENCOUNTER — Other Ambulatory Visit: Payer: Self-pay | Admitting: Physician Assistant

## 2021-07-27 DIAGNOSIS — J301 Allergic rhinitis due to pollen: Secondary | ICD-10-CM | POA: Diagnosis not present

## 2021-07-28 DIAGNOSIS — J3081 Allergic rhinitis due to animal (cat) (dog) hair and dander: Secondary | ICD-10-CM | POA: Diagnosis not present

## 2021-07-28 DIAGNOSIS — J3089 Other allergic rhinitis: Secondary | ICD-10-CM | POA: Diagnosis not present

## 2021-07-31 NOTE — Progress Notes (Signed)
Patient is a pleasant 51 year old female with PMH of left-sided breast cancer s/p bilateral breast reconstruction with tissue expanders and Flex HD performed 05/15/2021 by Dr. Claudia Desanctis.  Patient was a 20 B/C cup prior to her mastectomy and reconstruction. ? ?Patient was last seen here in the office on 07/19/2021.  At that time, exam was entirely reassuring.  70 cc was placed in the each expander for a total of 440/450 cc.  Plan was for her to return in 10 days for likely final expander fill. ? ?Today, patient is doing well.  She tells me that she was measured after her most recent expander fill and was told that she is approximately a C cup.  After hearing that, she tells me that she would like to be bigger.  She did not have any significant discomfort after left expander fills. ? ?On exam, the skin on her left breast does feel a bit more taut than the right side.  There is also more superior pole fullness compared to the right side which appears to be mildly lower on the chest.  No redness or subcutaneous fluid collections noted.  Healed well. ? ?She feels prepared for additional expander fill. We placed injectable saline in the Expander using a sterile technique: ?Right: 50 cc for a total of 490 / 450 cc ?Left: 50 cc for a total of 490 / 450 cc ? ?After conclusion of expander fill, she does state that the left side is starting to feel a bit more tight.  Do not know if she will be able to tolerate additional fills.  We will have her return in 2 weeks for additional evaluation and likely final fill, if at all.  Discussed possibly using silicone tape over her mastectomy site incisions between now and her second phase of reconstruction. ? ?Picture(s) obtained of the patient and placed in the chart were with the patient's or guardian's permission. ? ?

## 2021-08-01 ENCOUNTER — Other Ambulatory Visit: Payer: Self-pay

## 2021-08-01 ENCOUNTER — Encounter: Payer: Self-pay | Admitting: Physician Assistant

## 2021-08-01 ENCOUNTER — Ambulatory Visit: Payer: 59 | Admitting: Physician Assistant

## 2021-08-01 DIAGNOSIS — Z9889 Other specified postprocedural states: Secondary | ICD-10-CM

## 2021-08-03 DIAGNOSIS — J3089 Other allergic rhinitis: Secondary | ICD-10-CM | POA: Diagnosis not present

## 2021-08-03 DIAGNOSIS — J3081 Allergic rhinitis due to animal (cat) (dog) hair and dander: Secondary | ICD-10-CM | POA: Diagnosis not present

## 2021-08-03 DIAGNOSIS — J301 Allergic rhinitis due to pollen: Secondary | ICD-10-CM | POA: Diagnosis not present

## 2021-08-07 ENCOUNTER — Ambulatory Visit: Payer: 59 | Admitting: Psychologist

## 2021-08-07 ENCOUNTER — Ambulatory Visit (INDEPENDENT_AMBULATORY_CARE_PROVIDER_SITE_OTHER): Payer: 59 | Admitting: Psychologist

## 2021-08-07 DIAGNOSIS — F4322 Adjustment disorder with anxiety: Secondary | ICD-10-CM

## 2021-08-07 NOTE — Progress Notes (Signed)
[facility] Counselor/Therapist Progress Note ? ?Patient ID: Tracey Huff, MRN: [id number],   ? ?Date: 08/07/2021 ? ?Time Spent: 3:05 pm to 3:45 pm; total time: 40 minutes ? ? This session was held via in person. The patient consented to in-person therapy and was in the clinician's office. Limits of confidentiality were discussed with the patient.  ? ?Treatment Type: Individual Therapy ? ?Reported Symptoms: Anxiety and stressors ? ?Mental Status Exam: ?Appearance:  Well Groomed     ?Behavior: Appropriate  ?Motor: Normal  ?Speech/Language:  Clear and Coherent  ?Affect: Appropriate  ?Mood: normal  ?Thought process: normal  ?Thought content:   WNL  ?Sensory/Perceptual disturbances:   WNL  ?Orientation: oriented to person, place, and time/date  ?Attention: Good  ?Concentration: Good  ?Memory: WNL  ?Fund of knowledge:  Good  ?Insight:   Fair  ?Judgment:  Good  ?Impulse Control: Good  ? ?Risk Assessment: ?Danger to Self:  No ?Self-injurious Behavior: No ?Danger to Others: No ?Duty to Warn:no ?Physical Aggression / Violence:No  ?Access to Firearms a concern: No  ?Gang Involvement:No  ? ?Subjective: Beginning the session, patient described herself as okay. After reviewing the treatment plan, patient spent time reflecting on her cancer story. In reflecting on her cancer story, she acknowledged that she has not disclosed to her husband that she had surgery for cancer. Per the patient, it is difficult to tell him due to the fact that he is experiencing Glioblastoma. Patient spent the session reflecting on the last three years of her life in which she lost her mother, her husband was diagnosed with cancer, and she was diagnosed with cancer. Through the conversation she acknowledged it is hard to trust others and be vulnerable because she has been hurt before. She processed thoughts and emotions. She was agreeable to homework and following up. She denied suicidal and homicidal ideation.   ? ?Interventions:   Worked on developing a therapeutic relationship using active listening and reflective statements. Provided emotional support using empathy and validation. Reviewed the treatment plan with the patient. Reviewed events since the intake. Normalized and validated expressed thoughts and emotions. Assisted the patient in doing a partial life review of the last three years.  Used socratic questions to assist the patient gain insight into self. Explored barriers to disclosing information to spouse. Normalized and validated expressed concerns. Processed different ways for the patient to be able to express the emotions she is experiencing. Facilitated the patient gaining insight into what emotions she has a difficult time processing. Worked on building a safe environment for the patient. Used a Product/process development scientist to assist the patient. Reflected on the idea of writing letter to the different emotions she experiences. Processed and explored the idea of vulnerability and how she can learn to be vulnerable. Normalized the expressed thoughts. Processed how the therapeutic relationship can assist the patient. Assigned homework. Assessed for suicidal and homicidal ideation.  ? ?Homework: Write letter to emotions she is experiencing including grief over loss of future ? ?Next Session: Review homework and emotional support.  ? ?Diagnosis: F43.22 adjustment disorder with anxiety  ? ?Plan:  ? ?Goals ?Work through the grieving process and face reality of own death ?Accept emotional support from others around them ?Live life to the fullest, event though time may be limited ?Become as knowledgeable about the medical condition  ?Reduce fear, anxiety about the health condition  ?Accept the illness ?Accept the role of psychological and behavioral factors  ?Stabilize anxiety level wile increasing ability to function ?  Learn and implement coping skills that result in a reduction of anxiety  ?Alleviate depressive symptoms ?Recognize, accept, and cope with  depressive feelings ?Develop healthy thinking patterns ?Develop healthy interpersonal relationships ? ?Objectives target date for all objectives is 07/10/2022 ?Identify feelings associated with the illness ?Family members share with each other feelings ?Identify the losses or limitations that have been experienced ?Verbalize acceptance of the reality of the medical condition ?Commit to learning and implement a proactive approach to managing personal stresses ?Verbalize an understanding of the medical condition ?Work with therapist to develop a plan for coping with stress ?Learn and implement skills for managing stress ?Engage in social, productive activities that are possible ?Engage in faith based activities ?implement positive imagery ?Identify coping skills and sources of emotional support ?Patient's partner and family members verbalize their fears regarding severity of health condition ?Identify sources of emotional distress  ?Learning and implement calming skills to reduce overall anxiety ?Learn and implement problem solving strategies ?Identify and engage in pleasant activities ?Learning and implement personal and interpersonal skills to reduce anxiety and improve interpersonal relationships ?Learn to accept limitations in life and commit to tolerating, rather than avoiding, unpleasant emotions while accomplishing meaningful goals ?Identify major life conflicts from the past and present that form the basis for present anxiety ?Learn and implement behavioral strategies ?Verbalize an understanding and resolution of current interpersonal problems ?Learn and implement problem solving and decision making skills ?Learn and implement conflict resolution skills to resolve interpersonal problems ?Verbalize an understanding of healthy and unhealthy emotions verbalize insight into how past relationships may be influence current experiences with depression ?Use mindfulness and acceptance strategies and increase value based  behavior  ?Increase hopeful statements about the future.  ? ?Interventions ?Teach about stress and ways to handle stress ?Assist the patient in developing a coping action plan for stressors ?Conduct skills based training for coping strategies ?Train problem focused skills ?Sort out what activities the individual can do ?Encourage patient to rely upon his/her spiritual faith ?Teach the patient to use guided imagery ?Probe and evaluate family's ability to provide emotional support ?Allow family to share their fears ?Assist the patient in identifying, sorting through, and verbalizing the various feelings generated by his/her medical condition ?Meet with family members  ?Ask patient list out limitations  ?Use stress inoculation training  ?Use Acceptance and Commitment Therapy to help client accept uncomfortable realities in order to accomplish value-consistent goals ?Reinforce the client's insight into the role of his/her past emotional pain and present anxiety  ?Discuss examples demonstrating that unrealistic worry overestimates the probability of threats and underestimate patient's ability  ?Assist the patient in analyzing his or her worries ?Help patient understand that avoidance is reinforcing  ?Behavioral activation help the client explore the relationship, nature of the dispute,  ?Help the client develop new interpersonal skills and relationships ?Conduct Problem so living therapy ?Teach conflict resolution skills ?Use a process-experiential approach ?Conduct TLDP ?Conduct ACT ? ?The patient and clinician reviewed the treatment plan on 08/07/2021. The patient approved of the treatment plan.  ? ?Conception Chancy, PsyD ? ? ? ?

## 2021-08-07 NOTE — Progress Notes (Signed)
                Tracey Giacobbe, PsyD 

## 2021-08-08 ENCOUNTER — Inpatient Hospital Stay: Payer: 59 | Attending: Internal Medicine | Admitting: Occupational Therapy

## 2021-08-08 DIAGNOSIS — Z170 Estrogen receptor positive status [ER+]: Secondary | ICD-10-CM | POA: Insufficient documentation

## 2021-08-08 DIAGNOSIS — Z9013 Acquired absence of bilateral breasts and nipples: Secondary | ICD-10-CM | POA: Insufficient documentation

## 2021-08-08 DIAGNOSIS — R232 Flushing: Secondary | ICD-10-CM | POA: Insufficient documentation

## 2021-08-08 DIAGNOSIS — Z79899 Other long term (current) drug therapy: Secondary | ICD-10-CM | POA: Insufficient documentation

## 2021-08-08 DIAGNOSIS — C50412 Malignant neoplasm of upper-outer quadrant of left female breast: Secondary | ICD-10-CM | POA: Insufficient documentation

## 2021-08-08 DIAGNOSIS — M25612 Stiffness of left shoulder, not elsewhere classified: Secondary | ICD-10-CM

## 2021-08-08 DIAGNOSIS — Z79810 Long term (current) use of selective estrogen receptor modulators (SERMs): Secondary | ICD-10-CM | POA: Insufficient documentation

## 2021-08-08 DIAGNOSIS — L905 Scar conditions and fibrosis of skin: Secondary | ICD-10-CM

## 2021-08-08 NOTE — Therapy (Signed)
[place] ?[facility] ?[address], [address] ?[place], [place], [address] ?Phone: [phone]   Fax:  [phone] ? ?Occupational Therapy Screen: ? ?Patient Details  ?Name: Tracey Huff ?MRN: [id number] ?Date of Birth: 26-Apr-1971 ?No data recorded ? ?Encounter Date: 08/08/2021 ? ? OT End of Session - 08/08/21 1102   ? ? Visit Number 0   ? ?  ?  ? ?  ? ? ?Past Medical History:  ?Diagnosis Date  ? Allergy   ? Asthma   ? Breast cancer ([place])   ? INVASIVE MAMMARY CARCINOMA- left  ? Family history of prostate cancer   ? Fibroid uterus 11/15/2014  ? Headache   ? Hyperlipidemia   ? Hypertension   ? Menorrhagia with irregular cycle 11/15/2014  ? Pneumonia   ? Pre-diabetes   ? Shortness of breath dyspnea   ? ? ?Past Surgical History:  ?Procedure Laterality Date  ? ABDOMINAL HYSTERECTOMY N/A 11/15/2014  ? Procedure: HYSTERECTOMY ABDOMINAL/BILATERAL SALPINGECTOMY ;  Surgeon: Will Bonnet, MD;  Location: [facility];  Service: Gynecology;  Laterality: N/A;  ? BREAST BIOPSY Right   ? 2019 negative  ? BREAST BIOPSY Left 03/09/2021  ? u/s biopsy, 12-1 o'clock, VENUS" clip-path pending  ? BREAST LUMPECTOMY,RADIO FREQ LOCALIZER,AXILLARY SENTINEL LYMPH NODE BIOPSY Left 03/28/2021  ? Procedure: BREAST LUMPECTOMY,RADIO FREQ LOCALIZER,AXILLARY SENTINEL LYMPH NODE BIOPSY;  Surgeon: Jules Husbands, MD;  Location: [facility];  Service: General;  Laterality: Left;  ? BREAST RECONSTRUCTION WITH PLACEMENT OF TISSUE EXPANDER AND FLEX HD (ACELLULAR HYDRATED DERMIS) Bilateral 05/15/2021  ? Procedure: BILATERAL BREAST RECONSTRUCTION WITH PLACEMENT OF TISSUE EXPANDER AND FLEX HD (ACELLULAR HYDRATED DERMIS);  Surgeon: Cindra Presume, MD;  Location: [facility];  Service: Plastics;  Laterality: Bilateral;  ? [place]  ? COLPOSCOPY  07/28/2018  ? CYSTOSCOPY N/A 11/15/2014  ? Procedure: CYSTOSCOPY;  Surgeon: Will Bonnet, MD;  Location: [facility];  Service: Gynecology;  Laterality: N/A;  ?  DILATION AND CURETTAGE OF UTERUS    ? GANGLION CYST EXCISION Left   ? TOTAL MASTECTOMY Bilateral 05/15/2021  ? Procedure: TOTAL MASTECTOMY, Bilateral Simple Mastectomy, skin sparing;  Surgeon: Jules Husbands, MD;  Location: [facility];  Service: General;  Laterality: Bilateral;  Provider requesting for 2 hours / 120 min for procedure.  ? UTERINE FIBROID SURGERY    ? ? ?There were no vitals filed for this visit. ? ? Subjective Assessment - 08/08/21 1126   ? ? Subjective  I had to [place] again and I was telling him I want to go to a size D bra.  But I think I want to maybe stay with [place] either way can use my pretty p.o. from before and the right one is a little but not as high as the left.  But the surgeon was a can correct that when I get my implants.  I motion is great still and I did not notice any swelling in my arm.   ? ?  ?  ? ?  ? ? ? ? ? ? LYMPHEDEMA/ONCOLOGY QUESTIONNAIRE - 08/08/21 0001   ? ?  ? Right Upper Extremity Lymphedema  ? 15 cm Proximal to Olecranon Process 40 cm   ? 10 cm Proximal to Olecranon Process 35.4 cm   ? Olecranon Process 27.5 cm   ? 15 cm Proximal to Ulnar Styloid Process 27.4 cm   ? 10 cm Proximal to Ulnar Styloid Process 24.4 cm   ?  ?  Left Upper Extremity Lymphedema  ? 15 cm Proximal to Olecranon Process 40 cm   ? 10 cm Proximal to Olecranon Process 36 cm   ? Olecranon Process 28 cm   ? 15 cm Proximal to Ulnar Styloid Process 27.8 cm   ? 10 cm Proximal to Ulnar Styloid Process 24.5 cm   ? ?  ?  ? ?  ? ? ? ?OT SCREEN 07/04/21:  ?Pt ed on lymphedema signs and symptoms and precautions- hand out provided and review ?Pt do show some increase in upper arm compare to L UE - but could be that she carry always everything on the L per pt  ?She did show  decease AROM end range flexion , ABD and ext rotation on the L more than the R  ?Pt had some lymphatic cording in axilla- soft tissue mobs done and was able to get 2 releases with increase AROM end range and less discomfort  ?Pt to do AAROM over head  in supine - using cane or wand for flexion and ABD  ?Also showed her wall slides AAROM for shoulders in shower ?Deep breathing reviewed for expanding ribs and diaphragm - pt ed on HEP while she is getting more fillings ?And follow up with me in 2 wks - monitor ROM , cording and lymphedema ? Pt in agreement  ?  ?  ?07/05/21 Plastic surgery follow up: ?Patient is a pleasant 51 year old female with PMH of left-sided breast cancer s/p bilateral breast reconstruction with tissue expanders and Flex HD performed 05/15/2021 by Dr. Claudia Desanctis.   ?  ?Patient was last seen here in the office on 06/20/2021.  At that time, exam was reassuring and patient was doing well.  100 cc was injected into each expander for a total of 300/450 cc each side.  Plan was for her to return in a couple of weeks for repeat expander fill. ?  ?Today, patient is doing well.  She has started occupational therapy and will be going once every 2 weeks.  She still will occasionally take NSAIDs and Tylenol as needed for discomfort, particular right after an expander fill.  Denies any redness, fevers, or unusual skin changes. ? ?She states that she was a 36 B cup prior to her mastectomy with immediate reconstruction.  She will try to continue gauging what expander size is appropriate prior to her implant exchange.  Would like to proceed with additional fill here today. ?  ?Physical exam is entirely reassuring.  No subcutaneous fluid collections or cellulitic changes. ?  ?We placed injectable saline in the Expander using a sterile technique: ?Right: 70 cc for a total of 370 / 450 cc ?Left: 70 cc for a total of 370 / 450 cc ?  ?Patient to return in 2 weeks for repeat expander fill and at that time determine whether or not she would like to proceed with implant change. ?  ?  ?  ?  ?  ? OT SCREEN 07/18/21 : ?Patient following up with OT after being seen 2 weeks ago ?She had 2 more feelings of expander since then ?Last time patient has some cording in axilla, had some  release of cording and increased range of motion ?Patient reports she has been doing AAROM of bilateral shoulder flexion and abduction on wall with great results ?Patient do report some pulling in the axilla on the left with overhead shoulder flexion abduction ?Bilateral circumference of upper extremity decreased-left still increased compared to the right we will keep on  monitoring ?Did some soft tissue massage manually by OT today in axilla and shoulder flexion and abduction end range ?Had some release of cording with great results ?Patient will have 2 more fillings possibly ?Patient to contact me or follow-up if needed prior to implants ?Patient to continue with home exercises ?  ?  ? 08/01/21 SURGEON NOTE:  ? Patient is a pleasant 51 year old female with PMH of left-sided breast cancer s/p bilateral breast reconstruction with tissue expanders and Flex HD performed 05/15/2021 by Dr. Claudia Desanctis.  Patient was a 57 B/C cup prior to her mastectomy and reconstruction. ?  ?Patient was last seen here in the office on 07/19/2021.  At that time, exam was entirely reassuring.  70 cc was placed in the each expander for a total of 440/450 cc.  Plan was for her to return in 10 days for likely final expander fill. ?  ?Today, patient is doing well.  She tells me that she was measured after her most recent expander fill and was told that she is approximately a C cup.  After hearing that, she tells me that she would like to be bigger.  She did not have any significant discomfort after left expander fills. ?  ?On exam, the skin on her left breast does feel a bit more taut than the right side.  There is also more superior pole fullness compared to the right side which appears to be mildly lower on the chest.  No redness or subcutaneous fluid collections noted.  Healed well. ?  ?She feels prepared for additional expander fill. We placed injectable saline in the Expander using a sterile technique: ?Right: 50 cc for a total of 490 / 450  cc ?Left: 50 cc for a total of 490 / 450 cc ?  ?After conclusion of expander fill, she does state that the left side is starting to feel a bit more tight.  Do not know if she will be able to tolerate additional fills

## 2021-08-09 DIAGNOSIS — J3089 Other allergic rhinitis: Secondary | ICD-10-CM | POA: Diagnosis not present

## 2021-08-09 DIAGNOSIS — J301 Allergic rhinitis due to pollen: Secondary | ICD-10-CM | POA: Diagnosis not present

## 2021-08-09 DIAGNOSIS — J3081 Allergic rhinitis due to animal (cat) (dog) hair and dander: Secondary | ICD-10-CM | POA: Diagnosis not present

## 2021-08-13 NOTE — Progress Notes (Signed)
Patient is a pleasant 51 year old female with PMH of left-sided breast cancer s/p bilateral breast reconstruction with tissue expanders and Flex HD performed 05/15/2021 by Dr. Claudia Desanctis.  Patient was a 70 B/C cup prior to her mastectomy and reconstruction. ?  ?Patient was last seen here in clinic on 08/01/2021.  At that time, she expressed that she would like to be larger than present size.  Additional expander fill was provided for a total of 490/450 cc each side.  However, at conclusion of fill she felt tight.  Unsure if she would be able to accommodate additional fills given taut skin.  Plan was for her to return in 2 weeks for additional evaluation +/- final fill. ? ?Today, patient is doing okay.  She has been having some tightness and muscular discomfort from her expansion, most on the right side.  She states that it is usually positional.  Comes and goes.  Improves with ibuprofen.  After consideration, does not feel as though she wants to go any bigger.  She is comfortable with being the size.  She was a C cup prior to surgery and would like to be similar in size.  If she is not a D, that is okay. ? ?Physical exam reassuring.  No superficial skin necrosis.  It is taut however, would not want to expand further.  No erythema or bruising. ? ?We will proceed with the second phase of her reconstruction and get her booked for implant exchange.  We will prescribe her a short course of Robaxin for her to take to help with any spasms or discomfort at night.   ? ?Did not need to update photos as we obtain pictures at last encounter.  ?Right: 0 cc for a total of 490 / 450 cc ?Left: 0 cc for a total of 490 / 450 cc ? ?

## 2021-08-15 ENCOUNTER — Encounter: Payer: Self-pay | Admitting: Physician Assistant

## 2021-08-15 ENCOUNTER — Other Ambulatory Visit (HOSPITAL_COMMUNITY): Payer: Self-pay

## 2021-08-15 ENCOUNTER — Ambulatory Visit: Payer: 59 | Admitting: Physician Assistant

## 2021-08-15 DIAGNOSIS — Z9889 Other specified postprocedural states: Secondary | ICD-10-CM

## 2021-08-16 ENCOUNTER — Telehealth: Payer: Self-pay | Admitting: Physician Assistant

## 2021-08-16 DIAGNOSIS — J3081 Allergic rhinitis due to animal (cat) (dog) hair and dander: Secondary | ICD-10-CM | POA: Diagnosis not present

## 2021-08-16 DIAGNOSIS — J3089 Other allergic rhinitis: Secondary | ICD-10-CM | POA: Diagnosis not present

## 2021-08-16 DIAGNOSIS — J301 Allergic rhinitis due to pollen: Secondary | ICD-10-CM | POA: Diagnosis not present

## 2021-08-16 NOTE — Telephone Encounter (Signed)
Patient called because pharmacy states that she does not have the prescription for muscle relaxer that Garett ordered for her. Patient would like for provider to call this in when possible.  ?

## 2021-08-17 ENCOUNTER — Other Ambulatory Visit (HOSPITAL_COMMUNITY): Payer: Self-pay

## 2021-08-17 MED ORDER — METHOCARBAMOL 500 MG PO TABS
500.0000 mg | ORAL_TABLET | Freq: Two times a day (BID) | ORAL | 0 refills | Status: DC | PRN
  Filled 2021-08-17: qty 20, 10d supply, fill #0

## 2021-08-17 NOTE — Telephone Encounter (Signed)
Resent.  Thanks.

## 2021-08-17 NOTE — Addendum Note (Signed)
Addended by: Krista Blue on: 08/17/2021 12:51 PM ? ? Modules accepted: Orders ? ?

## 2021-08-21 ENCOUNTER — Inpatient Hospital Stay (HOSPITAL_BASED_OUTPATIENT_CLINIC_OR_DEPARTMENT_OTHER): Payer: 59 | Admitting: Internal Medicine

## 2021-08-21 ENCOUNTER — Encounter: Payer: Self-pay | Admitting: Internal Medicine

## 2021-08-21 DIAGNOSIS — R232 Flushing: Secondary | ICD-10-CM | POA: Diagnosis not present

## 2021-08-21 DIAGNOSIS — Z170 Estrogen receptor positive status [ER+]: Secondary | ICD-10-CM | POA: Diagnosis not present

## 2021-08-21 DIAGNOSIS — C50412 Malignant neoplasm of upper-outer quadrant of left female breast: Secondary | ICD-10-CM | POA: Diagnosis not present

## 2021-08-21 DIAGNOSIS — Z9013 Acquired absence of bilateral breasts and nipples: Secondary | ICD-10-CM | POA: Diagnosis not present

## 2021-08-21 DIAGNOSIS — Z79899 Other long term (current) drug therapy: Secondary | ICD-10-CM | POA: Diagnosis not present

## 2021-08-21 DIAGNOSIS — Z79810 Long term (current) use of selective estrogen receptor modulators (SERMs): Secondary | ICD-10-CM | POA: Diagnosis not present

## 2021-08-21 NOTE — Progress Notes (Signed)
[facility] [place] ?CONSULT NOTE ? ?Patient Care Team: ?Bo Merino, FNP as PCP - General (Nurse Practitioner) ?Clent Jacks, RN as Oncology Nurse Navigator ?Theodore Demark, RN as Oncology Nurse Navigator ?Jules Husbands, MD as Consulting Physician (General Surgery) ?Cammie Sickle, MD as Consulting Physician (Oncology) ?Cindra Presume, MD as Consulting Physician (Plastic Surgery) ? ?CHIEF COMPLAINTS/PURPOSE OF CONSULTATION: Breast cancer ? ? ?  ?Oncology History Overview Note  ?# On physical exam, I palpate a focal firm 2 x 5 cm masslike area over ?the 11 to 1:30 position of the left breast approximately 2-4 cm from ?the nipple. ?  ?Targeted ultrasound is performed, showing no discrete focal ?abnormality over the upper mid to outer left breast. There is a ?pattern of dense fibroglandular tissue over this area which appears ?slightly different from the dense fibroglandular tissue elsewhere in ?the left breast. There is hazy decreased echogenicity with subtle ?shadowing within this dense tissue at the 12 to 1 o'clock position. ?Some of this vague hazy decreased echogenicity with shadowing is ?present at the 12 o'clock position 4 cm from the nipple more focal ?with harmonics and measures approximately 1.4 x 1.6 x 2 cm. There is ?another more focal area of decreased echogenicity and shadowing at ?the 1 o'clock position of the left breast 2 cm from the nipple ?measuring 1.4 x 1.5 x 1.5 cm. These 2 areas appear to be contiguous ?and are likely part of the same process. ? ?1.4 x 1.6 x 2 cm. There is ?another more focal area of decreased echogenicity and shadowing at ?the 1 o'clock position of the left breast 2 cm from the nipple ?measuring 1.4 x 1.5 x 1.5 cm. These 2 areas appear to be contiguous ?and are likely part of the same process. ? ?DIAGNOSIS:  ?A. LEFT BREAST, 12:00 4CMFN; ULTRASOUND-GUIDED BIOPSY:  ?- INVASIVE MAMMARY CARCINOMA, NO SPECIAL TYPE.  ? ?Size of invasive carcinoma: 2 mm in this  sample  ?Histologic grade of invasive carcinoma: Grade 2  ?                     Glandular/tubular differentiation score: 3  ?                     Nuclear pleomorphism score: 2  ?                     Mitotic rate score: 1  ?                     Total score: 6  ?Ductal carcinoma in situ: Present, intermediate grade  ?Lymphovascular invasion: Not identified  ?  ?Ultrasound the left axilla is normal. ?CASE SUMMARY: BREAST BIOMARKER TESTS  ?Estrogen Receptor (ER) Status: POSITIVE  ?        Percentage of cells with nuclear positivity: 90-100%  ?        Average intensity of staining: Strong  ? ?Progesterone Receptor (PgR) Status: POSITIVE  ?        Percentage of cells with nuclear positivity: 90-100%  ?        Average intensity of staining: Strong  ? ?HER2 (by immunohistochemistry): NEGATIVE (Score 1+)  ?Ki-67: Not performed ? ?Procedure: Simple mastectomy  ?Specimen Laterality: Left (bilateral mastectomy, tumor in left)  ? ?TUMOR  ?Histologic Type: Invasive mammary carcinoma, no special type  ?Histologic Grade (Nottingham Histologic Score)  ?  Glandular (Acinar)/Tubular Differentiation: 2  ?                     Nuclear Pleomorphism: 2  ?                     Mitotic Rate: 2  ?                     Overall Grade: 2  ?Tumor Size: 10 mm  ?Ductal Carcinoma In Situ (DCIS): Present, with extensive intraductal  ?component  ?Lymphovascular Invasion: Not identified  ?Treatment Effect in the Breast: No known presurgical treatment  ? ?MARGINS  ?Margin Status for Invasive Carcinoma: All margins negative for invasive  ?carcinoma  ?                     Distance from invasive carcinoma to closest margin:  ?1.2 cm, posterior  ? ?Margin Status for DCIS: Margin(s) Involved by DCIS: All margins negative  ?for DCIS  ?                     Distance from DCIS to closest margin: 1.2 cm,  ?posterior  ? ?REGIONAL LYMPH NODES  ?Regional Lymph Node Status: All regional lymph nodes negative for tumor  ?                     Total  Number of Lymph Nodes Examined (sentinel and  ?non-sentinel): 3  ?                     Number of Sentinel Nodes Examined: 3  ? ?DISTANT METASTASIS  ?Distant Site(s) Involved, if applicable: Not applicable  ? ?PATHOLOGIC STAGE CLASSIFICATION (pTNM, AJCC 8th Edition):  ?TNM Descriptors: M (multifocal)  ?pT Category: pT1b  ?Regional Lymph Nodes Modifier: sn  ?pN Category: pN0  ?pM Category: Not applicable  ? ?SPECIAL STUDIES  ?Breast Biomarker Testing Performed on Previous Biopsy: [id number]  ? ?Estrogen Receptor (ER) Status: POSITIVE  ?Progesterone Receptor (PgR) Status: POSITIVE  ?HER2 (by immunohistochemistry): NEGATIVE (Score 1+)  ? 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 ? ?#June 19, 2021-tamoxifen 20 mg a day [ estrogen/LH-?  Postmenopausal].  ? ?TAH; intact ovaries  ?  ?Carcinoma of upper-outer quadrant of left breast in female, estrogen receptor positive ([place])  ?03/21/2021 Initial Diagnosis  ? Carcinoma of upper-outer quadrant of left breast in female, estrogen receptor positive ([place]) ?  ? Genetic Testing  ? 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 VUS in RAD51D called c.434G> and in [facility] called c.476C>T also identified. The final report date is 05/09/2020. ? ?The CancerNext-Expanded + RNAinsight gene panel offered by [employer] and includes sequencing and rearrangement analysis for the following 77 genes: IP, ALK, APC*, ATM*, AXIN2, BAP1, BARD1, BLM, BMPR1A, BRCA1*, BRCA2*, BRIP1*, CDC73, CDH1*,CDK4, CDKN1B, CDKN2A, CHEK2*, CTNNA1, DICER1, FANCC, FH, FLCN, GALNT12, KIF1B, LZTR1, MAX, MEN1, MET, MLH1*, MSH2*, MSH3, MSH6*, MUTYH*, NBN, NF1*, NF2, NTHL1, PALB2*, PHOX2B, PMS2*, POT1, PRKAR1A, PTCH1, PTEN*, RAD51C*, RAD51D*,RB1, RECQL, RET, SDHA, SDHAF2, SDHB, SDHC, SDHD, SMAD4,  SMARCA4, SMARCB1, SMARCE1, STK11, SUFU, TMEM127, TP53*,TSC1, TSC2, VHL and XRCC2 (sequencing and deletion/duplication); EGFR, EGLN1, HOXB13, KIT, MITF, PDGFRA, POLD1 and POLE (sequencing only); EPCAM and GREM1 (deletion/duplication only).  ?  ?05/29/2021 Cancer Staging  ? Staging form:  Breast, AJCC 8th Edition ?- Pathologic: pT1b, pN0, cM0, ER+, PR+, HER2- - Signed by Cammie Sickle, MD on 05/29/2021 ?Nuclear grade: G2 ? ?  ? ? ? ?HISTORY OF PRESENTING ILLNESS: Alone.  Ambulating independently. ? ?Tracey Huff 51 y.o.  female patient with stage I ER/PR positive HER2 negative left breast cancer  [CHEK2 mutant] s/p bilateral mastectomies on tamoxifen is here for follow-up. ? ?Patient is clinically doing well.  Complains of hot flashes mild to moderate intense. ? ?Review of Systems  ?Constitutional:  Negative for chills, diaphoresis, fever, malaise/fatigue and weight loss.  ?HENT:  Negative for nosebleeds and sore throat.   ?Eyes:  Negative for double vision.  ?Respiratory:  Negative for cough, hemoptysis, sputum production, shortness of breath and wheezing.   ?Cardiovascular:  Negative for chest pain, palpitations, orthopnea and leg swelling.  ?Gastrointestinal:  Negative for abdominal pain, blood in stool, constipation, diarrhea, heartburn, melena, nausea and vomiting.  ?Genitourinary:  Negative for dysuria, frequency and urgency.  ?Musculoskeletal:  Negative for back pain and joint pain.  ?Skin: Negative.  Negative for itching and rash.  ?Neurological:  Negative for dizziness, tingling, focal weakness, weakness and headaches.  ?Endo/Heme/Allergies:  Does not bruise/bleed easily.  ?Psychiatric/Behavioral:  Negative for depression. The patient is not nervous/anxious and does not have insomnia.    ? ?MEDICAL HISTORY:  ?Past Medical History:  ?Diagnosis Date  ?? Allergy   ?? Asthma   ?? Breast cancer ([place])   ? INVASIVE MAMMARY CARCINOMA- left  ?? Family history of prostate cancer   ?? Fibroid uterus 11/15/2014   ?? Headache   ?? Hyperlipidemia   ?? Hypertension   ?? Menorrhagia with irregular cycle 11/15/2014  ?? Pneumonia   ?? Pre-diabetes   ?? Shortness of breath dyspnea   ? ? ?SURGICAL HISTORY: ?Past Surgical

## 2021-08-21 NOTE — Assessment & Plan Note (Addendum)
#  Left breast cancer T84mN0- G-2; ER/PR positive HER2 negative status BIL postmastectomy [positive for CHEK-2 mutation].  Oncotype recurrence score 16/LOW. On Tamoxifen [s/p hysterectomy [intact ovaries]; no role for mammograms. ? ?#Patient tolerated tamoxifen fairly well except for hot flashes.  ? ?# Hot flashes-grade 1 through 2; monitor for now.  If worse will consider pharmacologic therapy. ? ? ?# DISPOSITION:  ?# follow up 3 months- MD; no labs- Dr.B ? ? ?

## 2021-08-23 DIAGNOSIS — J3089 Other allergic rhinitis: Secondary | ICD-10-CM | POA: Diagnosis not present

## 2021-08-23 DIAGNOSIS — J301 Allergic rhinitis due to pollen: Secondary | ICD-10-CM | POA: Diagnosis not present

## 2021-08-23 DIAGNOSIS — J3081 Allergic rhinitis due to animal (cat) (dog) hair and dander: Secondary | ICD-10-CM | POA: Diagnosis not present

## 2021-08-25 ENCOUNTER — Other Ambulatory Visit: Payer: Self-pay | Admitting: Physician Assistant

## 2021-08-30 ENCOUNTER — Other Ambulatory Visit (HOSPITAL_COMMUNITY): Payer: Self-pay

## 2021-08-30 DIAGNOSIS — J3089 Other allergic rhinitis: Secondary | ICD-10-CM | POA: Diagnosis not present

## 2021-08-30 DIAGNOSIS — J301 Allergic rhinitis due to pollen: Secondary | ICD-10-CM | POA: Diagnosis not present

## 2021-08-30 DIAGNOSIS — J3081 Allergic rhinitis due to animal (cat) (dog) hair and dander: Secondary | ICD-10-CM | POA: Diagnosis not present

## 2021-08-30 DIAGNOSIS — J4530 Mild persistent asthma, uncomplicated: Secondary | ICD-10-CM | POA: Diagnosis not present

## 2021-08-30 DIAGNOSIS — H1045 Other chronic allergic conjunctivitis: Secondary | ICD-10-CM | POA: Diagnosis not present

## 2021-08-30 MED ORDER — MONTELUKAST SODIUM 10 MG PO TABS
10.0000 mg | ORAL_TABLET | Freq: Every day | ORAL | 6 refills | Status: DC
  Filled 2021-08-30: qty 90, 90d supply, fill #0

## 2021-08-30 MED ORDER — TRELEGY ELLIPTA 200-62.5-25 MCG/ACT IN AEPB
1.0000 | INHALATION_SPRAY | Freq: Every day | RESPIRATORY_TRACT | 6 refills | Status: DC
  Filled 2021-08-30: qty 60, 30d supply, fill #0

## 2021-08-30 MED ORDER — LEVOCETIRIZINE DIHYDROCHLORIDE 5 MG PO TABS
5.0000 mg | ORAL_TABLET | Freq: Every evening | ORAL | 6 refills | Status: DC
  Filled 2021-08-30: qty 90, 90d supply, fill #0

## 2021-08-30 MED ORDER — ALBUTEROL SULFATE HFA 108 (90 BASE) MCG/ACT IN AERS
1.0000 | INHALATION_SPRAY | RESPIRATORY_TRACT | 0 refills | Status: DC | PRN
  Filled 2021-08-30: qty 18, 25d supply, fill #0

## 2021-09-04 ENCOUNTER — Ambulatory Visit (INDEPENDENT_AMBULATORY_CARE_PROVIDER_SITE_OTHER): Payer: 59 | Admitting: Psychologist

## 2021-09-04 DIAGNOSIS — F4322 Adjustment disorder with anxiety: Secondary | ICD-10-CM

## 2021-09-04 NOTE — Progress Notes (Signed)
[facility] Counselor/Therapist Progress Note ? ?Patient ID: Tracey Huff, MRN: [id number],   ? ?Date: 09/04/2021 ? ?Time Spent: 3:03 pm to 3:42 pm; total time: 39 minutes ? ? This session was held via in person. The patient consented to in-person therapy and was in the clinician's office. Limits of confidentiality were discussed with the patient.  ? ?Treatment Type: Individual Therapy ? ?Reported Symptoms: Anxiety and stressors ? ?Mental Status Exam: ?Appearance:  Well Groomed     ?Behavior: Appropriate  ?Motor: Normal  ?Speech/Language:  Clear and Coherent  ?Affect: Appropriate  ?Mood: normal  ?Thought process: normal  ?Thought content:   WNL  ?Sensory/Perceptual disturbances:   WNL  ?Orientation: oriented to person, place, and time/date  ?Attention: Good  ?Concentration: Good  ?Memory: WNL  ?Fund of knowledge:  Good  ?Insight:   Fair  ?Judgment:  Good  ?Impulse Control: Good  ? ?Risk Assessment: ?Danger to Self:  No ?Self-injurious Behavior: No ?Danger to Others: No ?Duty to Warn:no ?Physical Aggression / Violence:No  ?Access to Firearms a concern: No  ?Gang Involvement:No  ? ?Subjective: Beginning the session, patient described herself as doing well while talking about her upcoming surgery. She voiced that her husband is aware to an extent about the surgery. She also talked about how she completed her homework assignment and reflected on how it assisted her. From there, she voiced frustrations within the family system in particular due to her husband's side. She described herself as feeling as though is in a "box" to protect herself. She also talked about how there is a "wedge" between her and her husband due to being a spouse and a caregiver. She began to explore how to remove that wedge. She also reflected on self-care. She was agreeable to homework and following up. She denied suicidal and homicidal ideation.   ? ?Interventions:  Worked on developing a therapeutic relationship using active  listening and reflective statements. Provided emotional support using empathy and validation. Used summary statements. Praised that patient for doing better and explored what has assisted the patient. Reflected on emotions related to the upcoming surgery. Processed how the patient felt about telling  her husband about the surgery. Reviewed the homework assignment. Praised the patient for completing the assignment. Reflected on the challenging family dynamics. Used socratic questions to assist the patient gain insight. Reflected on whether different values were contributing to the conflict expressed. Explored the theme of being a spouse and a caregiver. Normalized and validated expressed thoughts related to that theme. Began to explore how to differentiate between spouse and caregiver. Challenged the patient to express concerns to her spouse. Reflected on ways to remove the "wedge" between the dichotomy of being a spouse and a caregiver. Provided empathic statements. Assigned homework. Assessed for suicidal and homicidal ideation.  ? ?Homework: Reflect on what needs to be removed to address the "wedge" ? ?Next Session: Review homework and emotional support.  ? ?Diagnosis: F43.22 adjustment disorder with anxiety  ? ?Plan:  ? ?Goals ?Work through the grieving process and face reality of own death ?Accept emotional support from others around them ?Live life to the fullest, event though time may be limited ?Become as knowledgeable about the medical condition  ?Reduce fear, anxiety about the health condition  ?Accept the illness ?Accept the role of psychological and behavioral factors  ?Stabilize anxiety level wile increasing ability to function ?Learn and implement coping skills that result in a reduction of anxiety  ?Alleviate depressive symptoms ?Recognize, accept, and cope  with depressive feelings ?Develop healthy thinking patterns ?Develop healthy interpersonal relationships ? ?Objectives target date for all  objectives is 07/10/2022 ?Identify feelings associated with the illness ?Family members share with each other feelings ?Identify the losses or limitations that have been experienced ?Verbalize acceptance of the reality of the medical condition ?Commit to learning and implement a proactive approach to managing personal stresses ?Verbalize an understanding of the medical condition ?Work with therapist to develop a plan for coping with stress ?Learn and implement skills for managing stress ?Engage in social, productive activities that are possible ?Engage in faith based activities ?implement positive imagery ?Identify coping skills and sources of emotional support ?Patient's partner and family members verbalize their fears regarding severity of health condition ?Identify sources of emotional distress  ?Learning and implement calming skills to reduce overall anxiety ?Learn and implement problem solving strategies ?Identify and engage in pleasant activities ?Learning and implement personal and interpersonal skills to reduce anxiety and improve interpersonal relationships ?Learn to accept limitations in life and commit to tolerating, rather than avoiding, unpleasant emotions while accomplishing meaningful goals ?Identify major life conflicts from the past and present that form the basis for present anxiety ?Learn and implement behavioral strategies ?Verbalize an understanding and resolution of current interpersonal problems ?Learn and implement problem solving and decision making skills ?Learn and implement conflict resolution skills to resolve interpersonal problems ?Verbalize an understanding of healthy and unhealthy emotions verbalize insight into how past relationships may be influence current experiences with depression ?Use mindfulness and acceptance strategies and increase value based behavior  ?Increase hopeful statements about the future.  ? ?Interventions ?Teach about stress and ways to handle stress ?Assist the  patient in developing a coping action plan for stressors ?Conduct skills based training for coping strategies ?Train problem focused skills ?Sort out what activities the individual can do ?Encourage patient to rely upon his/her spiritual faith ?Teach the patient to use guided imagery ?Probe and evaluate family's ability to provide emotional support ?Allow family to share their fears ?Assist the patient in identifying, sorting through, and verbalizing the various feelings generated by his/her medical condition ?Meet with family members  ?Ask patient list out limitations  ?Use stress inoculation training  ?Use Acceptance and Commitment Therapy to help client accept uncomfortable realities in order to accomplish value-consistent goals ?Reinforce the client's insight into the role of his/her past emotional pain and present anxiety  ?Discuss examples demonstrating that unrealistic worry overestimates the probability of threats and underestimate patient's ability  ?Assist the patient in analyzing his or her worries ?Help patient understand that avoidance is reinforcing  ?Behavioral activation help the client explore the relationship, nature of the dispute,  ?Help the client develop new interpersonal skills and relationships ?Conduct Problem so living therapy ?Teach conflict resolution skills ?Use a process-experiential approach ?Conduct TLDP ?Conduct ACT ? ?The patient and clinician reviewed the treatment plan on 08/07/2021. The patient approved of the treatment plan.  ? ?Conception Chancy, PsyD ? ? ?

## 2021-09-06 ENCOUNTER — Other Ambulatory Visit (HOSPITAL_COMMUNITY): Payer: Self-pay

## 2021-09-06 ENCOUNTER — Other Ambulatory Visit: Payer: Self-pay | Admitting: Internal Medicine

## 2021-09-06 DIAGNOSIS — J301 Allergic rhinitis due to pollen: Secondary | ICD-10-CM | POA: Diagnosis not present

## 2021-09-06 DIAGNOSIS — J3081 Allergic rhinitis due to animal (cat) (dog) hair and dander: Secondary | ICD-10-CM | POA: Diagnosis not present

## 2021-09-06 DIAGNOSIS — J3089 Other allergic rhinitis: Secondary | ICD-10-CM | POA: Diagnosis not present

## 2021-09-07 ENCOUNTER — Other Ambulatory Visit (HOSPITAL_COMMUNITY): Payer: Self-pay

## 2021-09-10 ENCOUNTER — Ambulatory Visit: Payer: 59 | Admitting: Surgery

## 2021-09-12 ENCOUNTER — Other Ambulatory Visit (HOSPITAL_COMMUNITY): Payer: Self-pay

## 2021-09-12 MED ORDER — TAMOXIFEN CITRATE 20 MG PO TABS
20.0000 mg | ORAL_TABLET | Freq: Every day | ORAL | 1 refills | Status: DC
  Filled 2021-09-12: qty 90, 90d supply, fill #0
  Filled 2022-01-02: qty 90, 90d supply, fill #1

## 2021-09-13 DIAGNOSIS — J3081 Allergic rhinitis due to animal (cat) (dog) hair and dander: Secondary | ICD-10-CM | POA: Diagnosis not present

## 2021-09-13 DIAGNOSIS — J3089 Other allergic rhinitis: Secondary | ICD-10-CM | POA: Diagnosis not present

## 2021-09-13 DIAGNOSIS — J301 Allergic rhinitis due to pollen: Secondary | ICD-10-CM | POA: Diagnosis not present

## 2021-09-17 ENCOUNTER — Encounter: Payer: Self-pay | Admitting: Surgery

## 2021-09-17 ENCOUNTER — Ambulatory Visit (INDEPENDENT_AMBULATORY_CARE_PROVIDER_SITE_OTHER): Payer: 59 | Admitting: Physician Assistant

## 2021-09-17 ENCOUNTER — Encounter: Payer: Self-pay | Admitting: Nurse Practitioner

## 2021-09-17 ENCOUNTER — Encounter: Payer: Self-pay | Admitting: Physician Assistant

## 2021-09-17 ENCOUNTER — Other Ambulatory Visit (HOSPITAL_COMMUNITY): Payer: Self-pay

## 2021-09-17 ENCOUNTER — Ambulatory Visit: Payer: 59 | Admitting: Surgery

## 2021-09-17 VITALS — BP 139/82 | HR 88 | Ht 60.0 in | Wt 180.0 lb

## 2021-09-17 VITALS — BP 143/87 | HR 89 | Temp 98.6°F | Ht 60.0 in | Wt 180.0 lb

## 2021-09-17 DIAGNOSIS — C50812 Malignant neoplasm of overlapping sites of left female breast: Secondary | ICD-10-CM

## 2021-09-17 DIAGNOSIS — Z170 Estrogen receptor positive status [ER+]: Secondary | ICD-10-CM

## 2021-09-17 DIAGNOSIS — C50412 Malignant neoplasm of upper-outer quadrant of left female breast: Secondary | ICD-10-CM

## 2021-09-17 MED ORDER — OXYCODONE HCL 5 MG PO TABS
5.0000 mg | ORAL_TABLET | Freq: Four times a day (QID) | ORAL | 0 refills | Status: AC | PRN
  Filled 2021-09-17: qty 20, 5d supply, fill #0

## 2021-09-17 MED ORDER — ONDANSETRON 4 MG PO TBDP
4.0000 mg | ORAL_TABLET | Freq: Three times a day (TID) | ORAL | 0 refills | Status: DC | PRN
  Filled 2021-09-17: qty 20, 7d supply, fill #0

## 2021-09-17 NOTE — H&P (View-Only) (Signed)
Patient ID: Tracey Huff, female    DOB: 23-May-1970, 51 y.o.   MRN: [id number]  Chief Complaint  Patient presents with   Pre-op Exam      ICD-10-CM   1. Carcinoma of upper-outer quadrant of left breast in female, estrogen receptor positive ([place])  C50.412    Z17.0        History of Present Illness: Tracey Huff is a 51 y.o.  female  with a history of left-sided breast cancer s/p reconstruction using tissue expanders and Flex HD 05/15/2021 by Dr. Claudia Desanctis.  She presents for preoperative evaluation for upcoming procedure, bilateral implant exchange, scheduled for 10/02/2021 with Dr. Claudia Desanctis.  The patient has not had problems with anesthesia.  No history of CVA, MI, or clot.  No family history of clots or clotting disorders.  Patient confirms that she would like to be a full C or small D cup.  Confirms that she is pleased with current size.  Plan to hold her tamoxifen 2 weeks before and after surgery.  Will confirm with oncology.  Hold vitamins and supplements 5 days before surgery.  Hold any NSAIDs 72 hours before surgery.  She does not take aspirin or other blood thinners.  Summary of Previous Visit: Patient was last seen here in clinic 08/15/2021.  She confirms that she was a 4 B/C cup prior to her mastectomy and reconstruction.  She is currently 490/450 cc each side.  She felt comfortable with current size of expanders.  On exam, skin was taut.  Decision was made to avoid any further expander fills.  Plan was for proceeding with second phase of her reconstruction via implant exchange.  Job: Chartered certified accountant.  PMH Significant for: Left-sided breast cancer, HTN, asthma.   Past Medical History: Allergies: Allergies  Allergen Reactions   Macadamia Nut Oil Shortness Of Breath   Apple Juice Nausea And Vomiting   [place] Nut (Berthollefia [place]) Skin Test Nausea And Vomiting and Swelling    Tongue swelling   Carrot Oil     Can not cooked raw carrots   Fruit & Vegetable Daily [Nutritional  Supplements] Nausea And Vomiting    Cannot tolerate apples, peaches, plums, and nectarines   Kiwi Extract Nausea And Vomiting   Peach [Prunus Persica] Nausea And Vomiting    Current Medications:  Current Outpatient Medications:    albuterol (PROVENTIL HFA) 108 (90 Base) MCG/ACT inhaler, Inhale 1 to 2 puffs every 4-6 hours as needed for cough/wheeze, Disp: 18 g, Rfl: 0   albuterol (PROVENTIL HFA) 108 (90 Base) MCG/ACT inhaler, Inhale 1-2 puffs into the lungs every 4-6 hours as needed for cough/wheeze, Disp: 18 g, Rfl: 0   Ascorbic Acid (VITAMIN C) 100 MG tablet, Take 100 mg by mouth daily., Disp: , Rfl:    atorvastatin (LIPITOR) 20 MG tablet, Take 1 tablet (20 mg total) by mouth at bedtime. (Patient taking differently: Take 10 mg by mouth every other day. Bedtime), Disp: 90 tablet, Rfl: 3   AUVI-Q 0.3 MG/0.3ML SOAJ injection, Inject 0.3 mg into the muscle as needed for anaphylaxis., Disp: , Rfl:    azelastine (ASTELIN) 0.1 % nasal spray, Place 1-2 sprays into both nostrils 2 (two) times daily. (Patient taking differently: Place 1-2 sprays into both nostrils daily as needed for rhinitis or allergies.), Disp: 30 mL, Rfl: 6   busPIRone (BUSPAR) 7.5 MG tablet, Take 1 tablet once daily at night; may take 1 additional tablet daily if needed for anxiety. (Patient taking differently: Take  7.5 mg by mouth as needed. Take 1 tablet once daily at night; may take 1 additional tablet daily if needed for anxiety.), Disp: 180 tablet, Rfl: 1   Cholecalciferol 25 MCG (1000 UT) capsule, Take 1,000 Units by mouth daily., Disp: , Rfl:    diphenhydrAMINE (BENADRYL) 2 % cream, Apply 1 application topically 2 (two) times daily as needed for itching., Disp: , Rfl:    ELDERBERRY PO, Take 1 capsule by mouth daily., Disp: , Rfl:    fluticasone (FLONASE) 50 MCG/ACT nasal spray, Place 1-2 sprays into both nostrils daily. (Patient taking differently: Place 1-2 sprays into both nostrils daily as needed for allergies or  rhinitis.), Disp: 16 g, Rfl: 6   Fluticasone-Umeclidin-Vilant (TRELEGY ELLIPTA) 200-62.5-25 MCG/ACT AEPB, Inhale 1 puff into the lungs daily. (Patient taking differently: Inhale 1 puff into the lungs every morning.), Disp: 60 each, Rfl: 6   Fluticasone-Umeclidin-Vilant (TRELEGY ELLIPTA) 200-62.5-25 MCG/ACT AEPB, Inhale 1 puff into the lungs daily., Disp: 60 each, Rfl: 6   HYDROcodone-acetaminophen (NORCO/VICODIN) 5-325 MG tablet, Take 1 tablet by mouth every 6 (six) hours as needed for moderate pain., Disp: , Rfl:    hydrocortisone cream 1 %, Apply 1 application topically daily as needed for itching., Disp: , Rfl:    hydrOXYzine (VISTARIL) 25 MG capsule, TAKE 1 CAPSULE (25 MG TOTAL) BY MOUTH AT BEDTIME AS NEEDED FOR UP TO 30 DOSES (DIFFICULTY SLEEPING)., Disp: 30 capsule, Rfl: 1   ibuprofen (ADVIL) 200 MG tablet, Take 400 mg by mouth every 6 (six) hours as needed for moderate pain., Disp: , Rfl:    ipratropium-albuterol (DUONEB) 0.5-2.5 (3) MG/3ML SOLN, Take 3 mLs by nebulization every 6 (six) hours as needed (wheeze, SOB, cough variant asthma)., Disp: 360 mL, Rfl: 1   levocetirizine (XYZAL) 5 MG tablet, Take 1 tablet (5 mg total) by mouth every evening., Disp: 30 tablet, Rfl: 6   levocetirizine (XYZAL) 5 MG tablet, Take 1 tablet (5 mg total) by mouth every evening., Disp: 30 tablet, Rfl: 6   losartan (COZAAR) 25 MG tablet, Take 1 tablet (25 mg total) by mouth every morning., Disp: 90 tablet, Rfl: 1   methocarbamol (ROBAXIN) 500 MG tablet, Take 1 tablet (500 mg total) by mouth 2 (two) times daily as needed for muscle spasms for up to 10 days, Disp: 20 tablet, Rfl: 0   montelukast (SINGULAIR) 10 MG tablet, Take 1 tablet (10 mg total) by mouth every evening., Disp: 30 tablet, Rfl: 6   montelukast (SINGULAIR) 10 MG tablet, Take 1 tablet (10 mg total) by mouth daily., Disp: 30 tablet, Rfl: 6   Olopatadine HCl (PATADAY) 0.2 % SOLN, Place 1 drop into affected eye daily. (Patient taking differently: Place 1  drop into both eyes daily as needed (itching).), Disp: 2.5 mL, Rfl: 6   pregabalin (LYRICA) 50 MG capsule, Take 1 capsule (50 mg total) by mouth 3 (three) times daily., Disp: 30 capsule, Rfl: 0   tamoxifen (NOLVADEX) 20 MG tablet, Take 1 tablet (20 mg total) by mouth daily., Disp: 90 tablet, Rfl: 1  Past Medical Problems: Past Medical History:  Diagnosis Date   Allergy    Asthma    Breast cancer ([place])    INVASIVE MAMMARY CARCINOMA- left   Family history of prostate cancer    Fibroid uterus 11/15/2014   Headache    Hyperlipidemia    Hypertension    Menorrhagia with irregular cycle 11/15/2014   Pneumonia    Pre-diabetes    Shortness of breath dyspnea  Past Surgical History: Past Surgical History:  Procedure Laterality Date   ABDOMINAL HYSTERECTOMY N/A 11/15/2014   Procedure: HYSTERECTOMY ABDOMINAL/BILATERAL SALPINGECTOMY ;  Surgeon: Will Bonnet, MD;  Location: [facility] ORS;  Service: Gynecology;  Laterality: N/A;   BREAST BIOPSY Right    2019 negative   BREAST BIOPSY Left 03/09/2021   u/s biopsy, 12-1 o'clock, VENUS" clip-path pending   BREAST LUMPECTOMY,RADIO FREQ LOCALIZER,AXILLARY SENTINEL LYMPH NODE BIOPSY Left 03/28/2021   Procedure: BREAST LUMPECTOMY,RADIO FREQ LOCALIZER,AXILLARY SENTINEL LYMPH NODE BIOPSY;  Surgeon: Jules Husbands, MD;  Location: [facility] ORS;  Service: General;  Laterality: Left;   BREAST RECONSTRUCTION WITH PLACEMENT OF TISSUE EXPANDER AND FLEX HD (ACELLULAR HYDRATED DERMIS) Bilateral 05/15/2021   Procedure: BILATERAL BREAST RECONSTRUCTION WITH PLACEMENT OF TISSUE EXPANDER AND FLEX HD (ACELLULAR HYDRATED DERMIS);  Surgeon: Cindra Presume, MD;  Location: [facility] OR[facility];  Service: Plastics;  Laterality: Bilateral;   [place]   COLPOSCOPY  07/28/2018   CYSTOSCOPY N/A 11/15/2014   Procedure: CYSTOSCOPY;  Surgeon: Will Bonnet, MD;  Location: [facility] OR[facility];  Service: Gynecology;  Laterality: N/A;   DILATION AND CURETTAGE OF UTERUS     GANGLION CYST  EXCISION Left    TOTAL MASTECTOMY Bilateral 05/15/2021   Procedure: TOTAL MASTECTOMY, Bilateral Simple Mastectomy, skin sparing;  Surgeon: Jules Husbands, MD;  Location: [facility] ORS;  Service: General;  Laterality: Bilateral;  Provider requesting for 2 hours / 120 min for procedure.   UTERINE FIBROID SURGERY      Social History: Social History   Socioeconomic History   Marital status: Married    Spouse name: Fritz Pickerel   Number of children: 2   Years of education: Not on file   Highest education level: Not on file  Occupational History   Not on file  Tobacco Use   Smoking status: Never    Passive exposure: Never   Smokeless tobacco: Never  Vaping Use   Vaping Use: Never used  Substance and Sexual Activity   Alcohol use: Not Currently    Comment: occassional   Drug use: No   Sexual activity: Yes    Partners: Male    Birth control/protection: Surgical  Other Topics Concern   Not on file  Social History Narrative   Works for [employer]; never smoked; no alcohol; 2 children [25 and 31y- at 2022]. Lives with husband.    Social Determinants of Health   Financial Resource Strain: Low Risk    Difficulty of Paying Living Expenses: Not very hard  Food Insecurity: No Food Insecurity   Worried About Charity fundraiser in the Last Year: Never true   Ran Out of Food in the Last Year: Never true  Transportation Needs: No Transportation Needs   Lack of Transportation (Medical): No   Lack of Transportation (Non-Medical): No  Physical Activity: Inactive   Days of Exercise per Week: 0 days   Minutes of Exercise per Session: 0 min  Stress: Stress Concern Present   Feeling of Stress : To some extent  Social Connections: Socially Integrated   Frequency of Communication with Friends and Family: Three times a week   Frequency of Social Gatherings with Friends and Family: Three times a week   Attends Religious Services: More than 4 times per year   Active Member of Clubs or Organizations:  Yes   Attends Archivist Meetings: 1 to 4 times per year   Marital Status: Married  Human resources officer Violence: Unknown   Fear of Current  or Ex-Partner: Patient refused   Emotionally Abused: Patient refused   Physically Abused: Patient refused   Sexually Abused: Patient refused    Family History: Family History  Problem Relation Age of Onset   Congestive Heart Failure Mother    Diabetes Mother    Hypertension Mother    Diabetes Maternal Grandmother    Congestive Heart Failure Maternal Grandfather    Breast cancer Neg Hx     Review of Systems: ROS Denies any chest pain, difficulty breathing, leg swelling, or fevers.  Physical Exam: Vital Signs BP 139/82 (BP Location: Left Arm, Patient Position: Sitting, Cuff Size: Large)   Pulse 88   Ht 5' (1.524 m)   Wt 180 lb (81.6 kg)   LMP 11/15/2014   SpO2 98%   BMI 35.15 kg/m   Physical Exam Constitutional:      General: Not in acute distress.    Appearance: Normal appearance. Not ill-appearing.  HENT:     Head: Normocephalic and atraumatic.  Eyes:     Pupils: Pupils are equal, round. Cardiovascular:     Rate and Rhythm: Normal rate.    Pulses: Normal pulses.  Pulmonary:     Effort: No respiratory distress or increased work of breathing.  Speaks in full sentences. Abdominal:     General: Abdomen is flat. No distension.   Musculoskeletal: Normal range of motion. No lower extremity swelling or edema. No varicosities. Skin:    General: Skin is warm and dry.     Findings: No erythema or rash.  Neurological:     Mental Status: Alert and oriented to person, place, and time.  Psychiatric:        Mood and Affect: Mood normal.        Behavior: Behavior normal.    Assessment/Plan: The patient is scheduled for bilateral implant exchange with Dr. Claudia Desanctis.  Risks, benefits, and alternatives of procedure discussed, questions answered and consent obtained.    Smoking Status: Non-smoker. Last Mammogram: S/p double  mastectomy with immediate reconstruction  Caprini Score: 6; Risk Factors include: Age, BMI greater than 25, breast cancer, and length of planned surgery. Recommendation for mechanical prophylaxis. Encourage early ambulation.   Pictures obtained: 08/01/2021  Post-op Rx sent to pharmacy: Oxycodone, Zofran.  Patient was provided with the General Surgical Risk consent document and Pain Medication Agreement prior to their appointment.  They had adequate time to read through the risk consent documents and Pain Medication Agreement. We also discussed them in person together during this preop appointment. All of their questions were answered to their satisfaction.  Recommended calling if they have any further questions.  Risk consent form and Pain Medication Agreement to be scanned into patient's chart.  The risks that can be encountered with and after placement of a breast implant placement were discussed and include the following but not limited to these: bleeding, infection, delayed healing, anesthesia risks, skin sensation changes, injury to structures including nerves, blood vessels, and muscles which may be temporary or permanent, allergies to tape, suture materials and glues, blood products, topical preparations or injected agents, skin contour irregularities, skin discoloration and swelling, deep vein thrombosis, cardiac and pulmonary complications, pain, which may persist, fluid accumulation, wrinkling of the skin over the implant, changes in nipple or breast sensation, implant leakage or rupture, faulty position of the implant, persistent pain, formation of tight scar tissue around the implant (capsular contracture), possible need for revisional surgery or staged procedures.    Electronically signed by: Krista Blue, PA-C 09/17/2021 2:35  PM

## 2021-09-17 NOTE — Progress Notes (Signed)
? ?  Patient ID: Tracey Huff, female    DOB: Dec 16, 1970, 51 y.o.   MRN: [id number] ? ?Chief Complaint  ?Patient presents with  ? Pre-op Exam  ? ? ?  ICD-10-CM   ?1. Carcinoma of upper-outer quadrant of left breast in female, estrogen receptor positive ([place])  C50.412   ? Z17.0   ?  ? ? ? ?History of Present Illness: ?Tracey Huff is a 51 y.o.  female  with a history of left-sided breast cancer s/p reconstruction using tissue expanders and Flex HD 05/15/2021 by Dr. Claudia Desanctis.  She presents for preoperative evaluation for upcoming procedure, bilateral implant exchange, scheduled for 10/02/2021 with Dr. Claudia Desanctis. ? ?The patient has not had problems with anesthesia.  No history of CVA, MI, or clot.  No family history of clots or clotting disorders.  Patient confirms that she would like to be a full C or small D cup.  Confirms that she is pleased with current size.  Plan to hold her tamoxifen 2 weeks before and after surgery.  Will confirm with oncology.  Hold vitamins and supplements 5 days before surgery.  Hold any NSAIDs 72 hours before surgery.  She does not take aspirin or other blood thinners. ? ?Summary of Previous Visit: Patient was last seen here in clinic 08/15/2021.  She confirms that she was a 72 B/C cup prior to her mastectomy and reconstruction.  She is currently 490/450 cc each side.  She felt comfortable with current size of expanders.  On exam, skin was taut.  Decision was made to avoid any further expander fills.  Plan was for proceeding with second phase of her reconstruction via implant exchange. ? ?Job: Chartered certified accountant. ? ?PMH Significant for: Left-sided breast cancer, HTN, asthma. ? ? ?Past Medical History: ?Allergies: ?Allergies  ?Allergen Reactions  ? Macadamia Nut Oil Shortness Of Breath  ? Apple Juice Nausea And Vomiting  ? [place] Nut (Berthollefia [place]) Skin Test Nausea And Vomiting and Swelling  ?  Tongue swelling  ? Carrot Oil   ?  Can not cooked raw carrots  ? Fruit & Vegetable Daily [Nutritional  Supplements] Nausea And Vomiting  ?  Cannot tolerate apples, peaches, plums, and nectarines  ? Kiwi Extract Nausea And Vomiting  ? Peach [Prunus Persica] Nausea And Vomiting  ? ? ?Current Medications: ? ?Current Outpatient Medications:  ?  albuterol (PROVENTIL HFA) 108 (90 Base) MCG/ACT inhaler, Inhale 1 to 2 puffs every 4-6 hours as needed for cough/wheeze, Disp: 18 g, Rfl: 0 ?  albuterol (PROVENTIL HFA) 108 (90 Base) MCG/ACT inhaler, Inhale 1-2 puffs into the lungs every 4-6 hours as needed for cough/wheeze, Disp: 18 g, Rfl: 0 ?  Ascorbic Acid (VITAMIN C) 100 MG tablet, Take 100 mg by mouth daily., Disp: , Rfl:  ?  atorvastatin (LIPITOR) 20 MG tablet, Take 1 tablet (20 mg total) by mouth at bedtime. (Patient taking differently: Take 10 mg by mouth every other day. Bedtime), Disp: 90 tablet, Rfl: 3 ?  AUVI-Q 0.3 MG/0.3ML SOAJ injection, Inject 0.3 mg into the muscle as needed for anaphylaxis., Disp: , Rfl:  ?  azelastine (ASTELIN) 0.1 % nasal spray, Place 1-2 sprays into both nostrils 2 (two) times daily. (Patient taking differently: Place 1-2 sprays into both nostrils daily as needed for rhinitis or allergies.), Disp: 30 mL, Rfl: 6 ?  busPIRone (BUSPAR) 7.5 MG tablet, Take 1 tablet once daily at night; may take 1 additional tablet daily if needed for anxiety. (Patient taking differently: Take  7.5 mg by mouth as needed. Take 1 tablet once daily at night; may take 1 additional tablet daily if needed for anxiety.), Disp: 180 tablet, Rfl: 1 ?  Cholecalciferol 25 MCG (1000 UT) capsule, Take 1,000 Units by mouth daily., Disp: , Rfl:  ?  diphenhydrAMINE (BENADRYL) 2 % cream, Apply 1 application topically 2 (two) times daily as needed for itching., Disp: , Rfl:  ?  ELDERBERRY PO, Take 1 capsule by mouth daily., Disp: , Rfl:  ?  fluticasone (FLONASE) 50 MCG/ACT nasal spray, Place 1-2 sprays into both nostrils daily. (Patient taking differently: Place 1-2 sprays into both nostrils daily as needed for allergies or  rhinitis.), Disp: 16 g, Rfl: 6 ?  Fluticasone-Umeclidin-Vilant (TRELEGY ELLIPTA) 200-62.5-25 MCG/ACT AEPB, Inhale 1 puff into the lungs daily. (Patient taking differently: Inhale 1 puff into the lungs every morning.), Disp: 60 each, Rfl: 6 ?  Fluticasone-Umeclidin-Vilant (TRELEGY ELLIPTA) 200-62.5-25 MCG/ACT AEPB, Inhale 1 puff into the lungs daily., Disp: 60 each, Rfl: 6 ?  HYDROcodone-acetaminophen (NORCO/VICODIN) 5-325 MG tablet, Take 1 tablet by mouth every 6 (six) hours as needed for moderate pain., Disp: , Rfl:  ?  hydrocortisone cream 1 %, Apply 1 application topically daily as needed for itching., Disp: , Rfl:  ?  hydrOXYzine (VISTARIL) 25 MG capsule, TAKE 1 CAPSULE (25 MG TOTAL) BY MOUTH AT BEDTIME AS NEEDED FOR UP TO 30 DOSES (DIFFICULTY SLEEPING)., Disp: 30 capsule, Rfl: 1 ?  ibuprofen (ADVIL) 200 MG tablet, Take 400 mg by mouth every 6 (six) hours as needed for moderate pain., Disp: , Rfl:  ?  ipratropium-albuterol (DUONEB) 0.5-2.5 (3) MG/3ML SOLN, Take 3 mLs by nebulization every 6 (six) hours as needed (wheeze, SOB, cough variant asthma)., Disp: 360 mL, Rfl: 1 ?  levocetirizine (XYZAL) 5 MG tablet, Take 1 tablet (5 mg total) by mouth every evening., Disp: 30 tablet, Rfl: 6 ?  levocetirizine (XYZAL) 5 MG tablet, Take 1 tablet (5 mg total) by mouth every evening., Disp: 30 tablet, Rfl: 6 ?  losartan (COZAAR) 25 MG tablet, Take 1 tablet (25 mg total) by mouth every morning., Disp: 90 tablet, Rfl: 1 ?  methocarbamol (ROBAXIN) 500 MG tablet, Take 1 tablet (500 mg total) by mouth 2 (two) times daily as needed for muscle spasms for up to 10 days, Disp: 20 tablet, Rfl: 0 ?  montelukast (SINGULAIR) 10 MG tablet, Take 1 tablet (10 mg total) by mouth every evening., Disp: 30 tablet, Rfl: 6 ?  montelukast (SINGULAIR) 10 MG tablet, Take 1 tablet (10 mg total) by mouth daily., Disp: 30 tablet, Rfl: 6 ?  Olopatadine HCl (PATADAY) 0.2 % SOLN, Place 1 drop into affected eye daily. (Patient taking differently: Place 1  drop into both eyes daily as needed (itching).), Disp: 2.5 mL, Rfl: 6 ?  pregabalin (LYRICA) 50 MG capsule, Take 1 capsule (50 mg total) by mouth 3 (three) times daily., Disp: 30 capsule, Rfl: 0 ?  tamoxifen (NOLVADEX) 20 MG tablet, Take 1 tablet (20 mg total) by mouth daily., Disp: 90 tablet, Rfl: 1 ? ?Past Medical Problems: ?Past Medical History:  ?Diagnosis Date  ? Allergy   ? Asthma   ? Breast cancer ([place])   ? INVASIVE MAMMARY CARCINOMA- left  ? Family history of prostate cancer   ? Fibroid uterus 11/15/2014  ? Headache   ? Hyperlipidemia   ? Hypertension   ? Menorrhagia with irregular cycle 11/15/2014  ? Pneumonia   ? Pre-diabetes   ? Shortness of breath dyspnea   ? ? ?  Past Surgical History: ?Past Surgical History:  ?Procedure Laterality Date  ? ABDOMINAL HYSTERECTOMY N/A 11/15/2014  ? Procedure: HYSTERECTOMY ABDOMINAL/BILATERAL SALPINGECTOMY ;  Surgeon: Will Bonnet, MD;  Location: [facility];  Service: Gynecology;  Laterality: N/A;  ? BREAST BIOPSY Right   ? 2019 negative  ? BREAST BIOPSY Left 03/09/2021  ? u/s biopsy, 12-1 o'clock, VENUS" clip-path pending  ? BREAST LUMPECTOMY,RADIO FREQ LOCALIZER,AXILLARY SENTINEL LYMPH NODE BIOPSY Left 03/28/2021  ? Procedure: BREAST LUMPECTOMY,RADIO FREQ LOCALIZER,AXILLARY SENTINEL LYMPH NODE BIOPSY;  Surgeon: Jules Husbands, MD;  Location: [facility];  Service: General;  Laterality: Left;  ? BREAST RECONSTRUCTION WITH PLACEMENT OF TISSUE EXPANDER AND FLEX HD (ACELLULAR HYDRATED DERMIS) Bilateral 05/15/2021  ? Procedure: BILATERAL BREAST RECONSTRUCTION WITH PLACEMENT OF TISSUE EXPANDER AND FLEX HD (ACELLULAR HYDRATED DERMIS);  Surgeon: Cindra Presume, MD;  Location: [facility];  Service: Plastics;  Laterality: Bilateral;  ? [place]  ? COLPOSCOPY  07/28/2018  ? CYSTOSCOPY N/A 11/15/2014  ? Procedure: CYSTOSCOPY;  Surgeon: Will Bonnet, MD;  Location: [facility];  Service: Gynecology;  Laterality: N/A;  ? DILATION AND CURETTAGE OF UTERUS    ? GANGLION CYST  EXCISION Left   ? TOTAL MASTECTOMY Bilateral 05/15/2021  ? Procedure: TOTAL MASTECTOMY, Bilateral Simple Mastectomy, skin sparing;  Surgeon: Jules Husbands, MD;  Location: [facility];  Service: General;  Laterality:

## 2021-09-17 NOTE — Patient Instructions (Signed)
If you have any concerns or questions, please feel free to call our office. Follow up in 6 months.  ? ?Breast Self-Awareness ?Breast self-awareness is knowing how your breasts look and feel. You need to: ?Check your breasts on a regular basis. ?Tell your doctor about any changes. ?Become familiar with the look and feel of your breasts. This can help you catch a breast problem while it is still small and can be treated. You should do breast self-exams even if you have breast implants. ?What you need: ?A mirror. ?A well-lit room. ?A pillow or other soft object. ?How to do a breast self-exam ?Follow these steps to do a breast self-exam: ?Look for changes ? ?Take off all the clothes above your waist. ?Stand in front of a mirror in a room with good lighting. ?Put your hands down at your sides. ?Compare your breasts in the mirror. Look for any difference between them, such as: ?A difference in shape. ?A difference in size. ?Wrinkles, dips, and bumps in one breast and not the other. ?Look at each breast for changes in the skin, such as: ?Redness. ?Scaly areas. ?Skin that has gotten thicker. ?Dimpling. ?Open sores (ulcers). ?Look for changes in your nipples, such as: ?Fluid coming out of a nipple. ?Fluid around a nipple. ?Bleeding. ?Dimpling. ?Redness. ?A nipple that looks pushed in (retracted), or that has changed position. ?Feel for changes ?Lie on your back. ?Feel each breast. To do this: ?Pick a breast to feel. ?Place a pillow under the shoulder closest to that breast. Put the arm closest to that breast behind your head. ?Feel the nipple area of that breast using the hand of your other arm. Feel the area with the pads of your three middle fingers by making small circles with your fingers. Use light, medium, and firm pressure. ?Continue the overlapping circles, moving downward over the breast. Keep making circles with your fingers. Stop when you feel your ribs. ?Start making circles with your fingers again, this time  going upward until you reach your collarbone. ?Then, make circles outward across your breast and into your armpit area. ?Squeeze your nipple. Check for discharge and lumps. ?Repeat these steps to check your other breast. ?Sit or stand in the tub or shower. ?With soapy water on your skin, feel each breast the same way you did when you were lying down. ?Write down what you find ?Writing down what you find can help you remember what to tell your doctor. Write down: ?What is normal for each breast. ?Any changes you find in each breast. These include: ?The kind of changes you find. ?A tender or painful breast. ?Any lump you find. Write down its size and where it is. ?When you last had your monthly period (menstrual cycle). ?General tips ?If you are breastfeeding, the best time to check your breasts is after you feed your baby or after you use a breast pump. ?If you get monthly bleeding, the best time to check your breasts is 5-7 days after your monthly cycle ends. ?With time, you will become comfortable with the self-exam. You will also start to know if there are changes in your breasts. ?Contact a doctor if: ?You see a change in the shape or size of your breasts or nipples. ?You see a change in the skin of your breast or nipples, such as red or scaly skin. ?You have fluid coming from your nipples that is not normal. ?You find a new lump or thick area. ?You have breast pain. ?You  have any concerns about your breast health. ?Summary ?Breast self-awareness includes looking for changes in your breasts and feeling for changes within your breasts. ?You should do breast self-awareness in front of a mirror in a well-lit room. ?If you get monthly periods (menstrual cycles), the best time to check your breasts is 5-7 days after your period ends. ?Tell your doctor about any changes you see in your breasts. Changes include changes in size, changes on the skin, painful or tender breasts, or fluid from your nipples that is not  normal. ?This information is not intended to replace advice given to you by your health care provider. Make sure you discuss any questions you have with your health care provider. ?Document Revised: 02/22/2021 Document Reviewed: 02/22/2021 ?Elsevier Patient Education ? [place]. ? ?

## 2021-09-18 ENCOUNTER — Telehealth: Payer: Self-pay

## 2021-09-18 NOTE — Telephone Encounter (Signed)
Faxed surgical clearance to Dr. Rogue Bussing to medically clear patient off tamoxifen 2 weeks prior to surgery and 2 weeks after surgery ?

## 2021-09-18 NOTE — Progress Notes (Signed)
Outpatient Surgical Follow Up ? ?09/18/2021 ? ?Tracey Huff is an 51 y.o. female.  ? ?Chief Complaint  ?Patient presents with  ? Follow-up  ? ? ?HPI: Tracey Huff is following up after bilateral mastectomies and recon on 05/15/21  ?She is doing very welll.  No complaints  ?No new masses, she is on the schedule for placement of implants. ? No new issues. Feels better and continued to improve ? ?Past Medical History:  ?Diagnosis Date  ? Allergy   ? Asthma   ? Breast cancer ([place])   ? INVASIVE MAMMARY CARCINOMA- left  ? Family history of prostate cancer   ? Fibroid uterus 11/15/2014  ? Headache   ? Hyperlipidemia   ? Hypertension   ? Menorrhagia with irregular cycle 11/15/2014  ? Pneumonia   ? Pre-diabetes   ? Shortness of breath dyspnea   ? ? ?Past Surgical History:  ?Procedure Laterality Date  ? ABDOMINAL HYSTERECTOMY N/A 11/15/2014  ? Procedure: HYSTERECTOMY ABDOMINAL/BILATERAL SALPINGECTOMY ;  Surgeon: Will Bonnet, MD;  Location: [facility];  Service: Gynecology;  Laterality: N/A;  ? BREAST BIOPSY Right   ? 2019 negative  ? BREAST BIOPSY Left 03/09/2021  ? u/s biopsy, 12-1 o'clock, VENUS" clip-path pending  ? BREAST LUMPECTOMY,RADIO FREQ LOCALIZER,AXILLARY SENTINEL LYMPH NODE BIOPSY Left 03/28/2021  ? Procedure: BREAST LUMPECTOMY,RADIO FREQ LOCALIZER,AXILLARY SENTINEL LYMPH NODE BIOPSY;  Surgeon: Jules Husbands, MD;  Location: [facility];  Service: General;  Laterality: Left;  ? BREAST RECONSTRUCTION WITH PLACEMENT OF TISSUE EXPANDER AND FLEX HD (ACELLULAR HYDRATED DERMIS) Bilateral 05/15/2021  ? Procedure: BILATERAL BREAST RECONSTRUCTION WITH PLACEMENT OF TISSUE EXPANDER AND FLEX HD (ACELLULAR HYDRATED DERMIS);  Surgeon: Cindra Presume, MD;  Location: [facility];  Service: Plastics;  Laterality: Bilateral;  ? [place]  ? COLPOSCOPY  07/28/2018  ? CYSTOSCOPY N/A 11/15/2014  ? Procedure: CYSTOSCOPY;  Surgeon: Will Bonnet, MD;  Location: [facility];  Service: Gynecology;  Laterality: N/A;  ? DILATION AND  CURETTAGE OF UTERUS    ? GANGLION CYST EXCISION Left   ? TOTAL MASTECTOMY Bilateral 05/15/2021  ? Procedure: TOTAL MASTECTOMY, Bilateral Simple Mastectomy, skin sparing;  Surgeon: Jules Husbands, MD;  Location: [facility];  Service: General;  Laterality: Bilateral;  Provider requesting for 2 hours / 120 min for procedure.  ? UTERINE FIBROID SURGERY    ? ? ?Family History  ?Problem Relation Age of Onset  ? Congestive Heart Failure Mother   ? Diabetes Mother   ? Hypertension Mother   ? Diabetes Maternal Grandmother   ? Congestive Heart Failure Maternal Grandfather   ? Breast cancer Neg Hx   ? ? ?Social History:  reports that she has never smoked. She has never been exposed to tobacco smoke. She has never used smokeless tobacco. She reports that she does not currently use alcohol. She reports that she does not use drugs. ? ?Allergies:  ?Allergies  ?Allergen Reactions  ? Macadamia Nut Oil Shortness Of Breath  ? Apple Juice Nausea And Vomiting  ? [place] Nut (Berthollefia [place]) Skin Test Nausea And Vomiting and Swelling  ?  Tongue swelling  ? Carrot Oil   ?  Can not cooked raw carrots  ? Fruit & Vegetable Daily [Nutritional Supplements] Nausea And Vomiting  ?  Cannot tolerate apples, peaches, plums, and nectarines  ? Kiwi Extract Nausea And Vomiting  ? Peach [Prunus Persica] Nausea And Vomiting  ? ? ?Medications reviewed. ? ? ? ?ROS ?Full ROS performed and is otherwise negative  other than what is stated in HPI ? ? ?BP (!) 143/87   Pulse 89   Temp 98.6 ?F (37 ?C)   Ht 5' (1.524 m)   Wt 180 lb (81.6 kg)   LMP 11/15/2014   SpO2 98%   BMI 35.15 kg/m?  ? ?Physical Exam ?NAD Alert ?Breast: expanders in place, no evidence of infection, no masses, skin healed. ?Chest: CTA , NSR, No resp issues. ?Abd: soft, nt, no masses ?Ext: well perfused, no Lymphedema ? ? ?Assessment/Plan: ?1. Malignant neoplasm of overlapping sites of left breast in female, estrogen receptor positive ([place]) ?No evidence of recurrence. No complications.   ?RTC 6 months for PE. I spent 20 minutes in this encounter including counseling the patient, coordinating her care placing orders and performing appropriate documentation ? ? ? ?Caroleen Hamman, MD FACS ?General Surgeon  ?

## 2021-09-19 ENCOUNTER — Telehealth: Payer: Self-pay

## 2021-09-19 ENCOUNTER — Telehealth: Payer: Self-pay | Admitting: Plastic Surgery

## 2021-09-19 NOTE — Telephone Encounter (Signed)
Outbound call placed to inform patient FMLA paperwork completed and faxed to [phone]; confirmation received. Left voicemail message to ask if patient wants a copy of the paperwork for her records. Requested call back.  ?

## 2021-09-19 NOTE — Telephone Encounter (Signed)
Called, spoke with the patient and advised to stop taking the Tamoxifen today and can start back 2 weeks after surgery.  Patient understood and agreed with plan. ?

## 2021-09-20 DIAGNOSIS — J3081 Allergic rhinitis due to animal (cat) (dog) hair and dander: Secondary | ICD-10-CM | POA: Diagnosis not present

## 2021-09-20 DIAGNOSIS — J301 Allergic rhinitis due to pollen: Secondary | ICD-10-CM | POA: Diagnosis not present

## 2021-09-20 DIAGNOSIS — J3089 Other allergic rhinitis: Secondary | ICD-10-CM | POA: Diagnosis not present

## 2021-09-21 ENCOUNTER — Other Ambulatory Visit: Payer: Self-pay | Admitting: Physician Assistant

## 2021-09-21 ENCOUNTER — Other Ambulatory Visit: Payer: Self-pay

## 2021-09-21 ENCOUNTER — Encounter (HOSPITAL_BASED_OUTPATIENT_CLINIC_OR_DEPARTMENT_OTHER): Payer: Self-pay | Admitting: Plastic Surgery

## 2021-09-28 DIAGNOSIS — J301 Allergic rhinitis due to pollen: Secondary | ICD-10-CM | POA: Diagnosis not present

## 2021-09-28 DIAGNOSIS — J3081 Allergic rhinitis due to animal (cat) (dog) hair and dander: Secondary | ICD-10-CM | POA: Diagnosis not present

## 2021-09-28 DIAGNOSIS — J3089 Other allergic rhinitis: Secondary | ICD-10-CM | POA: Diagnosis not present

## 2021-10-02 ENCOUNTER — Other Ambulatory Visit: Payer: Self-pay

## 2021-10-02 ENCOUNTER — Ambulatory Visit (HOSPITAL_BASED_OUTPATIENT_CLINIC_OR_DEPARTMENT_OTHER)
Admission: RE | Admit: 2021-10-02 | Discharge: 2021-10-02 | Disposition: A | Discharge status: Home / Self Care | Payer: 59 | Source: Ambulatory Visit | Attending: Plastic Surgery | Admitting: Plastic Surgery

## 2021-10-02 ENCOUNTER — Ambulatory Visit (HOSPITAL_BASED_OUTPATIENT_CLINIC_OR_DEPARTMENT_OTHER): Payer: 59 | Admitting: Certified Registered"

## 2021-10-02 ENCOUNTER — Encounter (HOSPITAL_BASED_OUTPATIENT_CLINIC_OR_DEPARTMENT_OTHER): Payer: Self-pay | Admitting: Plastic Surgery

## 2021-10-02 ENCOUNTER — Encounter (HOSPITAL_BASED_OUTPATIENT_CLINIC_OR_DEPARTMENT_OTHER)
Admission: RE | Disposition: A | Discharge status: Home / Self Care | Payer: Self-pay | Source: Ambulatory Visit | Attending: Plastic Surgery

## 2021-10-02 DIAGNOSIS — R7303 Prediabetes: Secondary | ICD-10-CM | POA: Insufficient documentation

## 2021-10-02 DIAGNOSIS — Z853 Personal history of malignant neoplasm of breast: Secondary | ICD-10-CM | POA: Insufficient documentation

## 2021-10-02 DIAGNOSIS — D0512 Intraductal carcinoma in situ of left breast: Secondary | ICD-10-CM

## 2021-10-02 DIAGNOSIS — F419 Anxiety disorder, unspecified: Secondary | ICD-10-CM | POA: Diagnosis not present

## 2021-10-02 DIAGNOSIS — Z45812 Encounter for adjustment or removal of left breast implant: Secondary | ICD-10-CM | POA: Diagnosis not present

## 2021-10-02 DIAGNOSIS — Z170 Estrogen receptor positive status [ER+]: Secondary | ICD-10-CM

## 2021-10-02 DIAGNOSIS — I10 Essential (primary) hypertension: Secondary | ICD-10-CM | POA: Insufficient documentation

## 2021-10-02 DIAGNOSIS — D241 Benign neoplasm of right breast: Secondary | ICD-10-CM

## 2021-10-02 DIAGNOSIS — C50919 Malignant neoplasm of unspecified site of unspecified female breast: Secondary | ICD-10-CM

## 2021-10-02 DIAGNOSIS — J45909 Unspecified asthma, uncomplicated: Secondary | ICD-10-CM | POA: Insufficient documentation

## 2021-10-02 DIAGNOSIS — Z45811 Encounter for adjustment or removal of right breast implant: Secondary | ICD-10-CM | POA: Diagnosis not present

## 2021-10-02 DIAGNOSIS — Z421 Encounter for breast reconstruction following mastectomy: Secondary | ICD-10-CM | POA: Diagnosis not present

## 2021-10-02 HISTORY — DX: Anxiety disorder, unspecified: F41.9

## 2021-10-02 HISTORY — PX: REMOVAL OF BILATERAL TISSUE EXPANDERS WITH PLACEMENT OF BILATERAL BREAST IMPLANTS: SHX6431

## 2021-10-02 SURGERY — REMOVAL, TISSUE EXPANDER, BREAST, BILATERAL, WITH BILATERAL IMPLANT IMPLANT INSERTION
Anesthesia: General | Site: Breast | Laterality: Bilateral

## 2021-10-02 MED ORDER — FENTANYL CITRATE (PF) 100 MCG/2ML IJ SOLN
INTRAMUSCULAR | Status: AC
  Filled 2021-10-02: qty 2

## 2021-10-02 MED ORDER — CEFAZOLIN SODIUM-DEXTROSE 2-4 GM/100ML-% IV SOLN
2.0000 g | INTRAVENOUS | Status: AC
  Administered 2021-10-02: 2 g via INTRAVENOUS

## 2021-10-02 MED ORDER — ROCURONIUM BROMIDE 100 MG/10ML IV SOLN
INTRAVENOUS | Status: DC | PRN
  Administered 2021-10-02: 70 mg via INTRAVENOUS

## 2021-10-02 MED ORDER — SUGAMMADEX SODIUM 200 MG/2ML IV SOLN
INTRAVENOUS | Status: DC | PRN
  Administered 2021-10-02: 200 mg via INTRAVENOUS

## 2021-10-02 MED ORDER — LIDOCAINE 2% (20 MG/ML) 5 ML SYRINGE
INTRAMUSCULAR | Status: AC
  Filled 2021-10-02: qty 5

## 2021-10-02 MED ORDER — LIDOCAINE HCL (CARDIAC) PF 100 MG/5ML IV SOSY
PREFILLED_SYRINGE | INTRAVENOUS | Status: DC | PRN
  Administered 2021-10-02: 60 mg via INTRAVENOUS

## 2021-10-02 MED ORDER — DEXAMETHASONE SODIUM PHOSPHATE 10 MG/ML IJ SOLN
INTRAMUSCULAR | Status: AC
  Filled 2021-10-02: qty 1

## 2021-10-02 MED ORDER — ROCURONIUM BROMIDE 10 MG/ML (PF) SYRINGE
PREFILLED_SYRINGE | INTRAVENOUS | Status: AC
  Filled 2021-10-02: qty 10

## 2021-10-02 MED ORDER — OXYCODONE HCL 5 MG PO TABS
5.0000 mg | ORAL_TABLET | Freq: Once | ORAL | Status: AC | PRN
  Administered 2021-10-02: 5 mg via ORAL

## 2021-10-02 MED ORDER — MIDAZOLAM HCL 5 MG/5ML IJ SOLN
INTRAMUSCULAR | Status: DC | PRN
  Administered 2021-10-02: 2 mg via INTRAVENOUS

## 2021-10-02 MED ORDER — SODIUM CHLORIDE 0.9 % IV SOLN
INTRAVENOUS | Status: DC | PRN
  Administered 2021-10-02: 500 mL

## 2021-10-02 MED ORDER — DEXAMETHASONE SODIUM PHOSPHATE 4 MG/ML IJ SOLN
INTRAMUSCULAR | Status: DC | PRN
  Administered 2021-10-02: 8 mg via INTRAVENOUS

## 2021-10-02 MED ORDER — ONDANSETRON HCL 4 MG/2ML IJ SOLN: 4.0000 mg | Freq: Four times a day (QID) | INTRAMUSCULAR | Status: DC | PRN

## 2021-10-02 MED ORDER — CHLORHEXIDINE GLUCONATE CLOTH 2 % EX PADS: 6.0000 | MEDICATED_PAD | Freq: Once | CUTANEOUS | Status: DC

## 2021-10-02 MED ORDER — OXYCODONE HCL 5 MG/5ML PO SOLN: 5.0000 mg | Freq: Once | ORAL | Status: AC | PRN

## 2021-10-02 MED ORDER — BUPIVACAINE-EPINEPHRINE (PF) 0.25% -1:200000 IJ SOLN
INTRAMUSCULAR | Status: AC
  Filled 2021-10-02: qty 30

## 2021-10-02 MED ORDER — BUPIVACAINE-EPINEPHRINE (PF) 0.25% -1:200000 IJ SOLN
INTRAMUSCULAR | Status: DC | PRN
  Administered 2021-10-02: 20 mL via PERINEURAL

## 2021-10-02 MED ORDER — ONDANSETRON HCL 4 MG/2ML IJ SOLN
INTRAMUSCULAR | Status: AC
  Filled 2021-10-02: qty 2

## 2021-10-02 MED ORDER — OXYCODONE HCL 5 MG PO TABS
ORAL_TABLET | ORAL | Status: AC
  Filled 2021-10-02: qty 1

## 2021-10-02 MED ORDER — PROPOFOL 10 MG/ML IV BOLUS
INTRAVENOUS | Status: DC | PRN
  Administered 2021-10-02: 180 mg via INTRAVENOUS

## 2021-10-02 MED ORDER — MIDAZOLAM HCL 2 MG/2ML IJ SOLN
INTRAMUSCULAR | Status: AC
  Filled 2021-10-02: qty 2

## 2021-10-02 MED ORDER — LACTATED RINGERS IV SOLN: INTRAVENOUS | Status: DC

## 2021-10-02 MED ORDER — FENTANYL CITRATE (PF) 100 MCG/2ML IJ SOLN
25.0000 ug | INTRAMUSCULAR | Status: DC | PRN
  Administered 2021-10-02 (×2): 50 ug via INTRAVENOUS

## 2021-10-02 MED ORDER — CEFAZOLIN SODIUM 1 G IJ SOLR
INTRAMUSCULAR | Status: AC
  Filled 2021-10-02: qty 20

## 2021-10-02 MED ORDER — PROPOFOL 10 MG/ML IV BOLUS
INTRAVENOUS | Status: AC
  Filled 2021-10-02: qty 20

## 2021-10-02 MED ORDER — ONDANSETRON HCL 4 MG/2ML IJ SOLN
INTRAMUSCULAR | Status: DC | PRN
  Administered 2021-10-02: 4 mg via INTRAVENOUS

## 2021-10-02 MED ORDER — FENTANYL CITRATE (PF) 100 MCG/2ML IJ SOLN
INTRAMUSCULAR | Status: DC | PRN
  Administered 2021-10-02: 100 ug via INTRAVENOUS

## 2021-10-02 SURGICAL SUPPLY — 58 items
APL PRP STRL LF DISP 70% ISPRP (MISCELLANEOUS) ×1
BAG DECANTER FOR FLEXI CONT (MISCELLANEOUS) ×2 IMPLANT
BLADE SURG 10 STRL SS (BLADE) IMPLANT
BLADE SURG 15 STRL LF DISP TIS (BLADE) ×1 IMPLANT
BLADE SURG 15 STRL SS (BLADE) ×2
BNDG CMPR MED 10X6 ELC LF (GAUZE/BANDAGES/DRESSINGS) ×1
BNDG ELASTIC 6X10 VLCR STRL LF (GAUZE/BANDAGES/DRESSINGS) ×2 IMPLANT
CANISTER SUCT 1200ML W/VALVE (MISCELLANEOUS) ×2 IMPLANT
CHLORAPREP W/TINT 26 (MISCELLANEOUS) ×2 IMPLANT
COVER BACK TABLE 60X90IN (DRAPES) ×2 IMPLANT
COVER MAYO STAND STRL (DRAPES) ×2 IMPLANT
DRAIN CHANNEL 15F RND FF W/TCR (WOUND CARE) IMPLANT
DRAPE LAPAROSCOPIC ABDOMINAL (DRAPES) ×2 IMPLANT
DRAPE UTILITY XL STRL (DRAPES) ×2 IMPLANT
DRSG PAD ABDOMINAL 8X10 ST (GAUZE/BANDAGES/DRESSINGS) ×4 IMPLANT
ELECT BLADE 4.0 EZ CLEAN MEGAD (MISCELLANEOUS)
ELECT COATED BLADE 2.86 ST (ELECTRODE) IMPLANT
ELECT REM PT RETURN 9FT ADLT (ELECTROSURGICAL) ×2
ELECTRODE BLDE 4.0 EZ CLN MEGD (MISCELLANEOUS) IMPLANT
ELECTRODE REM PT RTRN 9FT ADLT (ELECTROSURGICAL) ×1 IMPLANT
EVACUATOR SILICONE 100CC (DRAIN) IMPLANT
FUNNEL KELLER 2 DISP (MISCELLANEOUS) ×2 IMPLANT
GAUZE SPONGE 4X4 12PLY STRL (GAUZE/BANDAGES/DRESSINGS) ×2 IMPLANT
GLOVE BIOGEL M STRL SZ7.5 (GLOVE) ×2 IMPLANT
GLOVE BIOGEL PI IND STRL 8 (GLOVE) ×1 IMPLANT
GLOVE BIOGEL PI INDICATOR 8 (GLOVE) ×1
GOWN STRL REUS W/ TWL LRG LVL3 (GOWN DISPOSABLE) ×2 IMPLANT
GOWN STRL REUS W/TWL LRG LVL3 (GOWN DISPOSABLE) ×4
IMPL BREAST SILICONE 560CC (Breast) IMPLANT
IMPLANT BREAST SILICONE 560CC (Breast) ×4 IMPLANT
IV NS 500ML (IV SOLUTION)
IV NS 500ML BAXH (IV SOLUTION) IMPLANT
KIT FILL ASEPTIC TRANSFER (MISCELLANEOUS) IMPLANT
MARKER SKIN DUAL TIP RULER LAB (MISCELLANEOUS) IMPLANT
NDL HYPO 25X1 1.5 SAFETY (NEEDLE) ×1 IMPLANT
NEEDLE HYPO 25X1 1.5 SAFETY (NEEDLE) ×2 IMPLANT
PACK BASIN DAY SURGERY FS (CUSTOM PROCEDURE TRAY) ×2 IMPLANT
PENCIL SMOKE EVACUATOR (MISCELLANEOUS) ×2 IMPLANT
PIN SAFETY STERILE (MISCELLANEOUS) IMPLANT
SIZER BREAST REUSE 560CC (SIZER) ×4
SIZER BRST REUSE P6.3XHI 560CC (SIZER) IMPLANT
SLEEVE SCD COMPRESS KNEE MED (STOCKING) ×2 IMPLANT
SPIKE FLUID TRANSFER (MISCELLANEOUS) IMPLANT
SPONGE T-LAP 18X18 ~~LOC~~+RFID (SPONGE) ×2 IMPLANT
STAPLER INSORB 30 2030 C-SECTI (MISCELLANEOUS) ×1 IMPLANT
STAPLER VISISTAT 35W (STAPLE) ×2 IMPLANT
STRIP SUTURE WOUND CLOSURE 1/2 (MISCELLANEOUS) ×4 IMPLANT
SUT ETHILON 2 0 FS 18 (SUTURE) IMPLANT
SUT PDS 3-0 CT2 (SUTURE) ×6
SUT PDS II 3-0 CT2 27 ABS (SUTURE) ×2 IMPLANT
SUT VLOC 180 0 24IN GS25 (SUTURE) IMPLANT
SUT VLOC 90 P-14 23 (SUTURE) ×3 IMPLANT
SYR BULB IRRIG 60ML STRL (SYRINGE) ×2 IMPLANT
SYR CONTROL 10ML LL (SYRINGE) ×2 IMPLANT
TOWEL GREEN STERILE FF (TOWEL DISPOSABLE) ×2 IMPLANT
TUBE CONNECTING 20X1/4 (TUBING) ×2 IMPLANT
UNDERPAD 30X36 HEAVY ABSORB (UNDERPADS AND DIAPERS) ×4 IMPLANT
YANKAUER SUCT BULB TIP NO VENT (SUCTIONS) ×2 IMPLANT

## 2021-10-02 NOTE — Anesthesia Postprocedure Evaluation (Signed)
Anesthesia Post Note  Patient: Tracey Huff  Procedure(s) Performed: REMOVAL OF BILATERAL TISSUE EXPANDERS WITH PLACEMENT OF BILATERAL BREAST IMPLANTS (Bilateral: Breast)     Patient location during evaluation: PACU Anesthesia Type: General Level of consciousness: awake Pain management: pain level controlled Vital Signs Assessment: post-procedure vital signs reviewed and stable Respiratory status: spontaneous breathing, nonlabored ventilation, respiratory function stable and patient connected to nasal cannula oxygen Cardiovascular status: blood pressure returned to baseline and stable Postop Assessment: no apparent nausea or vomiting Anesthetic complications: no   No notable events documented.  Last Vitals:  Vitals:   10/02/21 1615 10/02/21 1630  BP: 124/72 140/65  Pulse: 80 89  Resp: 20 20  Temp:  36.9 C  SpO2: 98% 96%    Last Pain:  Vitals:   10/02/21 1630  TempSrc: Oral  PainSc: 4                  Hendryx Ricke P Deniya Craigo

## 2021-10-02 NOTE — Discharge Instructions (Addendum)
Activity As tolerated. NO showers for 3 days. Keep Breast binder wrap on breasts until then. After showering, put breast wrap back on, this is important for compression. NO driving while in pain, taking pain medication or if you are unable to safely react to traffic. No heavy activities Take Pain medication as needed for severe pain. Otherwise, you can use ibuprofen or tylenol as needed. Avoid more than 3,000 mg of tylenol in 24 hours.  You do not need an antibiotic post-operatively unless this was discussed with the provider at your pre-op appointment.  Diet: Regular. Drink plenty of fluids and eat healthy (high protein, low carbs), Try to optimize your nutrition with plenty of fruits and vegetables to improve healing. Protein shakes are a good option.  Wound Care: Keep dressing clean & dry. You may change bandages after showering if you continue to notice some drainage. You can reuse bandages if they are not dirty/soiled. Mild wound drainage is common after breast reduction surgery and should not be cause for alarm.  Special Instructions: Call Doctor if any unusual problems occur such as pain, excessive Bleeding, unrelieved Nausea/vomiting, Fever &/or chills  Follow-up appointment: Previously scheduled.   Post Anesthesia Home Care Instructions  Activity: Get plenty of rest for the remainder of the day. A responsible individual must stay with you for 24 hours following the procedure.  For the next 24 hours, DO NOT: -Drive a car -Paediatric nurse -Drink alcoholic beverages -Take any medication unless instructed by your physician -Make any legal decisions or sign important papers.  Meals: Start with liquid foods such as gelatin or soup. Progress to regular foods as tolerated. Avoid greasy, spicy, heavy foods. If nausea and/or vomiting occur, drink only clear liquids until the nausea and/or vomiting subsides. Call your physician if vomiting continues.  Special  Instructions/Symptoms: Your throat may feel dry or sore from the anesthesia or the breathing tube placed in your throat during surgery. If this causes discomfort, gargle with warm salt water. The discomfort should disappear within 24 hours.  If you had a scopolamine patch placed behind your ear for the management of post- operative nausea and/or vomiting:  1. The medication in the patch is effective for 72 hours, after which it should be removed.  Wrap patch in a tissue and discard in the trash. Wash hands thoroughly with soap and water. 2. You may remove the patch earlier than 72 hours if you experience unpleasant side effects which may include dry mouth, dizziness or visual disturbances. 3. Avoid touching the patch. Wash your hands with soap and water after contact with the patch.     Oxycodone given at 4:30pm today

## 2021-10-02 NOTE — Anesthesia Preprocedure Evaluation (Signed)
Anesthesia Evaluation  Patient identified by MRN, date of birth, ID band Patient awake    Reviewed: Allergy & Precautions, H&P , NPO status , Patient's Chart, lab work & pertinent test results  Airway Mallampati: II   Neck ROM: full    Dental   Pulmonary asthma ,    breath sounds clear to auscultation       Cardiovascular hypertension,  Rhythm:regular Rate:Normal     Neuro/Psych PSYCHIATRIC DISORDERS Anxiety    GI/Hepatic   Endo/Other    Renal/GU      Musculoskeletal   Abdominal   Peds  Hematology   Anesthesia Other Findings   Reproductive/Obstetrics H/o breast CA                             Anesthesia Physical Anesthesia Plan  ASA: 2  Anesthesia Plan: General   Post-op Pain Management:    Induction: Intravenous  PONV Risk Score and Plan: 3 and Ondansetron, Dexamethasone, Midazolam and Treatment may vary due to age or medical condition  Airway Management Planned: Oral ETT  Additional Equipment:   Intra-op Plan:   Post-operative Plan: Extubation in OR  Informed Consent: I have reviewed the patients History and Physical, chart, labs and discussed the procedure including the risks, benefits and alternatives for the proposed anesthesia with the patient or authorized representative who has indicated his/her understanding and acceptance.     Dental advisory given  Plan Discussed with: CRNA, Anesthesiologist and Surgeon  Anesthesia Plan Comments:         Anesthesia Quick Evaluation

## 2021-10-02 NOTE — Interval H&P Note (Signed)
History and Physical Interval Note:  10/02/2021 1:37 PM  Tracey Huff  has presented today for surgery, with the diagnosis of Status Post Breast Reconstruction.  The various methods of treatment have been discussed with the patient and family. After consideration of risks, benefits and other options for treatment, the patient has consented to  Procedure(s): REMOVAL OF BILATERAL TISSUE EXPANDERS WITH PLACEMENT OF BILATERAL BREAST IMPLANTS (Bilateral) as a surgical intervention.  [name] history has been reviewed, patient examined, no change in status, stable for surgery.  I have reviewed the patient's chart and labs.  Questions were answered to the patient's satisfaction.     Tracey Huff

## 2021-10-02 NOTE — Op Note (Signed)
Operative Note   DATE OF OPERATION: 10/02/2021  SURGICAL DEPARTMENT: Plastic Surgery  PREOPERATIVE DIAGNOSES: Breast cancer  POSTOPERATIVE DIAGNOSES:  same  PROCEDURE: 1.  Exchange bilateral breast tissue expander for gel implant 2.  Bilateral periprosthetic capsulotomy  SURGEON: Talmadge Coventry, MD  ASSISTANT: Verdie Shire, PA The advanced practice practitioner (APP) assisted throughout the case.  The APP was essential in retraction and counter traction when needed to make the case progress smoothly.  This retraction and assistance made it possible to see the tissue planes for the procedure.  The assistance was needed for hemostasis, tissue re-approximation and closure of the incision site.   ANESTHESIA:  General.   COMPLICATIONS: None.   INDICATIONS FOR PROCEDURE:  The patient, Tracey Huff is a 51 y.o. female born on [birth year], is here for treatment of breast reconstruction MRN: [id number]  CONSENT:  Informed consent was obtained directly from the patient. Risks, benefits and alternatives were fully discussed. Specific risks including but not limited to bleeding, infection, hematoma, seroma, scarring, pain, contracture, asymmetry, wound healing problems, and need for further surgery were all discussed. The patient did have an ample opportunity to have questions answered to satisfaction.   DESCRIPTION OF PROCEDURE:  The patient was taken to the operating room. SCDs were placed and antibiotics were given.  General anesthesia was administered.  [name] operative site was prepped and draped in a sterile fashion. A time out was performed and all information was confirmed to be correct.  Started by infiltrating local anesthetic in the area for the incisions and inferior on the left side and superior on the right side for the planned capsulotomies.  I then incised along the pre-existing scar on both sides with a 15 blade and dissected down to the tissue expander with cautery.   The fluid was evacuated and both tissue expanders were removed without incident.  I then performed a capsulotomy on the left side inferiorly and laterally and on the right side superiorly as the implant on the left was riding a bit higher.  560 cc sizers were then placed.  Patient was sat up and reasonably good symmetry was obtained.  She was then sat back down and the sizers were removed.  Hemostasis was ensured with cautery.  Both pockets were irrigated with triple antibiotic solutions.  Implants were then brought onto the field.  These were Mentor memory gel extra high-profile implants with 560 cc of volume.  Serial number for the left side [id number].  Serial number for the right side [id number].  Implants were then placed with a Keller funnel and appropriate orientation was ensured.  The acellular dermal matrix was closed on each side with a running 3-0 PDS.  The skin was closed with buried INSORB staples and a running 3 OV lock.  This gave her great on table result.  Benzoin Steri-Strips and a soft dressing were applied.  The patient tolerated the procedure well.  There were no complications. The patient was allowed to wake from anesthesia, extubated and taken to the recovery room in satisfactory condition.

## 2021-10-02 NOTE — Anesthesia Procedure Notes (Signed)
Procedure Name: Intubation Date/Time: 10/02/2021 2:20 PM Performed by: Ezequiel Kayser, CRNA Pre-anesthesia Checklist: Patient identified, Emergency Drugs available, Suction available and Patient being monitored Patient Re-evaluated:Patient Re-evaluated prior to induction Oxygen Delivery Method: Circle System Utilized Preoxygenation: Pre-oxygenation with 100% oxygen Induction Type: IV induction Ventilation: Mask ventilation without difficulty Laryngoscope Size: 3 and Mac Grade View: Grade III Tube type: Oral Tube size: 7.0 mm Number of attempts: 1 Airway Equipment and Method: Stylet and Oral airway Placement Confirmation: ETT inserted through vocal cords under direct vision, positive ETCO2 and breath sounds checked- equal and bilateral Secured at: 22 cm Tube secured with: Tape Dental Injury: Teeth and Oropharynx as per pre-operative assessment

## 2021-10-02 NOTE — Transfer of Care (Signed)
Immediate Anesthesia Transfer of Care Note  Patient: Tracey Huff  Procedure(s) Performed: REMOVAL OF BILATERAL TISSUE EXPANDERS WITH PLACEMENT OF BILATERAL BREAST IMPLANTS (Bilateral: Breast)  Patient Location: PACU  Anesthesia Type:General  Level of Consciousness: awake  Airway & Oxygen Therapy: Patient Spontanous Breathing and Patient connected to face mask  Post-op Assessment: Report given to RN and Post -op Vital signs reviewed and stable  Post vital signs: Reviewed and stable  Last Vitals:  Vitals Value Taken Time  BP    Temp    Pulse 95 10/02/21 1526  Resp 21 10/02/21 1526  SpO2 97 % 10/02/21 1526  Vitals shown include unvalidated device data.  Last Pain:  Vitals:   10/02/21 1226  TempSrc: Oral  PainSc: 0-No pain      Patients Stated Pain Goal: 3 (09/79/49 9718)  Complications: No notable events documented.

## 2021-10-03 ENCOUNTER — Encounter (HOSPITAL_BASED_OUTPATIENT_CLINIC_OR_DEPARTMENT_OTHER): Payer: Self-pay | Admitting: Plastic Surgery

## 2021-10-03 ENCOUNTER — Ambulatory Visit: Payer: 59 | Admitting: Psychologist

## 2021-10-10 ENCOUNTER — Ambulatory Visit (INDEPENDENT_AMBULATORY_CARE_PROVIDER_SITE_OTHER): Payer: 59 | Admitting: Plastic Surgery

## 2021-10-10 ENCOUNTER — Encounter: Payer: Self-pay | Admitting: Plastic Surgery

## 2021-10-10 DIAGNOSIS — Z170 Estrogen receptor positive status [ER+]: Secondary | ICD-10-CM

## 2021-10-10 DIAGNOSIS — C50412 Malignant neoplasm of upper-outer quadrant of left female breast: Secondary | ICD-10-CM

## 2021-10-10 NOTE — Progress Notes (Signed)
Patient presents 1 week postop from exchange of tissue expanders to gel implants.  She is overall very happy.  On exam everything looks to be healing well.  She has a nice shape size and symmetry.  We discussed continuing to wear compressive garments and avoiding strenuous activity and see we will see her again in 3 to 4 weeks.  All of her questions were answered.

## 2021-10-22 ENCOUNTER — Encounter: Payer: Self-pay | Admitting: Physician Assistant

## 2021-10-22 ENCOUNTER — Other Ambulatory Visit: Payer: Self-pay | Admitting: Physician Assistant

## 2021-10-26 ENCOUNTER — Other Ambulatory Visit: Payer: Self-pay | Admitting: Physician Assistant

## 2021-10-29 ENCOUNTER — Other Ambulatory Visit (HOSPITAL_COMMUNITY): Payer: Self-pay

## 2021-10-31 ENCOUNTER — Ambulatory Visit (INDEPENDENT_AMBULATORY_CARE_PROVIDER_SITE_OTHER): Payer: 59 | Admitting: Surgical

## 2021-10-31 ENCOUNTER — Encounter: Payer: Self-pay | Admitting: Surgical

## 2021-10-31 DIAGNOSIS — Z170 Estrogen receptor positive status [ER+]: Secondary | ICD-10-CM

## 2021-10-31 DIAGNOSIS — Z9889 Other specified postprocedural states: Secondary | ICD-10-CM

## 2021-10-31 DIAGNOSIS — C50412 Malignant neoplasm of upper-outer quadrant of left female breast: Secondary | ICD-10-CM

## 2021-10-31 NOTE — Progress Notes (Signed)
51 year old female here for follow-up after exchange of bilateral breast tissue expanders to bilateral silicone implants with Dr. Claudia Desanctis on 10/02/2021.  Of note, she had 560 cc Mentor memory gel extra high-profile implant placed bilaterally.  She is very happy with her recovery thus far, is pleased with her reconstruction at this point.  She has previously discussed nipple areola tattooing and is planning to do this at a later time.  She is not having any infectious symptoms.  She does have some questions about compression garments.  Chaperone present on exam On exam bilateral breast incisions intact, healing well, bilateral breasts are symmetric, implants are soft.  No erythema or cellulitic changes.  No subcutaneous fluid collections noted.  Mastectomy flaps are viable.  There is some mild superior medial pole volume loss of the left breast but otherwise she has great shape and upper pole volume.  We discussed ongoing restrictions as well as ongoing use of compression garments. All of her questions were answered to her content.  We briefly discussed nipple areolar tattooing.  We discussed options for fat grafting to breasts to improve contour if she does notice some settling and volume loss superiorly.  Recommend calling with questions or concerns, follow-up in 2 months to discuss tattooing.

## 2021-11-07 ENCOUNTER — Ambulatory Visit: Payer: 59 | Admitting: Nurse Practitioner

## 2021-11-07 ENCOUNTER — Other Ambulatory Visit: Payer: Self-pay

## 2021-11-07 ENCOUNTER — Encounter: Payer: Self-pay | Admitting: Nurse Practitioner

## 2021-11-07 ENCOUNTER — Other Ambulatory Visit (HOSPITAL_COMMUNITY): Payer: Self-pay

## 2021-11-07 VITALS — BP 126/82 | HR 86 | Temp 98.7°F | Resp 14 | Ht 60.0 in | Wt 184.8 lb

## 2021-11-07 DIAGNOSIS — C50412 Malignant neoplasm of upper-outer quadrant of left female breast: Secondary | ICD-10-CM

## 2021-11-07 DIAGNOSIS — E785 Hyperlipidemia, unspecified: Secondary | ICD-10-CM

## 2021-11-07 DIAGNOSIS — Z1211 Encounter for screening for malignant neoplasm of colon: Secondary | ICD-10-CM

## 2021-11-07 DIAGNOSIS — J4540 Moderate persistent asthma, uncomplicated: Secondary | ICD-10-CM

## 2021-11-07 DIAGNOSIS — J4530 Mild persistent asthma, uncomplicated: Secondary | ICD-10-CM

## 2021-11-07 DIAGNOSIS — Z79899 Other long term (current) drug therapy: Secondary | ICD-10-CM | POA: Diagnosis not present

## 2021-11-07 DIAGNOSIS — R7303 Prediabetes: Secondary | ICD-10-CM | POA: Diagnosis not present

## 2021-11-07 DIAGNOSIS — Z170 Estrogen receptor positive status [ER+]: Secondary | ICD-10-CM

## 2021-11-07 DIAGNOSIS — I10 Essential (primary) hypertension: Secondary | ICD-10-CM | POA: Diagnosis not present

## 2021-11-07 DIAGNOSIS — L918 Other hypertrophic disorders of the skin: Secondary | ICD-10-CM

## 2021-11-07 MED ORDER — NA SULFATE-K SULFATE-MG SULF 17.5-3.13-1.6 GM/177ML PO SOLN
1.0000 | Freq: Once | ORAL | 0 refills | Status: AC
  Filled 2021-11-07: qty 354, 1d supply, fill #0

## 2021-11-07 NOTE — Assessment & Plan Note (Signed)
Continue taking tamoxifen per her oncologist.

## 2021-11-07 NOTE — Assessment & Plan Note (Signed)
Blood pressure is at goal.  Continue taking losartan 25 mg daily.

## 2021-11-07 NOTE — Assessment & Plan Note (Signed)
Patient is currently taking atorvastatin 10 mg every other day.  She says that this bowling when she can tolerate it.

## 2021-11-07 NOTE — Assessment & Plan Note (Signed)
Patient reports that her asthma has been difficult lately due to the air quality.  Patient states she talk to her pulmonologist Dr. Donnetta Simpers.  He told her to continue taking the Trelegy, albuterol and Singulair and to also use her DuoNeb at night.

## 2021-11-07 NOTE — Progress Notes (Signed)
BP 126/82   Pulse 86   Temp 98.7 F (37.1 C) (Oral)   Resp 14   Ht 5' (1.524 m)   Wt 184 lb 12.8 oz (83.8 kg)   LMP 11/15/2014   SpO2 99%   BMI 36.09 kg/m    Subjective:    Patient ID: Tracey Huff, female    DOB: 16-Nov-1970, 51 y.o.   MRN: [id number]  HPI: ZHARA Huff is a 51 y.o. female  Chief Complaint  Patient presents with   Hypertension   Hyperlipidemia    6 month follow up   HTN: She says she does not check her blood pressure at home.  She is currently taking losartan 25 mg daily.  She denies any chest pain, headaches, shortness of breath or blurred vision.  Her blood pressure today is 126/82.  Hyperlipidemia: Patient has reported that she has trouble tolerating her atorvastatin.  She has currently been taking half a tablet every other day.  She has been taking 10 mg every other day.  Her last LDL was 169 on 05/09/2021.   The 10-year ASCVD risk score (Arnett DK, et al., 2019) is: 4.3%   Values used to calculate the score:     Age: 68 years     Sex: Female     Is Non-Hispanic African American: Yes     Diabetic: No     Tobacco smoker: No     Systolic Blood Pressure: 126 mmHg     Is BP treated: Yes     HDL Cholesterol: 53 mg/dL     Total Cholesterol: 240 mg/dL   Prediabetes: Her last A1c was 6.4 on 05/09/2021.  Patient is trying to watch her diet.  She says she is struggling with that due to finding breast cancer.   Asthma: She is currently on Trelegy, albuterol and Singulair. She says she has been doing Duoneb every night.  She says she has been having shortness of breath due to the air quality.  She reached out to Dr. Laneta Simmers and encouraged to continue with current treatment.   Breast cancer: At her appointment on May 09, 2021 she had reported to me that she had had a biopsy done on her left breast and it was positive for cancer.  She says she has markers on her right breast so she is going ahead and getting a double mastectomy.  The surgery was scheduled  for May 15, 2021 with a reconstruction by Dr. Everlene Farrier at [facility].  She last saw Dr. Everlene Farrier on 09/17/2021. She then saw plastic surgery for exchange of tissue expanders for implants.  She was last seen by plastic surgery on 10/31/2021. Her oncologist is Dr. Donneta Romberg.  She last saw oncology on 08/21/2021.  She has been tolerating tamoxifen well, except for hot flashes.  She follows up with oncology this month.  Carcinoma of upper-outer quadrant of left breast in female, estrogen receptor positive (HCC) #Left breast cancer T69mN0- G-2; ER/PR positive HER2 negative status BIL postmastectomy [positive for CHEK-2 mutation].  Oncotype recurrence score 16/LOW. On Tamoxifen [s/p hysterectomy [intact ovaries]; no role for mammograms.  Skin tags: patient reports that she has had skin tags on her face and neck for many years. She would like to have them removed.  Referral placed to dermatology.   Relevant past medical, surgical, family and social history reviewed and updated as indicated. Interim medical history since our last visit reviewed. Allergies and medications reviewed and updated.  Review of Systems  Constitutional:  Negative for fever or weight change.  Respiratory: Negative for cough and shortness of breath.   Cardiovascular: Negative for chest pain or palpitations.  Gastrointestinal: Negative for abdominal pain, no bowel changes.  Musculoskeletal: Negative for gait problem or joint swelling.  Skin: Negative for rash. Positive for skin tags Neurological: Negative for dizziness or headache.  No other specific complaints in a complete review of systems (except as listed in HPI above).      Objective:    BP 126/82   Pulse 86   Temp 98.7 F (37.1 C) (Oral)   Resp 14   Ht 5' (1.524 m)   Wt 184 lb 12.8 oz (83.8 kg)   LMP 11/15/2014   SpO2 99%   BMI 36.09 kg/m   Wt Readings from Last 3 Encounters:  11/07/21 184 lb 12.8 oz (83.8 kg)  10/02/21 182 lb 8.7 oz (82.8 kg)  09/17/21 180  lb (81.6 kg)    Physical Exam  Constitutional: Patient appears well-developed and well-nourished. Obese  No distress.  HEENT: head atraumatic, normocephalic, pupils equal and reactive to light, neck supple, throat within normal limits Cardiovascular: Normal rate, regular rhythm and normal heart sounds.  No murmur heard. No BLE edema. Pulmonary/Chest: Effort normal and breath sounds normal. No respiratory distress. Abdominal: Soft.  There is no tenderness. Skin: skin tags on face and neck Psychiatric: Patient has a normal mood and affect. behavior is normal. Judgment and thought content normal.  Results for orders placed or performed in visit on 05/29/21  Estradiol  Result Value Ref Range   Estradiol 345.0 pg/mL  FSH/LH  Result Value Ref Range   LH 20.3 mIU/mL   FSH 4.2 mIU/mL      Assessment & Plan:   Problem List Items Addressed This Visit       Cardiovascular and Mediastinum   Essential hypertension - Primary    Blood pressure is at goal.  Continue taking losartan 25 mg daily.      Relevant Orders   CBC with Differential/Platelet   COMPLETE METABOLIC PANEL WITH GFR     Respiratory   Asthma   Mild persistent asthma, uncomplicated    Patient reports that her asthma has been difficult lately due to the air quality.  Patient states she talk to her pulmonologist Dr. Donnetta Simpers.  He told her to continue taking the Trelegy, albuterol and Singulair and to also use her DuoNeb at night.         Other   Hyperlipidemia    Patient is currently taking atorvastatin 10 mg every other day.  She says that this bowling when she can tolerate it.      Relevant Orders   Lipid panel   Prediabetes    Continue work on lifestyle modification.      Relevant Orders   Hemoglobin A1c   Carcinoma of upper-outer quadrant of left breast in female, estrogen receptor positive ([place])    Continue taking tamoxifen per her oncologist.      Other Visit Diagnoses     Medication management        Relevant Orders   Lipid panel   CBC with Differential/Platelet   COMPLETE METABOLIC PANEL WITH GFR   Skin tag       Referral placed to dermatology   Relevant Orders   Ambulatory referral to Dermatology   Screening for colon cancer       Relevant Orders   Ambulatory referral to Gastroenterology        Follow up  plan: Return in about 6 months (around 05/10/2022) for follow up.

## 2021-11-07 NOTE — Assessment & Plan Note (Signed)
Continue work on lifestyle modification.

## 2021-11-07 NOTE — Progress Notes (Signed)
Gastroenterology Pre-Procedure Review  Request Date: 01/04/2022 Requesting Physician: Dr. Vicente Males   PATIENT REVIEW QUESTIONS: The patient responded to the following health history questions as indicated:    1. Are you having any GI issues? no 2. Do you have a personal history of Polyps? no 3. Do you have a family history of Colon Cancer or Polyps? no 4. Diabetes Mellitus? no 5. Joint replacements in the past 12 months?no 6. Major health problems in the past 3 months?may 30 vasectamy 7. Any artificial heart valves, MVP, or defibrillator?no    MEDICATIONS & ALLERGIES:    Patient reports the following regarding taking any anticoagulation/antiplatelet therapy:   Plavix, Coumadin, Eliquis, Xarelto, Lovenox, Pradaxa, Brilinta, or Effient? no Aspirin? no  Patient confirms/reports the following medications:  Current Outpatient Medications  Medication Sig Dispense Refill   albuterol (PROVENTIL HFA) 108 (90 Base) MCG/ACT inhaler Inhale 1 to 2 puffs every 4-6 hours as needed for cough/wheeze 18 g 0   Ascorbic Acid (VITAMIN C) 100 MG tablet Take 100 mg by mouth daily.     atorvastatin (LIPITOR) 20 MG tablet Take 1 tablet (20 mg total) by mouth at bedtime. (Patient taking differently: Take 10 mg by mouth every other day. Bedtime) 90 tablet 3   AUVI-Q 0.3 MG/0.3ML SOAJ injection Inject 0.3 mg into the muscle as needed for anaphylaxis.     azelastine (ASTELIN) 0.1 % nasal spray Place 1-2 sprays into both nostrils 2 (two) times daily. (Patient taking differently: Place 1-2 sprays into both nostrils daily as needed for rhinitis or allergies.) 30 mL 6   busPIRone (BUSPAR) 7.5 MG tablet Take 1 tablet once daily at night; may take 1 additional tablet daily if needed for anxiety. (Patient taking differently: Take 7.5 mg by mouth as needed. Take 1 tablet once daily at night; may take 1 additional tablet daily if needed for anxiety.) 180 tablet 1   Cholecalciferol 25 MCG (1000 UT) capsule Take 1,000 Units by  mouth daily.     diphenhydrAMINE (BENADRYL) 2 % cream Apply 1 application topically 2 (two) times daily as needed for itching.     ELDERBERRY PO Take 1 capsule by mouth daily.     fluticasone (FLONASE) 50 MCG/ACT nasal spray Place 1-2 sprays into both nostrils daily. (Patient taking differently: Place 1-2 sprays into both nostrils daily as needed for allergies or rhinitis.) 16 g 6   Fluticasone-Umeclidin-Vilant (TRELEGY ELLIPTA) 200-62.5-25 MCG/ACT AEPB Inhale 1 puff into the lungs daily. (Patient taking differently: Inhale 1 puff into the lungs every morning.) 60 each 6   hydrocortisone cream 1 % Apply 1 application topically daily as needed for itching.     hydrOXYzine (VISTARIL) 25 MG capsule TAKE 1 CAPSULE (25 MG TOTAL) BY MOUTH AT BEDTIME AS NEEDED FOR UP TO 30 DOSES (DIFFICULTY SLEEPING). 30 capsule 1   ibuprofen (ADVIL) 200 MG tablet Take 400 mg by mouth every 6 (six) hours as needed for moderate pain.     ipratropium-albuterol (DUONEB) 0.5-2.5 (3) MG/3ML SOLN Take 3 mLs by nebulization every 6 (six) hours as needed (wheeze, SOB, cough variant asthma). 360 mL 1   levocetirizine (XYZAL) 5 MG tablet Take 1 tablet (5 mg total) by mouth every evening. 30 tablet 6   losartan (COZAAR) 25 MG tablet Take 1 tablet (25 mg total) by mouth every morning. 90 tablet 1   montelukast (SINGULAIR) 10 MG tablet Take 1 tablet (10 mg total) by mouth every evening. 30 tablet 6   Olopatadine HCl (PATADAY) 0.2 % SOLN  Place 1 drop into affected eye daily. (Patient taking differently: Place 1 drop into both eyes daily as needed (itching).) 2.5 mL 6   ondansetron (ZOFRAN-ODT) 4 MG disintegrating tablet Take 1 tablet (4 mg total) by mouth every 8 (eight) hours as needed for nausea or vomiting. 20 tablet 0   pregabalin (LYRICA) 50 MG capsule Take 1 capsule (50 mg total) by mouth 3 (three) times daily. 30 capsule 0   tamoxifen (NOLVADEX) 20 MG tablet Take 1 tablet (20 mg total) by mouth daily. 90 tablet 1   No current  facility-administered medications for this visit.    Patient confirms/reports the following allergies:  Allergies  Allergen Reactions   Macadamia Nut Oil Shortness Of Breath   Apple Juice Nausea And Vomiting   [place] Nut (Berthol[place]fia [place]) Skin Test Nausea And Vomiting and Swelling    Tongue swelling   Carrot Oil     Can not cooked raw carrots   Fruit & Vegetable Daily [Nutritional Supplements] Nausea And Vomiting    Cannot tolerate apples, peaches, plums, and nectarines   Kiwi Extract Nausea And Vomiting   Peach [Prunus Persica] Nausea And Vomiting    No orders of the defined types were placed in this encounter.   AUTHORIZATION INFORMATION Primary Insurance: 1D#: Group #:  Secondary Insurance: 1D#: Group #:  SCHEDULE INFORMATION: Date: 01/04/2022 Time: Location:[facility]

## 2021-11-08 LAB — COMPLETE METABOLIC PANEL WITH GFR
AG Ratio: 1.3 (calc) (ref 1.0–2.5)
ALT: 18 U/L (ref 6–29)
AST: 19 U/L (ref 10–35)
Albumin: 4.2 g/dL (ref 3.6–5.1)
Alkaline phosphatase (APISO): 47 U/L (ref 37–153)
BUN: 14 mg/dL (ref 7–25)
CO2: 23 mmol/L (ref 20–32)
Calcium: 9.6 mg/dL (ref 8.6–10.4)
Chloride: 106 mmol/L (ref 98–110)
Creat: 0.87 mg/dL (ref 0.50–1.03)
Globulin: 3.3 g/dL (calc) (ref 1.9–3.7)
Glucose, Bld: 107 mg/dL — ABNORMAL HIGH (ref 65–99)
Potassium: 4.5 mmol/L (ref 3.5–5.3)
Sodium: 140 mmol/L (ref 135–146)
Total Bilirubin: 0.3 mg/dL (ref 0.2–1.2)
Total Protein: 7.5 g/dL (ref 6.1–8.1)
eGFR: 81 mL/min/{1.73_m2} (ref >=60)

## 2021-11-08 LAB — CBC WITH DIFFERENTIAL/PLATELET
Absolute Monocytes: 518 cells/uL (ref 200–950)
Basophils Absolute: 52 cells/uL (ref 0–200)
Basophils Relative: 0.7 %
Eosinophils Absolute: 200 cells/uL (ref 15–500)
Eosinophils Relative: 2.7 %
HCT: 37.6 % (ref 35.0–45.0)
Hemoglobin: 12.3 g/dL (ref 11.7–15.5)
Lymphs Abs: 2538 cells/uL (ref 850–3900)
MCH: 27.3 pg (ref 27.0–33.0)
MCHC: 32.7 g/dL (ref 32.0–36.0)
MCV: 83.4 fL (ref 80.0–100.0)
MPV: 9.8 fL (ref 7.5–12.5)
Monocytes Relative: 7 %
Neutro Abs: 4092 cells/uL (ref 1500–7800)
Neutrophils Relative %: 55.3 %
Platelets: 379 10*3/uL (ref 140–400)
RBC: 4.51 10*6/uL (ref 3.80–5.10)
RDW: 14.2 % (ref 11.0–15.0)
Total Lymphocyte: 34.3 %
WBC: 7.4 10*3/uL (ref 3.8–10.8)

## 2021-11-08 LAB — LIPID PANEL
Cholesterol: 251 mg/dL — ABNORMAL HIGH (ref <200)
HDL: 54 mg/dL (ref >=50)
LDL Cholesterol (Calc): 168 mg/dL (calc) — ABNORMAL HIGH
Non-HDL Cholesterol (Calc): 197 mg/dL (calc) — ABNORMAL HIGH (ref <130)
Total CHOL/HDL Ratio: 4.6 (calc) (ref <5.0)
Triglycerides: 150 mg/dL — ABNORMAL HIGH (ref <150)

## 2021-11-08 LAB — HEMOGLOBIN A1C
Hgb A1c MFr Bld: 6.3 % of total Hgb — ABNORMAL HIGH (ref <5.7)
Mean Plasma Glucose: 134 mg/dL
eAG (mmol/L): 7.4 mmol/L

## 2021-11-09 ENCOUNTER — Other Ambulatory Visit: Payer: Self-pay | Admitting: Nurse Practitioner

## 2021-11-09 ENCOUNTER — Other Ambulatory Visit (HOSPITAL_COMMUNITY): Payer: Self-pay

## 2021-11-09 DIAGNOSIS — E785 Hyperlipidemia, unspecified: Secondary | ICD-10-CM

## 2021-11-09 MED ORDER — EZETIMIBE 10 MG PO TABS
10.0000 mg | ORAL_TABLET | Freq: Every day | ORAL | 3 refills | Status: DC
  Filled 2021-11-09: qty 67, 67d supply, fill #0
  Filled 2021-11-09: qty 23, 23d supply, fill #0

## 2021-11-20 ENCOUNTER — Encounter: Payer: Self-pay | Admitting: Internal Medicine

## 2021-11-20 ENCOUNTER — Inpatient Hospital Stay: Payer: 59 | Attending: Internal Medicine | Admitting: Internal Medicine

## 2021-11-20 DIAGNOSIS — Z170 Estrogen receptor positive status [ER+]: Secondary | ICD-10-CM | POA: Diagnosis not present

## 2021-11-20 DIAGNOSIS — C50412 Malignant neoplasm of upper-outer quadrant of left female breast: Secondary | ICD-10-CM | POA: Insufficient documentation

## 2021-11-20 DIAGNOSIS — Z79810 Long term (current) use of selective estrogen receptor modulators (SERMs): Secondary | ICD-10-CM | POA: Diagnosis not present

## 2021-11-20 NOTE — Progress Notes (Signed)
[facility] NOTE  Patient Care Team: Bo Merino, FNP as PCP - General (Nurse Practitioner) Clent Jacks, RN as Oncology Nurse Navigator Theodore Demark, RN (Inactive) as Oncology Nurse Navigator Pabon, Marjory Lies, MD as Consulting Physician (General Surgery) Cammie Sickle, MD as Consulting Physician (Oncology) Cindra Presume, MD as Consulting Physician (Plastic Surgery)  CHIEF COMPLAINTS/PURPOSE OF CONSULTATION: Breast cancer     Oncology History Overview Note  # On physical exam, I palpate a focal firm 2 x 5 cm masslike area over the 11 to 1:30 position of the left breast approximately 2-4 cm from the nipple.   Targeted ultrasound is performed, showing no discrete focal abnormality over the upper mid to outer left breast. There is a pattern of dense fibroglandular tissue over this area which appears slightly different from the dense fibroglandular tissue elsewhere in the left breast. There is hazy decreased echogenicity with subtle shadowing within this dense tissue at the 12 to 1 o'clock position. Some of this vague hazy decreased echogenicity with shadowing is present at the 12 o'clock position 4 cm from the nipple more focal with harmonics and measures approximately 1.4 x 1.6 x 2 cm. There is another more focal area of decreased echogenicity and shadowing at the 1 o'clock position of the left breast 2 cm from the nipple measuring 1.4 x 1.5 x 1.5 cm. These 2 areas appear to be contiguous and are likely part of the same process.  1.4 x 1.6 x 2 cm. There is another more focal area of decreased echogenicity and shadowing at the 1 o'clock position of the left breast 2 cm from the nipple measuring 1.4 x 1.5 x 1.5 cm. These 2 areas appear to be contiguous and are likely part of the same process.  DIAGNOSIS:  A. 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   Size of invasive carcinoma: 2  mm in this sample  Histologic grade of invasive carcinoma: Grade 2                       Glandular/tubular differentiation score: 3                       Nuclear pleomorphism score: 2                       Mitotic rate score: 1                       Total score: 6  Ductal carcinoma in situ: Present, intermediate grade  Lymphovascular invasion: Not identified    Ultrasound the left axilla is normal. CASE SUMMARY: BREAST BIOMARKER TESTS  Estrogen Receptor (ER) Status: POSITIVE          Percentage of cells with nuclear positivity: 90-100%          Average intensity of staining: Strong   Progesterone Receptor (PgR) Status: POSITIVE          Percentage of cells with nuclear positivity: 90-100%          Average intensity of staining: Strong   HER2 (by immunohistochemistry): NEGATIVE (Score 1+)  Ki-67: Not performed  Procedure: Simple mastectomy  Specimen Laterality: Left (bilateral mastectomy, tumor in left)   TUMOR  Histologic Type: Invasive mammary carcinoma, no special type  Histologic Grade (Nottingham Histologic Score)  Glandular (Acinar)/Tubular Differentiation: 2                       Nuclear Pleomorphism: 2                       Mitotic Rate: 2                       Overall Grade: 2  Tumor Size: 10 mm  Ductal Carcinoma In Situ (DCIS): Present, with extensive intraductal  component  Lymphovascular Invasion: Not identified  Treatment Effect in the Breast: No known presurgical treatment   MARGINS  Margin Status for Invasive Carcinoma: All margins negative for invasive  carcinoma                       Distance from invasive carcinoma to closest margin:  1.2 cm, posterior   Margin Status for DCIS: Margin(s) Involved by DCIS: All margins negative  for DCIS                       Distance from DCIS to closest margin: 1.2 cm,  posterior   REGIONAL LYMPH NODES  Regional Lymph Node Status: All regional lymph nodes negative for tumor                        Total Number of Lymph Nodes Examined (sentinel and  non-sentinel): 3                       Number of Sentinel Nodes Examined: 3   DISTANT METASTASIS  Distant Site(s) Involved, if applicable: Not applicable   PATHOLOGIC STAGE CLASSIFICATION (pTNM, AJCC 8th Edition):  TNM Descriptors: M (multifocal)  pT Category: pT1b  Regional Lymph Nodes Modifier: sn  pN Category: pN0  pM Category: Not applicable   SPECIAL STUDIES  Breast Biomarker Testing Performed on Previous Biopsy: [id number]   Estrogen Receptor (ER) Status: POSITIVE  Progesterone Receptor (PgR) Status: POSITIVE  HER2 (by immunohistochemistry): NEGATIVE (Score 1+)   #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  #June 19, 2021-tamoxifen 20 mg a day [ estrogen/LH-?  Postmenopausal].   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 VUS in RAD51D called c.434G> and in [facility] called c.476C>T also identified. The final report date is 05/09/2020.  The CancerNext-Expanded + RNAinsight gene panel offered by [employer] and includes sequencing and rearrangement analysis for the following 77 genes: IP, ALK, APC*, ATM*, AXIN2, BAP1, BARD1, BLM, BMPR1A, BRCA1*, BRCA2*, BRIP1*, CDC73, CDH1*,CDK4, CDKN1B, CDKN2A, CHEK2*, CTNNA1, DICER1, FANCC, FH, FLCN, GALNT12, KIF1B, LZTR1, MAX, MEN1, MET, MLH1*, MSH2*, MSH3, MSH6*, MUTYH*, NBN, NF1*, NF2, NTHL1, PALB2*, PHOX2B, PMS2*, POT1, PRKAR1A, PTCH1, PTEN*, RAD51C*, RAD51D*,RB1, RECQL, RET, SDHA, SDHAF2, SDHB, SDHC, SDHD,  SMAD4, SMARCA4, SMARCB1, SMARCE1, STK11, SUFU, TMEM127, TP53*,TSC1, TSC2, VHL and XRCC2 (sequencing and deletion/duplication); EGFR, EGLN1, HOXB13, KIT, MITF, PDGFRA, POLD1 and POLE (sequencing only); EPCAM and GREM1 (deletion/duplication only).    05/29/2021 Cancer Staging   Staging form:  Breast, AJCC 8th Edition - Pathologic: pT1b, pN0, cM0, ER+, PR+, HER2- - Signed by Cammie Sickle, MD on 05/29/2021 Nuclear grade: G2      HISTORY OF PRESENTING ILLNESS: Alone.  Ambulating independently.  Tracey Huff 51 y.o.  female patient with stage I ER/PR positive HER2 negative left breast cancer  [CHEK2 mutant] s/p bilateral mastectomies on tamoxifen is here for follow-up.  Patient is clinically doing well.  Complains of hot flashes mild to moderate intense.  Review of Systems  Constitutional:  Negative for chills, diaphoresis, fever, malaise/fatigue and weight loss.  HENT:  Negative for nosebleeds and sore throat.   Eyes:  Negative for double vision.  Respiratory:  Negative for cough, hemoptysis, sputum production, shortness of breath and wheezing.   Cardiovascular:  Negative for chest pain, palpitations, orthopnea and leg swelling.  Gastrointestinal:  Negative for abdominal pain, blood in stool, constipation, diarrhea, heartburn, melena, nausea and vomiting.  Genitourinary:  Negative for dysuria, frequency and urgency.  Musculoskeletal:  Negative for back pain and joint pain.  Skin: Negative.  Negative for itching and rash.  Neurological:  Negative for dizziness, tingling, focal weakness, weakness and headaches.  Endo/Heme/Allergies:  Does not bruise/bleed easily.  Psychiatric/Behavioral:  Negative for depression. The patient is not nervous/anxious and does not have insomnia.      MEDICAL HISTORY:  Past Medical History:  Diagnosis Date  . Allergy   . Anxiety   . Asthma   . Breast cancer ([place])    INVASIVE MAMMARY CARCINOMA- left  . Family history of prostate cancer   .  Fibroid uterus 11/15/2014  . Hyperlipidemia   . Hypertension   . Menorrhagia with irregular cycle 11/15/2014  . Pneumonia   . Pre-diabetes   . Shortness of breath dyspnea     SURGICAL HISTORY: Past Surgical History:  Procedure Laterality Date  . ABDOMINAL HYSTERECTOMY N/A 11/15/2014   Procedure: HYSTERECTOMY ABDOMINAL/BILATERAL SALPINGECTOMY ;  Surgeon: Will Bonnet, MD;  Location: [facility] ORS;  Service: Gynecology;  Laterality: N/A;  . BREAST BIOPSY Right    2019 negative  . BREAST BIOPSY Left 03/09/2021   u/s biopsy, 12-1 o'clock, VENUS" clip-path pending  . BREAST LUMPECTOMY,RADIO FREQ LOCALIZER,AXILLARY SENTINEL LYMPH NODE BIOPSY Left 03/28/2021   Procedure: BREAST LUMPECTOMY,RADIO FREQ LOCALIZER,AXILLARY SENTINEL LYMPH NODE BIOPSY;  Surgeon: Jules Husbands, MD;  Location: [facility] ORS;  Service: General;  Laterality: Left;  . BREAST RECONSTRUCTION WITH PLACEMENT OF TISSUE EXPANDER AND FLEX HD (ACELLULAR HYDRATED DERMIS) Bilateral 05/15/2021   Procedure: BILATERAL BREAST RECONSTRUCTION WITH PLACEMENT OF TISSUE EXPANDER AND FLEX HD (ACELLULAR HYDRATED DERMIS);  Surgeon: Cindra Presume, MD;  Location: [facility] ORS;  Service: Plastics;  Laterality: Bilateral;  . CESAREAN SECTION  1990  . COLPOSCOPY  07/28/2018  . CYSTOSCOPY N/A 11/15/2014   Procedure: CYSTOSCOPY;  Surgeon: Will Bonnet, MD;  Location: [facility] ORS;  Service: Gynecology;  Laterality: N/A;  . DILATION AND CURETTAGE OF UTERUS    . GANGLION CYST EXCISION Left   . REMOVAL OF BILATERAL TISSUE EXPANDERS WITH PLACEMENT OF BILATERAL BREAST IMPLANTS Bilateral 10/02/2021   Procedure: REMOVAL OF BILATERAL TISSUE EXPANDERS WITH PLACEMENT OF BILATERAL BREAST IMPLANTS;  Surgeon: Cindra Presume, MD;  Location: [facility];  Service: Plastics;  Laterality: Bilateral;  . TOTAL MASTECTOMY Bilateral 05/15/2021   Procedure: TOTAL MASTECTOMY, Bilateral Simple Mastectomy, skin sparing;  Surgeon: Jules Husbands, MD;  Location: [facility]  ORS;  Service: General;  Laterality: Bilateral;  Provider requesting for 2 hours / 120  min for procedure.  Marland Kitchen UTERINE FIBROID SURGERY      SOCIAL HISTORY: Social History   Socioeconomic History  . Marital status: Married    Spouse name: Fritz Pickerel  . Number of children: 2  . Years of education: Not on file  . Highest education level: Not on file  Occupational History  . Not on file  Tobacco Use  . Smoking status: Never    Passive exposure: Never  . Smokeless tobacco: Never  Vaping Use  . Vaping Use: Never used  Substance and Sexual Activity  . Alcohol use: Not Currently    Comment: occassional  . Drug use: No  . Sexual activity: Yes    Partners: Male    Birth control/protection: Surgical  Other Topics Concern  . Not on file  Social History Narrative   Works for [employer]; never smoked; no alcohol; 2 children [25 and 31y- at 2022]. Lives with husband.    Social Determinants of Health   Financial Resource Strain: Low Risk  (07/09/2021)   Overall Financial Resource Strain (CARDIA)   . Difficulty of Paying Living Expenses: Not very hard  Food Insecurity: No Food Insecurity (07/09/2021)   Hunger Vital Sign   . Worried About Charity fundraiser in the Last Year: Never true   . Ran Out of Food in the Last Year: Never true  Transportation Needs: No Transportation Needs (07/09/2021)   PRAPARE - Transportation   . Lack of Transportation (Medical): No   . Lack of Transportation (Non-Medical): No  Physical Activity: Inactive (07/09/2021)   Exercise Vital Sign   . Days of Exercise per Week: 0 days   . Minutes of Exercise per Session: 0 min  Stress: Stress Concern Present (07/09/2021)   [place]   . Feeling of Stress : To some extent  Social Connections: Socially Integrated (07/09/2021)   Social Connection and Isolation Panel [NHANES]   . Frequency of Communication with Friends and Family: Three times a week   .  Frequency of Social Gatherings with Friends and Family: Three times a week   . Attends Religious Services: More than 4 times per year   . Active Member of Clubs or Organizations: Yes   . Attends Archivist Meetings: 1 to 4 times per year   . Marital Status: Married  Human resources officer Violence: Unknown (07/09/2021)   Humiliation, Afraid, Rape, and Kick questionnaire   . Fear of Current or Ex-Partner: Patient refused   . Emotionally Abused: Patient refused   . Physically Abused: Patient refused   . Sexually Abused: Patient refused    FAMILY HISTORY: Family History  Problem Relation Age of Onset  . Congestive Heart Failure Mother   . Diabetes Mother   . Hypertension Mother   . Diabetes Maternal Grandmother   . Congestive Heart Failure Maternal Grandfather   . Breast cancer Neg Hx     ALLERGIES:  is allergic to macadamia nut oil, apple juice, [place] nut (berthollefia excelsa) skin test, carrot oil, fruit & vegetable daily [nutritional supplements], kiwi extract, and peach [prunus persica].  MEDICATIONS:  Current Outpatient Medications  Medication Sig Dispense Refill  . albuterol (PROVENTIL HFA) 108 (90 Base) MCG/ACT inhaler Inhale 1 to 2 puffs every 4-6 hours as needed for cough/wheeze 18 g 0  . Ascorbic Acid (VITAMIN C) 100 MG tablet Take 100 mg by mouth daily.    Marland Kitchen atorvastatin (LIPITOR) 20 MG tablet Take 1 tablet (20 mg total)  by mouth at bedtime. (Patient taking differently: Take 10 mg by mouth every other day. Bedtime) 90 tablet 3  . azelastine (ASTELIN) 0.1 % nasal spray Place 1-2 sprays into both nostrils 2 (two) times daily. (Patient taking differently: Place 1-2 sprays into both nostrils daily as needed for rhinitis or allergies.) 30 mL 6  . busPIRone (BUSPAR) 7.5 MG tablet Take 1 tablet once daily at night; may take 1 additional tablet daily if needed for anxiety. (Patient taking differently: Take 7.5 mg by mouth as needed. Take 1 tablet once daily at night; may take 1  additional tablet daily if needed for anxiety.) 180 tablet 1  . Cholecalciferol 25 MCG (1000 UT) capsule Take 1,000 Units by mouth daily.    . diphenhydrAMINE (BENADRYL) 2 % cream Apply 1 application topically 2 (two) times daily as needed for itching.    Marland Kitchen ELDERBERRY PO Take 1 capsule by mouth daily.    Marland Kitchen ezetimibe (ZETIA) 10 MG tablet Take 1 tablet (10 mg total) by mouth daily. 90 tablet 3  . fluticasone (FLONASE) 50 MCG/ACT nasal spray Place 1-2 sprays into both nostrils daily. (Patient taking differently: Place 1-2 sprays into both nostrils daily as needed for allergies or rhinitis.) 16 g 6  . Fluticasone-Umeclidin-Vilant (TRELEGY ELLIPTA) 200-62.5-25 MCG/ACT AEPB Inhale 1 puff into the lungs daily. (Patient taking differently: Inhale 1 puff into the lungs every morning.) 60 each 6  . hydrocortisone cream 1 % Apply 1 application topically daily as needed for itching.    . hydrOXYzine (VISTARIL) 25 MG capsule TAKE 1 CAPSULE (25 MG TOTAL) BY MOUTH AT BEDTIME AS NEEDED FOR UP TO 30 DOSES (DIFFICULTY SLEEPING). 30 capsule 1  . ibuprofen (ADVIL) 200 MG tablet Take 400 mg by mouth every 6 (six) hours as needed for moderate pain.    Marland Kitchen ipratropium-albuterol (DUONEB) 0.5-2.5 (3) MG/3ML SOLN Take 3 mLs by nebulization every 6 (six) hours as needed (wheeze, SOB, cough variant asthma). 360 mL 1  . levocetirizine (XYZAL) 5 MG tablet Take 1 tablet (5 mg total) by mouth every evening. 30 tablet 6  . losartan (COZAAR) 25 MG tablet Take 1 tablet (25 mg total) by mouth every morning. 90 tablet 1  . montelukast (SINGULAIR) 10 MG tablet Take 1 tablet (10 mg total) by mouth every evening. 30 tablet 6  . Olopatadine HCl (PATADAY) 0.2 % SOLN Place 1 drop into affected eye daily. (Patient taking differently: Place 1 drop into both eyes daily as needed (itching).) 2.5 mL 6  . ondansetron (ZOFRAN-ODT) 4 MG disintegrating tablet Take 1 tablet (4 mg total) by mouth every 8 (eight) hours as needed for nausea or vomiting. 20  tablet 0  . pregabalin (LYRICA) 50 MG capsule Take 1 capsule (50 mg total) by mouth 3 (three) times daily. 30 capsule 0  . tamoxifen (NOLVADEX) 20 MG tablet Take 1 tablet (20 mg total) by mouth daily. 90 tablet 1  . AUVI-Q 0.3 MG/0.3ML SOAJ injection Inject 0.3 mg into the muscle as needed for anaphylaxis. (Patient not taking: Reported on 11/20/2021)     No current facility-administered medications for this visit.      Marland Kitchen  PHYSICAL EXAMINATION: ECOG PERFORMANCE STATUS: 0 - Asymptomatic  Vitals:   11/20/21 1500  BP: 134/82  Pulse: 94  Resp: 16  Temp: 100.1 F (37.8 C)   Filed Weights   11/20/21 1500  Weight: 186 lb 3.2 oz (84.5 kg)   Physical Exam Vitals and nursing note reviewed.  HENT:  Head: Normocephalic and atraumatic.     Mouth/Throat:     Pharynx: Oropharynx is clear.  Eyes:     Extraocular Movements: Extraocular movements intact.     Pupils: Pupils are equal, round, and reactive to light.  Cardiovascular:     Rate and Rhythm: Normal rate and regular rhythm.  Pulmonary:     Comments: Decreased breath sounds bilaterally.  Abdominal:     Palpations: Abdomen is soft.  Musculoskeletal:        General: Normal range of motion.     Cervical back: Normal range of motion.  Skin:    General: Skin is warm.  Neurological:     General: No focal deficit present.     Mental Status: She is alert and oriented to person, place, and time.  Psychiatric:        Behavior: Behavior normal.        Judgment: Judgment normal.     LABORATORY DATA:  I have reviewed the data as listed Lab Results  Component Value Date   WBC 7.4 11/07/2021   HGB 12.3 11/07/2021   HCT 37.6 11/07/2021   MCV 83.4 11/07/2021   PLT 379 11/07/2021   Recent Labs    03/27/21 0924 05/09/21 1009 05/15/21 1907 05/16/21 0633 11/07/21 1033  NA 136 138  --  136 140  K 3.4* 4.3  --  3.6 4.5  CL 105 104  --  106 106  CO2 26 24  --  23 23  GLUCOSE 118* 104*  --  121* 107*  BUN 10 12  --  10 14   CREATININE 0.78 0.82 0.76 0.91 0.87  CALCIUM 9.1 9.1  --  8.6* 9.6  GFRNONAA >60  --  >60 >60  --   PROT  --  7.0  --  6.8 7.5  ALBUMIN  --   --   --  3.3*  --   AST  --  14  --  19 19  ALT  --  13  --  14 18  ALKPHOS  --   --   --  40  --   BILITOT  --  0.4  --  0.4 0.3    RADIOGRAPHIC STUDIES: I have personally reviewed the radiological images as listed and agreed with the findings in the report. No results found.  ASSESSMENT & PLAN:   Carcinoma of upper-outer quadrant of left breast in female, estrogen receptor positive ([place]) #Left breast cancer T65m0- G-2; ER/PR positive HER2 negative status BIL postmastectomy [positive for CHEK-2 mutation].  Oncotype recurrence score 16/LOW. On Tamoxifen [s/p hysterectomy [intact ovaries]; no role for mammograms. STABLE;  #Patient tolerated tamoxifen fairly well except for hot flashes.   # Hot flashes-grade 1 through 2- STABLE;  monitor for now.  If worse will consider pharmacologic therapy.  # DISPOSITION:  # follow up 6 months- MD; no labs- Dr.B   All questions were answered. The patient/family knows to call the clinic with any problems, questions or concerns.     GCammie Sickle MD 11/20/2021 3:20 PM

## 2021-11-20 NOTE — Progress Notes (Signed)
Patient denies new problems/concerns today.  Does have hot flashes taking the Tamoxifen.

## 2021-11-20 NOTE — Assessment & Plan Note (Addendum)
#  Left breast cancer T8m0- G-2; ER/PR positive HER2 negative status BIL postmastectomy [positive for CHEK-2 mutation].  Oncotype recurrence score 16/LOW. On Tamoxifen [s/p hysterectomy [intact ovaries]; no role for mammograms. STABLE;  #Patient tolerated tamoxifen fairly well except for hot flashes.   # Hot flashes-grade 1 through 2- STABLE;  monitor for now.  If worse will consider pharmacologic therapy.  # DISPOSITION:  # follow up 6 months- MD; no labs- Dr.B

## 2021-12-06 DIAGNOSIS — J301 Allergic rhinitis due to pollen: Secondary | ICD-10-CM | POA: Diagnosis not present

## 2021-12-06 DIAGNOSIS — J3089 Other allergic rhinitis: Secondary | ICD-10-CM | POA: Diagnosis not present

## 2021-12-06 DIAGNOSIS — J3081 Allergic rhinitis due to animal (cat) (dog) hair and dander: Secondary | ICD-10-CM | POA: Diagnosis not present

## 2021-12-07 DIAGNOSIS — J3089 Other allergic rhinitis: Secondary | ICD-10-CM | POA: Diagnosis not present

## 2021-12-07 DIAGNOSIS — J301 Allergic rhinitis due to pollen: Secondary | ICD-10-CM | POA: Diagnosis not present

## 2021-12-07 DIAGNOSIS — J3081 Allergic rhinitis due to animal (cat) (dog) hair and dander: Secondary | ICD-10-CM | POA: Diagnosis not present

## 2021-12-13 ENCOUNTER — Telehealth: Payer: Self-pay | Admitting: Physician Assistant

## 2021-12-13 DIAGNOSIS — Z719 Counseling, unspecified: Secondary | ICD-10-CM

## 2021-12-13 NOTE — Telephone Encounter (Signed)
Pt was called (by Claiborne Billings) lmom for her to give the office a call back in reference to the completed [employer] (FML[employer]) pw. She was called to let her know about the $25.00 form fee and ask her how she would like to take care of it.

## 2021-12-14 DIAGNOSIS — J3089 Other allergic rhinitis: Secondary | ICD-10-CM | POA: Diagnosis not present

## 2021-12-14 DIAGNOSIS — J301 Allergic rhinitis due to pollen: Secondary | ICD-10-CM | POA: Diagnosis not present

## 2021-12-14 DIAGNOSIS — J3081 Allergic rhinitis due to animal (cat) (dog) hair and dander: Secondary | ICD-10-CM | POA: Diagnosis not present

## 2021-12-18 ENCOUNTER — Telehealth: Payer: Self-pay | Admitting: Nurse Practitioner

## 2021-12-18 ENCOUNTER — Ambulatory Visit (INDEPENDENT_AMBULATORY_CARE_PROVIDER_SITE_OTHER): Payer: 59 | Admitting: Psychologist

## 2021-12-18 DIAGNOSIS — F4322 Adjustment disorder with anxiety: Secondary | ICD-10-CM

## 2021-12-18 NOTE — Progress Notes (Signed)
[facility] Counselor/Therapist Progress Note  Patient ID: Tracey Huff, MRN: [id number],    Date: 12/18/2021  Time Spent: 01:08 pm to 01:46 pm; total time: 38 minutes   This session was held via in person. The patient consented to in-person therapy and was in the clinician's office. Limits of confidentiality were discussed with the patient.   Treatment Type: Individual Therapy  Reported Symptoms: Difficulty with trust and expressing emotions  Mental Status Exam: Appearance:  Well Groomed     Behavior: Appropriate  Motor: Normal  Speech/Language:  Clear and Coherent  Affect: Appropriate  Mood: normal  Thought process: normal  Thought content:   WNL  Sensory/Perceptual disturbances:   WNL  Orientation: oriented to person, place, and time/date  Attention: Good  Concentration: Good  Memory: WNL  Fund of knowledge:  Good  Insight:   Fair  Judgment:  Good  Impulse Control: Good   Risk Assessment: Danger to Self:  No Self-injurious Behavior: No Danger to Others: No Duty to Warn:no Physical Aggression / Violence:No  Access to Firearms a concern: No  Gang Involvement:No   Subjective: Beginning the session, patient described herself as doing well and talked about what has been going well. From there, she acknowledged that she has a difficult time expressing emotions. She spent the session reflecting on barriers that make it hard to express emotions. Specifically, she identified lack of trust as a big barrier. She reflected on what assists her in building trust. Patient also spent time reflecting on barriers to expressing emotions. She voiced getting too "angry". She reflected on ways to express her anger in a therapeutic manner. She was agreeable to homework and following up. She denied suicidal and homicidal ideation.    Interventions:  Worked on developing a therapeutic relationship using active listening and reflective statements. Provided emotional support  using empathy and validation. Reviewed events since the last session. Normalized and validated expressed thoughts and emotions. Identified goals for the session. Identified several different themes in the session including using a facade, lack of trust, and difficult understanding how to express emotions. Used metaphors to assist the patient gain insight into self.  Used socratic questions to assist the patient gain insight into self. Explored the rapport with the therapist and how it assists the patient. Provided psychoeducation about podcast episode on emotions. Validated the patient's experience. Discussed next steps. Provided empathic statements. Assigned homework. Assessed for suicidal and homicidal ideation.   Homework: Listen to podcast episode on emotions   Next Session: Review homework and emotional support.   Diagnosis: F43.22 adjustment disorder with anxiety   Plan:   Goals Work through the grieving process and face reality of own death Accept emotional support from others around them Live life to the fullest, event though time may be limited Become as knowledgeable about the medical condition  Reduce fear, anxiety about the health condition  Accept the illness Accept the role of psychological and behavioral factors  Stabilize anxiety level wile increasing ability to function Learn and implement coping skills that result in a reduction of anxiety  Alleviate depressive symptoms Recognize, accept, and cope with depressive feelings Develop healthy thinking patterns Develop healthy interpersonal relationships  Objectives target date for all objectives is 07/10/2022 Identify feelings associated with the illness Family members share with each other feelings Identify the losses or limitations that have been experienced Verbalize acceptance of the reality of the medical condition Commit to learning and implement a proactive approach to managing personal stresses Verbalize an  understanding of the medical condition Work with therapist to develop a plan for coping with stress Learn and implement skills for managing stress Engage in social, productive activities that are possible Engage in faith based activities implement positive imagery Identify coping skills and sources of emotional support Patient's partner and family members verbalize their fears regarding severity of health condition Identify sources of emotional distress  Learning and implement calming skills to reduce overall anxiety Learn and implement problem solving strategies Identify and engage in pleasant activities Learning and implement personal and interpersonal skills to reduce anxiety and improve interpersonal relationships Learn to accept limitations in life and commit to tolerating, rather than avoiding, unpleasant emotions while accomplishing meaningful goals Identify major life conflicts from the past and present that form the basis for present anxiety Learn and implement behavioral strategies Verbalize an understanding and resolution of current interpersonal problems Learn and implement problem solving and decision making skills Learn and implement conflict resolution skills to resolve interpersonal problems Verbalize an understanding of healthy and unhealthy emotions verbalize insight into how past relationships may be influence current experiences with depression Use mindfulness and acceptance strategies and increase value based behavior  Increase hopeful statements about the future.   Interventions Teach about stress and ways to handle stress Assist the patient in developing a coping action plan for stressors Conduct skills based training for coping strategies Train problem focused skills Sort out what activities the individual can do Encourage patient to rely upon his/her spiritual faith Teach the patient to use guided imagery Probe and evaluate family's ability to provide emotional  support Allow family to share their fears Assist the patient in identifying, sorting through, and verbalizing the various feelings generated by his/her medical condition Meet with family members  Ask patient list out limitations  Use stress inoculation training  Use Acceptance and Commitment Therapy to help client accept uncomfortable realities in order to accomplish value-consistent goals Reinforce the client's insight into the role of his/her past emotional pain and present anxiety  Discuss examples demonstrating that unrealistic worry overestimates the probability of threats and underestimate patient's ability  Assist the patient in analyzing his or her worries Help patient understand that avoidance is reinforcing  Behavioral activation help the client explore the relationship, nature of the dispute,  Help the client develop new interpersonal skills and relationships Conduct Problem so living therapy Teach conflict resolution skills Use a process-experiential approach Conduct TLDP Conduct ACT  The patient and clinician reviewed the treatment plan on 08/07/2021. The patient approved of the treatment plan.   Conception Chancy, PsyD

## 2021-12-18 NOTE — Telephone Encounter (Unsigned)
Pt is scheduled   Copied from [place] [id number]. Topic: General - Other >> Dec 18, 2021 12:40 PM Ludger Nutting wrote: Patient needs to have FMLA paperwork filled out. Does she need to be seen first? Please advise patient.

## 2021-12-20 ENCOUNTER — Encounter: Payer: Self-pay | Admitting: Nurse Practitioner

## 2021-12-20 ENCOUNTER — Other Ambulatory Visit: Payer: Self-pay

## 2021-12-20 ENCOUNTER — Ambulatory Visit: Payer: 59 | Admitting: Nurse Practitioner

## 2021-12-20 VITALS — BP 112/76 | HR 92 | Temp 98.6°F | Resp 16 | Ht 60.0 in | Wt 183.8 lb

## 2021-12-20 DIAGNOSIS — J4530 Mild persistent asthma, uncomplicated: Secondary | ICD-10-CM | POA: Diagnosis not present

## 2021-12-20 DIAGNOSIS — I10 Essential (primary) hypertension: Secondary | ICD-10-CM | POA: Diagnosis not present

## 2021-12-20 DIAGNOSIS — E66811 Obesity, class 1: Secondary | ICD-10-CM

## 2021-12-20 DIAGNOSIS — Z6833 Body mass index (BMI) 33.0-33.9, adult: Secondary | ICD-10-CM

## 2021-12-20 DIAGNOSIS — E785 Hyperlipidemia, unspecified: Secondary | ICD-10-CM

## 2021-12-20 DIAGNOSIS — E6609 Other obesity due to excess calories: Secondary | ICD-10-CM

## 2021-12-20 DIAGNOSIS — J309 Allergic rhinitis, unspecified: Secondary | ICD-10-CM

## 2021-12-20 DIAGNOSIS — F4322 Adjustment disorder with anxiety: Secondary | ICD-10-CM

## 2021-12-20 DIAGNOSIS — C50412 Malignant neoplasm of upper-outer quadrant of left female breast: Secondary | ICD-10-CM

## 2021-12-20 DIAGNOSIS — Z170 Estrogen receptor positive status [ER+]: Secondary | ICD-10-CM

## 2021-12-20 DIAGNOSIS — R7303 Prediabetes: Secondary | ICD-10-CM

## 2021-12-20 NOTE — Progress Notes (Signed)
cons

## 2021-12-20 NOTE — Assessment & Plan Note (Signed)
Continue taking your atorvastatin 10 mg daily.  Continue to watch saturated fats in your diet.

## 2021-12-20 NOTE — Assessment & Plan Note (Signed)
Continue taking losartan 25 mg daily.  Blood pressure at goal.

## 2021-12-20 NOTE — Progress Notes (Signed)
BP 112/76   Pulse 92   Temp 98.6 F (37 C) (Oral)   Resp 16   Ht 5' (1.524 m)   Wt 183 lb 12.8 oz (83.4 kg)   LMP 11/15/2014   SpO2 98%   BMI 35.90 kg/m    Subjective:    Patient ID: Tracey Huff, female    DOB: [birth year], 51 y.o.   MRN: [id number]  HPI: Tracey Huff is a 51 y.o. female  Chief Complaint  Patient presents with   Consult   FMLA:  She multiple appointment she needs to go to starting on 12/18/2021-12/19/2022.  She says she needs FMLA for approximately 5 -10 days a month for appointments. Limitations: she is still not clear to lift, pull or push.   Anxiety: Started getting therapy this year.  [facility].  Patient reports she is once every 2 weeks.  States that she is doing better with therapy.  Breast ca: she sees plastic surgery twice a month, oncology is now every six months.   Allergies/asthma: She goes to allergy specialst for allergy shots weekly.  Also sees her allergist every 6 months.  She does currently use Pataday, Singulair, DuoNeb, Trelegy, Flonase, Astelin, and albuterol inhaler.  HTN: Blood pressure today is 112/76.  He is currently taking losartan 25 mg daily.  She denies any chest pain, shortness of breath, headaches or blurred vision.  Hyperlipidemia: She is currently taking atorvastatin 10 mg at bedtime.  Patient denies any myalgia.  Her last LDL was 168 on 11/07/2021.  Prediabetes: Her last A1c was 6.3 on 11/07/2021.  Patient denies any polyuria, polydipsia or polyphasia.  Obesity: Her current weight is 183 pounds with a BMI of 35.9.  Patient has been trying to eat healthier and get physical activity.  Patient states she does a lot of walking.  Relevant past medical, surgical, family and social history reviewed and updated as indicated. Interim medical history since our last visit reviewed. Allergies and medications reviewed and updated.  Review of Systems Constitutional: Negative for fever or weight change.   Respiratory: Negative for cough and shortness of breath.   Cardiovascular: Negative for chest pain or palpitations.  Gastrointestinal: Negative for abdominal pain, no bowel changes.  Musculoskeletal: Negative for gait problem or joint swelling.  Skin: Negative for rash.  Neurological: Negative for dizziness or headache.  No other specific complaints in a complete review of systems (except as listed in HPI above).      Objective:    BP 112/76   Pulse 92   Temp 98.6 F (37 C) (Oral)   Resp 16   Ht 5' (1.524 m)   Wt 183 lb 12.8 oz (83.4 kg)   LMP 11/15/2014   SpO2 98%   BMI 35.90 kg/m   Wt Readings from Last 3 Encounters:  12/20/21 183 lb 12.8 oz (83.4 kg)  11/20/21 186 lb 3.2 oz (84.5 kg)  11/07/21 184 lb 12.8 oz (83.8 kg)    Physical Exam  Constitutional: Patient appears well-developed and well-nourished. Obese  No distress.  HEENT: head atraumatic, normocephalic, pupils equal and reactive to light, neck supple, throat within normal limits Cardiovascular: Normal rate, regular rhythm and normal heart sounds.  No murmur heard. No BLE edema. Pulmonary/Chest: Effort normal and breath sounds normal. No respiratory distress. Abdominal: Soft.  There is no tenderness. Psychiatric: Patient has a normal mood and affect. behavior is normal. Judgment and thought content normal.  Results for orders placed or performed in  visit on 11/07/21  Lipid panel  Result Value Ref Range   Cholesterol 251 (H) <200 mg/dL   HDL 54 > OR = 50 mg/dL   Triglycerides 150 (H) <150 mg/dL   LDL Cholesterol (Calc) 168 (H) mg/dL (calc)   Total CHOL/HDL Ratio 4.6 <5.0 (calc)   Non-HDL Cholesterol (Calc) 197 (H) <130 mg/dL (calc)  CBC with Differential/Platelet  Result Value Ref Range   WBC 7.4 3.8 - 10.8 Thousand/uL   RBC 4.51 3.80 - 5.10 Million/uL   Hemoglobin 12.3 11.7 - 15.5 g/dL   HCT 37.6 35.0 - 45.0 %   MCV 83.4 80.0 - 100.0 fL   MCH 27.3 27.0 - 33.0 pg   MCHC 32.7 32.0 - 36.0 g/dL   RDW 14.2  11.0 - 15.0 %   Platelets 379 140 - 400 Thousand/uL   MPV 9.8 7.5 - 12.5 fL   Neutro Abs 4,092 1,500 - 7,800 cells/uL   Lymphs Abs 2,538 850 - 3,900 cells/uL   Absolute Monocytes 518 200 - 950 cells/uL   Eosinophils Absolute 200 15 - 500 cells/uL   Basophils Absolute 52 0 - 200 cells/uL   Neutrophils Relative % 55.3 %   Total Lymphocyte 34.3 %   Monocytes Relative 7.0 %   Eosinophils Relative 2.7 %   Basophils Relative 0.7 %  COMPLETE METABOLIC PANEL WITH GFR  Result Value Ref Range   Glucose, Bld 107 (H) 65 - 99 mg/dL   BUN 14 7 - 25 mg/dL   Creat 0.87 0.50 - 1.03 mg/dL   eGFR 81 > OR = 60 mL/min/1.43m2   BUN/Creatinine Ratio NOT APPLICABLE 6 - 22 (calc)   Sodium 140 135 - 146 mmol/L   Potassium 4.5 3.5 - 5.3 mmol/L   Chloride 106 98 - 110 mmol/L   CO2 23 20 - 32 mmol/L   Calcium 9.6 8.6 - 10.4 mg/dL   Total Protein 7.5 6.1 - 8.1 g/dL   Albumin 4.2 3.6 - 5.1 g/dL   Globulin 3.3 1.9 - 3.7 g/dL (calc)   AG Ratio 1.3 1.0 - 2.5 (calc)   Total Bilirubin 0.3 0.2 - 1.2 mg/dL   Alkaline phosphatase (APISO) 47 37 - 153 U/L   AST 19 10 - 35 U/L   ALT 18 6 - 29 U/L  Hemoglobin A1c  Result Value Ref Range   Hgb A1c MFr Bld 6.3 (H) <5.7 % of total Hgb   Mean Plasma Glucose 134 mg/dL   eAG (mmol/L) 7.4 mmol/L      Assessment & Plan:   Problem List Items Addressed This Visit       Cardiovascular and Mediastinum   Essential hypertension    Continue taking losartan 25 mg daily.  Blood pressure at goal.        Respiratory   Allergic rhinitis    Continue with allergy shots.  Continue taking meds such as Pataday, Singulair, DuoNeb, Trelegy, Flonase, Astelin and albuterol inhaler.      Mild persistent asthma, uncomplicated    Continue with allergy shots.  Continue taking meds such as Pataday, Singulair, DuoNeb, Trelegy, Flonase, Astelin and albuterol inhaler.        Other   Class 1 obesity due to excess calories with serious comorbidity and body mass index (BMI) of 33.0 to  33.9 in adult    Continue working on physical activity as tolerated and working on [employer].      Hyperlipidemia    Continue taking your atorvastatin 10 mg daily.  Continue  to watch saturated fats in your diet.      Prediabetes    Continue with follow-up appointments.  Continue to work on diet and physical activity.      Carcinoma of upper-outer quadrant of left breast in female, estrogen receptor positive ([place]) - Primary    Patient does not require chemotherapy or radiation per patient.  Patient states she sees oncology every 6 months now and plastic surgery twice a month.       Persistent adjustment disorder with anxiety    Patient is currently in therapy.  She does have a prescription of hydroxyzine to take at bedtime and BuSpar 7.5 mg as needed.       Patient will send FMLA paperwork.  When we receive it we will fill it out. Follow up plan: Return if symptoms worsen or fail to improve.

## 2021-12-20 NOTE — Assessment & Plan Note (Signed)
Patient does not require chemotherapy or radiation per patient.  Patient states she sees oncology every 6 months now and plastic surgery twice a month.

## 2021-12-20 NOTE — Assessment & Plan Note (Signed)
Continue with follow-up appointments.  Continue to work on diet and physical activity.

## 2021-12-20 NOTE — Assessment & Plan Note (Signed)
Continue with allergy shots.  Continue taking meds such as Pataday, Singulair, DuoNeb, Trelegy, Flonase, Astelin and albuterol inhaler.

## 2021-12-20 NOTE — Assessment & Plan Note (Signed)
Patient is currently in therapy.  She does have a prescription of hydroxyzine to take at bedtime and BuSpar 7.5 mg as needed.

## 2021-12-20 NOTE — Assessment & Plan Note (Signed)
Continue working on physical activity as tolerated and working on [employer].

## 2021-12-21 ENCOUNTER — Encounter: Payer: Self-pay | Admitting: Nurse Practitioner

## 2021-12-21 DIAGNOSIS — J3089 Other allergic rhinitis: Secondary | ICD-10-CM | POA: Diagnosis not present

## 2021-12-21 DIAGNOSIS — J301 Allergic rhinitis due to pollen: Secondary | ICD-10-CM | POA: Diagnosis not present

## 2021-12-21 DIAGNOSIS — J3081 Allergic rhinitis due to animal (cat) (dog) hair and dander: Secondary | ICD-10-CM | POA: Diagnosis not present

## 2021-12-28 DIAGNOSIS — J301 Allergic rhinitis due to pollen: Secondary | ICD-10-CM | POA: Diagnosis not present

## 2021-12-28 DIAGNOSIS — J3089 Other allergic rhinitis: Secondary | ICD-10-CM | POA: Diagnosis not present

## 2021-12-28 DIAGNOSIS — J3081 Allergic rhinitis due to animal (cat) (dog) hair and dander: Secondary | ICD-10-CM | POA: Diagnosis not present

## 2021-12-31 ENCOUNTER — Ambulatory Visit (INDEPENDENT_AMBULATORY_CARE_PROVIDER_SITE_OTHER): Payer: 59 | Admitting: Surgical

## 2021-12-31 DIAGNOSIS — C50412 Malignant neoplasm of upper-outer quadrant of left female breast: Secondary | ICD-10-CM

## 2021-12-31 DIAGNOSIS — Z9889 Other specified postprocedural states: Secondary | ICD-10-CM

## 2021-12-31 DIAGNOSIS — Z170 Estrogen receptor positive status [ER+]: Secondary | ICD-10-CM

## 2021-12-31 NOTE — Progress Notes (Signed)
Patient is a 50 year old female here for follow-up on her bilateral breast reconstruction.  She most recently underwent removal of bilateral breast tissue expanders with placement of bilateral breast implants with Dr. Claudia Desanctis on 10/02/2021.  She presents today to discuss bilateral nipple areola tattooing as well as to discuss fat grafting to bilateral breast.  She had [employer]or memory gel extra high-profile 560 cc implants placed bilaterally.  She reports overall she is doing really well, does report some sensory changes in her left hand with positional changes, specifically when resting on her left breast.  She reports it feels similar to when one of your limbs "falls asleep".    Chaperone present on exam On exam bilateral breast incisions intact, well-healed.  She has good symmetry bilaterally, bilateral breast capsules are soft.  No subcutaneous fluid collections noted.  No erythema noted.  She does have some very minor volume loss in the superior medial poles of bilateral breast, left greater than right.  She also has a indentation along the left lateral breast that is causing some flattening.  We discussed fat grafting to bilateral breasts is possible for improved contour, she would like to schedule a consultation with one of our surgeons to discuss.  She is going to schedule a consultation today.  We discussed bilateral nipple areolar tattooing, we discussed that she could undergo tattooing prior to or after fat grafting, I do not feel as if the fat grafting will cause much shape change and should not affect tattoo location.  We discussed initial appointment would be approximately 2 to 3 hours to allow for positioning, color selection and instilling first layer of nipple areola tattooing.  She denies any history of keloiding.  She is not on blood thinners.  She is going to plan to have a consultation with one of our surgeons in the next few weeks to discuss fat grafting to bilateral breasts and then  subsequently schedule tattooing depending on consultation.  Pictures were taken and placed in the patient's chart with patient's permission.

## 2022-01-02 DIAGNOSIS — Z9011 Acquired absence of right breast and nipple: Secondary | ICD-10-CM | POA: Diagnosis not present

## 2022-01-02 DIAGNOSIS — C50412 Malignant neoplasm of upper-outer quadrant of left female breast: Secondary | ICD-10-CM | POA: Diagnosis not present

## 2022-01-03 ENCOUNTER — Encounter: Payer: Self-pay | Admitting: Gastroenterology

## 2022-01-03 ENCOUNTER — Other Ambulatory Visit (HOSPITAL_COMMUNITY): Payer: Self-pay

## 2022-01-03 DIAGNOSIS — Z9011 Acquired absence of right breast and nipple: Secondary | ICD-10-CM | POA: Diagnosis not present

## 2022-01-03 DIAGNOSIS — C50412 Malignant neoplasm of upper-outer quadrant of left female breast: Secondary | ICD-10-CM | POA: Diagnosis not present

## 2022-01-04 ENCOUNTER — Encounter
Admission: RE | Disposition: A | Discharge status: Home / Self Care | Payer: Self-pay | Source: Ambulatory Visit | Attending: Gastroenterology

## 2022-01-04 ENCOUNTER — Ambulatory Visit: Payer: 59 | Admitting: Anesthesiology

## 2022-01-04 ENCOUNTER — Encounter: Payer: Self-pay | Admitting: Gastroenterology

## 2022-01-04 ENCOUNTER — Ambulatory Visit
Admission: RE | Admit: 2022-01-04 | Discharge: 2022-01-04 | Disposition: A | Discharge status: Home / Self Care | Payer: 59 | Source: Ambulatory Visit | Attending: Gastroenterology | Admitting: Gastroenterology

## 2022-01-04 DIAGNOSIS — Z853 Personal history of malignant neoplasm of breast: Secondary | ICD-10-CM | POA: Insufficient documentation

## 2022-01-04 DIAGNOSIS — Z90710 Acquired absence of both cervix and uterus: Secondary | ICD-10-CM | POA: Insufficient documentation

## 2022-01-04 DIAGNOSIS — K5730 Diverticulosis of large intestine without perforation or abscess without bleeding: Secondary | ICD-10-CM | POA: Diagnosis not present

## 2022-01-04 DIAGNOSIS — I10 Essential (primary) hypertension: Secondary | ICD-10-CM | POA: Insufficient documentation

## 2022-01-04 DIAGNOSIS — Z9013 Acquired absence of bilateral breasts and nipples: Secondary | ICD-10-CM | POA: Insufficient documentation

## 2022-01-04 DIAGNOSIS — Z1211 Encounter for screening for malignant neoplasm of colon: Secondary | ICD-10-CM

## 2022-01-04 DIAGNOSIS — J45909 Unspecified asthma, uncomplicated: Secondary | ICD-10-CM | POA: Diagnosis not present

## 2022-01-04 DIAGNOSIS — Z9882 Breast implant status: Secondary | ICD-10-CM | POA: Diagnosis not present

## 2022-01-04 DIAGNOSIS — Z6835 Body mass index (BMI) 35.0-35.9, adult: Secondary | ICD-10-CM | POA: Insufficient documentation

## 2022-01-04 DIAGNOSIS — R7303 Prediabetes: Secondary | ICD-10-CM | POA: Insufficient documentation

## 2022-01-04 DIAGNOSIS — E785 Hyperlipidemia, unspecified: Secondary | ICD-10-CM | POA: Insufficient documentation

## 2022-01-04 DIAGNOSIS — F419 Anxiety disorder, unspecified: Secondary | ICD-10-CM | POA: Diagnosis not present

## 2022-01-04 HISTORY — PX: COLONOSCOPY WITH PROPOFOL: SHX5780

## 2022-01-04 LAB — GLUCOSE, CAPILLARY: Glucose-Capillary: 124 mg/dL — ABNORMAL HIGH (ref 70–99)

## 2022-01-04 SURGERY — COLONOSCOPY WITH PROPOFOL
Anesthesia: General

## 2022-01-04 MED ORDER — MIDAZOLAM HCL 5 MG/5ML IJ SOLN
INTRAMUSCULAR | Status: DC | PRN
  Administered 2022-01-04: 2 mg via INTRAVENOUS

## 2022-01-04 MED ORDER — PROPOFOL 10 MG/ML IV BOLUS
INTRAVENOUS | Status: DC | PRN
  Administered 2022-01-04: 30 mg via INTRAVENOUS
  Administered 2022-01-04: 70 mg via INTRAVENOUS

## 2022-01-04 MED ORDER — MIDAZOLAM HCL 2 MG/2ML IJ SOLN
INTRAMUSCULAR | Status: AC
  Filled 2022-01-04: qty 2

## 2022-01-04 MED ORDER — SODIUM CHLORIDE 0.9 % IV SOLN: INTRAVENOUS | Status: DC

## 2022-01-04 MED ORDER — LIDOCAINE HCL (PF) 2 % IJ SOLN
INTRAMUSCULAR | Status: AC
  Filled 2022-01-04: qty 5

## 2022-01-04 MED ORDER — PROPOFOL 500 MG/50ML IV EMUL
INTRAVENOUS | Status: DC | PRN
  Administered 2022-01-04: 120 ug/kg/min via INTRAVENOUS

## 2022-01-04 MED ORDER — GLYCOPYRROLATE 0.2 MG/ML IJ SOLN
INTRAMUSCULAR | Status: AC
  Filled 2022-01-04: qty 1

## 2022-01-04 MED ORDER — LIDOCAINE 2% (20 MG/ML) 5 ML SYRINGE
INTRAMUSCULAR | Status: DC | PRN
  Administered 2022-01-04: 20 mg via INTRAVENOUS

## 2022-01-04 MED ORDER — PROPOFOL 1000 MG/100ML IV EMUL
INTRAVENOUS | Status: AC
  Filled 2022-01-04: qty 100

## 2022-01-04 NOTE — Transfer of Care (Signed)
Immediate Anesthesia Transfer of Care Note  Patient: Tracey Huff  Procedure(s) Performed: COLONOSCOPY WITH PROPOFOL  Patient Location: PACU  Anesthesia Type:General  Level of Consciousness: sedated  Airway & Oxygen Therapy: Patient Spontanous Breathing  Post-op Assessment: Report given to RN and Post -op Vital signs reviewed and stable  Post vital signs: Reviewed  Last Vitals:  Vitals Value Taken Time  BP    Temp    Pulse    Resp    SpO2      Last Pain: There were no vitals filed for this visit.       Complications: No notable events documented.

## 2022-01-04 NOTE — Anesthesia Postprocedure Evaluation (Signed)
Anesthesia Post Note  Patient: Tracey Huff  Procedure(s) Performed: COLONOSCOPY WITH PROPOFOL  Patient location during evaluation: PACU Anesthesia Type: General Level of consciousness: awake and awake and alert Pain management: satisfactory to patient Vital Signs Assessment: post-procedure vital signs reviewed and stable Respiratory status: spontaneous breathing and nonlabored ventilation Cardiovascular status: stable Anesthetic complications: no   No notable events documented.   Last Vitals:  Vitals:   01/04/22 0816 01/04/22 0826  BP: 114/70 126/73  Pulse: 60 66  Resp: (!) 23 15  Temp:    SpO2: 100% 100%    Last Pain:  Vitals:   01/04/22 0806  TempSrc: Temporal                 VAN STAVEREN,Kristyanna Barcelo

## 2022-01-04 NOTE — Op Note (Signed)
[facility] Gastroenterology Patient Name: Tracey Huff Procedure Date: 01/04/2022 7:42 AM MRN: [id number] Account #: [id number] Date of Birth: 03/02/1971 Admit Type: Outpatient Age: 51 Room: [facility] ENDO ROOM 4 Gender: Female Note Status: Finalized Instrument Name: Park Meo [id number] Procedure:             Colonoscopy Indications:           Screening for colorectal malignant neoplasm Providers:             [name] MD, MD Referring MD:          Myna Hidalgo. Reece Packer (Referring MD) Medicines:             Monitored Anesthesia Care Complications:         No immediate complications. Procedure:             Pre-Anesthesia Assessment:                        - Prior to the procedure, a History and Physical was                         performed, and patient medications, allergies and                         sensitivities were reviewed. [name] tolerance                         of previous anesthesia was reviewed.                        - The risks and benefits of the procedure and the                         sedation options and risks were discussed with the                         patient. All questions were answered and informed                         consent was obtained.                        - ASA Grade Assessment: II - A patient with mild                         systemic disease.                        After obtaining informed consent, the colonoscope was                         passed under direct vision. Throughout the procedure,                         the patient's blood pressure, pulse, and oxygen                         saturations were monitored continuously. The                         Colonoscope was introduced  through the anus and                         advanced to the the cecum, identified by the                         appendiceal orifice. The colonoscopy was performed                         with ease. The patient tolerated the procedure well.                          The quality of the bowel preparation was excellent. Findings:      The perianal and digital rectal examinations were normal.      Multiple small-mouthed diverticula were found in the sigmoid colon.      The exam was otherwise without abnormality on direct and retroflexion       views. Impression:            - Diverticulosis in the sigmoid colon.                        - The examination was otherwise normal on direct and                         retroflexion views.                        - No specimens collected. Recommendation:        - Discharge patient to home (with escort).                        - Resume previous diet.                        - Continue present medications.                        - Repeat colonoscopy in 10 years for screening                         purposes. Procedure Code(s):     --- Professional ---                        [phone], Colonoscopy, flexible; diagnostic, including                         collection of specimen(s) by brushing or washing, when                         performed (separate procedure) Diagnosis Code(s):     --- Professional ---                        Z12.11, Encounter for screening for malignant neoplasm                         of colon                        K57.30, Diverticulosis of large intestine without  perforation or abscess without bleeding CPT copyright 2019 American Medical Association. All rights reserved. The codes documented in this report are preliminary and upon coder review may  be revised to meet current compliance requirements. [name], MD [name] MD, MD 01/04/2022 8:01:25 AM This report has been signed electronically. Number of Addenda: 0 Note Initiated On: 01/04/2022 7:42 AM Scope Withdrawal Time: 0 hours 4 minutes 59 seconds  Estimated Blood Loss:  Estimated blood loss: none.      [facility]

## 2022-01-04 NOTE — H&P (Signed)
[name], MD [address], [place], [place], [place], [address] [id number], [place], [place], [place], [address] Phone: [phone]  Fax: [phone]  Primary Care Physician:  Bo Merino, FNP   Pre-Procedure History & Physical: HPI:  Tracey Huff is a 51 y.o. female is here for an colonoscopy.   Past Medical History:  Diagnosis Date   Allergy    Anxiety    Asthma    Breast cancer ([place])    INVASIVE MAMMARY CARCINOMA- left   Family history of prostate cancer    Fibroid uterus 11/15/2014   Hyperlipidemia    Hypertension    Menorrhagia with irregular cycle 11/15/2014   Pneumonia    Pre-diabetes    Shortness of breath dyspnea     Past Surgical History:  Procedure Laterality Date   ABDOMINAL HYSTERECTOMY N/A 11/15/2014   Procedure: HYSTERECTOMY ABDOMINAL/BILATERAL SALPINGECTOMY ;  Surgeon: Will Bonnet, MD;  Location: [facility];  Service: Gynecology;  Laterality: N/A;   BREAST BIOPSY Right    2019 negative   BREAST BIOPSY Left 03/09/2021   u/s biopsy, 12-1 o'clock, VENUS" clip-path pending   BREAST LUMPECTOMY,RADIO FREQ LOCALIZER,AXILLARY SENTINEL LYMPH NODE BIOPSY Left 03/28/2021   Procedure: BREAST LUMPECTOMY,RADIO FREQ LOCALIZER,AXILLARY SENTINEL LYMPH NODE BIOPSY;  Surgeon: Jules Husbands, MD;  Location: [facility];  Service: General;  Laterality: Left;   BREAST RECONSTRUCTION WITH PLACEMENT OF TISSUE EXPANDER AND FLEX HD (ACELLULAR HYDRATED DERMIS) Bilateral 05/15/2021   Procedure: BILATERAL BREAST RECONSTRUCTION WITH PLACEMENT OF TISSUE EXPANDER AND FLEX HD (ACELLULAR HYDRATED DERMIS);  Surgeon: Cindra Presume, MD;  Location: [facility];  Service: Plastics;  Laterality: Bilateral;   [place]   COLPOSCOPY  07/28/2018   CYSTOSCOPY N/A 11/15/2014   Procedure: CYSTOSCOPY;  Surgeon: Will Bonnet, MD;  Location: [facility];  Service: Gynecology;  Laterality: N/A;   DILATION AND CURETTAGE OF UTERUS     GANGLION CYST EXCISION Left     REMOVAL OF BILATERAL TISSUE EXPANDERS WITH PLACEMENT OF BILATERAL BREAST IMPLANTS Bilateral 10/02/2021   Procedure: REMOVAL OF BILATERAL TISSUE EXPANDERS WITH PLACEMENT OF BILATERAL BREAST IMPLANTS;  Surgeon: Cindra Presume, MD;  Location: [facility];  Service: Plastics;  Laterality: Bilateral;   TOTAL MASTECTOMY Bilateral 05/15/2021   Procedure: TOTAL MASTECTOMY, Bilateral Simple Mastectomy, skin sparing;  Surgeon: Jules Husbands, MD;  Location: [facility];  Service: General;  Laterality: Bilateral;  Provider requesting for 2 hours / 120 min for procedure.   UTERINE FIBROID SURGERY      Prior to Admission medications   Medication Sig Start Date End Date Taking? Authorizing Provider  albuterol (PROVENTIL HFA) 108 (90 Base) MCG/ACT inhaler Inhale 1 to 2 puffs every 4-6 hours as needed for cough/wheeze 11/28/20  Yes   atorvastatin (LIPITOR) 20 MG tablet Take 1 tablet (20 mg total) by mouth at bedtime. Patient taking differently: Take 10 mg by mouth every other day. Bedtime 12/28/19  Yes Delsa Grana, PA-C  ezetimibe (ZETIA) 10 MG tablet Take 1 tablet (10 mg total) by mouth daily. 11/09/21  Yes Bo Merino, FNP  ibuprofen (ADVIL) 200 MG tablet Take 400 mg by mouth every 6 (six) hours as needed for moderate pain.   Yes [provider]  losartan (COZAAR) 25 MG tablet Take 1 tablet (25 mg total) by mouth every morning. 05/09/21  Yes Bo Merino, FNP  pregabalin (LYRICA) 50 MG capsule Take 1 capsule (50 mg total) by mouth 3 (three)  times daily. 04/11/21  Yes Pabon, Diego F, MD  tamoxifen (NOLVADEX) 20 MG tablet Take 1 tablet (20 mg total) by mouth daily. 09/12/21  Yes Cammie Sickle, MD  Ascorbic Acid (VITAMIN C) 100 MG tablet Take 100 mg by mouth daily.    [provider]  AUVI-Q 0.3 MG/0.3ML SOAJ injection Inject 0.3 mg into the muscle as needed for anaphylaxis. 02/22/21   [provider]  azelastine (ASTELIN) 0.1 % nasal spray Place 1-2 sprays into both  nostrils 2 (two) times daily. Patient taking differently: Place 1-2 sprays into both nostrils daily as needed for rhinitis or allergies. 03/01/21     busPIRone (BUSPAR) 7.5 MG tablet Take 1 tablet once daily at night; may take 1 additional tablet daily if needed for anxiety. Patient taking differently: Take 7.5 mg by mouth as needed. Take 1 tablet once daily at night; may take 1 additional tablet daily if needed for anxiety. 12/28/18   Hubbard Hartshorn, FNP  Cholecalciferol 25 MCG (1000 UT) capsule Take 1,000 Units by mouth daily.    [provider]  diphenhydrAMINE (BENADRYL) 2 % cream Apply 1 application topically 2 (two) times daily as needed for itching.    [provider]  ELDERBERRY PO Take 1 capsule by mouth daily.    [provider]  fluticasone (FLONASE) 50 MCG/ACT nasal spray Place 1-2 sprays into both nostrils daily. Patient taking differently: Place 1-2 sprays into both nostrils daily as needed for allergies or rhinitis. 03/01/21     Fluticasone-Umeclidin-Vilant (TRELEGY ELLIPTA) 200-62.5-25 MCG/ACT AEPB Inhale 1 puff into the lungs daily. Patient taking differently: Inhale 1 puff into the lungs every morning. 03/01/21     hydrocortisone cream 1 % Apply 1 application topically daily as needed for itching.    [provider]  hydrOXYzine (VISTARIL) 25 MG capsule TAKE 1 CAPSULE (25 MG TOTAL) BY MOUTH AT BEDTIME AS NEEDED FOR UP TO 30 DOSES (DIFFICULTY SLEEPING). 08/27/21   Corena Herter, PA-C  ipratropium-albuterol (DUONEB) 0.5-2.5 (3) MG/3ML SOLN Take 3 mLs by nebulization every 6 (six) hours as needed (wheeze, SOB, cough variant asthma). 11/10/20   Delsa Grana, PA-C  levocetirizine (XYZAL) 5 MG tablet Take 1 tablet (5 mg total) by mouth every evening. 03/01/21     montelukast (SINGULAIR) 10 MG tablet Take 1 tablet (10 mg total) by mouth every evening. 03/01/21     Olopatadine HCl (PATADAY) 0.2 % SOLN Place 1 drop into affected eye daily. Patient taking  differently: Place 1 drop into both eyes daily as needed (itching). 03/01/21     ondansetron (ZOFRAN-ODT) 4 MG disintegrating tablet Take 1 tablet (4 mg total) by mouth every 8 (eight) hours as needed for nausea or vomiting. 09/17/21   Corena Herter, PA-C    Allergies as of 11/08/2021 - Review Complete 11/07/2021  Allergen Reaction Noted   Macadamia nut oil Shortness Of Breath 11/15/2014   Apple juice Nausea And Vomiting 11/15/2014   B[place]ivia nut (berthollefia excelsa) skin test Nausea And Vomiting and Swelling 03/23/2021   Carrot oil  12/27/2019   Fruit & vegetable daily [nutritional supplements] Nausea And Vomiting 11/09/2014   Kiwi extract Nausea And Vomiting 11/15/2014   Peach [prunus persica] Nausea And Vomiting 11/15/2014    Family History  Problem Relation Age of Onset   Congestive Heart Failure Mother    Diabetes Mother    Hypertension Mother    Diabetes Maternal Grandmother    Congestive Heart Failure Maternal Grandfather    Breast  cancer Neg Hx     Social History   Socioeconomic History   Marital status: Married    Spouse name: Fritz Pickerel   Number of children: 2   Years of education: Not on file   Highest education level: Not on file  Occupational History   Not on file  Tobacco Use   Smoking status: Never    Passive exposure: Never   Smokeless tobacco: Never  Vaping Use   Vaping Use: Never used  Substance and Sexual Activity   Alcohol use: Not Currently    Comment: occassional   Drug use: No   Sexual activity: Yes    Partners: Male    Birth control/protection: Surgical  Other Topics Concern   Not on file  Social History Narrative   Works for [employer]; never smoked; no alcohol; 2 children [25 and 31y- at 2022]. Lives with husband.    Social Determinants of Health   Financial Resource Strain: Low Risk  (07/09/2021)   Overall Financial Resource Strain (CARDIA)    Difficulty of Paying Living Expenses: Not very hard  Food Insecurity: No Food Insecurity  (07/09/2021)   Hunger Vital Sign    Worried About Running Out of Food in the Last Year: Never true    Ran Out of Food in the Last Year: Never true  Transportation Needs: No Transportation Needs (07/09/2021)   PRAPARE - Hydrologist (Medical): No    Lack of Transportation (Non-Medical): No  Physical Activity: Inactive (07/09/2021)   Exercise Vital Sign    Days of Exercise per Week: 0 days    Minutes of Exercise per Session: 0 min  Stress: Stress Concern Present (07/09/2021)   [place]    Feeling of Stress : To some extent  Social Connections: Socially Integrated (07/09/2021)   Social Connection and Isolation Panel [NHANES]    Frequency of Communication with Friends and Family: Three times a week    Frequency of Social Gatherings with Friends and Family: Three times a week    Attends Religious Services: More than 4 times per year    Active Member of Clubs or Organizations: Yes    Attends Archivist Meetings: 1 to 4 times per year    Marital Status: Married  Human resources officer Violence: Unknown (07/09/2021)   Humiliation, Afraid, Rape, and Kick questionnaire    Fear of Current or Ex-Partner: Patient refused    Emotionally Abused: Patient refused    Physically Abused: Patient refused    Sexually Abused: Patient refused    Review of Systems: See HPI, otherwise negative ROS  Physical Exam: BP 137/86   Pulse 68   Temp (!) 97 F (36.1 C)   Resp 16   Ht 5' (1.524 m)   Wt 83 kg   LMP 11/15/2014   SpO2 100%   BMI 35.74 kg/m  General:   Alert,  pleasant and cooperative in NAD Head:  Normocephalic and atraumatic. Neck:  Supple; no masses or thyromegaly. Lungs:  Clear throughout to auscultation, normal respiratory effort.    Heart:  +S1, +S2, Regular rate and rhythm, No edema. Abdomen:  Soft, nontender and nondistended. Normal bowel sounds, without guarding, and without rebound.    Neurologic:  Alert and  oriented x4;  grossly normal neurologically.  Impression/Plan: Tracey Huff is here for an colonoscopy to be performed for Screening colonoscopy average risk   Risks, benefits, limitations, and alternatives regarding  colonoscopy have  been reviewed with the patient.  Questions have been answered.  All parties agreeable.   [name], MD  01/04/2022, 7:40 AM

## 2022-01-04 NOTE — Anesthesia Preprocedure Evaluation (Signed)
Anesthesia Evaluation  Patient identified by MRN, date of birth, ID band Patient awake    Reviewed: Allergy & Precautions, NPO status , Patient's Chart, lab work & pertinent test results  Airway Mallampati: II  TM Distance: >3 FB Neck ROM: Full    Dental  (+) Teeth Intact   Pulmonary neg pulmonary ROS, asthma ,    Pulmonary exam normal  + decreased breath sounds      Cardiovascular Exercise Tolerance: Good hypertension, Pt. on medications negative cardio ROS Normal cardiovascular exam Rhythm:Regular     Neuro/Psych Anxiety negative neurological ROS  negative psych ROS   GI/Hepatic negative GI ROS, Neg liver ROS,   Endo/Other  negative endocrine ROSMorbid obesity  Renal/GU negative Renal ROS  negative genitourinary   Musculoskeletal   Abdominal (+) + obese,   Peds negative pediatric ROS (+)  Hematology negative hematology ROS (+)   Anesthesia Other Findings Past Medical History: No date: Allergy No date: Anxiety No date: Asthma No date: Breast cancer ([place])     Comment:  INVASIVE MAMMARY CARCINOMA- left No date: Family history of prostate cancer 11/15/2014: Fibroid uterus No date: Hyperlipidemia No date: Hypertension 11/15/2014: Menorrhagia with irregular cycle No date: Pneumonia No date: Pre-diabetes No date: Shortness of breath dyspnea  Past Surgical History: 11/15/2014: ABDOMINAL HYSTERECTOMY; N/A     Comment:  Procedure: HYSTERECTOMY ABDOMINAL/BILATERAL               SALPINGECTOMY ;  Surgeon: Will Bonnet, MD;                Location: [facility];  Service: Gynecology;  Laterality:               N/A; No date: BREAST BIOPSY; Right     Comment:  2019 negative 03/09/2021: BREAST BIOPSY; Left     Comment:  u/s biopsy, 12-1 o'clock, VENUS" clip-path pending 03/28/2021: BREAST LUMPECTOMY,RADIO FREQ LOCALIZER,AXILLARY SENTINEL  LYMPH NODE BIOPSY; Left     Comment:  Procedure: BREAST LUMPECTOMY,RADIO  FREQ               LOCALIZER,AXILLARY SENTINEL LYMPH NODE BIOPSY;  Surgeon:               Jules Husbands, MD;  Location: [facility];  Service:               General;  Laterality: Left; 05/15/2021: BREAST RECONSTRUCTION WITH PLACEMENT OF TISSUE EXPANDER  AND FLEX HD (ACELLULAR HYDRATED DERMIS); Bilateral     Comment:  Procedure: BILATERAL BREAST RECONSTRUCTION WITH               PLACEMENT OF TISSUE EXPANDER AND FLEX HD (ACELLULAR               HYDRATED DERMIS);  Surgeon: Cindra Presume, MD;                Location: [facility];  Service: Plastics;  Laterality:               Bilateral; 1990: CESAREAN SECTION 07/28/2018: COLPOSCOPY 11/15/2014: CYSTOSCOPY; N/A     Comment:  Procedure: CYSTOSCOPY;  Surgeon: Will Bonnet, MD;               Location: [facility];  Service: Gynecology;  Laterality:               N/A; No date: DILATION AND CURETTAGE OF UTERUS No date: GANGLION CYST EXCISION; Left 10/02/2021: REMOVAL OF BILATERAL TISSUE EXPANDERS WITH PLACEMENT OF  BILATERAL  BREAST IMPLANTS; Bilateral     Comment:  Procedure: REMOVAL OF BILATERAL TISSUE EXPANDERS WITH               PLACEMENT OF BILATERAL BREAST IMPLANTS;  Surgeon: Cindra Presume, MD;  Location: [facility];                Service: Plastics;  Laterality: Bilateral; 05/15/2021: TOTAL MASTECTOMY; Bilateral     Comment:  Procedure: TOTAL MASTECTOMY, Bilateral Simple               Mastectomy, skin sparing;  Surgeon: Jules Husbands, MD;                Location: [facility];  Service: General;  Laterality:               Bilateral;  Provider requesting for 2 hours / 120 min for              procedure. No date: UTERINE FIBROID SURGERY  BMI    Body Mass Index: 35.74 kg/m      Reproductive/Obstetrics negative OB ROS                             Anesthesia Physical Anesthesia Plan  ASA: 3  Anesthesia Plan: General   Post-op Pain Management:    Induction: Intravenous  PONV Risk  Score and Plan: Propofol infusion and TIVA  Airway Management Planned: Natural Airway  Additional Equipment:   Intra-op Plan:   Post-operative Plan:   Informed Consent: I have reviewed the patients History and Physical, chart, labs and discussed the procedure including the risks, benefits and alternatives for the proposed anesthesia with the patient or authorized representative who has indicated his/her understanding and acceptance.     Dental Advisory Given  Plan Discussed with: CRNA and Surgeon  Anesthesia Plan Comments:         Anesthesia Quick Evaluation

## 2022-01-08 ENCOUNTER — Encounter: Payer: Self-pay | Admitting: Gastroenterology

## 2022-01-10 ENCOUNTER — Ambulatory Visit (INDEPENDENT_AMBULATORY_CARE_PROVIDER_SITE_OTHER): Payer: 59 | Admitting: Psychologist

## 2022-01-10 DIAGNOSIS — F4322 Adjustment disorder with anxiety: Secondary | ICD-10-CM | POA: Diagnosis not present

## 2022-01-10 NOTE — Progress Notes (Signed)
[facility] Counselor/Therapist Progress Note  Patient ID: Tracey Huff, MRN: [id number],    Date: 01/10/2022  Time Spent: 11:03 am to 11:41 am; total time: 38 minutes   This session was held via in person. The patient consented to in-person therapy and was in the clinician's office. Limits of confidentiality were discussed with the patient.   Treatment Type: Individual Therapy  Reported Symptoms: Doing better  Mental Status Exam: Appearance:  Well Groomed     Behavior: Appropriate  Motor: Normal  Speech/Language:  Clear and Coherent  Affect: Appropriate  Mood: normal  Thought process: normal  Thought content:   WNL  Sensory/Perceptual disturbances:   WNL  Orientation: oriented to person, place, and time/date  Attention: Good  Concentration: Good  Memory: WNL  Fund of knowledge:  Good  Insight:   Fair  Judgment:  Good  Impulse Control: Good   Risk Assessment: Danger to Self:  No Self-injurious Behavior: No Danger to Others: No Duty to Warn:no Physical Aggression / Violence:No  Access to Firearms a concern: No  Gang Involvement:No   Subjective: Beginning the session, patient stated that the podcast episode really resonated with her and talked about how it did. She processed thoughts and emotions. She spent the session talking about how she "runs away" from her emotions. She voiced learning to sit with emotions is difficult. Patient acknowledged letting events and interactions ruminate inside of her causing distress. She reflected on ways to expressed those ruminations. She also reflected on an upcoming medical procedure. She ended the session talking about how she is learning to open up through the therapeutic relationship. She was agreeable to homework and following up. She denied suicidal and homicidal ideation.    Interventions:  Worked on developing a therapeutic relationship using active listening and reflective statements. Provided emotional support  using empathy and validation. Used summary statements. Reflected on how the podcast episode resonated with the patient. Used socratic questions to assist the patient. Used reflective statements. Processed thoughts and emotions. Explored the idea of sitting with emotions. Used socratic questions to assist the patient gain insight into self. Normalized and validated thoughts and emotions. Identified several different themes. Processed different ways that patient can express what she is experiencing internally in an external manner. Reviewed upcoming medical procedure. Processed what would help patient feel comfortable in sitting with emotions. Assisted in problem solving. Validated experience. Provided empathic statements. Assigned homework. Assessed for suicidal and homicidal ideation.   Homework: Write in journal and bring what she wrote to therapy to process  Next Session: Review homework and emotional support.   Diagnosis: F43.22 adjustment disorder with anxiety   Plan:   Goals Work through the grieving process and face reality of own death Accept emotional support from others around them Live life to the fullest, event though time may be limited Become as knowledgeable about the medical condition  Reduce fear, anxiety about the health condition  Accept the illness Accept the role of psychological and behavioral factors  Stabilize anxiety level wile increasing ability to function Learn and implement coping skills that result in a reduction of anxiety  Alleviate depressive symptoms Recognize, accept, and cope with depressive feelings Develop healthy thinking patterns Develop healthy interpersonal relationships  Objectives target date for all objectives is 07/10/2022 Identify feelings associated with the illness Family members share with each other feelings Identify the losses or limitations that have been experienced Verbalize acceptance of the reality of the medical condition Commit to  learning and implement  a proactive approach to managing personal stresses Verbalize an understanding of the medical condition Work with therapist to develop a plan for coping with stress Learn and implement skills for managing stress Engage in social, productive activities that are possible Engage in faith based activities implement positive imagery Identify coping skills and sources of emotional support Patient's partner and family members verbalize their fears regarding severity of health condition Identify sources of emotional distress  Learning and implement calming skills to reduce overall anxiety Learn and implement problem solving strategies Identify and engage in pleasant activities Learning and implement personal and interpersonal skills to reduce anxiety and improve interpersonal relationships Learn to accept limitations in life and commit to tolerating, rather than avoiding, unpleasant emotions while accomplishing meaningful goals Identify major life conflicts from the past and present that form the basis for present anxiety Learn and implement behavioral strategies Verbalize an understanding and resolution of current interpersonal problems Learn and implement problem solving and decision making skills Learn and implement conflict resolution skills to resolve interpersonal problems Verbalize an understanding of healthy and unhealthy emotions verbalize insight into how past relationships may be influence current experiences with depression Use mindfulness and acceptance strategies and increase value based behavior  Increase hopeful statements about the future.   Interventions Teach about stress and ways to handle stress Assist the patient in developing a coping action plan for stressors Conduct skills based training for coping strategies Train problem focused skills Sort out what activities the individual can do Encourage patient to rely upon his/her spiritual faith Teach the  patient to use guided imagery Probe and evaluate family's ability to provide emotional support Allow family to share their fears Assist the patient in identifying, sorting through, and verbalizing the various feelings generated by his/her medical condition Meet with family members  Ask patient list out limitations  Use stress inoculation training  Use Acceptance and Commitment Therapy to help client accept uncomfortable realities in order to accomplish value-consistent goals Reinforce the client's insight into the role of his/her past emotional pain and present anxiety  Discuss examples demonstrating that unrealistic worry overestimates the probability of threats and underestimate patient's ability  Assist the patient in analyzing his or her worries Help patient understand that avoidance is reinforcing  Behavioral activation help the client explore the relationship, nature of the dispute,  Help the client develop new interpersonal skills and relationships Conduct Problem so living therapy Teach conflict resolution skills Use a process-experiential approach Conduct TLDP Conduct ACT  The patient and clinician reviewed the treatment plan on 08/07/2021. The patient approved of the treatment plan.   Conception Chancy, PsyD

## 2022-01-11 DIAGNOSIS — J3081 Allergic rhinitis due to animal (cat) (dog) hair and dander: Secondary | ICD-10-CM | POA: Diagnosis not present

## 2022-01-11 DIAGNOSIS — J301 Allergic rhinitis due to pollen: Secondary | ICD-10-CM | POA: Diagnosis not present

## 2022-01-11 DIAGNOSIS — J3089 Other allergic rhinitis: Secondary | ICD-10-CM | POA: Diagnosis not present

## 2022-01-17 DIAGNOSIS — J3089 Other allergic rhinitis: Secondary | ICD-10-CM | POA: Diagnosis not present

## 2022-01-17 DIAGNOSIS — J3081 Allergic rhinitis due to animal (cat) (dog) hair and dander: Secondary | ICD-10-CM | POA: Diagnosis not present

## 2022-01-17 DIAGNOSIS — J301 Allergic rhinitis due to pollen: Secondary | ICD-10-CM | POA: Diagnosis not present

## 2022-01-25 DIAGNOSIS — J3089 Other allergic rhinitis: Secondary | ICD-10-CM | POA: Diagnosis not present

## 2022-01-25 DIAGNOSIS — J301 Allergic rhinitis due to pollen: Secondary | ICD-10-CM | POA: Diagnosis not present

## 2022-01-25 DIAGNOSIS — J3081 Allergic rhinitis due to animal (cat) (dog) hair and dander: Secondary | ICD-10-CM | POA: Diagnosis not present

## 2022-01-28 IMAGING — CR DG SI JOINTS 3+V
1 series · 4 of 4 positions shown · non-contrast
Comparison: None.

CLINICAL DATA: Right low back pain, SI joint pain

EXAM:
BILATERAL SACROILIAC JOINTS - 3+ VIEW

[Series 1: dg si joints · 0.14mm/px · 4 of 4 slices shown]
[im 1/4]
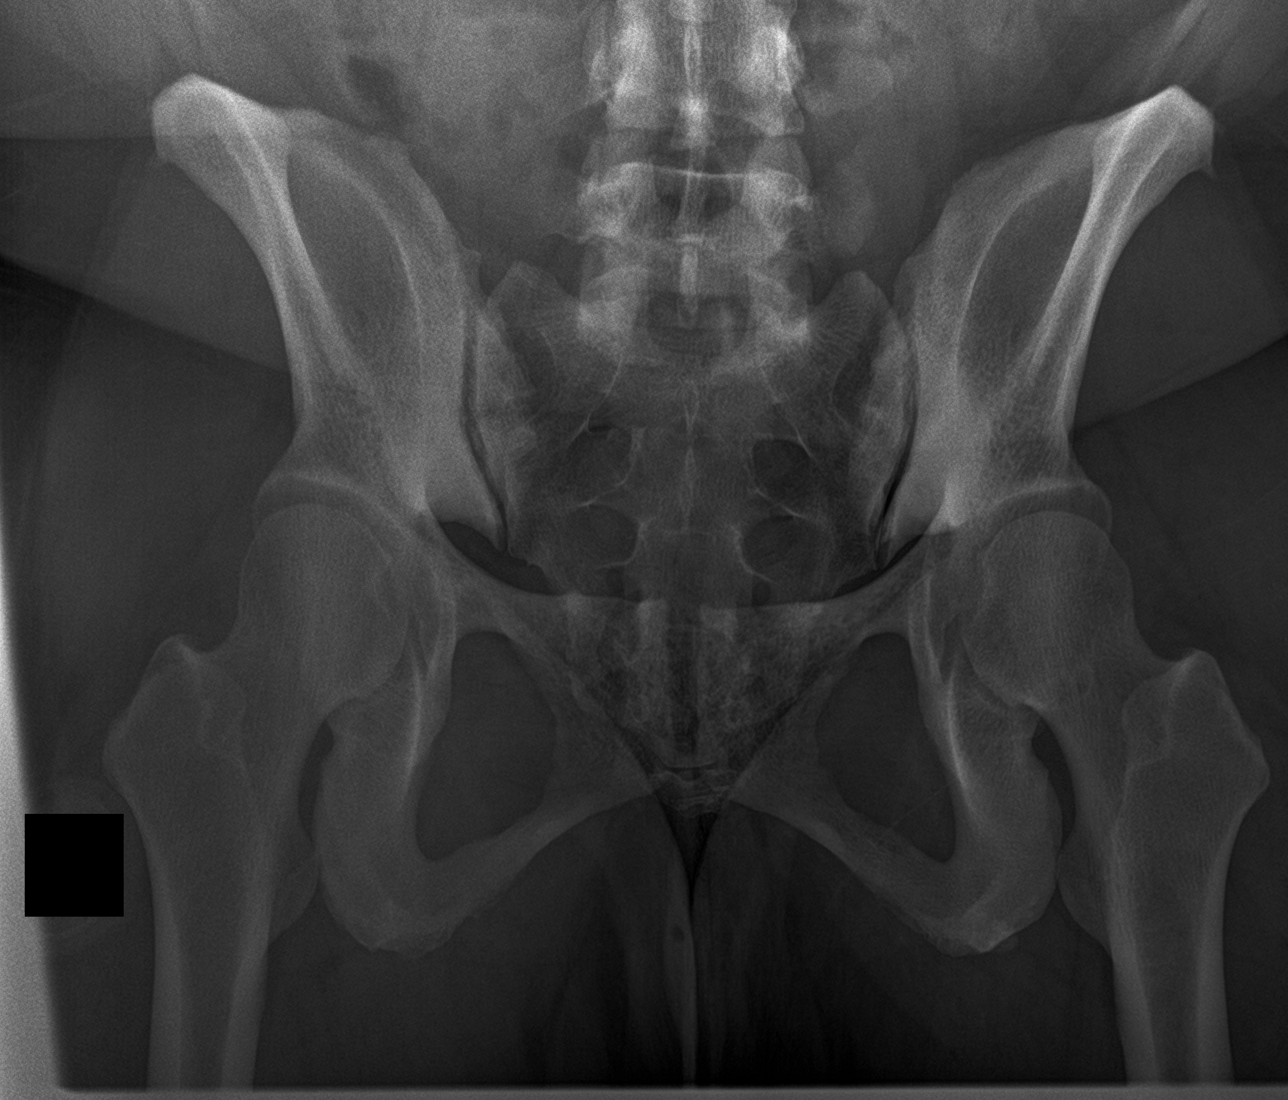
[im 2/4]
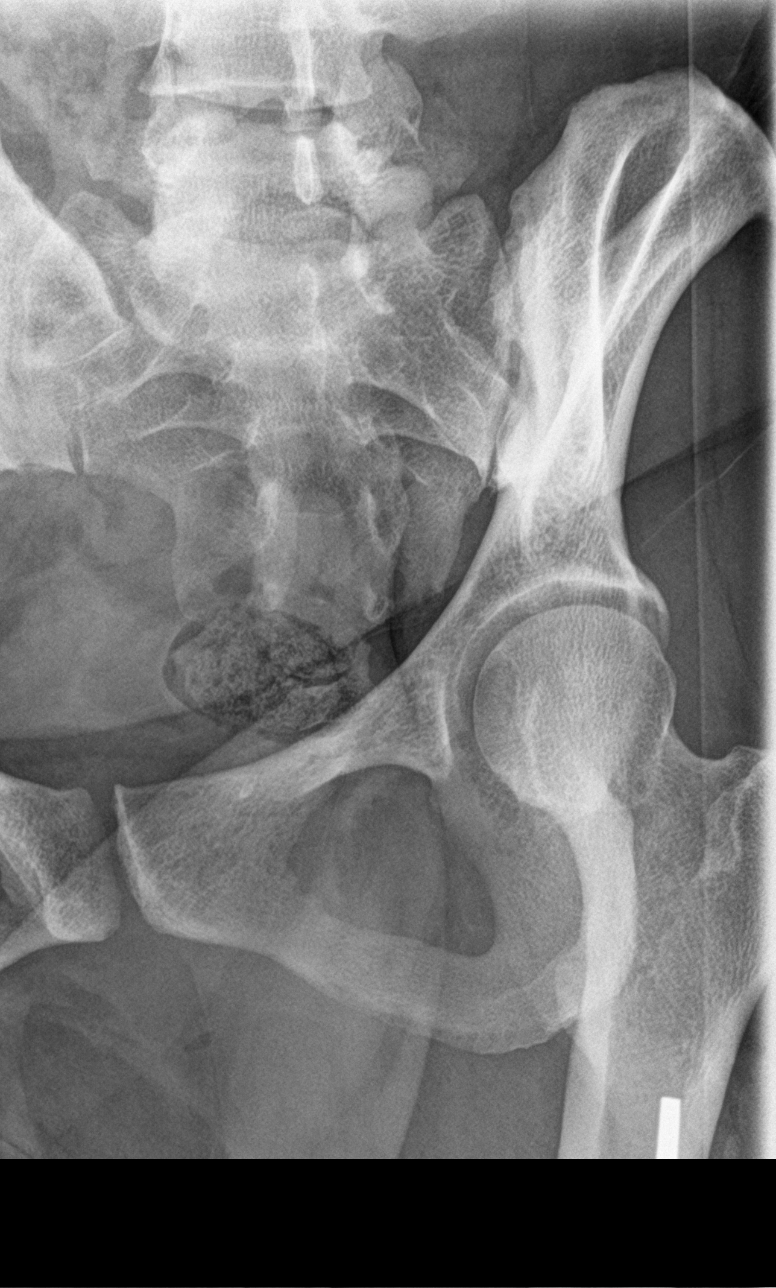
[im 3/4]
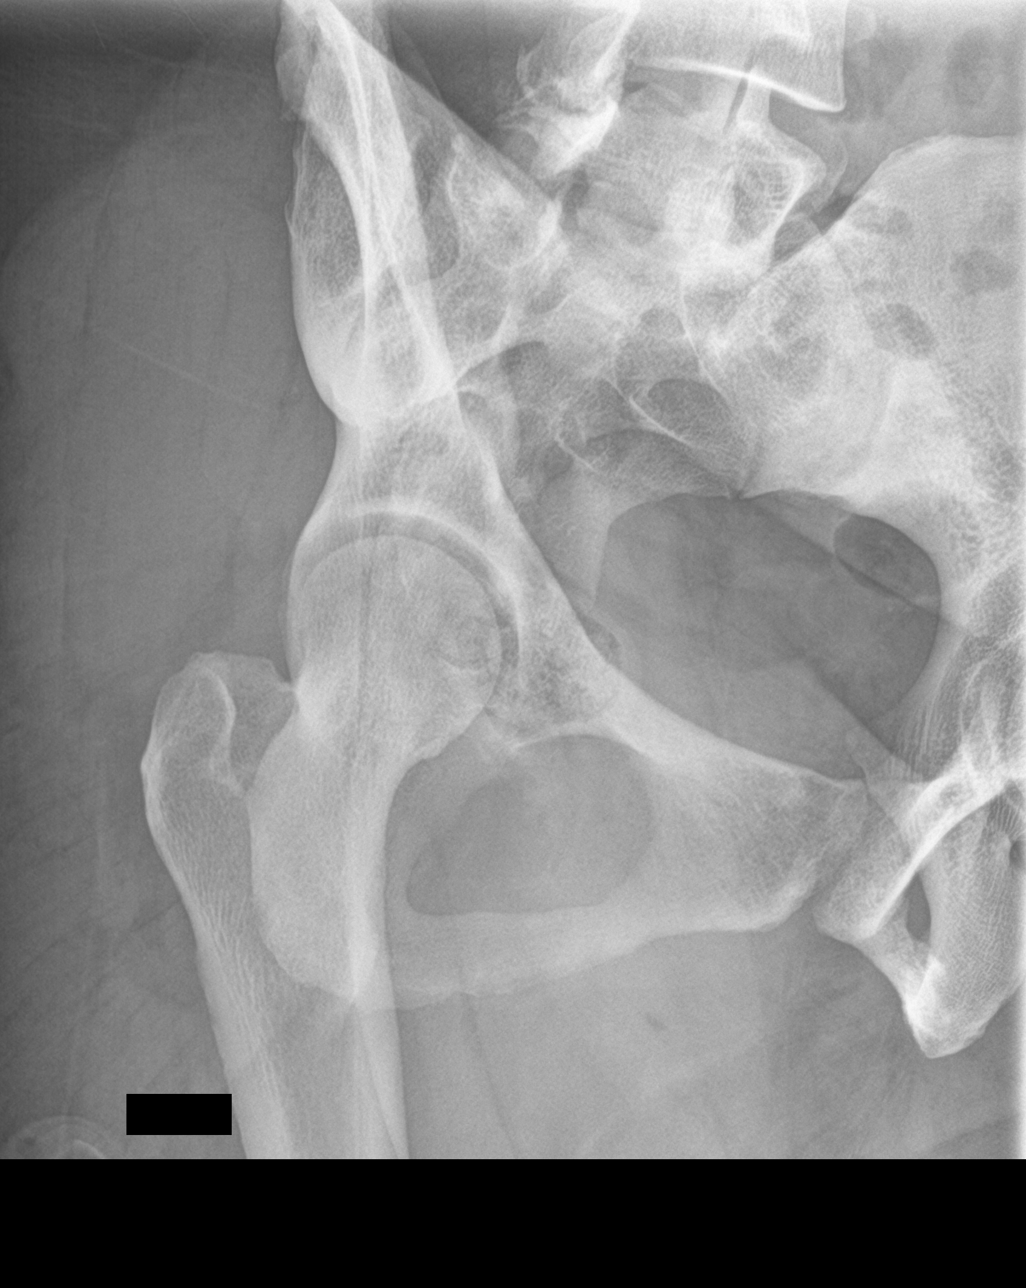
[im 4/4]
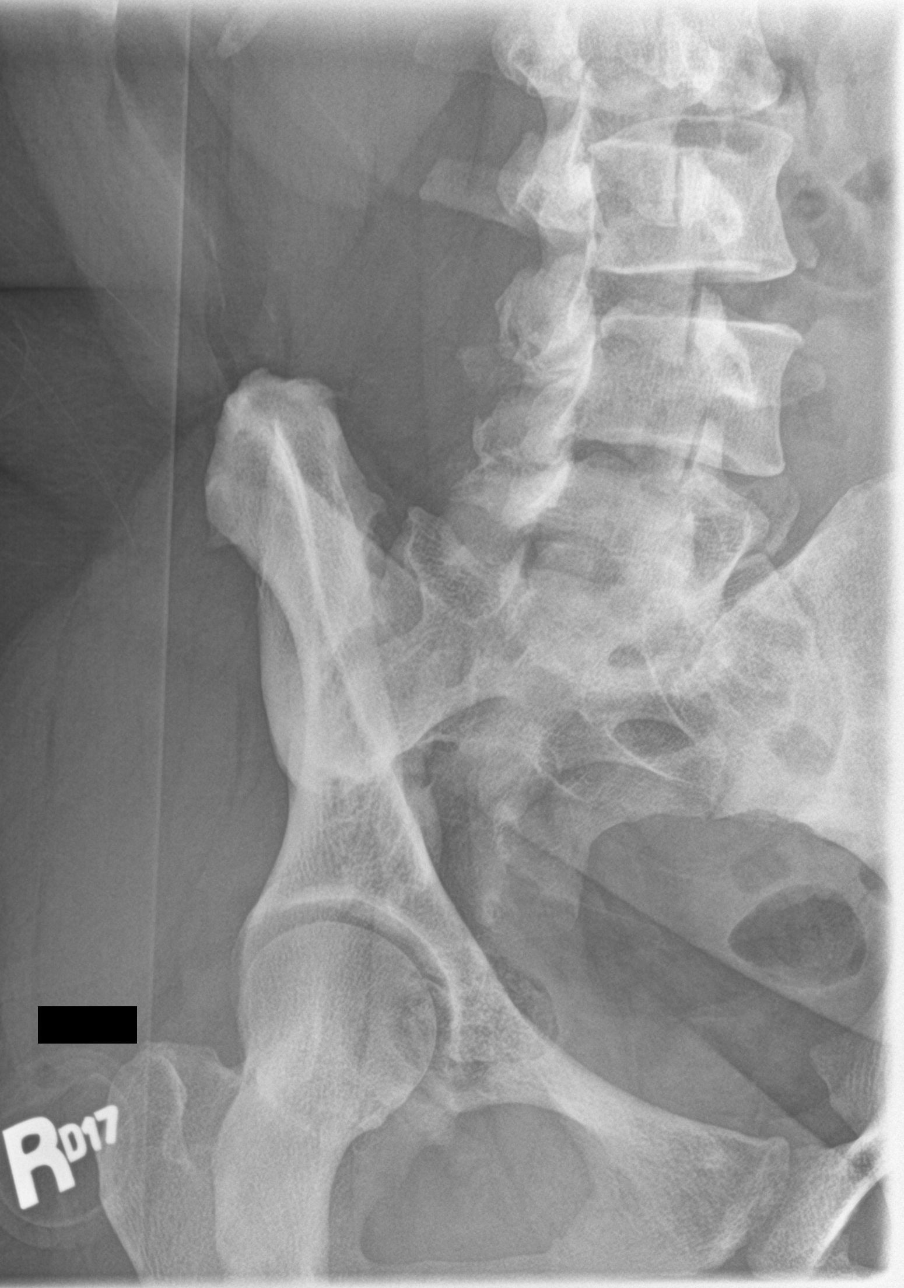

[4 of 4 positions shown; findings below may reference images not displayed]

FINDINGS: The sacroiliac joint spaces are maintained and there is no evidence
of arthropathy. No other bone abnormalities are seen.
IMPRESSION: Negative.

## 2022-01-28 IMAGING — CR DG LUMBAR SPINE COMPLETE 4+V
1 series · 5 of 5 positions shown · non-contrast
Comparison: None.

CLINICAL DATA: Right low back pain, SI joint pain

EXAM:
LUMBAR SPINE - COMPLETE 4+ VIEW

[Series 1: dg lumbar spine complete 4 +v · 0.14mm/px · 5 of 5 slices shown]
[im 1/5]
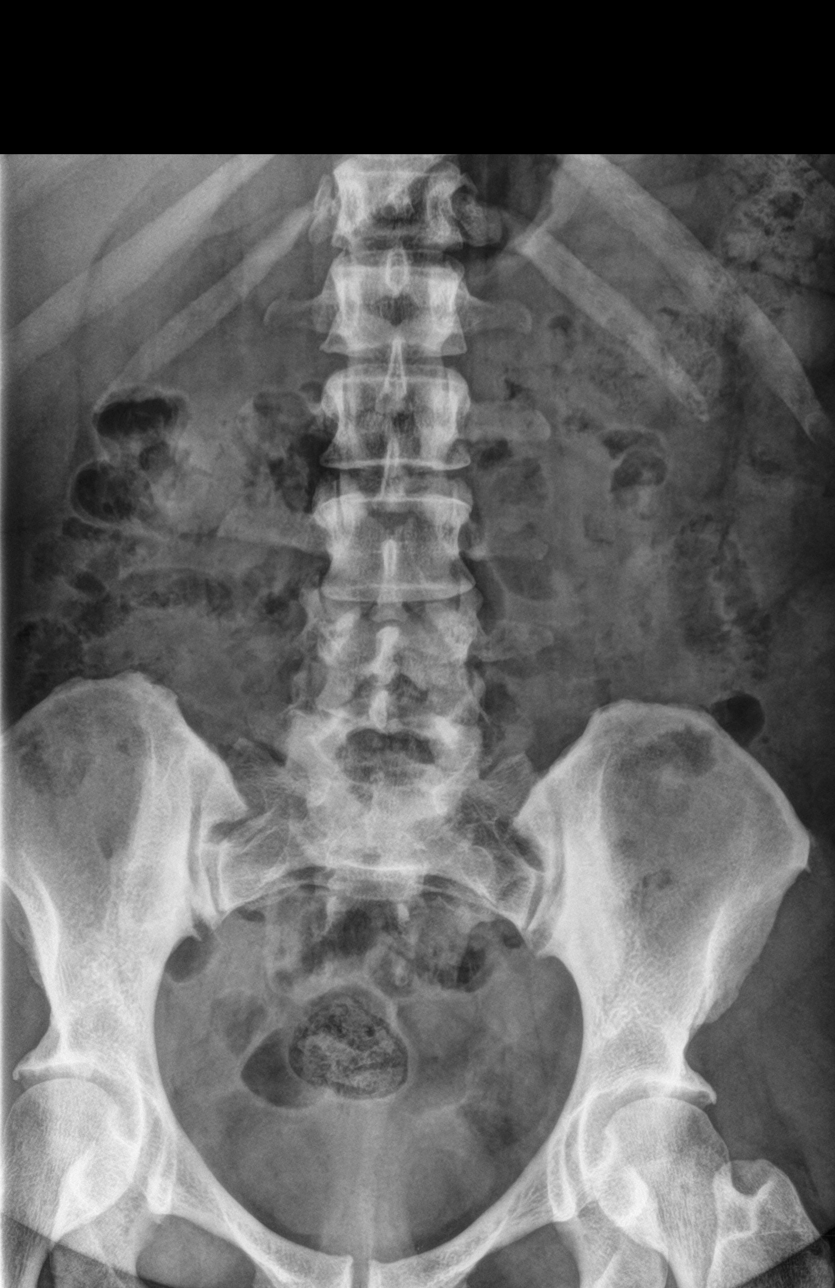
[im 2/5]
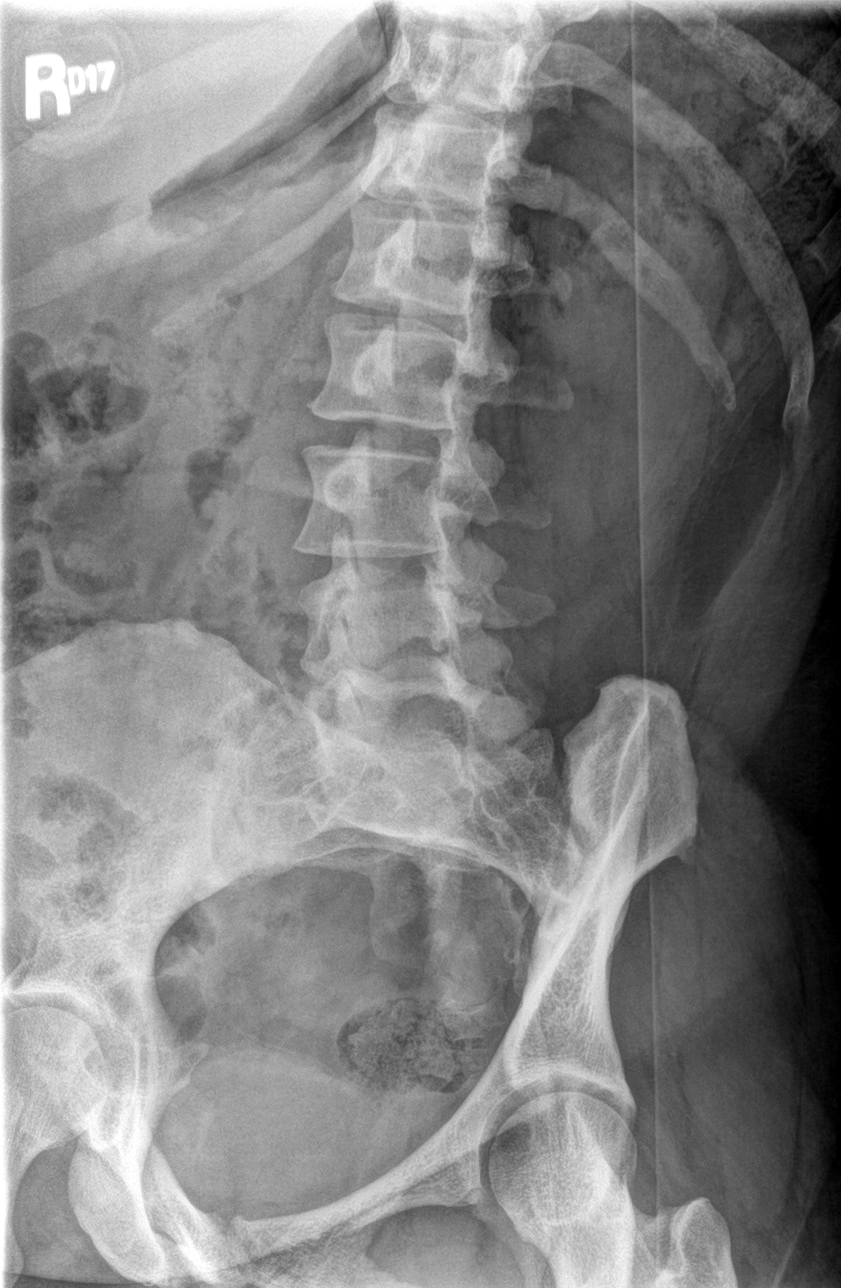
[im 3/5]
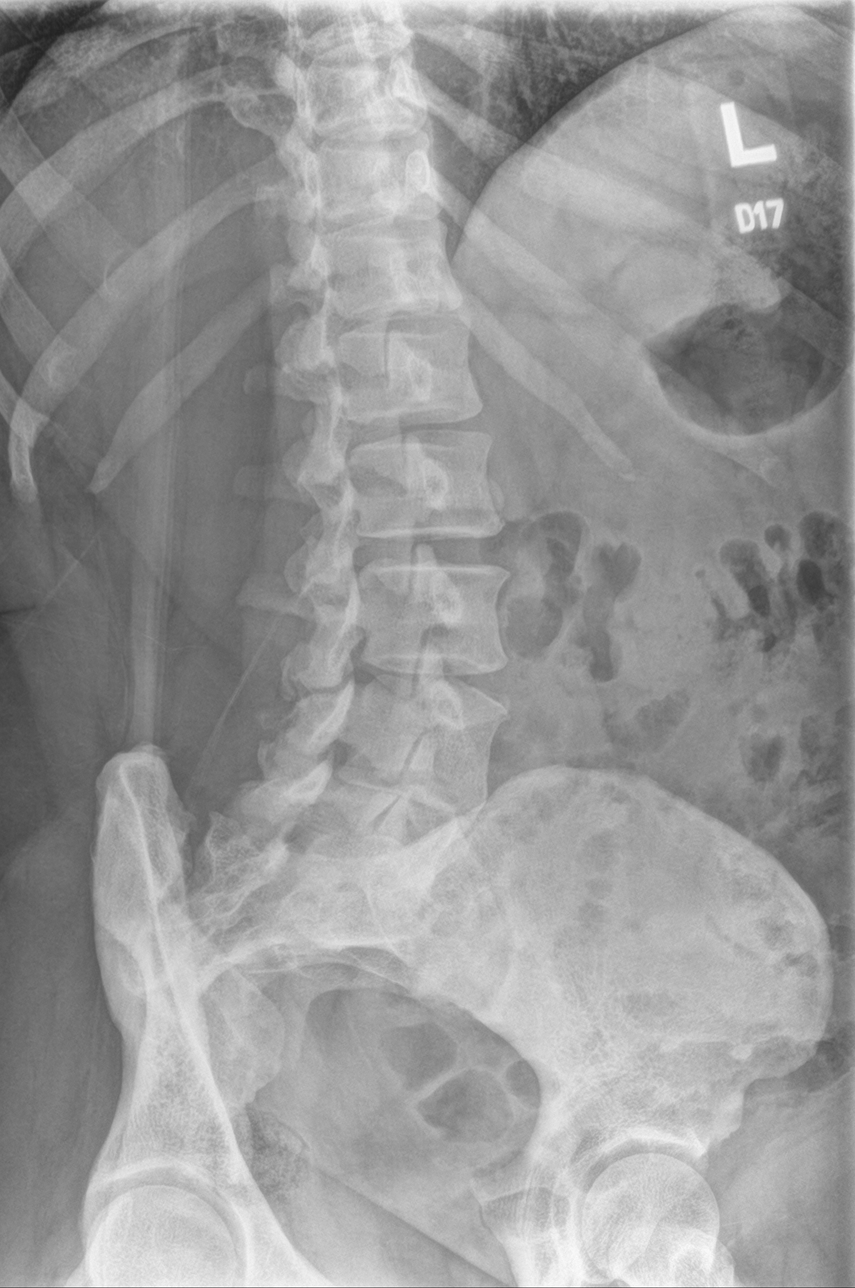
[im 4/5]
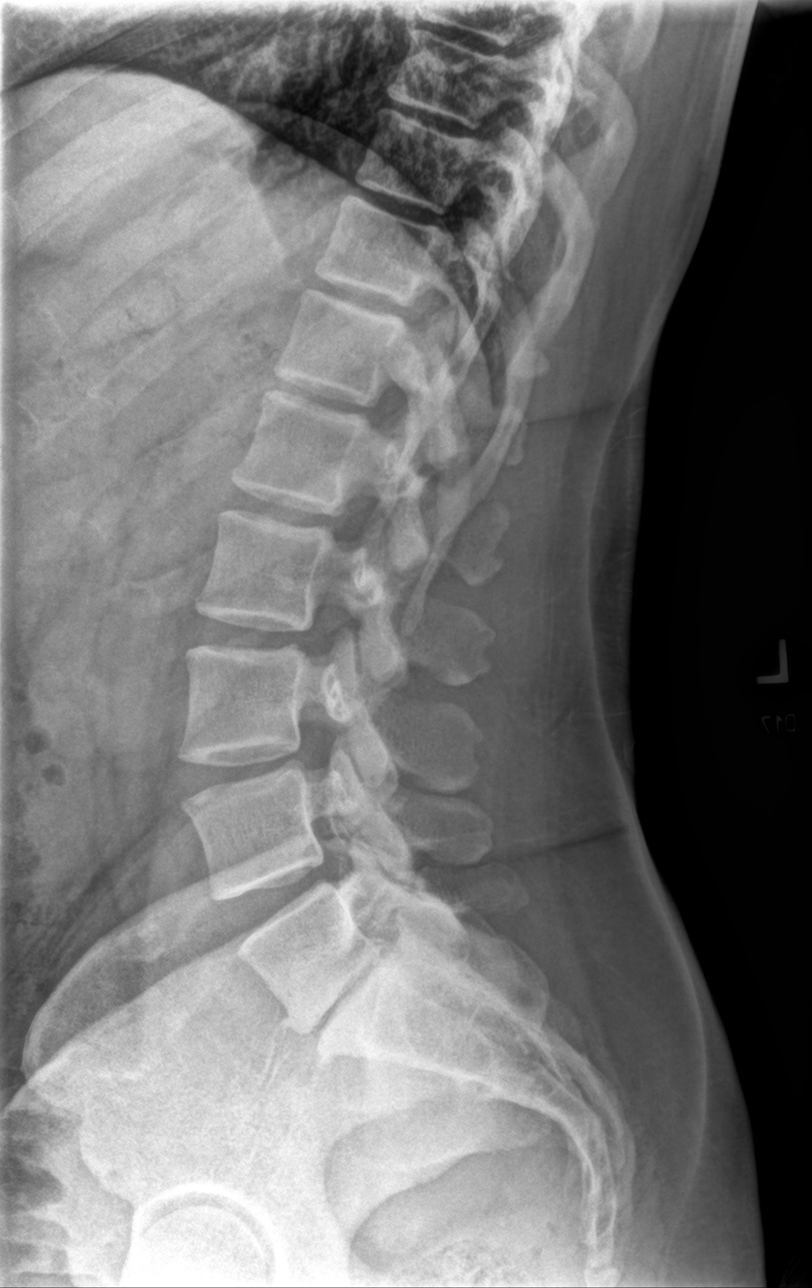
[im 5/5]
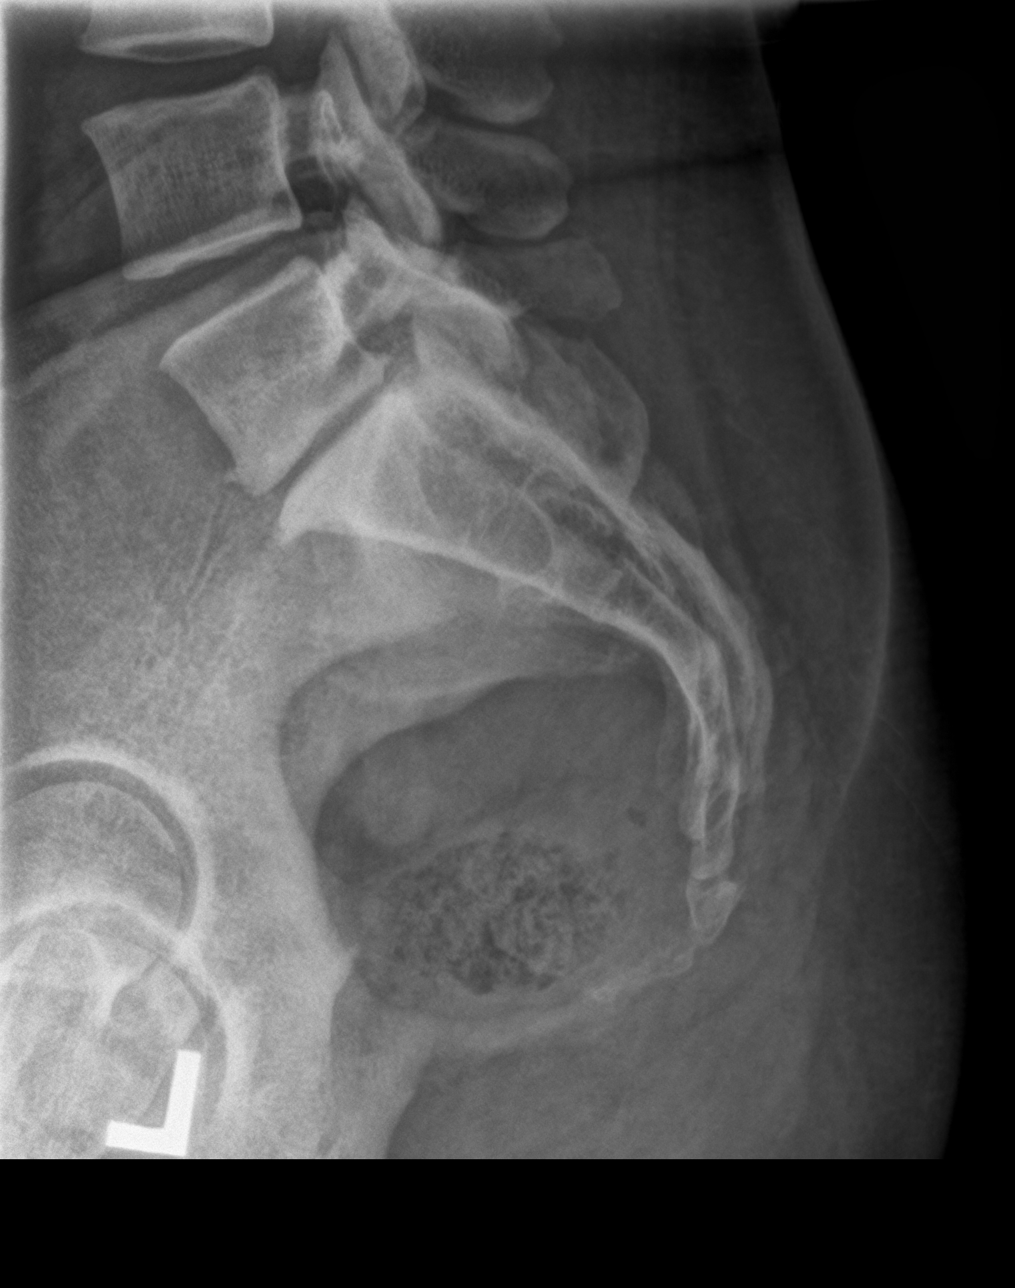

[5 of 5 positions shown; findings below may reference images not displayed]

FINDINGS: Degenerative disc disease at L5-S1 with disc space narrowing, vacuum
disc and spurring. Mild degenerative facet disease at L5-S1. SI
joints symmetric and unremarkable. No fracture or focal bone lesion.
IMPRESSION: Degenerative disc and facet disease in the lower lumbar spine. No
acute bony abnormality.

## 2022-01-31 ENCOUNTER — Ambulatory Visit (INDEPENDENT_AMBULATORY_CARE_PROVIDER_SITE_OTHER): Payer: 59 | Admitting: Plastic Surgery

## 2022-01-31 DIAGNOSIS — N651 Disproportion of reconstructed breast: Secondary | ICD-10-CM

## 2022-01-31 DIAGNOSIS — C50412 Malignant neoplasm of upper-outer quadrant of left female breast: Secondary | ICD-10-CM

## 2022-02-03 NOTE — Progress Notes (Signed)
The patient presents to discuss possible fat grafting to bilateral upper breast.  She notes some concavity which is more on the left but some bilaterally.  Physical exam Some concavity bilateral upper breast particularly on the left.  Assessment plan Given that I am leaving the practice I recommend that she consult with Dr. Marla Roe or Dr. Lovena Le to determine whether they think she is a good candidate for fat grafting to the upper pole.

## 2022-02-04 ENCOUNTER — Ambulatory Visit (INDEPENDENT_AMBULATORY_CARE_PROVIDER_SITE_OTHER): Payer: 59 | Admitting: Psychologist

## 2022-02-04 DIAGNOSIS — F4322 Adjustment disorder with anxiety: Secondary | ICD-10-CM

## 2022-02-04 NOTE — Progress Notes (Signed)
[facility] Counselor/Therapist Progress Note  Patient ID: Tracey Huff, MRN: [id number],    Date: 02/04/2022  Time Spent: 03:03 pm to 03:45 pm; total time: 42 minutes   This session was held via in person. The patient consented to in-person therapy and was in the clinician's office. Limits of confidentiality were discussed with the patient.   Treatment Type: Individual Therapy  Reported Symptoms: Stress related to husband  Mental Status Exam: Appearance:  Well Groomed     Behavior: Appropriate  Motor: Normal  Speech/Language:  Clear and Coherent  Affect: Appropriate  Mood: normal  Thought process: normal  Thought content:   WNL  Sensory/Perceptual disturbances:   WNL  Orientation: oriented to person, place, and time/date  Attention: Good  Concentration: Good  Memory: WNL  Fund of knowledge:  Good  Insight:   Fair  Judgment:  Good  Impulse Control: Good   Risk Assessment: Danger to Self:  No Self-injurious Behavior: No Danger to Others: No Duty to Warn:no Physical Aggression / Violence:No  Access to Firearms a concern: No  Gang Involvement:No   Subjective: Beginning the session, patient stated that she had experienced a difficult few days. Elaborating, she disclosed that her husband was in the ICU through the weekend due to experiencing seizures. He was being discharged today, which surprised the patient and contributed to her feeling very upset. She processed emotions she was experiencing. She was agreeable to following up. She denied suicidal and homicidal ideation.    Interventions:  Worked on developing a therapeutic relationship using active listening and reflective statements. Provided emotional support using empathy and validation. Used summary statements. Normalized and validated expressed thoughts and emotions. Reviewed the events that have occurred over the last few days. Explored possible reasons that may have contributed to some of the  decisions that were being made by the medical team. Validated patient's experience. Explored what self-care steps the patient could implement. Spent time processing the different thoughts and emotions she was experiencing. Praised patient for completing homework assignment. Provided empathic statements. Assessed for suicidal and homicidal ideation.   Homework: Implement self-care  Next Session: Review homework, process emotions from journal, and emotional support. Possibly discuss coping skills  Diagnosis: F43.22 adjustment disorder with anxiety   Plan:   Goals Work through the grieving process and face reality of own death Accept emotional support from others around them Live life to the fullest, event though time may be limited Become as knowledgeable about the medical condition  Reduce fear, anxiety about the health condition  Accept the illness Accept the role of psychological and behavioral factors  Stabilize anxiety level wile increasing ability to function Learn and implement coping skills that result in a reduction of anxiety  Alleviate depressive symptoms Recognize, accept, and cope with depressive feelings Develop healthy thinking patterns Develop healthy interpersonal relationships  Objectives target date for all objectives is 07/10/2022 Identify feelings associated with the illness Family members share with each other feelings Identify the losses or limitations that have been experienced Verbalize acceptance of the reality of the medical condition Commit to learning and implement a proactive approach to managing personal stresses Verbalize an understanding of the medical condition Work with therapist to develop a plan for coping with stress Learn and implement skills for managing stress Engage in social, productive activities that are possible Engage in faith based activities implement positive imagery Identify coping skills and sources of emotional support Patient's  partner and family members verbalize their fears regarding severity of  health condition Identify sources of emotional distress  Learning and implement calming skills to reduce overall anxiety Learn and implement problem solving strategies Identify and engage in pleasant activities Learning and implement personal and interpersonal skills to reduce anxiety and improve interpersonal relationships Learn to accept limitations in life and commit to tolerating, rather than avoiding, unpleasant emotions while accomplishing meaningful goals Identify major life conflicts from the past and present that form the basis for present anxiety Learn and implement behavioral strategies Verbalize an understanding and resolution of current interpersonal problems Learn and implement problem solving and decision making skills Learn and implement conflict resolution skills to resolve interpersonal problems Verbalize an understanding of healthy and unhealthy emotions verbalize insight into how past relationships may be influence current experiences with depression Use mindfulness and acceptance strategies and increase value based behavior  Increase hopeful statements about the future.   Interventions Teach about stress and ways to handle stress Assist the patient in developing a coping action plan for stressors Conduct skills based training for coping strategies Train problem focused skills Sort out what activities the individual can do Encourage patient to rely upon his/her spiritual faith Teach the patient to use guided imagery Probe and evaluate family's ability to provide emotional support Allow family to share their fears Assist the patient in identifying, sorting through, and verbalizing the various feelings generated by his/her medical condition Meet with family members  Ask patient list out limitations  Use stress inoculation training  Use Acceptance and Commitment Therapy to help client accept  uncomfortable realities in order to accomplish value-consistent goals Reinforce the client's insight into the role of his/her past emotional pain and present anxiety  Discuss examples demonstrating that unrealistic worry overestimates the probability of threats and underestimate patient's ability  Assist the patient in analyzing his or her worries Help patient understand that avoidance is reinforcing  Behavioral activation help the client explore the relationship, nature of the dispute,  Help the client develop new interpersonal skills and relationships Conduct Problem so living therapy Teach conflict resolution skills Use a process-experiential approach Conduct TLDP Conduct ACT  The patient and clinician reviewed the treatment plan on 08/07/2021. The patient approved of the treatment plan.   Conception Chancy, PsyD

## 2022-02-13 ENCOUNTER — Encounter: Payer: Self-pay | Admitting: Plastic Surgery

## 2022-02-13 ENCOUNTER — Other Ambulatory Visit (HOSPITAL_COMMUNITY): Payer: Self-pay

## 2022-02-13 ENCOUNTER — Ambulatory Visit: Payer: 59 | Admitting: Plastic Surgery

## 2022-02-13 ENCOUNTER — Telehealth (INDEPENDENT_AMBULATORY_CARE_PROVIDER_SITE_OTHER): Payer: 59 | Admitting: Family Medicine

## 2022-02-13 ENCOUNTER — Encounter: Payer: Self-pay | Admitting: Family Medicine

## 2022-02-13 ENCOUNTER — Ambulatory Visit: Payer: Self-pay | Admitting: *Deleted

## 2022-02-13 VITALS — BP 158/92 | HR 88 | Ht 60.0 in | Wt 179.2 lb

## 2022-02-13 DIAGNOSIS — Z9013 Acquired absence of bilateral breasts and nipples: Secondary | ICD-10-CM | POA: Diagnosis not present

## 2022-02-13 DIAGNOSIS — C50412 Malignant neoplasm of upper-outer quadrant of left female breast: Secondary | ICD-10-CM | POA: Diagnosis not present

## 2022-02-13 DIAGNOSIS — Z170 Estrogen receptor positive status [ER+]: Secondary | ICD-10-CM

## 2022-02-13 DIAGNOSIS — Z9889 Other specified postprocedural states: Secondary | ICD-10-CM

## 2022-02-13 DIAGNOSIS — N651 Disproportion of reconstructed breast: Secondary | ICD-10-CM

## 2022-02-13 DIAGNOSIS — T63444A Toxic effect of venom of bees, undetermined, initial encounter: Secondary | ICD-10-CM

## 2022-02-13 DIAGNOSIS — R229 Localized swelling, mass and lump, unspecified: Secondary | ICD-10-CM | POA: Diagnosis not present

## 2022-02-13 MED ORDER — PREDNISONE 20 MG PO TABS
ORAL_TABLET | ORAL | 0 refills | Status: AC
  Filled 2022-02-13: qty 9, 6d supply, fill #0

## 2022-02-13 NOTE — Progress Notes (Signed)
Name: Tracey Huff   MRN: [id number]    DOB: 1971/01/30   Date:02/13/2022       Progress Note  Subjective:    Chief Complaint  Chief Complaint  Patient presents with   Insect Bite    Yesterday got stung by a bee, Pt states the site is now sore and has hives/itching, pt used topical cortisone and benadryl w/ no relief. No vital signs to provide    I connected with  [name]  on 02/13/22 at  2:20 PM EDT by a video enabled telemedicine application and verified that I am speaking with the correct person using two identifiers.  I discussed the limitations of evaluation and management by telemedicine and the availability of in person appointments. The patient expressed understanding and agreed to proceed. Staff also discussed with the patient that there may be a patient responsible charge related to this service. Patient Location: work - was able to go to private area to speak Provider Location: [facility] clinic office Additional Individuals present: none  HPI She got stung by a bee yesterday to her left upper inner arm, she has large area of swelling, redness, burining and stinging.  No improvement with topical steroids and benadryl, some mild temporary relief with applying ice pack No spreading to anywhere else on her body, no lip or face swelling, no sensation of throat closure, wheeze, dyspnea, stridor She has asthma and seasonal allergies but was told to hold her meds when she is taking hydroxyzine as needed for stress/anxiety so not currently on her xyzal and singulair.  Patient Active Problem List   Diagnosis Date Noted   Colon cancer screening    Persistent adjustment disorder with anxiety 12/20/2021   Breast cancer ([place]) 05/15/2021   Genetic testing 05/10/2021   Family history of prostate cancer 04/17/2021   Allergic rhinitis 03/21/2021   Allergic rhinitis due to animal (cat) (dog) hair and dander 03/21/2021   Allergic rhinitis due to pollen 03/21/2021   Chronic allergic  conjunctivitis 03/21/2021   Mild persistent asthma, uncomplicated 28/41/3244   Carcinoma of upper-outer quadrant of left breast in female, estrogen receptor positive ([place]) 03/21/2021   High grade squamous intraepithelial cervical dysplasia 01/29/2019   CIN III (cervical intraepithelial neoplasia grade III) with severe dysplasia 08/11/2018   Atypical squamous cell changes of undetermined significance (ASCUS) on cervical cytology with positive high risk human papilloma virus (HPV) 06/29/2018   Prediabetes 06/24/2018   Class 1 obesity due to excess calories with serious comorbidity and body mass index (BMI) of 33.0 to 33.9 in adult 05/20/2018   Essential hypertension 05/20/2018   Hyperlipidemia 05/20/2018   Asthma 05/20/2018   Anxiety in acute stress reaction 05/20/2018   Status post hysterectomy 11/15/2014    Social History   Tobacco Use   Smoking status: Never    Passive exposure: Never   Smokeless tobacco: Never  Substance Use Topics   Alcohol use: Not Currently    Comment: occassional     Current Outpatient Medications:    albuterol (PROVENTIL HFA) 108 (90 Base) MCG/ACT inhaler, Inhale 1 to 2 puffs every 4-6 hours as needed for cough/wheeze, Disp: 18 g, Rfl: 0   Ascorbic Acid (VITAMIN C) 100 MG tablet, Take 100 mg by mouth daily., Disp: , Rfl:    atorvastatin (LIPITOR) 20 MG tablet, Take 1 tablet (20 mg total) by mouth at bedtime. (Patient taking differently: Take 10 mg by mouth every other day. Bedtime), Disp: 90 tablet, Rfl: 3   AUVI-Q  0.3 MG/0.3ML SOAJ injection, Inject 0.3 mg into the muscle as needed for anaphylaxis., Disp: , Rfl:    azelastine (ASTELIN) 0.1 % nasal spray, Place 1-2 sprays into both nostrils 2 (two) times daily. (Patient taking differently: Place 1-2 sprays into both nostrils daily as needed for rhinitis or allergies.), Disp: 30 mL, Rfl: 6   busPIRone (BUSPAR) 7.5 MG tablet, Take 1 tablet once daily at night; may take 1 additional tablet daily if needed for  anxiety. (Patient taking differently: Take 7.5 mg by mouth as needed. Take 1 tablet once daily at night; may take 1 additional tablet daily if needed for anxiety.), Disp: 180 tablet, Rfl: 1   Cholecalciferol 25 MCG (1000 UT) capsule, Take 1,000 Units by mouth daily., Disp: , Rfl:    diphenhydrAMINE (BENADRYL) 2 % cream, Apply 1 application topically 2 (two) times daily as needed for itching., Disp: , Rfl:    ELDERBERRY PO, Take 1 capsule by mouth daily., Disp: , Rfl:    ezetimibe (ZETIA) 10 MG tablet, Take 1 tablet (10 mg total) by mouth daily., Disp: 90 tablet, Rfl: 3   fluticasone (FLONASE) 50 MCG/ACT nasal spray, Place 1-2 sprays into both nostrils daily. (Patient taking differently: Place 1-2 sprays into both nostrils daily as needed for allergies or rhinitis.), Disp: 16 g, Rfl: 6   Fluticasone-Umeclidin-Vilant (TRELEGY ELLIPTA) 200-62.5-25 MCG/ACT AEPB, Inhale 1 puff into the lungs daily. (Patient taking differently: Inhale 1 puff into the lungs every morning.), Disp: 60 each, Rfl: 6   hydrocortisone cream 1 %, Apply 1 application topically daily as needed for itching., Disp: , Rfl:    hydrOXYzine (VISTARIL) 25 MG capsule, TAKE 1 CAPSULE (25 MG TOTAL) BY MOUTH AT BEDTIME AS NEEDED FOR UP TO 30 DOSES (DIFFICULTY SLEEPING)., Disp: 30 capsule, Rfl: 1   ibuprofen (ADVIL) 200 MG tablet, Take 400 mg by mouth every 6 (six) hours as needed for moderate pain., Disp: , Rfl:    ipratropium-albuterol (DUONEB) 0.5-2.5 (3) MG/3ML SOLN, Take 3 mLs by nebulization every 6 (six) hours as needed (wheeze, SOB, cough variant asthma)., Disp: 360 mL, Rfl: 1   levocetirizine (XYZAL) 5 MG tablet, Take 1 tablet (5 mg total) by mouth every evening., Disp: 30 tablet, Rfl: 6   losartan (COZAAR) 25 MG tablet, Take 1 tablet (25 mg total) by mouth every morning., Disp: 90 tablet, Rfl: 1   montelukast (SINGULAIR) 10 MG tablet, Take 1 tablet (10 mg total) by mouth every evening., Disp: 30 tablet, Rfl: 6   Olopatadine HCl  (PATADAY) 0.2 % SOLN, Place 1 drop into affected eye daily. (Patient taking differently: Place 1 drop into both eyes daily as needed (itching).), Disp: 2.5 mL, Rfl: 6   ondansetron (ZOFRAN-ODT) 4 MG disintegrating tablet, Take 1 tablet (4 mg total) by mouth every 8 (eight) hours as needed for nausea or vomiting., Disp: 20 tablet, Rfl: 0   predniSONE (DELTASONE) 20 MG tablet, 2 tabs poqday 1-3, 1 tabs poqday 4-6, Disp: 9 tablet, Rfl: 0   pregabalin (LYRICA) 50 MG capsule, Take 1 capsule (50 mg total) by mouth 3 (three) times daily., Disp: 30 capsule, Rfl: 0   tamoxifen (NOLVADEX) 20 MG tablet, Take 1 tablet (20 mg total) by mouth daily., Disp: 90 tablet, Rfl: 1  Allergies  Allergen Reactions   Macadamia Nut Oil Shortness Of Breath   Apple Juice Nausea And Vomiting   [place] Nut (Berthollefia [place]) Skin Test Nausea And Vomiting and Swelling    Tongue swelling   Carrot Oil Nausea  And Vomiting    Can not cooked raw carrots   Fruit & Vegetable Daily [Nutritional Supplements] Nausea And Vomiting    Cannot tolerate apples, peaches, plums, and nectarines   Kiwi Extract Nausea And Vomiting   Peach [Prunus Persica] Nausea And Vomiting    I personally reviewed active problem list, medication list, allergies, family history, social history, health maintenance, notes from last encounter, lab results, imaging with the patient/caregiver today.   Review of Systems  Constitutional: Negative.   HENT: Negative.    Eyes: Negative.   Respiratory: Negative.    Cardiovascular: Negative.   Gastrointestinal: Negative.   Endocrine: Negative.   Genitourinary: Negative.   Musculoskeletal: Negative.   Skin: Negative.   Allergic/Immunologic: Negative.   Neurological: Negative.   Hematological: Negative.   Psychiatric/Behavioral: Negative.    All other systems reviewed and are negative.     Objective:   Virtual encounter, vitals limited, only able to obtain the following There were no vitals filed  for this visit. There is no height or weight on file to calculate BMI. Nursing Note and Vital Signs reviewed.  Physical Exam Vitals and nursing note reviewed.  Constitutional:      General: She is not in acute distress.    Appearance: Normal appearance. She is well-developed. She is obese. She is not toxic-appearing or diaphoretic.  HENT:     Head: Normocephalic and atraumatic.     Nose: Nose normal.  Eyes:     General:        Right eye: No discharge.        Left eye: No discharge.     Conjunctiva/sclera: Conjunctivae normal.  Neck:     Trachea: Trachea and phonation normal. No tracheal deviation.  Cardiovascular:     Rate and Rhythm: Normal rate and regular rhythm.  Pulmonary:     Effort: Pulmonary effort is normal. No respiratory distress.     Breath sounds: No stridor.  Musculoskeletal:        General: Normal range of motion.  Skin:    General: Skin is warm and dry.     Findings: No rash.     Comments: Only able to see a small portion of left upper inner arm in video - could see erythema and a line drawn around the redness   Neurological:     Mental Status: She is alert.     Motor: No abnormal muscle tone.     Coordination: Coordination normal.  Psychiatric:        Behavior: Behavior normal.     PE limited by virtual encounter  No results found for this or any previous visit (from the past 72 hour(s)).  Assessment and Plan:     ICD-10-CM   1. Swelling of skin  R22.9 predniSONE (DELTASONE) 20 MG tablet    2. Bee sting, undetermined intent, initial encounter  T63.444A      Suspect localized allergic reaction Encouraged her to treat with resuming her antihistamine daily and even could add a second dose short term for reaction, can add pepcid to potentiate antihistamine affect, encouraged her to resume singulair with her hx - unlikely to have any trouble with singulair and hydroxyzine prn Steroids for 3-5 days may help, but I also explained the reaction may subside  in 2-3 days on its own Applying ice to affected areas Expect worse symptoms at nighttime - is common Currently no signs of anaphylactic rxn, Airway intact  Concerning signs and sx to watch for were reviewed  and she verbalized understanding (infection or worsening allergic reaction) though reassured her this appears very localized and consistent with bee sting skin rxns    -Red flags and when to present for emergency care or RTC including fever >101.62F, chest pain, shortness of breath, new/worsening/un-resolving symptoms, reviewed with patient at time of visit. Follow up and care instructions discussed and provided in AVS. - I discussed the assessment and treatment plan with the patient. The patient was provided an opportunity to ask questions and all were answered. The patient agreed with the plan and demonstrated an understanding of the instructions.  I provided 18+ minutes of non-face-to-face time during this encounter.  Delsa Grana, PA-C 02/13/22 2:40 PM

## 2022-02-13 NOTE — Telephone Encounter (Signed)
Summary: hives   Pt states she was stung on her lt upper arm by a bee yesterday 10-10   Pt states the site is now sore and has hives/itching, pt used topical cortisone and benadryl w/ no relief   Pt requesting a cb to discuss other otc options, pt denies any other symptoms   Please advise      Reason for Disposition  [1] Red or very tender (to touch) area AND [2] started over 24 hours after the sting  Answer Assessment - Initial Assessment Questions 1. TYPE: "What type of sting was it?" (bee, yellow jacket, etc.)      honeybee 2. ONSET: "When did it occur?"      Yesterday morning 3. LOCATION: "Where is the sting located?"  "How many stings?"     Upper left arm 4. SWELLING SIZE: "How big is the swelling?" (e.g., inches or cm)     Larger than 50 cent piece  5. REDNESS: "Is the area red or pink?" If Yes, ask: "What size is area of redness?" (e.g., inches or cm). "When did the redness start?"     Discolored- using benadryl  6. PAIN: "Is there any pain?" If Yes, ask: "How bad is it?"  (Scale 1-10; or mild, moderate, severe)     This morning stinging 7. ITCHING: "Is there any itching?" If Yes, ask: "How bad is it?"      severe 8. RESPIRATORY DISTRESS: "Describe your breathing."     Normal breathing 9. PRIOR REACTIONS: "Have you had any severe allergic reactions to stings in the past?" if yes, ask: "What happened?"     Avoids bees- not sure 10. OTHER SYMPTOMS: "Do you have any other symptoms?" (e.g., abdomen pain, face or tongue swelling, new rash elsewhere, vomiting)        no 11. PREGNANCY: "Is there any chance you are pregnant?" "When was your last menstrual period?"       *No Answer*  Protocols used: Bee or Yellow Jacket Sting-A-AH

## 2022-02-13 NOTE — Progress Notes (Signed)
Referring Provider Bo Merino, [place],  [place] [address]   CC:  Chief Complaint  Patient presents with   Advice Only      Tracey Huff is an 51 y.o. female.  HPI: 51 year old female who previously underwent bilateral mastectomies for breast cancer presents today for evaluation and scheduling for fat grafting to correct contour irregularities of her reconstruction.  She was diagnosed with breast cancer in August of last year and underwent to a lumpectomy.  She had residual disease and elected to have bilateral mastectomies for treatment and prophylaxis.  She did well after her initial reconstruction however she has several areas of contour irregularities that she would like to address. Allergies  Allergen Reactions   Macadamia Nut Oil Shortness Of Breath   Apple Juice Nausea And Vomiting   B[place] Nut (Berthollefia [place]) Skin Test Nausea And Vomiting and Swelling    Tongue swelling   Carrot Oil Nausea And Vomiting    Can not cooked raw carrots   Fruit & Vegetable Daily [Nutritional Supplements] Nausea And Vomiting    Cannot tolerate apples, peaches, plums, and nectarines   Kiwi Extract Nausea And Vomiting   Peach [Prunus Persica] Nausea And Vomiting    Outpatient Encounter Medications as of 02/13/2022  Medication Sig   albuterol (PROVENTIL HFA) 108 (90 Base) MCG/ACT inhaler Inhale 1 to 2 puffs every 4-6 hours as needed for cough/wheeze   Ascorbic Acid (VITAMIN C) 100 MG tablet Take 100 mg by mouth daily.   atorvastatin (LIPITOR) 20 MG tablet Take 1 tablet (20 mg total) by mouth at bedtime. (Patient taking differently: Take 10 mg by mouth every other day. Bedtime)   AUVI-Q 0.3 MG/0.3ML SOAJ injection Inject 0.3 mg into the muscle as needed for anaphylaxis.   azelastine (ASTELIN) 0.1 % nasal spray Place 1-2 sprays into both nostrils 2 (two) times daily. (Patient taking differently: Place 1-2 sprays into both nostrils daily as needed for  rhinitis or allergies.)   busPIRone (BUSPAR) 7.5 MG tablet Take 1 tablet once daily at night; may take 1 additional tablet daily if needed for anxiety. (Patient taking differently: Take 7.5 mg by mouth as needed. Take 1 tablet once daily at night; may take 1 additional tablet daily if needed for anxiety.)   Cholecalciferol 25 MCG (1000 UT) capsule Take 1,000 Units by mouth daily.   diphenhydrAMINE (BENADRYL) 2 % cream Apply 1 application topically 2 (two) times daily as needed for itching.   ELDERBERRY PO Take 1 capsule by mouth daily.   ezetimibe (ZETIA) 10 MG tablet Take 1 tablet (10 mg total) by mouth daily.   fluticasone (FLONASE) 50 MCG/ACT nasal spray Place 1-2 sprays into both nostrils daily. (Patient taking differently: Place 1-2 sprays into both nostrils daily as needed for allergies or rhinitis.)   Fluticasone-Umeclidin-Vilant (TRELEGY ELLIPTA) 200-62.5-25 MCG/ACT AEPB Inhale 1 puff into the lungs daily. (Patient taking differently: Inhale 1 puff into the lungs every morning.)   hydrocortisone cream 1 % Apply 1 application topically daily as needed for itching.   hydrOXYzine (VISTARIL) 25 MG capsule TAKE 1 CAPSULE (25 MG TOTAL) BY MOUTH AT BEDTIME AS NEEDED FOR UP TO 30 DOSES (DIFFICULTY SLEEPING).   ibuprofen (ADVIL) 200 MG tablet Take 400 mg by mouth every 6 (six) hours as needed for moderate pain.   ipratropium-albuterol (DUONEB) 0.5-2.5 (3) MG/3ML SOLN Take 3 mLs by nebulization every 6 (six) hours as needed (wheeze, SOB, cough variant asthma).   levocetirizine (XYZAL)  5 MG tablet Take 1 tablet (5 mg total) by mouth every evening.   losartan (COZAAR) 25 MG tablet Take 1 tablet (25 mg total) by mouth every morning.   montelukast (SINGULAIR) 10 MG tablet Take 1 tablet (10 mg total) by mouth every evening.   Olopatadine HCl (PATADAY) 0.2 % SOLN Place 1 drop into affected eye daily. (Patient taking differently: Place 1 drop into both eyes daily as needed (itching).)   ondansetron  (ZOFRAN-ODT) 4 MG disintegrating tablet Take 1 tablet (4 mg total) by mouth every 8 (eight) hours as needed for nausea or vomiting.   pregabalin (LYRICA) 50 MG capsule Take 1 capsule (50 mg total) by mouth 3 (three) times daily.   tamoxifen (NOLVADEX) 20 MG tablet Take 1 tablet (20 mg total) by mouth daily.   No facility-administered encounter medications on file as of 02/13/2022.     Past Medical History:  Diagnosis Date   Allergy    Anxiety    Asthma    Breast cancer ([place])    INVASIVE MAMMARY CARCINOMA- left   Family history of prostate cancer    Fibroid uterus 11/15/2014   Hyperlipidemia    Hypertension    Menorrhagia with irregular cycle 11/15/2014   Pneumonia    Pre-diabetes    Shortness of breath dyspnea     Past Surgical History:  Procedure Laterality Date   ABDOMINAL HYSTERECTOMY N/A 11/15/2014   Procedure: HYSTERECTOMY ABDOMINAL/BILATERAL SALPINGECTOMY ;  Surgeon: Will Bonnet, MD;  Location: [facility] ORS;  Service: Gynecology;  Laterality: N/A;   BREAST BIOPSY Right    2019 negative   BREAST BIOPSY Left 03/09/2021   u/s biopsy, 12-1 o'clock, VENUS" clip-path pending   BREAST LUMPECTOMY,RADIO FREQ LOCALIZER,AXILLARY SENTINEL LYMPH NODE BIOPSY Left 03/28/2021   Procedure: BREAST LUMPECTOMY,RADIO FREQ LOCALIZER,AXILLARY SENTINEL LYMPH NODE BIOPSY;  Surgeon: Jules Husbands, MD;  Location: [facility]S;  Service: General;  Laterality: Left;   BREAST RECONSTRUCTION WITH PLACEMENT OF TISSUE EXPANDER AND FLEX HD (ACELLULAR HYDRATED DERMIS) Bilateral 05/15/2021   Procedure: BILATERAL BREAST RECONSTRUCTION WITH PLACEMENT OF TISSUE EXPANDER AND FLEX HD (ACELLULAR HYDRATED DERMIS);  Surgeon: Cindra Presume, MD;  Location: [facility];  Service: Plastics;  Laterality: Bilateral;   CESAREAN SECTION  1990   COLONOSCOPY WITH PROPOFOL N/A 01/04/2022   Procedure: COLONOSCOPY WITH PROPOFOL;  Surgeon: [name], MD;  Location[facility];  Service: Gastroenterology;  Laterality: N/A;    COLPOSCOPY  07/28/2018   CYSTOSCOPY N/A 11/15/2014   Procedure: CYSTOSCOPY;  Surgeon: Will Bonnet, MD;  Location: [facility]S;  Service: Gynecology;  Laterality: N/A;   DILATION AND CURETTAGE OF UTERUS     GANGLION CYST EXCISION Left    REMOVAL OF BILATERAL TISSUE EXPANDERS WITH PLACEMENT OF BILATERAL BREAST IMPLANTS Bilateral 10/02/2021   Procedure: REMOVAL OF BILATERAL TISSUE EXPANDERS WITH PLACEMENT OF BILATERAL BREAST IMPLANTS;  Surgeon: Cindra Presume, MD;  Location: [facility];  Service: Plastics;  Laterality: Bilateral;   TOTAL MASTECTOMY Bilateral 05/15/2021   Procedure: TOTAL MASTECTOMY, Bilateral Simple Mastectomy, skin sparing;  Surgeon: Jules Husbands, MD;  Location: [facility]S;  Service: General;  Laterality: Bilateral;  Provider requesting for 2 hours / 120 min for procedure.   UTERINE FIBROID SURGERY      Family History  Problem Relation Age of Onset   Congestive Heart Failure Mother    Diabetes Mother    Hypertension Mother    Diabetes Maternal Grandmother    Congestive Heart Failure Maternal Grandfather  Breast cancer Neg Hx     Social History   Social History Narrative   Works for [employer]; never smoked; no alcohol; 2 children [25 and 31y- at 2022]. Lives with husband.      Review of Systems General: Denies fevers, chills, weight loss CV: Denies chest pain, shortness of breath, palpitations Breasts: No issues other than contour abnormalities on the medial aspects of both breast and on the lateral aspect of the left breast  Physical Exam    02/13/2022    1:04 PM 01/04/2022    8:26 AM 01/04/2022    8:16 AM  Vitals with BMI  Height '5\' 0"'$     Weight 179 lbs 3 oz    BMI 35    Systolic  030 092  Diastolic  73 70  Pulse 88 66 60    General:  No acute distress,  Alert and oriented, Non-Toxic, Normal speech and affect Breast: Patient has bilateral mastectomies with well-healed transverse scars on each chest.  She has very nice symmetry  however there are some very small contour irregularities on the medial aspects of both breasts and on the lateral aspect of the left breast. Abdomen: The patient has a large abdomen with sufficient fat for harvesting for fat grafting.  Assessment/Plan Breast cancer: We will schedule for revision of reconstruction using fat grafting.  We discussed the procedure at length including where the fat would be harvested how the fat would be injected, the likelihood of her needing more than 1 procedure to achieve her aesthetic goals.  The risks of bleeding, infection, and scarring were discussed.  I told her that she will need to wear compressive garment after harvesting the fat much like any liposuction procedure.  She would like me to schedule this procedure by the end of the year.  Camillia Herter 02/13/2022, 1:23 PM

## 2022-02-15 DIAGNOSIS — J3089 Other allergic rhinitis: Secondary | ICD-10-CM | POA: Diagnosis not present

## 2022-02-15 DIAGNOSIS — J301 Allergic rhinitis due to pollen: Secondary | ICD-10-CM | POA: Diagnosis not present

## 2022-02-15 DIAGNOSIS — J3081 Allergic rhinitis due to animal (cat) (dog) hair and dander: Secondary | ICD-10-CM | POA: Diagnosis not present

## 2022-02-22 DIAGNOSIS — J3089 Other allergic rhinitis: Secondary | ICD-10-CM | POA: Diagnosis not present

## 2022-02-22 DIAGNOSIS — J301 Allergic rhinitis due to pollen: Secondary | ICD-10-CM | POA: Diagnosis not present

## 2022-02-26 ENCOUNTER — Ambulatory Visit (INDEPENDENT_AMBULATORY_CARE_PROVIDER_SITE_OTHER): Payer: 59 | Admitting: Psychologist

## 2022-02-26 DIAGNOSIS — F4322 Adjustment disorder with anxiety: Secondary | ICD-10-CM

## 2022-02-26 NOTE — Progress Notes (Signed)
[facility] Counselor/Therapist Progress Note  Patient ID: Tracey Huff, MRN: [id number],    Date: 02/26/2022  Time Spent: 03:05 pm to 03:46 pm; total time: 41 minutes   This session was held via in person. The patient consented to in-person therapy and was in the clinician's office. Limits of confidentiality were discussed with the patient.   Treatment Type: Individual Therapy  Reported Symptoms: Denied symptoms  Mental Status Exam: Appearance:  Well Groomed     Behavior: Appropriate  Motor: Normal  Speech/Language:  Clear and Coherent  Affect: Appropriate  Mood: normal  Thought process: normal  Thought content:   WNL  Sensory/Perceptual disturbances:   WNL  Orientation: oriented to person, place, and time/date  Attention: Good  Concentration: Good  Memory: WNL  Fund of knowledge:  Good  Insight:   Fair  Judgment:  Good  Impulse Control: Good   Risk Assessment: Danger to Self:  No Self-injurious Behavior: No Danger to Others: No Duty to Warn:no Physical Aggression / Violence:No  Access to Firearms a concern: No  Gang Involvement:No   Subjective: Beginning the session, patient stated that she has been doing well and reflected on what has assisted her. From there, she shared some of the journaling that she has been doing. Specifically, she spent the session reflecting on the scars she has experiences as a result of surgeries she has had done for breast cancer. She spent the session reflecting on how she has grown in the last year from when she was initially diagnosed with breast cancer. She talked about how she is not allowing the scars to define her. She processed thoughts and emotions. She asked to follow up. She denied suicidal and homicidal ideation.   Interventions:  Worked on developing a therapeutic relationship using active listening and reflective statements. Provided emotional support using empathy and validation. Used summary statements.  Normalized and validated expressed thoughts and emotions. Used summary and reflective statements. Explored how her spouse has been doing since the last session. Praised patient for spouse doing better. Praised patient for completing  homework. Identified goals for the session. Spent time reflecting on patient's writings. Used socratic questions to assist the patient gain insight into self. Used metaphors that aligned with patient's story. Explored how patient has grown since she was diagnosed a year ago with cancer. Processed thoughts and emotions. Explored next steps for patient's growth. Validated experience. Reflected on growth in counseling. Discussed next steps for counseling.  Provided empathic statements. Assessed for suicidal and homicidal ideation.   Homework: Continue to journal  Next Session: Review homework, process emotions from journal, and emotional support.  Diagnosis: F43.22 adjustment disorder with anxiety   Plan:   Goals Work through the grieving process and face reality of own death Accept emotional support from others around them Live life to the fullest, event though time may be limited Become as knowledgeable about the medical condition  Reduce fear, anxiety about the health condition  Accept the illness Accept the role of psychological and behavioral factors  Stabilize anxiety level wile increasing ability to function Learn and implement coping skills that result in a reduction of anxiety  Alleviate depressive symptoms Recognize, accept, and cope with depressive feelings Develop healthy thinking patterns Develop healthy interpersonal relationships  Objectives target date for all objectives is 07/10/2022 Identify feelings associated with the illness Family members share with each other feelings Identify the losses or limitations that have been experienced Verbalize acceptance of the reality of the medical condition Commit  to learning and implement a proactive  approach to managing personal stresses Verbalize an understanding of the medical condition Work with therapist to develop a plan for coping with stress Learn and implement skills for managing stress Engage in social, productive activities that are possible Engage in faith based activities implement positive imagery Identify coping skills and sources of emotional support Patient's partner and family members verbalize their fears regarding severity of health condition Identify sources of emotional distress  Learning and implement calming skills to reduce overall anxiety Learn and implement problem solving strategies Identify and engage in pleasant activities Learning and implement personal and interpersonal skills to reduce anxiety and improve interpersonal relationships Learn to accept limitations in life and commit to tolerating, rather than avoiding, unpleasant emotions while accomplishing meaningful goals Identify major life conflicts from the past and present that form the basis for present anxiety Learn and implement behavioral strategies Verbalize an understanding and resolution of current interpersonal problems Learn and implement problem solving and decision making skills Learn and implement conflict resolution skills to resolve interpersonal problems Verbalize an understanding of healthy and unhealthy emotions verbalize insight into how past relationships may be influence current experiences with depression Use mindfulness and acceptance strategies and increase value based behavior  Increase hopeful statements about the future.   Interventions Teach about stress and ways to handle stress Assist the patient in developing a coping action plan for stressors Conduct skills based training for coping strategies Train problem focused skills Sort out what activities the individual can do Encourage patient to rely upon his/her spiritual faith Teach the patient to use guided  imagery Probe and evaluate family's ability to provide emotional support Allow family to share their fears Assist the patient in identifying, sorting through, and verbalizing the various feelings generated by his/her medical condition Meet with family members  Ask patient list out limitations  Use stress inoculation training  Use Acceptance and Commitment Therapy to help client accept uncomfortable realities in order to accomplish value-consistent goals Reinforce the client's insight into the role of his/her past emotional pain and present anxiety  Discuss examples demonstrating that unrealistic worry overestimates the probability of threats and underestimate patient's ability  Assist the patient in analyzing his or her worries Help patient understand that avoidance is reinforcing  Behavioral activation help the client explore the relationship, nature of the dispute,  Help the client develop new interpersonal skills and relationships Conduct Problem so living therapy Teach conflict resolution skills Use a process-experiential approach Conduct TLDP Conduct ACT  The patient and clinician reviewed the treatment plan on 08/07/2021. The patient approved of the treatment plan.   Conception Chancy, PsyD

## 2022-03-06 ENCOUNTER — Other Ambulatory Visit (HOSPITAL_COMMUNITY): Payer: Self-pay

## 2022-03-06 ENCOUNTER — Other Ambulatory Visit: Payer: Self-pay | Admitting: Internal Medicine

## 2022-03-06 ENCOUNTER — Other Ambulatory Visit: Payer: Self-pay | Admitting: Nurse Practitioner

## 2022-03-06 DIAGNOSIS — Z79899 Other long term (current) drug therapy: Secondary | ICD-10-CM

## 2022-03-06 DIAGNOSIS — I10 Essential (primary) hypertension: Secondary | ICD-10-CM

## 2022-03-06 MED ORDER — TAMOXIFEN CITRATE 20 MG PO TABS
20.0000 mg | ORAL_TABLET | Freq: Every day | ORAL | 1 refills | Status: DC
  Filled 2022-03-06 – 2022-04-10 (×2): qty 90, 90d supply, fill #0
  Filled 2022-07-17: qty 90, 90d supply, fill #1

## 2022-03-06 MED ORDER — MONTELUKAST SODIUM 10 MG PO TABS
10.0000 mg | ORAL_TABLET | Freq: Every evening | ORAL | 6 refills | Status: DC
  Filled 2022-03-06: qty 30, 30d supply, fill #0

## 2022-03-06 MED ORDER — LOSARTAN POTASSIUM 25 MG PO TABS
25.0000 mg | ORAL_TABLET | ORAL | 0 refills | Status: DC
  Filled 2022-03-06: qty 90, 90d supply, fill #0

## 2022-03-06 MED ORDER — FLUTICASONE PROPIONATE 50 MCG/ACT NA SUSP
1.0000 | Freq: Every day | NASAL | 6 refills | Status: DC
  Filled 2022-03-06: qty 16, 30d supply, fill #0

## 2022-03-06 MED ORDER — AZELASTINE HCL 0.1 % NA SOLN
1.0000 | Freq: Two times a day (BID) | NASAL | 6 refills | Status: DC
  Filled 2022-03-06: qty 30, 25d supply, fill #0

## 2022-03-06 NOTE — Telephone Encounter (Signed)
Requested Prescriptions  Pending Prescriptions Disp Refills  . losartan (COZAAR) 25 MG tablet 90 tablet 0    Sig: Take 1 tablet (25 mg total) by mouth every morning.     Cardiovascular:  Angiotensin Receptor Blockers Failed - 03/06/2022  1:25 PM      Failed - Last BP in normal range    BP Readings from Last 1 Encounters:  02/13/22 (!) 158/92         Passed - Cr in normal range and within 180 days    Creat  Date Value Ref Range Status  11/07/2021 0.87 0.50 - 1.03 mg/dL Final         Passed - K in normal range and within 180 days    Potassium  Date Value Ref Range Status  11/07/2021 4.5 3.5 - 5.3 mmol/L Final  08/24/2014 3.3 (L) mmol/L Final    Comment:    3.5-5.1 NOTE: New Reference Range  07/12/14          Passed - Patient is not pregnant      Passed - Valid encounter within last 6 months    Recent Outpatient Visits          3 weeks ago Swelling of skin   [facility] Delsa Grana, PA-C   2 months ago Carcinoma of upper-outer quadrant of left breast in female, estrogen receptor positive [facility])   [facility] Bo Merino, FNP   3 months ago Essential hypertension   [facility] Bo Merino, FNP   10 months ago Essential hypertension   [facility] Bo Merino, FNP   1 year ago Breast lump   [facility] Myles Gip, DO      Future Appointments            In 2 months Reece Packer, Myna Hidalgo, [facility], [place]   In 2 months Ralene Bathe, MD [place]

## 2022-03-08 DIAGNOSIS — J3089 Other allergic rhinitis: Secondary | ICD-10-CM | POA: Diagnosis not present

## 2022-03-08 DIAGNOSIS — J3081 Allergic rhinitis due to animal (cat) (dog) hair and dander: Secondary | ICD-10-CM | POA: Diagnosis not present

## 2022-03-08 DIAGNOSIS — J301 Allergic rhinitis due to pollen: Secondary | ICD-10-CM | POA: Diagnosis not present

## 2022-03-11 ENCOUNTER — Other Ambulatory Visit (HOSPITAL_COMMUNITY): Payer: Self-pay

## 2022-03-14 ENCOUNTER — Telehealth: Payer: Self-pay | Admitting: *Deleted

## 2022-03-14 NOTE — Telephone Encounter (Signed)
Auth request submitted via [employer] portal for [id number], [id number], [id number]  Prior authorization and predetermination are not a guarantee of benefits.Please contact the benefits department to verify coverage and benefit information for the member. Transaction Submission Confirmation Case ID# [id number] was submitted on 03-14-2022 by [email or web address]

## 2022-03-15 DIAGNOSIS — J301 Allergic rhinitis due to pollen: Secondary | ICD-10-CM | POA: Diagnosis not present

## 2022-03-15 DIAGNOSIS — J3089 Other allergic rhinitis: Secondary | ICD-10-CM | POA: Diagnosis not present

## 2022-03-15 DIAGNOSIS — J3081 Allergic rhinitis due to animal (cat) (dog) hair and dander: Secondary | ICD-10-CM | POA: Diagnosis not present

## 2022-03-18 ENCOUNTER — Other Ambulatory Visit (HOSPITAL_COMMUNITY): Payer: Self-pay

## 2022-03-18 MED ORDER — TRELEGY ELLIPTA 200-62.5-25 MCG/ACT IN AEPB
1.0000 | INHALATION_SPRAY | Freq: Every day | RESPIRATORY_TRACT | 6 refills | Status: DC
Start: 2022-03-18 — End: 2023-03-26
  Filled 2022-03-18: qty 60, 30d supply, fill #0

## 2022-03-18 MED ORDER — ALBUTEROL SULFATE HFA 108 (90 BASE) MCG/ACT IN AERS
1.0000 | INHALATION_SPRAY | RESPIRATORY_TRACT | 0 refills | Status: DC | PRN
  Filled 2022-03-18: qty 6.7, 17d supply, fill #0

## 2022-03-20 ENCOUNTER — Other Ambulatory Visit: Payer: Self-pay

## 2022-03-20 ENCOUNTER — Ambulatory Visit: Payer: 59 | Admitting: Surgery

## 2022-03-20 ENCOUNTER — Encounter: Payer: Self-pay | Admitting: Surgery

## 2022-03-20 VITALS — BP 136/81 | HR 87 | Temp 99.0°F | Ht 60.0 in | Wt 183.0 lb

## 2022-03-20 DIAGNOSIS — C50812 Malignant neoplasm of overlapping sites of left female breast: Secondary | ICD-10-CM | POA: Diagnosis not present

## 2022-03-20 DIAGNOSIS — Z170 Estrogen receptor positive status [ER+]: Secondary | ICD-10-CM | POA: Diagnosis not present

## 2022-03-20 NOTE — Patient Instructions (Signed)
We will contact you in October of 2024 to schedule a follow up appointment.

## 2022-03-21 DIAGNOSIS — J3089 Other allergic rhinitis: Secondary | ICD-10-CM | POA: Diagnosis not present

## 2022-03-21 DIAGNOSIS — J3081 Allergic rhinitis due to animal (cat) (dog) hair and dander: Secondary | ICD-10-CM | POA: Diagnosis not present

## 2022-03-21 DIAGNOSIS — J301 Allergic rhinitis due to pollen: Secondary | ICD-10-CM | POA: Diagnosis not present

## 2022-03-21 NOTE — Progress Notes (Signed)
Outpatient Surgical Follow Up  03/21/2022  Tracey Huff is an 51 y.o. female.   Chief Complaint  Patient presents with   Follow-up    6 mo f/u breast cancer    HPI: Tracey Huff is following up after bilateral mastectomies and recon on 05/15/21  She is doing very welll.  Some bilateral tingling on finger tips.  She has been followed by plastics and will get fat grafting to improve cosmesis. No major issues.  Tolerating tamoxifen   Past Medical History:  Diagnosis Date   Allergy    Anxiety    Asthma    Breast cancer ([place])    INVASIVE MAMMARY CARCINOMA- left   Family history of prostate cancer    Fibroid uterus 11/15/2014   Hyperlipidemia    Hypertension    Menorrhagia with irregular cycle 11/15/2014   Pneumonia    Pre-diabetes    Shortness of breath dyspnea     Past Surgical History:  Procedure Laterality Date   ABDOMINAL HYSTERECTOMY N/A 11/15/2014   Procedure: HYSTERECTOMY ABDOMINAL/BILATERAL SALPINGECTOMY ;  Surgeon: Will Bonnet, MD;  Location: [facility] ORS;  Service: Gynecology;  Laterality: N/A;   BREAST BIOPSY Right    2019 negative   BREAST BIOPSY Left 03/09/2021   u/s biopsy, 12-1 o'clock, VENUS" clip-path pending   BREAST LUMPECTOMY,RADIO FREQ LOCALIZER,AXILLARY SENTINEL LYMPH NODE BIOPSY Left 03/28/2021   Procedure: BREAST LUMPECTOMY,RADIO FREQ LOCALIZER,AXILLARY SENTINEL LYMPH NODE BIOPSY;  Surgeon: Jules Husbands, MD;  Location: [facility] ORS;  Service: General;  Laterality: Left;   BREAST RECONSTRUCTION WITH PLACEMENT OF TISSUE EXPANDER AND FLEX HD (ACELLULAR HYDRATED DERMIS) Bilateral 05/15/2021   Procedure: BILATERAL BREAST RECONSTRUCTION WITH PLACEMENT OF TISSUE EXPANDER AND FLEX HD (ACELLULAR HYDRATED DERMIS);  Surgeon: Cindra Presume, MD;  Location: [facility] ORS;  Service: Plastics;  Laterality: Bilateral;   CESAREAN SECTION  1990   COLONOSCOPY WITH PROPOFOL N/A 01/04/2022   Procedure: COLONOSCOPY WITH PROPOFOL;  Surgeon: [name], MD;  Location: [facility]  ENDOSCOPY;  Service: Gastroenterology;  Laterality: N/A;   COLPOSCOPY  07/28/2018   CYSTOSCOPY N/A 11/15/2014   Procedure: CYSTOSCOPY;  Surgeon: Will Bonnet, MD;  Location: [facility] ORS;  Service: Gynecology;  Laterality: N/A;   DILATION AND CURETTAGE OF UTERUS     GANGLION CYST EXCISION Left    REMOVAL OF BILATERAL TISSUE EXPANDERS WITH PLACEMENT OF BILATERAL BREAST IMPLANTS Bilateral 10/02/2021   Procedure: REMOVAL OF BILATERAL TISSUE EXPANDERS WITH PLACEMENT OF BILATERAL BREAST IMPLANTS;  Surgeon: Cindra Presume, MD;  Location: [facility];  Service: Plastics;  Laterality: Bilateral;   TOTAL MASTECTOMY Bilateral 05/15/2021   Procedure: TOTAL MASTECTOMY, Bilateral Simple Mastectomy, skin sparing;  Surgeon: Jules Husbands, MD;  Location: [facility] ORS;  Service: General;  Laterality: Bilateral;  Provider requesting for 2 hours / 120 min for procedure.   UTERINE FIBROID SURGERY      Family History  Problem Relation Age of Onset   Congestive Heart Failure Mother    Diabetes Mother    Hypertension Mother    Diabetes Maternal Grandmother    Congestive Heart Failure Maternal Grandfather    Breast cancer Neg Hx     Social History:  reports that she has never smoked. She has never been exposed to tobacco smoke. She has never used smokeless tobacco. She reports that she does not currently use alcohol. She reports that she does not use drugs.  Allergies:  Allergies  Allergen Reactions   Macadamia Nut Oil Shortness Of Breath   Apple Juice  Nausea And Vomiting   [place] Nut (Berthollefia [place]) Skin Test Nausea And Vomiting and Swelling    Tongue swelling   Carrot Oil Nausea And Vomiting    Can not cooked raw carrots   Fruit & Vegetable Daily [Nutritional Supplements] Nausea And Vomiting    Cannot tolerate apples, peaches, plums, and nectarines   Kiwi Extract Nausea And Vomiting   Peach [Prunus Persica] Nausea And Vomiting    Medications reviewed.    ROS Full ROS  performed and is otherwise negative other than what is stated in HPI   BP 136/81   Pulse 87   Temp 99 F (37.2 C) (Oral)   Ht 5' (1.524 m)   Wt 183 lb (83 kg)   LMP 11/15/2014   SpO2 98%   BMI 35.74 kg/m   Physical Exam Vitals and nursing note reviewed. Exam conducted with a chaperone present.  Constitutional:      Appearance: Normal appearance.  Cardiovascular:     Rate and Rhythm: Normal rate and regular rhythm.     Heart sounds: No murmur heard. Pulmonary:     Effort: Pulmonary effort is normal. No respiratory distress.     Breath sounds: Normal breath sounds. No stridor. No wheezing, rhonchi or rales.     Comments: Chest wall bilateral mastectomy scar with reconstruction, no evidence of infection, seroma or complications. NO evidence of axillary LAD Chest:     Chest wall: No tenderness.  Abdominal:     General: Abdomen is flat. There is no distension.     Palpations: Abdomen is soft. There is no mass.     Tenderness: There is no abdominal tenderness.     Hernia: No hernia is present.  Musculoskeletal:        General: Normal range of motion.     Cervical back: Normal range of motion and neck supple. No rigidity or tenderness.  Skin:    General: Skin is warm and dry.  Neurological:     Mental Status: She is alert.  Psychiatric:        Mood and Affect: Mood normal.        Behavior: Behavior normal.        Thought Content: Thought content normal.        Judgment: Judgment normal.      Assessment/Plan: 1. Malignant neoplasm of overlapping sites of left breast in female, estrogen receptor positive ([place]) . Malignant neoplasm of overlapping sites of left breast in female, estrogen receptor positive ([place]) Doing very well w/o complications No evidence of recurrence. I will like to see her in 1 year  for PE  Caroleen Hamman, MD [place] Surgeon

## 2022-03-26 ENCOUNTER — Ambulatory Visit (INDEPENDENT_AMBULATORY_CARE_PROVIDER_SITE_OTHER): Payer: 59 | Admitting: Psychologist

## 2022-03-26 DIAGNOSIS — F4322 Adjustment disorder with anxiety: Secondary | ICD-10-CM | POA: Diagnosis not present

## 2022-03-26 NOTE — Progress Notes (Signed)
[place] Counselor/Therapist Progress Note  Patient ID: BEENA CATANO, MRN: [id number],    Date: 03/26/2022  Time Spent: 03:02 pm to 03:44 pm; total time: 42 minutes   This session was held via in person. The patient consented to in-person therapy and was in the clinician's office. Limits of confidentiality were discussed with the patient.   Treatment Type: Individual Therapy  Reported Symptoms: Denied symptoms  Mental Status Exam: Appearance:  Well Groomed     Behavior: Appropriate  Motor: Normal  Speech/Language:  Clear and Coherent  Affect: Appropriate  Mood: normal  Thought process: normal  Thought content:   WNL  Sensory/Perceptual disturbances:   WNL  Orientation: oriented to person, place, and time/date  Attention: Good  Concentration: Good  Memory: WNL  Fund of knowledge:  Good  Insight:   Fair  Judgment:  Good  Impulse Control: Good   Risk Assessment: Danger to Self:  No Self-injurious Behavior: No Danger to Others: No Duty to Warn:no Physical Aggression / Violence:No  Access to Firearms a concern: No  Gang Involvement:No   Subjective: Beginning the session, patient stated that she has been doing well while reflecting on her journaling that she shared. She disclosed that it has been a year since her diagnosis and she spent time reflecting on what the last year has looked like to her. Talking, she discussed how she has grown in the past year into a new person. She also disclosed that she is attempting to go into ministry and processed how to incorporate ministry with her calling for breast cancer awareness for women. She reflected on how she has learned to open up towards others through journaling. She talked about next steps in counseling due to improvement. She processed thoughts and emotions. She asked to follow up. She denied suicidal and homicidal ideation.   Interventions:  Worked on developing a therapeutic relationship using active  listening and reflective statements. Provided emotional support using empathy and validation. Used summary statements. Normalized and validated expressed thoughts and emotions. Praised the patient for doing well and explored what has assisted the patient in doing well. Reviewed events since the last session.  Processed patient's journal. Used socratic questions to assist the patient gain insight into self. Challenged some of the thoughts expressed by the patient. Explored and processed how cancer has impacted patient's identity. Reflected on how patient can combine her call to ministry with breast cancer awareness. Processed patient's growth and what has assisted her. Explored what patient has learned about herself. Processed thoughts and emotions. Discussed next steps for counseling.  Provided empathic statements. Assessed for suicidal and homicidal ideation.   Homework: Continue to journal  Next Session: Review homework, process emotions from journal, and emotional support.  Diagnosis: F43.22 adjustment disorder with anxiety   Plan:   Goals Work through the grieving process and face reality of own death Accept emotional support from others around them Live life to the fullest, event though time may be limited Become as knowledgeable about the medical condition  Reduce fear, anxiety about the health condition  Accept the illness Accept the role of psychological and behavioral factors  Stabilize anxiety level wile increasing ability to function Learn and implement coping skills that result in a reduction of anxiety  Alleviate depressive symptoms Recognize, accept, and cope with depressive feelings Develop healthy thinking patterns Develop healthy interpersonal relationships  Objectives target date for all objectives is 07/10/2022 Identify feelings associated with the illness Family members share with each other feelings  Identify the losses or limitations that have been  experienced Verbalize acceptance of the reality of the medical condition Commit to learning and implement a proactive approach to managing personal stresses Verbalize an understanding of the medical condition Work with therapist to develop a plan for coping with stress Learn and implement skills for managing stress Engage in social, productive activities that are possible Engage in faith based activities implement positive imagery Identify coping skills and sources of emotional support Patient's partner and family members verbalize their fears regarding severity of health condition Identify sources of emotional distress  Learning and implement calming skills to reduce overall anxiety Learn and implement problem solving strategies Identify and engage in pleasant activities Learning and implement personal and interpersonal skills to reduce anxiety and improve interpersonal relationships Learn to accept limitations in life and commit to tolerating, rather than avoiding, unpleasant emotions while accomplishing meaningful goals Identify major life conflicts from the past and present that form the basis for present anxiety Learn and implement behavioral strategies Verbalize an understanding and resolution of current interpersonal problems Learn and implement problem solving and decision making skills Learn and implement conflict resolution skills to resolve interpersonal problems Verbalize an understanding of healthy and unhealthy emotions verbalize insight into how past relationships may be influence current experiences with depression Use mindfulness and acceptance strategies and increase value based behavior  Increase hopeful statements about the future.   Interventions Teach about stress and ways to handle stress Assist the patient in developing a coping action plan for stressors Conduct skills based training for coping strategies Train problem focused skills Sort out what activities the  individual can do Encourage patient to rely upon his/her spiritual faith Teach the patient to use guided imagery Probe and evaluate family's ability to provide emotional support Allow family to share their fears Assist the patient in identifying, sorting through, and verbalizing the various feelings generated by his/her medical condition Meet with family members  Ask patient list out limitations  Use stress inoculation training  Use Acceptance and Commitment Therapy to help client accept uncomfortable realities in order to accomplish value-consistent goals Reinforce the client's insight into the role of his/her past emotional pain and present anxiety  Discuss examples demonstrating that unrealistic worry overestimates the probability of threats and underestimate patient's ability  Assist the patient in analyzing his or her worries Help patient understand that avoidance is reinforcing  Behavioral activation help the client explore the relationship, nature of the dispute,  Help the client develop new interpersonal skills and relationships Conduct Problem so living therapy Teach conflict resolution skills Use a process-experiential approach Conduct TLDP Conduct ACT  The patient and clinician reviewed the treatment plan on 08/07/2021. The patient approved of the treatment plan.   Conception Chancy, PsyD

## 2022-04-05 DIAGNOSIS — J3089 Other allergic rhinitis: Secondary | ICD-10-CM | POA: Diagnosis not present

## 2022-04-05 DIAGNOSIS — J3081 Allergic rhinitis due to animal (cat) (dog) hair and dander: Secondary | ICD-10-CM | POA: Diagnosis not present

## 2022-04-05 DIAGNOSIS — J301 Allergic rhinitis due to pollen: Secondary | ICD-10-CM | POA: Diagnosis not present

## 2022-04-10 ENCOUNTER — Other Ambulatory Visit (HOSPITAL_COMMUNITY): Payer: Self-pay

## 2022-04-10 ENCOUNTER — Other Ambulatory Visit: Payer: Self-pay

## 2022-04-12 DIAGNOSIS — J3081 Allergic rhinitis due to animal (cat) (dog) hair and dander: Secondary | ICD-10-CM | POA: Diagnosis not present

## 2022-04-12 DIAGNOSIS — J301 Allergic rhinitis due to pollen: Secondary | ICD-10-CM | POA: Diagnosis not present

## 2022-04-12 DIAGNOSIS — J3089 Other allergic rhinitis: Secondary | ICD-10-CM | POA: Diagnosis not present

## 2022-04-17 ENCOUNTER — Ambulatory Visit (INDEPENDENT_AMBULATORY_CARE_PROVIDER_SITE_OTHER): Payer: 59 | Admitting: Physician Assistant

## 2022-04-17 ENCOUNTER — Encounter: Payer: Self-pay | Admitting: Internal Medicine

## 2022-04-17 ENCOUNTER — Inpatient Hospital Stay: Payer: 59 | Admitting: Internal Medicine

## 2022-04-17 ENCOUNTER — Encounter: Payer: Self-pay | Admitting: Physician Assistant

## 2022-04-17 ENCOUNTER — Other Ambulatory Visit (HOSPITAL_COMMUNITY): Payer: Self-pay

## 2022-04-17 VITALS — BP 130/87 | HR 81 | Temp 98.2°F | Resp 16 | Wt 181.7 lb

## 2022-04-17 VITALS — BP 150/85 | HR 80

## 2022-04-17 DIAGNOSIS — Z170 Estrogen receptor positive status [ER+]: Secondary | ICD-10-CM

## 2022-04-17 DIAGNOSIS — C50412 Malignant neoplasm of upper-outer quadrant of left female breast: Secondary | ICD-10-CM

## 2022-04-17 MED ORDER — OXYCODONE HCL 5 MG PO TABS
5.0000 mg | ORAL_TABLET | Freq: Four times a day (QID) | ORAL | 0 refills | Status: AC | PRN
  Filled 2022-04-17: qty 20, 5d supply, fill #0

## 2022-04-17 NOTE — Assessment & Plan Note (Signed)
#  Left breast cancer T8m0- G-2; ER/PR positive HER2 negative status BIL postmastectomy [positive for CHEK-2 mutation].  Oncotype recurrence score 16/LOW. On Tamoxifen [s/p hysterectomy [intact ovaries]; no role for mammograms. STABLE;  #Patient tolerated tamoxifen fairly well except for hot flashes.   # Hot flashes-grade 1 through 2- STABLE;  monitor for now.  If worse will consider pharmacologic therapy.  # DISPOSITION:  # follow up 6 months- MD; no labs- Dr.B

## 2022-04-17 NOTE — Progress Notes (Signed)
Patient ID: Tracey Huff, female    DOB: April 09, 1971, 51 y.o.   MRN: [id number]  Chief Complaint  Patient presents with   Pre-op Exam      ICD-10-CM   1. Carcinoma of upper-outer quadrant of left breast in female, estrogen receptor positive ([place])  C50.412    Z17.0        History of Present Illness: Tracey Huff is a 51 y.o.  female  with a history of left-sided breast cancer with bilateral reconstruction 05/15/2021 and implant exchange 10/02/2021 by Dr. Claudia Desanctis.  She presents for preoperative evaluation for upcoming procedure, revision of reconstruction with fat grafting, scheduled for 05/03/2022 with Dr.  Lovena Le .  The patient has not had problems with anesthesia.  No history of clotting, CVA, MI, or other severe cardiac/pulmonary disease.  No family history of blood clots or clotting disorder.  Hold vitamins and supplements 1 week before surgery.  Hold any NSAIDs 72 hours before surgery.  Hold maintenance tamoxifen 2 weeks before and after surgery.  She wants to discuss nipple areolar tattooing after this surgery.  Summary of Previous Visit: She was seen by Dr. Lovena Le on 02/13/2022 for post reconstruction follow-up in consult.  At that time, she had several areas of contour irregularities that she would like to address.  Discussed revision of reconstruction using fat grafting.  They also discussed that there would likely require at least 1 additional surgery afterwards for additional contouring.  The fat grafting would probably address the medial aspects of each breast and lateral aspect of left breast.  Job: Nurse tech.  PMH Significant for: Left-sided breast cancer, HTN, asthma.   Past Medical History: Allergies: Allergies  Allergen Reactions   Macadamia Nut Oil Shortness Of Breath   Apple Juice Nausea And Vomiting   [place] Nut (Berthollefia [place]) Skin Test Nausea And Vomiting and Swelling    Tongue swelling   Carrot Oil Nausea And Vomiting    Can not cooked raw  carrots   Fruit & Vegetable Daily [Nutritional Supplements] Nausea And Vomiting    Cannot tolerate apples, peaches, plums, and nectarines   Kiwi Extract Nausea And Vomiting   Peach [Prunus Persica] Nausea And Vomiting    Current Medications:  Current Outpatient Medications:    albuterol (PROVENTIL HFA) 108 (90 Base) MCG/ACT inhaler, Inhale 1 to 2 puffs every 4-6 hours as needed for cough/wheeze, Disp: 18 g, Rfl: 0   albuterol (PROVENTIL HFA) 108 (90 Base) MCG/ACT inhaler, Inhale 1-2 puffs into the lungs every 4 (four) to 6 (six) hours as needed., Disp: 6.7 g, Rfl: 0   Ascorbic Acid (VITAMIN C) 100 MG tablet, Take 100 mg by mouth daily., Disp: , Rfl:    atorvastatin (LIPITOR) 20 MG tablet, Take 1 tablet (20 mg total) by mouth at bedtime. (Patient taking differently: Take 10 mg by mouth every other day. Bedtime), Disp: 90 tablet, Rfl: 3   AUVI-Q 0.3 MG/0.3ML SOAJ injection, Inject 0.3 mg into the muscle as needed for anaphylaxis., Disp: , Rfl:    azelastine (ASTELIN) 0.1 % nasal spray, Place 1-2 sprays into both nostrils 2 (two) times daily., Disp: 30 mL, Rfl: 6   busPIRone (BUSPAR) 7.5 MG tablet, Take 1 tablet once daily at night; may take 1 additional tablet daily if needed for anxiety. (Patient taking differently: Take 7.5 mg by mouth as needed. Take 1 tablet once daily at night; may take 1 additional tablet daily if needed for anxiety.), Disp: 180 tablet, Rfl: 1  Cholecalciferol 25 MCG (1000 UT) capsule, Take 1,000 Units by mouth daily., Disp: , Rfl:    diphenhydrAMINE (BENADRYL) 2 % cream, Apply 1 application topically 2 (two) times daily as needed for itching., Disp: , Rfl:    ELDERBERRY PO, Take 1 capsule by mouth daily., Disp: , Rfl:    ezetimibe (ZETIA) 10 MG tablet, Take 1 tablet (10 mg total) by mouth daily., Disp: 90 tablet, Rfl: 3   fluticasone (FLONASE) 50 MCG/ACT nasal spray, Place 1-2 sprays into both nostrils daily., Disp: 16 g, Rfl: 6   Fluticasone-Umeclidin-Vilant (TRELEGY  ELLIPTA) 200-62.5-25 MCG/ACT AEPB, Inhale 1 puff into the lungs daily. (Patient taking differently: Inhale 1 puff into the lungs every morning.), Disp: 60 each, Rfl: 6   Fluticasone-Umeclidin-Vilant (TRELEGY ELLIPTA) 200-62.5-25 MCG/ACT AEPB, Inhale 1 puff into the lungs daily., Disp: 60 each, Rfl: 6   hydrocortisone cream 1 %, Apply 1 application topically daily as needed for itching., Disp: , Rfl:    hydrOXYzine (VISTARIL) 25 MG capsule, TAKE 1 CAPSULE (25 MG TOTAL) BY MOUTH AT BEDTIME AS NEEDED FOR UP TO 30 DOSES (DIFFICULTY SLEEPING)., Disp: 30 capsule, Rfl: 1   ibuprofen (ADVIL) 200 MG tablet, Take 400 mg by mouth every 6 (six) hours as needed for moderate pain., Disp: , Rfl:    ipratropium-albuterol (DUONEB) 0.5-2.5 (3) MG/3ML SOLN, Take 3 mLs by nebulization every 6 (six) hours as needed (wheeze, SOB, cough variant asthma)., Disp: 360 mL, Rfl: 1   levocetirizine (XYZAL) 5 MG tablet, Take 1 tablet (5 mg total) by mouth every evening., Disp: 30 tablet, Rfl: 6   losartan (COZAAR) 25 MG tablet, Take 1 tablet (25 mg total) by mouth every morning., Disp: 90 tablet, Rfl: 0   montelukast (SINGULAIR) 10 MG tablet, Take 1 tablet (10 mg total) by mouth every evening., Disp: 30 tablet, Rfl: 6   Olopatadine HCl (PATADAY) 0.2 % SOLN, Place 1 drop into affected eye daily. (Patient taking differently: Place 1 drop into both eyes daily as needed (itching).), Disp: 2.5 mL, Rfl: 6   ondansetron (ZOFRAN-ODT) 4 MG disintegrating tablet, Take 1 tablet (4 mg total) by mouth every 8 (eight) hours as needed for nausea or vomiting., Disp: 20 tablet, Rfl: 0   tamoxifen (NOLVADEX) 20 MG tablet, Take 1 tablet (20 mg total) by mouth daily., Disp: 90 tablet, Rfl: 1  Past Medical Problems: Past Medical History:  Diagnosis Date   Allergy    Anxiety    Asthma    Breast cancer ([place])    INVASIVE MAMMARY CARCINOMA- left   Family history of prostate cancer    Fibroid uterus 11/15/2014   Hyperlipidemia    Hypertension     Menorrhagia with irregular cycle 11/15/2014   Pneumonia    Pre-diabetes    Shortness of breath dyspnea     Past Surgical History: Past Surgical History:  Procedure Laterality Date   ABDOMINAL HYSTERECTOMY N/A 11/15/2014   Procedure: HYSTERECTOMY ABDOMINAL/BILATERAL SALPINGECTOMY ;  Surgeon: Will Bonnet, MD;  Location: [facility] ORS;  Service: Gynecology;  Laterality: N/A;   BREAST BIOPSY Right    2019 negative   BREAST BIOPSY Left 03/09/2021   u/s biopsy, 12-1 o'clock, VENUS" clip-path pending   BREAST LUMPECTOMY,RADIO FREQ LOCALIZER,AXILLARY SENTINEL LYMPH NODE BIOPSY Left 03/28/2021   Procedure: BREAST LUMPECTOMY,RADIO FREQ LOCALIZER,AXILLARY SENTINEL LYMPH NODE BIOPSY;  Surgeon: Jules Husbands, MD;  Location: [facility] ORS;  Service: General;  Laterality: Left;   BREAST RECONSTRUCTION WITH PLACEMENT OF TISSUE EXPANDER AND FLEX HD (ACELLULAR HYDRATED DERMIS)  Bilateral 05/15/2021   Procedure: BILATERAL BREAST RECONSTRUCTION WITH PLACEMENT OF TISSUE EXPANDER AND FLEX HD (ACELLULAR HYDRATED DERMIS);  Surgeon: Cindra Presume, MD;  Location: [facility] ORS;  Service: Plastics;  Laterality: Bilateral;   CESAREAN SECTION  1990   COLONOSCOPY WITH PROPOFOL N/A 01/04/2022   Procedure: COLONOSCOPY WITH PROPOFOL;  Surgeon: [name], MD;  Location: [facility] ENDOSCOPY;  Service: Gastroenterology;  Laterality: N/A;   COLPOSCOPY  07/28/2018   CYSTOSCOPY N/A 11/15/2014   Procedure: CYSTOSCOPY;  Surgeon: Will Bonnet, MD;  Location: [facility] ORS;  Service: Gynecology;  Laterality: N/A;   DILATION AND CURETTAGE OF UTERUS     GANGLION CYST EXCISION Left    REMOVAL OF BILATERAL TISSUE EXPANDERS WITH PLACEMENT OF BILATERAL BREAST IMPLANTS Bilateral 10/02/2021   Procedure: REMOVAL OF BILATERAL TISSUE EXPANDERS WITH PLACEMENT OF BILATERAL BREAST IMPLANTS;  Surgeon: Cindra Presume, MD;  Location: [facility];  Service: Plastics;  Laterality: Bilateral;   TOTAL MASTECTOMY Bilateral 05/15/2021   Procedure:  TOTAL MASTECTOMY, Bilateral Simple Mastectomy, skin sparing;  Surgeon: Jules Husbands, MD;  Location: [facility] ORS;  Service: General;  Laterality: Bilateral;  Provider requesting for 2 hours / 120 min for procedure.   UTERINE FIBROID SURGERY      Social History: Social History   Socioeconomic History   Marital status: Married    Spouse name: Fritz Pickerel   Number of children: 2   Years of education: Not on file   Highest education level: Not on file  Occupational History   Not on file  Tobacco Use   Smoking status: Never    Passive exposure: Never   Smokeless tobacco: Never  Vaping Use   Vaping Use: Never used  Substance and Sexual Activity   Alcohol use: Not Currently    Comment: occassional   Drug use: No   Sexual activity: Yes    Partners: Male    Birth control/protection: Surgical  Other Topics Concern   Not on file  Social History Narrative   Works for [employer]; never smoked; no alcohol; 2 children [25 and 31y- at 2022]. Lives with husband.    Social Determinants of Health   Financial Resource Strain: Low Risk  (07/09/2021)   Overall Financial Resource Strain (CARDIA)    Difficulty of Paying Living Expenses: Not very hard  Food Insecurity: No Food Insecurity (07/09/2021)   Hunger Vital Sign    Worried About Running Out of Food in the Last Year: Never true    Ran Out of Food in the Last Year: Never true  Transportation Needs: No Transportation Needs (07/09/2021)   PRAPARE - Hydrologist (Medical): No    Lack of Transportation (Non-Medical): No  Physical Activity: Inactive (07/09/2021)   Exercise Vital Sign    Days of Exercise per Week: 0 days    Minutes of Exercise per Session: 0 min  Stress: Stress Concern Present (07/09/2021)   [place]    Feeling of Stress : To some extent  Social Connections: Socially Integrated (07/09/2021)   Social Connection and Isolation Panel [NHANES]     Frequency of Communication with Friends and Family: Three times a week    Frequency of Social Gatherings with Friends and Family: Three times a week    Attends Religious Services: More than 4 times per year    Active Member of Clubs or Organizations: Yes    Attends Archivist Meetings: 1 to 4 times  per year    Marital Status: Married  Human resources officer Violence: Unknown (07/09/2021)   Humiliation, Afraid, Rape, and Kick questionnaire    Fear of Current or Ex-Partner: Patient refused    Emotionally Abused: Patient refused    Physically Abused: Patient refused    Sexually Abused: Patient refused    Family History: Family History  Problem Relation Age of Onset   Congestive Heart Failure Mother    Diabetes Mother    Hypertension Mother    Diabetes Maternal Grandmother    Congestive Heart Failure Maternal Grandfather    Breast cancer Neg Hx     Review of Systems: ROS Denies recent changes interim health.  Physical Exam: Vital Signs LMP 11/15/2014   Physical Exam  Constitutional:      General: Not in acute distress.    Appearance: Normal appearance. Not ill-appearing.  HENT:     Head: Normocephalic and atraumatic.  Eyes:     Pupils: Pupils are equal, round. Cardiovascular:     Rate and Rhythm: Normal rate.    Pulses: Normal pulses.  Pulmonary:     Effort: No respiratory distress or increased work of breathing.  Speaks in full sentences. Abdominal:     General: Abdomen is flat. No distension.   Musculoskeletal: Normal range of motion. No lower extremity swelling or edema. No varicosities. Skin:    General: Skin is warm and dry.     Findings: No erythema or rash.  Neurological:     Mental Status: Alert and oriented to person, place, and time.  Psychiatric:        Mood and Affect: Mood normal.        Behavior: Behavior normal.    Assessment/Plan: The patient is scheduled for breast reconstruction revision via fat grafting with Dr.  Lovena Le .  Risks,  benefits, and alternatives of procedure discussed, questions answered and consent obtained.    Smoking Status: Non-smoker. Last Mammogram: S/p double mastectomy with reconstruction.  Caprini Score: 6; Risk Factors include: Age, BMI greater than 25, breast cancer, and length of planned surgery. Recommendation for mechanical prophylaxis. Encourage early ambulation.    Pictures obtained: 02/13/2022  Post-op Rx sent to pharmacy: Oxycodone. She states that she already has her untouched Zofran from last surgery. Will confirm and let us know if she is out.  Patient was provided with the General Surgical Risk consent document and Pain Medication Agreement prior to their appointment.  They had adequate time to read through the risk consent documents and Pain Medication Agreement. We also discussed them in person together during this preop appointment. All of their questions were answered to their satisfaction.  Recommended calling if they have any further questions.  Risk consent form and Pain Medication Agreement to be scanned into patient's chart.  The risks that can be encountered with and after liposuction were discussed and include the following but no limited to these:  Asymmetry, fluid accumulation, firmness of the area, fat necrosis with death of fat tissue, bleeding, infection, delayed healing, anesthesia risks, skin sensation changes, injury to structures including nerves, blood vessels, and muscles which may be temporary or permanent, allergies to tape, suture materials and glues, blood products, topical preparations or injected agents, skin and contour irregularities, skin discoloration and swelling, deep vein thrombosis, cardiac and pulmonary complications, pain, which may persist, persistent pain, recurrence of the lesion, poor healing of the incision, possible need for revisional surgery or staged procedures. Thiere can also be persistent swelling, poor wound healing, rippling or loose  skin,  worsening of cellulite, swelling, and thermal burn or heat injury from ultrasound with the ultrasound-assisted lipoplasty technique. Any change in weight fluctuations can alter the outcome.   Electronically signed by: Krista Blue, PA-C 04/17/2022 10:11 AM

## 2022-04-17 NOTE — Progress Notes (Unsigned)
[facility] NOTE  Patient Care Team: Bo Merino, FNP as PCP - General (Nurse Practitioner) Clent Jacks, RN as Oncology Nurse [place], Hauser, RN (Inactive) as Oncology Nurse Navigator Pabon, Marjory Lies, MD as Consulting Physician (General Surgery) Cammie Sickle, MD as Consulting Physician (Oncology) Cindra Presume, MD (Inactive) as Consulting Physician (Plastic Surgery)  CHIEF COMPLAINTS/PURPOSE OF CONSULTATION: Breast cancer     Oncology History Overview Note  # On physical exam, I palpate a focal firm 2 x 5 cm masslike area over the 11 to 1:30 position of the left breast approximately 2-4 cm from the nipple.   Targeted ultrasound is performed, showing no discrete focal abnormality over the upper mid to outer left breast. There is a pattern of dense fibroglandular tissue over this area which appears slightly different from the dense fibroglandular tissue elsewhere in the left breast. There is hazy decreased echogenicity with subtle shadowing within this dense tissue at the 12 to 1 o'clock position. Some of this vague hazy decreased echogenicity with shadowing is present at the 12 o'clock position 4 cm from the nipple more focal with harmonics and measures approximately 1.4 x 1.6 x 2 cm. There is another more focal area of decreased echogenicity and shadowing at the 1 o'clock position of the left breast 2 cm from the nipple measuring 1.4 x 1.5 x 1.5 cm. These 2 areas appear to be contiguous and are likely part of the same process.  1.4 x 1.6 x 2 cm. There is another more focal area of decreased echogenicity and shadowing at the 1 o'clock position of the left breast 2 cm from the nipple measuring 1.4 x 1.5 x 1.5 cm. These 2 areas appear to be contiguous and are likely part of the same process.  DIAGNOSIS:  A. 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   Size of invasive  carcinoma: 2 mm in this sample  Histologic grade of invasive carcinoma: Grade 2                       Glandular/tubular differentiation score: 3                       Nuclear pleomorphism score: 2                       Mitotic rate score: 1                       Total score: 6  Ductal carcinoma in situ: Present, intermediate grade  Lymphovascular invasion: Not identified    Ultrasound the left axilla is normal. CASE SUMMARY: BREAST BIOMARKER TESTS  Estrogen Receptor (ER) Status: POSITIVE          Percentage of cells with nuclear positivity: 90-100%          Average intensity of staining: Strong   Progesterone Receptor (PgR) Status: POSITIVE          Percentage of cells with nuclear positivity: 90-100%          Average intensity of staining: Strong   HER2 (by immunohistochemistry): NEGATIVE (Score 1+)  Ki-67: Not performed  Procedure: Simple mastectomy  Specimen Laterality: Left (bilateral mastectomy, tumor in left)   TUMOR  Histologic Type: Invasive mammary carcinoma, no special type  Histologic Grade (Nottingham Histologic Score)  Glandular (Acinar)/Tubular Differentiation: 2                       Nuclear Pleomorphism: 2                       Mitotic Rate: 2                       Overall Grade: 2  Tumor Size: 10 mm  Ductal Carcinoma In Situ (DCIS): Present, with extensive intraductal  component  Lymphovascular Invasion: Not identified  Treatment Effect in the Breast: No known presurgical treatment   MARGINS  Margin Status for Invasive Carcinoma: All margins negative for invasive  carcinoma                       Distance from invasive carcinoma to closest margin:  1.2 cm, posterior   Margin Status for DCIS: Margin(s) Involved by DCIS: All margins negative  for DCIS                       Distance from DCIS to closest margin: 1.2 cm,  posterior   REGIONAL LYMPH NODES  Regional Lymph Node Status: All regional lymph nodes negative for tumor                        Total Number of Lymph Nodes Examined (sentinel and  non-sentinel): 3                       Number of Sentinel Nodes Examined: 3   DISTANT METASTASIS  Distant Site(s) Involved, if applicable: Not applicable   PATHOLOGIC STAGE CLASSIFICATION (pTNM, AJCC 8th Edition):  TNM Descriptors: M (multifocal)  pT Category: pT1b  Regional Lymph Nodes Modifier: sn  pN Category: pN0  pM Category: Not applicable   SPECIAL STUDIES  Breast Biomarker Testing Performed on Previous Biopsy: [id number]   Estrogen Receptor (ER) Status: POSITIVE  Progesterone Receptor (PgR) Status: POSITIVE  HER2 (by immunohistochemistry): NEGATIVE (Score 1+)   #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  #June 19, 2021-tamoxifen 20 mg a day [ estrogen/LH-?  Postmenopausal].   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 VUS in RAD51D called c.434G> and in [facility] called c.476C>T also identified. The final report date is 05/09/2020.  The CancerNext-Expanded + RNAinsight gene panel offered by [employer] and includes sequencing and rearrangement analysis for the following 77 genes: IP, ALK, APC*, ATM*, AXIN2, BAP1, BARD1, BLM, BMPR1A, BRCA1*, BRCA2*, BRIP1*, CDC73, CDH1*,CDK4, CDKN1B, CDKN2A, CHEK2*, CTNNA1, DICER1, FANCC, FH, FLCN, GALNT12, KIF1B, LZTR1, MAX, MEN1, MET, MLH1*, MSH2*, MSH3, MSH6*, MUTYH*, NBN, NF1*, NF2, NTHL1, PALB2*, PHOX2B, PMS2*, POT1, PRKAR1A, PTCH1, PTEN*, RAD51C*, RAD51D*,RB1, RECQL, RET, SDHA, SDHAF2, SDHB,  SDHC, SDHD, SMAD4, SMARCA4, SMARCB1, SMARCE1, STK11, SUFU, TMEM127, TP53*,TSC1, TSC2, VHL and XRCC2 (sequencing and deletion/duplication); EGFR, EGLN1, HOXB13, KIT, MITF, PDGFRA, POLD1 and POLE (sequencing only); EPCAM and GREM1 (deletion/duplication only).    05/29/2021 Cancer Staging   Staging form:  Breast, AJCC 8th Edition - Pathologic: pT1b, pN0, cM0, ER+, PR+, HER2- - Signed by Cammie Sickle, MD on 05/29/2021 Nuclear grade: G2      HISTORY OF PRESENTING ILLNESS: Alone.  Ambulating independently.  Tracey Huff 51 y.o.  female patient with stage I ER/PR positive HER2 negative left breast cancer  [CHEK2 mutant] s/p bilateral mastectomies on tamoxifen is here for follow-up.  Patient is clinically doing well.  Complains of hot flashes mild to moderate intense.  Review of Systems  Constitutional:  Negative for chills, diaphoresis, fever, malaise/fatigue and weight loss.  HENT:  Negative for nosebleeds and sore throat.   Eyes:  Negative for double vision.  Respiratory:  Negative for cough, hemoptysis, sputum production, shortness of breath and wheezing.   Cardiovascular:  Negative for chest pain, palpitations, orthopnea and leg swelling.  Gastrointestinal:  Negative for abdominal pain, blood in stool, constipation, diarrhea, heartburn, melena, nausea and vomiting.  Genitourinary:  Negative for dysuria, frequency and urgency.  Musculoskeletal:  Negative for back pain and joint pain.  Skin: Negative.  Negative for itching and rash.  Neurological:  Negative for dizziness, tingling, focal weakness, weakness and headaches.  Endo/Heme/Allergies:  Does not bruise/bleed easily.  Psychiatric/Behavioral:  Negative for depression. The patient is not nervous/anxious and does not have insomnia.      MEDICAL HISTORY:  Past Medical History:  Diagnosis Date  . Allergy   . Anxiety   . Asthma   . Breast cancer ([place])    INVASIVE MAMMARY CARCINOMA- left  . Family history of prostate cancer    . Fibroid uterus 11/15/2014  . Hyperlipidemia   . Hypertension   . Menorrhagia with irregular cycle 11/15/2014  . Pneumonia   . Pre-diabetes   . Shortness of breath dyspnea     SURGICAL HISTORY: Past Surgical History:  Procedure Laterality Date  . ABDOMINAL HYSTERECTOMY N/A 11/15/2014   Procedure: HYSTERECTOMY ABDOMINAL/BILATERAL SALPINGECTOMY ;  Surgeon: Will Bonnet, MD;  Location: [facility] ORS;  Service: Gynecology;  Laterality: N/A;  . BREAST BIOPSY Right    2019 negative  . BREAST BIOPSY Left 03/09/2021   u/s biopsy, 12-1 o'clock, VENUS" clip-path pending  . BREAST LUMPECTOMY,RADIO FREQ LOCALIZER,AXILLARY SENTINEL LYMPH NODE BIOPSY Left 03/28/2021   Procedure: BREAST LUMPECTOMY,RADIO FREQ LOCALIZER,AXILLARY SENTINEL LYMPH NODE BIOPSY;  Surgeon: Jules Husbands, MD;  Location: [facility] ORS;  Service: General;  Laterality: Left;  . BREAST RECONSTRUCTION WITH PLACEMENT OF TISSUE EXPANDER AND FLEX HD (ACELLULAR HYDRATED DERMIS) Bilateral 05/15/2021   Procedure: BILATERAL BREAST RECONSTRUCTION WITH PLACEMENT OF TISSUE EXPANDER AND FLEX HD (ACELLULAR HYDRATED DERMIS);  Surgeon: Cindra Presume, MD;  Location: [facility] ORS;  Service: Plastics;  Laterality: Bilateral;  . CESAREAN SECTION  1990  . COLONOSCOPY WITH PROPOFOL N/A 01/04/2022   Procedure: COLONOSCOPY WITH PROPOFOL;  Surgeon: [name], MD;  Location: [facility];  Service: Gastroenterology;  Laterality: N/A;  . COLPOSCOPY  07/28/2018  . CYSTOSCOPY N/A 11/15/2014   Procedure: CYSTOSCOPY;  Surgeon: Will Bonnet, MD;  Location: [facility] ORS;  Service: Gynecology;  Laterality: N/A;  . DILATION AND CURETTAGE OF UTERUS    . GANGLION CYST EXCISION Left   . REMOVAL OF BILATERAL TISSUE EXPANDERS WITH PLACEMENT OF BILATERAL BREAST IMPLANTS Bilateral 10/02/2021   Procedure: REMOVAL OF BILATERAL TISSUE EXPANDERS WITH PLACEMENT OF BILATERAL BREAST IMPLANTS;  Surgeon: Cindra Presume, MD;  Location: [place];  Service:  Plastics;  Laterality: Bilateral;  . TOTAL MASTECTOMY Bilateral 05/15/2021   Procedure: TOTAL MASTECTOMY, Bilateral  Simple Mastectomy, skin sparing;  Surgeon: Jules Husbands, MD;  Location: [facility] ORS;  Service: General;  Laterality: Bilateral;  Provider requesting for 2 hours / 120 min for procedure.  Marland Kitchen UTERINE FIBROID SURGERY      SOCIAL HISTORY: Social History   Socioeconomic History  . Marital status: Married    Spouse name: Fritz Pickerel  . Number of children: 2  . Years of education: Not on file  . Highest education level: Not on file  Occupational History  . Not on file  Tobacco Use  . Smoking status: Never    Passive exposure: Never  . Smokeless tobacco: Never  Vaping Use  . Vaping Use: Never used  Substance and Sexual Activity  . Alcohol use: Not Currently    Comment: occassional  . Drug use: No  . Sexual activity: Yes    Partners: Male    Birth control/protection: Surgical  Other Topics Concern  . Not on file  Social History Narrative   Works for [employer]; never smoked; no alcohol; 2 children [25 and 31y- at 2022]. Lives with husband.    Social Determinants of Health   Financial Resource Strain: Low Risk  (07/09/2021)   Overall Financial Resource Strain (CARDIA)   . Difficulty of Paying Living Expenses: Not very hard  Food Insecurity: No Food Insecurity (07/09/2021)   Hunger Vital Sign   . Worried About Charity fundraiser in the Last Year: Never true   . Ran Out of Food in the Last Year: Never true  Transportation Needs: No Transportation Needs (07/09/2021)   PRAPARE - Transportation   . Lack of Transportation (Medical): No   . Lack of Transportation (Non-Medical): No  Physical Activity: Inactive (07/09/2021)   Exercise Vital Sign   . Days of Exercise per Week: 0 days   . Minutes of Exercise per Session: 0 min  Stress: Stress Concern Present (07/09/2021)   [place]   . Feeling of Stress : To some  extent  Social Connections: Socially Integrated (07/09/2021)   Social Connection and Isolation Panel [NHANES]   . Frequency of Communication with Friends and Family: Three times a week   . Frequency of Social Gatherings with Friends and Family: Three times a week   . Attends Religious Services: More than 4 times per year   . Active Member of Clubs or Organizations: Yes   . Attends Archivist Meetings: 1 to 4 times per year   . Marital Status: Married  Human resources officer Violence: Unknown (07/09/2021)   Humiliation, Afraid, Rape, and Kick questionnaire   . Fear of Current or Ex-Partner: Patient refused   . Emotionally Abused: Patient refused   . Physically Abused: Patient refused   . Sexually Abused: Patient refused    FAMILY HISTORY: Family History  Problem Relation Age of Onset  . Congestive Heart Failure Mother   . Diabetes Mother   . Hypertension Mother   . Diabetes Maternal Grandmother   . Congestive Heart Failure Maternal Grandfather   . Breast cancer Neg Hx     ALLERGIES:  is allergic to macadamia nut oil, apple juice, [place] nut (berthollefia excelsa) skin test, carrot oil, fruit & vegetable daily [nutritional supplements], kiwi extract, and peach [prunus persica].  MEDICATIONS:  Current Outpatient Medications  Medication Sig Dispense Refill  . albuterol (PROVENTIL HFA) 108 (90 Base) MCG/ACT inhaler Inhale 1 to 2 puffs every 4-6 hours as needed for cough/wheeze 18 g 0  .  albuterol (PROVENTIL HFA) 108 (90 Base) MCG/ACT inhaler Inhale 1-2 puffs into the lungs every 4 (four) to 6 (six) hours as needed. 6.7 g 0  . Ascorbic Acid (VITAMIN C) 100 MG tablet Take 100 mg by mouth daily.    Marland Kitchen atorvastatin (LIPITOR) 20 MG tablet Take 1 tablet (20 mg total) by mouth at bedtime. (Patient taking differently: Take 10 mg by mouth every other day. Bedtime) 90 tablet 3  . AUVI-Q 0.3 MG/0.3ML SOAJ injection Inject 0.3 mg into the muscle as needed for anaphylaxis.    Marland Kitchen azelastine  (ASTELIN) 0.1 % nasal spray Place 1-2 sprays into both nostrils 2 (two) times daily. 30 mL 6  . busPIRone (BUSPAR) 7.5 MG tablet Take 1 tablet once daily at night; may take 1 additional tablet daily if needed for anxiety. (Patient taking differently: Take 7.5 mg by mouth as needed. Take 1 tablet once daily at night; may take 1 additional tablet daily if needed for anxiety.) 180 tablet 1  . Cholecalciferol 25 MCG (1000 UT) capsule Take 1,000 Units by mouth daily.    . diphenhydrAMINE (BENADRYL) 2 % cream Apply 1 application topically 2 (two) times daily as needed for itching.    Marland Kitchen ELDERBERRY PO Take 1 capsule by mouth daily.    Marland Kitchen ezetimibe (ZETIA) 10 MG tablet Take 1 tablet (10 mg total) by mouth daily. 90 tablet 3  . fluticasone (FLONASE) 50 MCG/ACT nasal spray Place 1-2 sprays into both nostrils daily. 16 g 6  . Fluticasone-Umeclidin-Vilant (TRELEGY ELLIPTA) 200-62.5-25 MCG/ACT AEPB Inhale 1 puff into the lungs daily. (Patient taking differently: Inhale 1 puff into the lungs every morning.) 60 each 6  . Fluticasone-Umeclidin-Vilant (TRELEGY ELLIPTA) 200-62.5-25 MCG/ACT AEPB Inhale 1 puff into the lungs daily. 60 each 6  . hydrocortisone cream 1 % Apply 1 application topically daily as needed for itching.    . hydrOXYzine (VISTARIL) 25 MG capsule TAKE 1 CAPSULE (25 MG TOTAL) BY MOUTH AT BEDTIME AS NEEDED FOR UP TO 30 DOSES (DIFFICULTY SLEEPING). 30 capsule 1  . ibuprofen (ADVIL) 200 MG tablet Take 400 mg by mouth every 6 (six) hours as needed for moderate pain.    Marland Kitchen ipratropium-albuterol (DUONEB) 0.5-2.5 (3) MG/3ML SOLN Take 3 mLs by nebulization every 6 (six) hours as needed (wheeze, SOB, cough variant asthma). 360 mL 1  . levocetirizine (XYZAL) 5 MG tablet Take 1 tablet (5 mg total) by mouth every evening. 30 tablet 6  . losartan (COZAAR) 25 MG tablet Take 1 tablet (25 mg total) by mouth every morning. 90 tablet 0  . montelukast (SINGULAIR) 10 MG tablet Take 1 tablet (10 mg total) by mouth every  evening. 30 tablet 6  . Olopatadine HCl (PATADAY) 0.2 % SOLN Place 1 drop into affected eye daily. (Patient taking differently: Place 1 drop into both eyes daily as needed (itching).) 2.5 mL 6  . ondansetron (ZOFRAN-ODT) 4 MG disintegrating tablet Take 1 tablet (4 mg total) by mouth every 8 (eight) hours as needed for nausea or vomiting. 20 tablet 0  . tamoxifen (NOLVADEX) 20 MG tablet Take 1 tablet (20 mg total) by mouth daily. 90 tablet 1   No current facility-administered medications for this visit.      Marland Kitchen  PHYSICAL EXAMINATION: ECOG PERFORMANCE STATUS: 0 - Asymptomatic  There were no vitals filed for this visit.  There were no vitals filed for this visit.  Physical Exam Vitals and nursing note reviewed.  HENT:     Head: Normocephalic and atraumatic.  Mouth/Throat:     Pharynx: Oropharynx is clear.  Eyes:     Extraocular Movements: Extraocular movements intact.     Pupils: Pupils are equal, round, and reactive to light.  Cardiovascular:     Rate and Rhythm: Normal rate and regular rhythm.  Pulmonary:     Comments: Decreased breath sounds bilaterally.  Abdominal:     Palpations: Abdomen is soft.  Musculoskeletal:        General: Normal range of motion.     Cervical back: Normal range of motion.  Skin:    General: Skin is warm.  Neurological:     General: No focal deficit present.     Mental Status: She is alert and oriented to person, place, and time.  Psychiatric:        Behavior: Behavior normal.        Judgment: Judgment normal.     LABORATORY DATA:  I have reviewed the data as listed Lab Results  Component Value Date   WBC 7.4 11/07/2021   HGB 12.3 11/07/2021   HCT 37.6 11/07/2021   MCV 83.4 11/07/2021   PLT 379 11/07/2021   Recent Labs    05/09/21 1009 05/15/21 1907 05/16/21 0633 11/07/21 1033  NA 138  --  136 140  K 4.3  --  3.6 4.5  CL 104  --  106 106  CO2 24  --  23 23  GLUCOSE 104*  --  121* 107*  BUN 12  --  10 14  CREATININE 0.82  0.76 0.91 0.87  CALCIUM 9.1  --  8.6* 9.6  GFRNONAA  --  >60 >60  --   PROT 7.0  --  6.8 7.5  ALBUMIN  --   --  3.3*  --   AST 14  --  19 19  ALT 13  --  14 18  ALKPHOS  --   --  40  --   BILITOT 0.4  --  0.4 0.3     RADIOGRAPHIC STUDIES: I have personally reviewed the radiological images as listed and agreed with the findings in the report. No results found.  ASSESSMENT & PLAN:   Carcinoma of upper-outer quadrant of left breast in female, estrogen receptor positive ([place]) #Left breast cancer T11m0- G-2; ER/PR positive HER2 negative status BIL postmastectomy [positive for CHEK-2 mutation].  Oncotype recurrence score 16/LOW. On Tamoxifen [s/p hysterectomy [intact ovaries]; no role for mammograms. STABLE;  #Patient tolerated tamoxifen fairly well except for hot flashes.   # Hot flashes-grade 1 through 2- STABLE;  monitor for now.  If worse will consider pharmacologic therapy.  # DISPOSITION:  # follow up 6 months- MD; no labs- Dr.B    All questions were answered. The patient/family knows to call the clinic with any problems, questions or concerns.     GCammie Sickle MD 04/17/2022 8:29 AM

## 2022-04-17 NOTE — Progress Notes (Unsigned)
Patient has questions prior to breast reconstruction surgery on 05/03/22.

## 2022-04-18 ENCOUNTER — Telehealth: Payer: Self-pay | Admitting: Internal Medicine

## 2022-04-18 NOTE — Telephone Encounter (Signed)
I spoke to patient re: her concerns for her upcoming reconstruction surgery. Pt will continue follow up as planned. GB

## 2022-04-19 DIAGNOSIS — J3089 Other allergic rhinitis: Secondary | ICD-10-CM | POA: Diagnosis not present

## 2022-04-19 DIAGNOSIS — J3081 Allergic rhinitis due to animal (cat) (dog) hair and dander: Secondary | ICD-10-CM | POA: Diagnosis not present

## 2022-04-19 DIAGNOSIS — J301 Allergic rhinitis due to pollen: Secondary | ICD-10-CM | POA: Diagnosis not present

## 2022-04-22 ENCOUNTER — Ambulatory Visit (INDEPENDENT_AMBULATORY_CARE_PROVIDER_SITE_OTHER): Payer: 59 | Admitting: Psychologist

## 2022-04-22 DIAGNOSIS — F4322 Adjustment disorder with anxiety: Secondary | ICD-10-CM

## 2022-04-22 NOTE — Progress Notes (Signed)
[facility] Counselor/Therapist Progress Note  Patient ID: MAHARI VANKIRK, MRN: [id number],    Date: 04/22/2022  Time Spent: 03:05 pm to 03:45 pm; total time: 40 minutes   This session was held via in person. The patient consented to in-person therapy and was in the clinician's office. Limits of confidentiality were discussed with the patient.   Treatment Type: Individual Therapy  Reported Symptoms: Multiple stressors  Mental Status Exam: Appearance:  Well Groomed     Behavior: Appropriate  Motor: Normal  Speech/Language:  Clear and Coherent  Affect: Appropriate  Mood: normal  Thought process: normal  Thought content:   WNL  Sensory/Perceptual disturbances:   WNL  Orientation: oriented to person, place, and time/date  Attention: Good  Concentration: Good  Memory: WNL  Fund of knowledge:  Good  Insight:   Fair  Judgment:  Good  Impulse Control: Good   Risk Assessment: Danger to Self:  No Self-injurious Behavior: No Danger to Others: No Duty to Warn:no Physical Aggression / Violence:No  Access to Firearms a concern: No  Gang Involvement:No   Subjective: Beginning the session, patient stated that she is experiencing a large amount of stress due to different events in her life. Elaborating, she expressed stress related to a situation at work, then Christmas is the anniversary of her mother's death, dealing with conflict with other family members including her husband, and an upcoming medical procedure. She processed thoughts and emotions. She asked to follow up. She denied suicidal and homicidal ideation.   Interventions:  Worked on developing a therapeutic relationship using active listening and reflective statements. Provided emotional support using empathy and validation. Used summary statements. Normalized and validated expressed thoughts and emotions. Reviewed events since the last session. Normalized and validated expressed thoughts and emotions.  Reflected on patient's mother who died. Processed thoughts and emotions. Briefly explored ways that patient could honor her mother. Validated concerns at work. Processed conflict at home with spouse. Explored if it was the tumor or medication that could be contributing to behaviors. Used socratic questions to assist the patient gain insight into self. Processed and explored ways that patient could implement self-care. Validated and normalized expressed emotions. Processed continuing to journal. Assigned homework.  Provided empathic statements. Assessed for suicidal and homicidal ideation.   Homework: Continue to journal and implement self care activities  Next Session: Review homework, emotional support. Process surgery  Diagnosis: F43.22 adjustment disorder with anxiety   Plan:   Goals Work through the grieving process and face reality of own death Accept emotional support from others around them Live life to the fullest, event though time may be limited Become as knowledgeable about the medical condition  Reduce fear, anxiety about the health condition  Accept the illness Accept the role of psychological and behavioral factors  Stabilize anxiety level wile increasing ability to function Learn and implement coping skills that result in a reduction of anxiety  Alleviate depressive symptoms Recognize, accept, and cope with depressive feelings Develop healthy thinking patterns Develop healthy interpersonal relationships  Objectives target date for all objectives is 07/10/2022 Identify feelings associated with the illness Family members share with each other feelings Identify the losses or limitations that have been experienced Verbalize acceptance of the reality of the medical condition Commit to learning and implement a proactive approach to managing personal stresses Verbalize an understanding of the medical condition Work with therapist to develop a plan for coping with stress Learn  and implement skills for managing stress Engage in social,  productive activities that are possible Engage in faith based activities implement positive imagery Identify coping skills and sources of emotional support Patient's partner and family members verbalize their fears regarding severity of health condition Identify sources of emotional distress  Learning and implement calming skills to reduce overall anxiety Learn and implement problem solving strategies Identify and engage in pleasant activities Learning and implement personal and interpersonal skills to reduce anxiety and improve interpersonal relationships Learn to accept limitations in life and commit to tolerating, rather than avoiding, unpleasant emotions while accomplishing meaningful goals Identify major life conflicts from the past and present that form the basis for present anxiety Learn and implement behavioral strategies Verbalize an understanding and resolution of current interpersonal problems Learn and implement problem solving and decision making skills Learn and implement conflict resolution skills to resolve interpersonal problems Verbalize an understanding of healthy and unhealthy emotions verbalize insight into how past relationships may be influence current experiences with depression Use mindfulness and acceptance strategies and increase value based behavior  Increase hopeful statements about the future.   Interventions Teach about stress and ways to handle stress Assist the patient in developing a coping action plan for stressors Conduct skills based training for coping strategies Train problem focused skills Sort out what activities the individual can do Encourage patient to rely upon his/her spiritual faith Teach the patient to use guided imagery Probe and evaluate family's ability to provide emotional support Allow family to share their fears Assist the patient in identifying, sorting through, and  verbalizing the various feelings generated by his/her medical condition Meet with family members  Ask patient list out limitations  Use stress inoculation training  Use Acceptance and Commitment Therapy to help client accept uncomfortable realities in order to accomplish value-consistent goals Reinforce the client's insight into the role of his/her past emotional pain and present anxiety  Discuss examples demonstrating that unrealistic worry overestimates the probability of threats and underestimate patient's ability  Assist the patient in analyzing his or her worries Help patient understand that avoidance is reinforcing  Behavioral activation help the client explore the relationship, nature of the dispute,  Help the client develop new interpersonal skills and relationships Conduct Problem so living therapy Teach conflict resolution skills Use a process-experiential approach Conduct TLDP Conduct ACT  The patient and clinician reviewed the treatment plan on 08/07/2021. The patient approved of the treatment plan.   Conception Chancy, PsyD

## 2022-04-24 ENCOUNTER — Encounter (HOSPITAL_BASED_OUTPATIENT_CLINIC_OR_DEPARTMENT_OTHER): Payer: Self-pay | Admitting: Plastic Surgery

## 2022-04-26 DIAGNOSIS — J3081 Allergic rhinitis due to animal (cat) (dog) hair and dander: Secondary | ICD-10-CM | POA: Diagnosis not present

## 2022-04-26 DIAGNOSIS — J301 Allergic rhinitis due to pollen: Secondary | ICD-10-CM | POA: Diagnosis not present

## 2022-04-26 DIAGNOSIS — J3089 Other allergic rhinitis: Secondary | ICD-10-CM | POA: Diagnosis not present

## 2022-04-30 ENCOUNTER — Telehealth (INDEPENDENT_AMBULATORY_CARE_PROVIDER_SITE_OTHER): Payer: 59 | Admitting: Family Medicine

## 2022-04-30 ENCOUNTER — Encounter: Payer: Self-pay | Admitting: Family Medicine

## 2022-04-30 VITALS — Ht 60.0 in | Wt 183.0 lb

## 2022-04-30 DIAGNOSIS — Z20822 Contact with and (suspected) exposure to covid-19: Secondary | ICD-10-CM

## 2022-04-30 DIAGNOSIS — J4540 Moderate persistent asthma, uncomplicated: Secondary | ICD-10-CM | POA: Diagnosis not present

## 2022-04-30 DIAGNOSIS — J069 Acute upper respiratory infection, unspecified: Secondary | ICD-10-CM | POA: Diagnosis not present

## 2022-04-30 MED ORDER — PREDNISONE 20 MG PO TABS: 40.0000 mg | ORAL_TABLET | Freq: Every day | ORAL | 0 refills | Status: AC

## 2022-04-30 NOTE — Progress Notes (Signed)
Name: Tracey Huff   MRN: [id number]    DOB: 01/14/1971   Date:04/30/2022       Progress Note  Subjective:    Chief Complaint  Chief Complaint  Patient presents with   Headache   Nasal Congestion    Started Saturday, Took Mucinex- only a little bit of help.   Cough    Worse in morning    I connected with  [name] on 04/30/22 at  3:40 PM EST by telephone and verified that I am speaking with the correct person using two identifiers.   I discussed the limitations, risks, security and privacy concerns of performing an evaluation and management service by telephone and the availability of in person appointments. Staff also discussed with the patient that there may be a patient responsible charge related to this service.  Patient verbalized understanding and agreed to proceed with encounter. Patient Location: pt car Provider Location: [facility] Additional Individuals present:   HPI Exposed to covid - husband over the last couple days he has not isolated very well at home and patient believes she has been exposed to him Sx onset 2-3 d ago, she has been working from home due to her symptoms and had to reschedule a surgery that was planned this week She has congestion and HA, some mild cough but she is not having worse asthma symptoms just yet she is still using her inhalers regularly     Patient Active Problem List   Diagnosis Date Noted   Colon cancer screening    Persistent adjustment disorder with anxiety 12/20/2021   Breast cancer ([place]) 05/15/2021   Genetic testing 05/10/2021   Family history of prostate cancer 04/17/2021   Allergic rhinitis 03/21/2021   Allergic rhinitis due to animal (cat) (dog) hair and dander 03/21/2021   Allergic rhinitis due to pollen 03/21/2021   Chronic allergic conjunctivitis 03/21/2021   Mild persistent asthma, uncomplicated 44/81/8563   Carcinoma of upper-outer quadrant of left breast in female, estrogen receptor positive ([place])  03/21/2021   High grade squamous intraepithelial cervical dysplasia 01/29/2019   CIN III (cervical intraepithelial neoplasia grade III) with severe dysplasia 08/11/2018   Atypical squamous cell changes of undetermined significance (ASCUS) on cervical cytology with positive high risk human papilloma virus (HPV) 06/29/2018   Prediabetes 06/24/2018   Class 1 obesity due to excess calories with serious comorbidity and body mass index (BMI) of 33.0 to 33.9 in adult 05/20/2018   Essential hypertension 05/20/2018   Hyperlipidemia 05/20/2018   Asthma 05/20/2018   Anxiety in acute stress reaction 05/20/2018   Status post hysterectomy 11/15/2014    Social History   Tobacco Use   Smoking status: Never    Passive exposure: Never   Smokeless tobacco: Never  Substance Use Topics   Alcohol use: Not Currently    Comment: occassional     Current Outpatient Medications:    albuterol (PROVENTIL HFA) 108 (90 Base) MCG/ACT inhaler, Inhale 1-2 puffs into the lungs every 4 (four) to 6 (six) hours as needed., Disp: 6.7 g, Rfl: 0   Ascorbic Acid (VITAMIN C) 100 MG tablet, Take 100 mg by mouth daily., Disp: , Rfl:    atorvastatin (LIPITOR) 20 MG tablet, Take 1 tablet (20 mg total) by mouth at bedtime. (Patient taking differently: Take 10 mg by mouth every other day. Bedtime), Disp: 90 tablet, Rfl: 3   AUVI-Q 0.3 MG/0.3ML SOAJ injection, Inject 0.3 mg into the muscle as needed for anaphylaxis., Disp: , Rfl:  azelastine (ASTELIN) 0.1 % nasal spray, Place 1-2 sprays into both nostrils 2 (two) times daily., Disp: 30 mL, Rfl: 6   busPIRone (BUSPAR) 7.5 MG tablet, Take 1 tablet once daily at night; may take 1 additional tablet daily if needed for anxiety., Disp: 180 tablet, Rfl: 1   Cholecalciferol 25 MCG (1000 UT) capsule, Take 1,000 Units by mouth daily., Disp: , Rfl:    diphenhydrAMINE (BENADRYL) 2 % cream, Apply 1 application topically 2 (two) times daily as needed for itching., Disp: , Rfl:    ELDERBERRY  PO, Take 1 capsule by mouth daily., Disp: , Rfl:    ezetimibe (ZETIA) 10 MG tablet, Take 1 tablet (10 mg total) by mouth daily., Disp: 90 tablet, Rfl: 3   fluticasone (FLONASE) 50 MCG/ACT nasal spray, Place 1-2 sprays into both nostrils daily., Disp: 16 g, Rfl: 6   Fluticasone-Umeclidin-Vilant (TRELEGY ELLIPTA) 200-62.5-25 MCG/ACT AEPB, Inhale 1 puff into the lungs daily., Disp: 60 each, Rfl: 6   hydrocortisone cream 1 %, Apply 1 application topically daily as needed for itching., Disp: , Rfl:    hydrOXYzine (VISTARIL) 25 MG capsule, TAKE 1 CAPSULE (25 MG TOTAL) BY MOUTH AT BEDTIME AS NEEDED FOR UP TO 30 DOSES (DIFFICULTY SLEEPING)., Disp: 30 capsule, Rfl: 1   ipratropium-albuterol (DUONEB) 0.5-2.5 (3) MG/3ML SOLN, Take 3 mLs by nebulization every 6 (six) hours as needed (wheeze, SOB, cough variant asthma)., Disp: 360 mL, Rfl: 1   levocetirizine (XYZAL) 5 MG tablet, Take 1 tablet (5 mg total) by mouth every evening., Disp: 30 tablet, Rfl: 6   losartan (COZAAR) 25 MG tablet, Take 1 tablet (25 mg total) by mouth every morning., Disp: 90 tablet, Rfl: 0   montelukast (SINGULAIR) 10 MG tablet, Take 1 tablet (10 mg total) by mouth every evening., Disp: 30 tablet, Rfl: 6   Olopatadine HCl (PATADAY) 0.2 % SOLN, Place 1 drop into affected eye daily. (Patient taking differently: Place 1 drop into both eyes daily as needed (itching).), Disp: 2.5 mL, Rfl: 6   ondansetron (ZOFRAN-ODT) 4 MG disintegrating tablet, Take 1 tablet (4 mg total) by mouth every 8 (eight) hours as needed for nausea or vomiting., Disp: 20 tablet, Rfl: 0   oxyCODONE (ROXICODONE) 5 MG immediate release tablet, Take 1 tablet (5 mg total) by mouth every 6 (six) hours as needed for up to 5 days for severe pain., Disp: 20 tablet, Rfl: 0   tamoxifen (NOLVADEX) 20 MG tablet, Take 1 tablet (20 mg total) by mouth daily., Disp: 90 tablet, Rfl: 1  Allergies  Allergen Reactions   Macadamia Nut Oil Shortness Of Breath   Apple Juice Nausea And Vomiting    [place] Nut (Berthol[place]fia [place]) Skin Test Nausea And Vomiting and Swelling    Tongue swelling   Carrot Oil Nausea And Vomiting    Can not cooked raw carrots   Fruit & Vegetable Daily [Nutritional Supplements] Nausea And Vomiting    Cannot tolerate apples, peaches, plums, and nectarines   Kiwi Extract Nausea And Vomiting   Peach [Prunus Persica] Nausea And Vomiting    Chart Review: I personally reviewed active problem list, medication list, allergies, family history, social history, health maintenance, notes from last encounter, lab results, imaging with the patient/caregiver today.   Review of Systems  Constitutional: Negative.   HENT: Negative.    Eyes: Negative.   Respiratory: Negative.    Cardiovascular: Negative.   Gastrointestinal: Negative.   Endocrine: Negative.   Genitourinary: Negative.   Musculoskeletal: Negative.   Skin: Negative.  Allergic/Immunologic: Negative.   Neurological: Negative.   Hematological: Negative.   Psychiatric/Behavioral: Negative.    All other systems reviewed and are negative.    Objective:    Virtual encounter, vitals limited, only able to obtain the following Today's Vitals   04/30/22 1532  Weight: 183 lb (83 kg)  Height: 5' (1.524 m)   Body mass index is 35.74 kg/m. Nursing Note and Vital Signs reviewed.  Physical Exam Vitals and nursing note reviewed.  Constitutional:      General: She is not in acute distress.    Appearance: She is well-developed. She is obese. She is not ill-appearing, toxic-appearing or diaphoretic.  Pulmonary:     Effort: Pulmonary effort is normal. No respiratory distress.     Comments: No tachypnea, retractions, accessory muscle use no audible wheeze or stridor Neurological:     Mental Status: She is alert.     PE limited by telephone encounter  No results found for this or any previous visit (from the past 72 hour(s)).  Assessment and Plan:     ICD-10-CM   1. Close exposure to COVID-19  virus  Z20.822 Novel Coronavirus, NAA ([facility])   1 home test negative, encouraged repeat home testing she can do PCR, discussed Paxlovid if positive    2. Upper respiratory tract infection, unspecified type  J06.9 Novel Coronavirus, NAA ([facility])   Supportive measures with antihistamines, decongestants, cough medications, also can use Tylenol ibuprofen for fever or aches and pains    3. Moderate persistent asthma without complication  W09.81 predniSONE (DELTASONE) 20 MG tablet    Novel Coronavirus, NAA ([facility])   She does not feel she is having a exacerbation currently, but history of multiple exacerbation and with URI- start steroids with worse asthma sx      -Red flags and when to present for emergency care or RTC including but not limited to new/worsening/un-resolving symptoms,  reviewed with patient at time of visit. Follow up and care instructions discussed and provided in AVS. - I discussed the assessment and treatment plan with the patient. The patient was provided an opportunity to ask questions and all were answered. The patient agreed with the plan and demonstrated an understanding of the instructions.  - The patient was advised to call back or seek an in-person evaluation if the symptoms worsen or if the condition fails to improve as anticipated.  I provided 17 minutes of non-face-to-face time during this encounter.  Delsa Grana, PA-C 04/30/22 3:54 PM

## 2022-05-01 ENCOUNTER — Telehealth: Payer: Self-pay | Admitting: *Deleted

## 2022-05-01 NOTE — Telephone Encounter (Signed)
Spoke to patient, confirmed URI. Need to cancel sx for 12/29. She is to call or Mychart me her new [place] ins once she has it so we can check for auth needs for 2024.

## 2022-05-03 ENCOUNTER — Ambulatory Visit (HOSPITAL_BASED_OUTPATIENT_CLINIC_OR_DEPARTMENT_OTHER): Admission: RE | Admit: 2022-05-03 | Payer: 59 | Source: Ambulatory Visit | Admitting: Plastic Surgery

## 2022-05-03 DIAGNOSIS — Z01818 Encounter for other preprocedural examination: Secondary | ICD-10-CM

## 2022-05-03 SURGERY — REVISION, RECONSTRUCTION, BREAST
Anesthesia: General | Site: Breast | Laterality: Bilateral

## 2022-05-04 LAB — NOVEL CORONAVIRUS, NAA: SARS-CoV-2, NAA: DETECTED — AB

## 2022-05-10 ENCOUNTER — Ambulatory Visit (INDEPENDENT_AMBULATORY_CARE_PROVIDER_SITE_OTHER): Payer: Commercial Managed Care - PPO | Admitting: Nurse Practitioner

## 2022-05-10 ENCOUNTER — Encounter: Payer: Self-pay | Admitting: Nurse Practitioner

## 2022-05-10 ENCOUNTER — Other Ambulatory Visit: Payer: Self-pay

## 2022-05-10 VITALS — BP 130/82 | HR 96 | Temp 98.5°F | Resp 16 | Ht 60.0 in | Wt 181.0 lb

## 2022-05-10 DIAGNOSIS — E6609 Other obesity due to excess calories: Secondary | ICD-10-CM | POA: Diagnosis not present

## 2022-05-10 DIAGNOSIS — J4540 Moderate persistent asthma, uncomplicated: Secondary | ICD-10-CM | POA: Diagnosis not present

## 2022-05-10 DIAGNOSIS — C50412 Malignant neoplasm of upper-outer quadrant of left female breast: Secondary | ICD-10-CM | POA: Diagnosis not present

## 2022-05-10 DIAGNOSIS — R7303 Prediabetes: Secondary | ICD-10-CM

## 2022-05-10 DIAGNOSIS — E785 Hyperlipidemia, unspecified: Secondary | ICD-10-CM

## 2022-05-10 DIAGNOSIS — Z79899 Other long term (current) drug therapy: Secondary | ICD-10-CM

## 2022-05-10 DIAGNOSIS — I10 Essential (primary) hypertension: Secondary | ICD-10-CM

## 2022-05-10 DIAGNOSIS — J309 Allergic rhinitis, unspecified: Secondary | ICD-10-CM

## 2022-05-10 DIAGNOSIS — G4709 Other insomnia: Secondary | ICD-10-CM

## 2022-05-10 DIAGNOSIS — Z6833 Body mass index (BMI) 33.0-33.9, adult: Secondary | ICD-10-CM

## 2022-05-10 DIAGNOSIS — Z170 Estrogen receptor positive status [ER+]: Secondary | ICD-10-CM

## 2022-05-10 MED ORDER — LOSARTAN POTASSIUM 25 MG PO TABS: 25.0000 mg | ORAL_TABLET | ORAL | 1 refills | Status: DC

## 2022-05-10 MED ORDER — ATORVASTATIN CALCIUM 10 MG PO TABS: 10.0000 mg | ORAL_TABLET | Freq: Every day | ORAL | 1 refills | Status: DC

## 2022-05-10 MED ORDER — ZOLPIDEM TARTRATE 5 MG PO TABS: 5.0000 mg | ORAL_TABLET | Freq: Every evening | ORAL | 0 refills | Status: DC | PRN

## 2022-05-10 NOTE — Assessment & Plan Note (Signed)
Continue using nebulizer as needed. Continue using trelegy, albuterol and singulair.

## 2022-05-10 NOTE — Assessment & Plan Note (Signed)
Getting labs, discussed decreasing sugar in her diet

## 2022-05-10 NOTE — Assessment & Plan Note (Signed)
Continue using pataday, flonase, astelin and singulair

## 2022-05-10 NOTE — Assessment & Plan Note (Signed)
Continue taking losartan 25 mg daily

## 2022-05-10 NOTE — Assessment & Plan Note (Signed)
Continue working on lifestyle modification 

## 2022-05-10 NOTE — Assessment & Plan Note (Signed)
Continue taking atorvastatin 10 mg daily

## 2022-05-10 NOTE — Progress Notes (Signed)
BP 130/82   Pulse 96   Temp 98.5 F (36.9 C) (Oral)   Resp 16   Ht 5' (1.524 m)   Wt 181 lb (82.1 kg)   LMP 11/15/2014   SpO2 98%   BMI 35.35 kg/m    Subjective:    Patient ID: Tracey Huff, female    DOB: [birth year], 52 y.o.   MRN: [id number]  HPI: Tracey Huff is a 52 y.o. female  Chief Complaint  Patient presents with   Hypertension   Hyperlipidemia   Prediabetes    6 month follow up   Anxiety: patient is being seen at [facility].  She is seen once every two weeks. She reports her anxiety is better with therapy.     05/10/2022    9:55 AM 11/07/2021    9:59 AM 05/09/2021    9:49 AM 05/20/2018   12:51 PM  GAD 7 : Generalized Anxiety Score  Nervous, Anxious, on Edge 0 2 0 2  Control/stop worrying 0 0 0 2  Worry too much - different things 0 1 0 2  Trouble relaxing 0 1 0 2  Restless 0 1 0 1  Easily annoyed or irritable 0 1 0 1  Afraid - awful might happen 0 1 0 3  Total GAD 7 Score 0 7 0 13  Anxiety Difficulty Not difficult at all Not difficult at all Not difficult at all Very difficult        05/10/2022    9:54 AM 04/30/2022    3:27 PM 02/13/2022   11:23 AM 12/20/2021    8:18 AM 11/07/2021    9:55 AM  Depression screen PHQ 2/9  Decreased Interest 0 0 0 0 0  Down, Depressed, Hopeless 0 0 0 0 0  PHQ - 2 Score 0 0 0 0 0  Altered sleeping 0 3 0  2  Tired, decreased energy 0 0 0  2  Change in appetite 0 0 0  2  Feeling bad or failure about yourself  0 0 0  0  Trouble concentrating 0 0 0  0  Moving slowly or fidgety/restless 0 0 0  0  Suicidal thoughts 0 0 0  0  PHQ-9 Score 0 3 0  6  Difficult doing work/chores Not difficult at all  Not difficult at all  Not difficult at all     Breast ca: patient reports she was supposed to get her second reconstruction surgery on the 29 but had to postpone due to illness.  Patient had a double mastectomy and reconstruction done by Dr. Dahlia Byes.  Her oncologist is Dr. Rogue Bussing.  She last saw  oncology on 04/17/2022.  No changes in treatment, follow up with them in 6 months. She is currently taking tamoxifen.    Carcinoma of upper-outer quadrant of left breast in female, estrogen receptor positive ([place]) #Left breast cancer T60m0- G-2; ER/PR positive HER2 negative status BIL postmastectomy [positive for CHEK-2 mutation].  Oncotype recurrence score 16/LOW. On Tamoxifen [s/p hysterectomy [intact ovaries]; no role for mammograms.  Allergies/asthma: she currently gets allergy shots weekly.  She also sees her allergist every six months.  She currently uses pataday, duoNeb, trelegy, flonase, astelin, albuterol and singulair. She reports her breathing has been okay she still has a cough from recent illness. .Marland Kitchen HTN:she currently takes losartan 25 mg daily.  She denies any chest pain, headaches, blurred vision or shortness of breath.  Her blood pressure today is 130/82.  Hyperlipidemia: Her last LDL was 168 on 11/07/2021, she is currently taking atorvastatin 10 mg at bedtime. She denies any myalgia.   The 10-year ASCVD risk score (Arnett DK, et al., 2019) is: 5%   Values used to calculate the score:     Age: 74 years     Sex: Female     Is Non-Hispanic African American: Yes     Diabetic: No     Tobacco smoker: No     Systolic Blood Pressure: 606 mmHg     Is BP treated: Yes     HDL Cholesterol: 54 mg/dL     Total Cholesterol: 251 mg/dL   Prediabetes:her last A1C was 6.3 on 11/07/2021.  She denies any polyphagia, polydipsia or polyuria.   Obesity: her weight today is 181 lbs with a BMI of 35.35.  Patient reports she tries to eat a healthier diet and get physical activity including walking.   Insomnia: she says she has a hard time staying asleep she has tried  Relevant past medical, surgical, family and social history reviewed and updated as indicated. Interim medical history since our last visit reviewed. Allergies and medications reviewed and updated.  Review of Systems Constitutional:  Negative for fever or weight change.  Respiratory: Negative for cough and shortness of breath.   Cardiovascular: Negative for chest pain or palpitations.  Gastrointestinal: Negative for abdominal pain, no bowel changes.  Musculoskeletal: Negative for gait problem or joint swelling.  Skin: Negative for rash.  Neurological: Negative for dizziness or headache.  No other specific complaints in a complete review of systems (except as listed in HPI above).      Objective:    BP 130/82   Pulse 96   Temp 98.5 F (36.9 C) (Oral)   Resp 16   Ht 5' (1.524 m)   Wt 181 lb (82.1 kg)   LMP 11/15/2014   SpO2 98%   BMI 35.35 kg/m   Wt Readings from Last 3 Encounters:  05/10/22 181 lb (82.1 kg)  04/30/22 183 lb (83 kg)  04/17/22 181 lb 11.2 oz (82.4 kg)    Physical Exam  Constitutional: Patient appears well-developed and well-nourished. Obese  No distress.  HEENT: head atraumatic, normocephalic, pupils equal and reactive to light, neck supple, throat within normal limits Cardiovascular: Normal rate, regular rhythm and normal heart sounds.  No murmur heard. No BLE edema. Pulmonary/Chest: Effort normal and breath sounds normal. No respiratory distress. Abdominal: Soft.  There is no tenderness. Psychiatric: Patient has a normal mood and affect. behavior is normal. Judgment and thought content normal.  Results for orders placed or performed in visit on 04/30/22  Novel Coronavirus, NAA ([facility])   Specimen: Nasopharyngeal(NP) swabs in vial transport medium   Nasopharynge  Previous  Result Value Ref Range   SARS-CoV-2, NAA Detected (A) Not Detected      Assessment & Plan:   Problem List Items Addressed This Visit       Cardiovascular and Mediastinum   Essential hypertension    Continue taking losartan 25 mg daily       Relevant Medications   losartan (COZAAR) 25 MG tablet   atorvastatin (LIPITOR) 10 MG tablet   Other Relevant Orders   CBC with Differential/Platelet   COMPLETE  METABOLIC PANEL WITH GFR     Respiratory   Asthma - Primary    Continue using nebulizer as needed. Continue using trelegy, albuterol and singulair.       Allergic rhinitis    Continue using pataday,  flonase, astelin and singulair        Other   Class 1 obesity due to excess calories with serious comorbidity and body mass index (BMI) of 33.0 to 33.9 in adult    Continue working on lifestyle modification.       Relevant Orders   CBC with Differential/Platelet   COMPLETE METABOLIC PANEL WITH GFR   Lipid panel   Hemoglobin A1c   Hyperlipidemia    Continue taking atorvastatin 10 mg daily      Relevant Medications   losartan (COZAAR) 25 MG tablet   atorvastatin (LIPITOR) 10 MG tablet   Other Relevant Orders   COMPLETE METABOLIC PANEL WITH GFR   Lipid panel   Prediabetes    Getting labs, discussed decreasing sugar in her diet      Relevant Orders   COMPLETE METABOLIC PANEL WITH GFR   Hemoglobin A1c   Carcinoma of upper-outer quadrant of left breast in female, estrogen receptor positive ([place])    Continue with follow up appointments with oncology and surgery.       Other Visit Diagnoses     Other insomnia       Relevant Medications   zolpidem (AMBIEN) 5 MG tablet   Medication management       Relevant Medications   losartan (COZAAR) 25 MG tablet        Follow up plan: Return in about 6 months (around 11/08/2022) for follow up.

## 2022-05-10 NOTE — Assessment & Plan Note (Signed)
Continue with follow up appointments with oncology and surgery.

## 2022-05-11 LAB — LIPID PANEL
Cholesterol: 237 mg/dL — ABNORMAL HIGH (ref <200)
HDL: 61 mg/dL (ref >=50)
LDL Cholesterol (Calc): 153 mg/dL (calc) — ABNORMAL HIGH
Non-HDL Cholesterol (Calc): 176 mg/dL (calc) — ABNORMAL HIGH (ref <130)
Total CHOL/HDL Ratio: 3.9 (calc) (ref <5.0)
Triglycerides: 117 mg/dL (ref <150)

## 2022-05-11 LAB — CBC WITH DIFFERENTIAL/PLATELET
Absolute Monocytes: 984 cells/uL — ABNORMAL HIGH (ref 200–950)
Basophils Absolute: 66 cells/uL (ref 0–200)
Basophils Relative: 0.4 %
Eosinophils Absolute: 98 cells/uL (ref 15–500)
Eosinophils Relative: 0.6 %
HCT: 37.4 % (ref 35.0–45.0)
Hemoglobin: 12.5 g/dL (ref 11.7–15.5)
Lymphs Abs: 3838 cells/uL (ref 850–3900)
MCH: 27.5 pg (ref 27.0–33.0)
MCHC: 33.4 g/dL (ref 32.0–36.0)
MCV: 82.4 fL (ref 80.0–100.0)
MPV: 10.2 fL (ref 7.5–12.5)
Monocytes Relative: 6 %
Neutro Abs: 11414 cells/uL — ABNORMAL HIGH (ref 1500–7800)
Neutrophils Relative %: 69.6 %
Platelets: 447 10*3/uL — ABNORMAL HIGH (ref 140–400)
RBC: 4.54 10*6/uL (ref 3.80–5.10)
RDW: 14.1 % (ref 11.0–15.0)
Total Lymphocyte: 23.4 %
WBC: 16.4 10*3/uL — ABNORMAL HIGH (ref 3.8–10.8)

## 2022-05-11 LAB — COMPLETE METABOLIC PANEL WITH GFR
AG Ratio: 1.4 (calc) (ref 1.0–2.5)
ALT: 23 U/L (ref 6–29)
AST: 13 U/L (ref 10–35)
Albumin: 4.3 g/dL (ref 3.6–5.1)
Alkaline phosphatase (APISO): 53 U/L (ref 37–153)
BUN: 12 mg/dL (ref 7–25)
CO2: 23 mmol/L (ref 20–32)
Calcium: 9.3 mg/dL (ref 8.6–10.4)
Chloride: 104 mmol/L (ref 98–110)
Creat: 0.83 mg/dL (ref 0.50–1.03)
Globulin: 3.1 g/dL (calc) (ref 1.9–3.7)
Glucose, Bld: 105 mg/dL — ABNORMAL HIGH (ref 65–99)
Potassium: 4.2 mmol/L (ref 3.5–5.3)
Sodium: 140 mmol/L (ref 135–146)
Total Bilirubin: 0.3 mg/dL (ref 0.2–1.2)
Total Protein: 7.4 g/dL (ref 6.1–8.1)
eGFR: 85 mL/min/{1.73_m2} (ref >=60)

## 2022-05-11 LAB — HEMOGLOBIN A1C
Hgb A1c MFr Bld: 6.8 % of total Hgb — ABNORMAL HIGH (ref <5.7)
Mean Plasma Glucose: 148 mg/dL
eAG (mmol/L): 8.2 mmol/L

## 2022-05-13 ENCOUNTER — Encounter: Payer: Self-pay | Admitting: Plastic Surgery

## 2022-05-16 ENCOUNTER — Ambulatory Visit: Payer: Commercial Managed Care - PPO | Admitting: Dermatology

## 2022-05-16 DIAGNOSIS — L821 Other seborrheic keratosis: Secondary | ICD-10-CM | POA: Diagnosis not present

## 2022-05-16 DIAGNOSIS — L820 Inflamed seborrheic keratosis: Secondary | ICD-10-CM

## 2022-05-16 NOTE — Progress Notes (Signed)
   New Patient Visit  Subjective  Tracey Huff is a 52 y.o. female who presents for the following: Skin Problem. The patient has spots on her face and neck to be evaluated, some may be new or changing and the patient has concerns that these could be cancer.   The following portions of the chart were reviewed this encounter and updated as appropriate:   Tobacco  Allergies  Meds  Problems  Med Hx  Surg Hx  Fam Hx     Review of Systems:  No other skin or systemic complaints except as noted in HPI or Assessment and Plan.  Objective  Well appearing patient in no apparent distress; mood and affect are within normal limits.  A focused examination was performed including face,neck . Relevant physical exam findings are noted in the Assessment and Plan.  left cheek, left temple (16) Stuck-on, waxy, tan-brown papules -- Discussed benign etiology and prognosis.    Assessment & Plan  Inflamed seborrheic keratosis x 16 left cheek, left temple Snip excision x 9 ED x 7  Symptomatic, irritating, patient would like treated.  Epidermal / dermal shaving - left cheek, left temple  Informed consent: discussed and consent obtained   Anesthesia: the lesion was anesthetized in a standard fashion   Anesthetic:  1% lidocaine w/ epinephrine 1-100,000 buffered w/ 8.4% NaHCO3 Instrument used: scissors   Hemostasis achieved with: pressure, aluminum chloride and electrodesiccation   Outcome: patient tolerated procedure well   Post-procedure details: wound care instructions given    Destruction of lesion - left cheek, left temple  Destruction method: electrodesiccation and curettage   Informed consent: discussed and consent obtained   Timeout:  patient name, date of birth, surgical site, and procedure verified Curettage performed in three different directions: Yes   Electrodesiccation performed over the curetted area: Yes   Hemostasis achieved with:  pressure, aluminum chloride and  electrodesiccation Outcome: patient tolerated procedure well with no complications   Post-procedure details: wound care instructions given    Seborrheic Keratoses - Stuck-on, waxy, tan-brown papules and/or plaques  - Benign-appearing - Discussed benign etiology and prognosis. - Observe - Call for any changes  Return in about 3 months (around 08/15/2022) for ISks .  IMarye Round, CMA, am acting as scribe for Sarina Ser, MD .  Documentation: I have reviewed the above documentation for accuracy and completeness, and I agree with the above.  Sarina Ser, MD

## 2022-05-16 NOTE — Patient Instructions (Signed)
Due to recent changes in healthcare laws, you may see results of your pathology and/or laboratory studies on MyChart before the doctors have had a chance to review them. We understand that in some cases there may be results that are confusing or concerning to you. Please understand that not all results are received at the same time and often the doctors may need to interpret multiple results in order to provide you with the best plan of care or course of treatment. Therefore, we ask that you please give us 2 business days to thoroughly review all your results before contacting the office for clarification. Should we see a critical lab result, you will be contacted sooner.   If You Need Anything After Your Visit  If you have any questions or concerns for your doctor, please call our main line at [phone] and press option 4 to reach your doctor's medical assistant. If no one answers, please leave a voicemail as directed and we will return your call as soon as possible. Messages left after 4 pm will be answered the following business day.   You may also send us a message via MyChart. We typically respond to MyChart messages within 1-2 business days.  For prescription refills, please ask your pharmacy to contact our office. Our fax number is [phone].  If you have an urgent issue when the clinic is closed that cannot wait until the next business day, you can page your doctor at the number below.    Please note that while we do our best to be available for urgent issues outside of office hours, we are not available 24/7.   If you have an urgent issue and are unable to reach us, you may choose to seek medical care at your doctor's office, retail clinic, urgent care center, or emergency room.  If you have a medical emergency, please immediately call 911 or go to the emergency department.  Pager Numbers  - Dr. Kowalski: [phone]  - Dr. Moye: [phone]  - Dr. Stewart:  [phone]  In the event of inclement weather, please call our main line at [phone] for an update on the status of any delays or closures.  Dermatology Medication Tips: Please keep the boxes that topical medications come in in order to help keep track of the instructions about where and how to use these. Pharmacies typically print the medication instructions only on the boxes and not directly on the medication tubes.   If your medication is too expensive, please contact our office at [phone] option 4 or send us a message through MyChart.   We are unable to tell what your co-pay for medications will be in advance as this is different depending on your insurance coverage. However, we may be able to find a substitute medication at lower cost or fill out paperwork to get insurance to cover a needed medication.   If a prior authorization is required to get your medication covered by your insurance company, please allow us 1-2 business days to complete this process.  Drug prices often vary depending on where the prescription is filled and some pharmacies may offer cheaper prices.  The website www.goodrx.com contains coupons for medications through different pharmacies. The prices here do not account for what the cost may be with help from insurance (it may be cheaper with your insurance), but the website can give you the price if you did not use any insurance.  - You can print the associated coupon and take it with   your prescription to the pharmacy.  - You may also stop by our office during regular business hours and pick up a GoodRx coupon card.  - If you need your prescription sent electronically to a different pharmacy, notify our office through [facility] MyChart or by phone at [phone] option 4.     Si Usted Necesita Algo Despus de Su Visita  Tambin puede enviarnos un mensaje a travs de MyChart. Por lo general respondemos a los mensajes de MyChart en el transcurso de 1 a 2  das hbiles.  Para renovar recetas, por favor pida a su farmacia que se ponga en contacto con nuestra oficina. Nuestro nmero de fax es el [phone].  Si tiene un asunto urgente cuando la clnica est cerrada y que no puede esperar hasta el siguiente da hbil, puede llamar/localizar a su doctor(a) al nmero que aparece a continuacin.   Por favor, tenga en cuenta que aunque hacemos todo lo posible para estar disponibles para asuntos urgentes fuera del horario de oficina, no estamos disponibles las 24 horas del da, los 7 das de la semana.   Si tiene un problema urgente y no puede comunicarse con nosotros, puede optar por buscar atencin mdica  en el consultorio de su doctor(a), en una clnica privada, en un centro de atencin urgente o en una sala de emergencias.  Si tiene una emergencia mdica, por favor llame inmediatamente al 911 o vaya a la sala de emergencias.  Nmeros de bper  - Dr. Kowalski: [phone]  - Dra. Moye: [phone]  - Dra. Stewart: [phone]  En caso de inclemencias del tiempo, por favor llame a nuestra lnea principal al [phone] para una actualizacin sobre el estado de cualquier retraso o cierre.  Consejos para la medicacin en dermatologa: Por favor, guarde las cajas en las que vienen los medicamentos de uso tpico para ayudarle a seguir las instrucciones sobre dnde y cmo usarlos. Las farmacias generalmente imprimen las instrucciones del medicamento slo en las cajas y no directamente en los tubos del medicamento.   Si su medicamento es muy caro, por favor, pngase en contacto con nuestra oficina llamando al [phone] y presione la opcin 4 o envenos un mensaje a travs de MyChart.   No podemos decirle cul ser su copago por los medicamentos por adelantado ya que esto es diferente dependiendo de la cobertura de su seguro. Sin embargo, es posible que podamos encontrar un medicamento sustituto a menor costo o llenar un formulario para que el  seguro cubra el medicamento que se considera necesario.   Si se requiere una autorizacin previa para que su compaa de seguros cubra su medicamento, por favor permtanos de 1 a 2 das hbiles para completar este proceso.  Los precios de los medicamentos varan con frecuencia dependiendo del lugar de dnde se surte la receta y alguna farmacias pueden ofrecer precios ms baratos.  El sitio web www.goodrx.com tiene cupones para medicamentos de diferentes farmacias. Los precios aqu no tienen en cuenta lo que podra costar con la ayuda del seguro (puede ser ms barato con su seguro), pero el sitio web puede darle el precio si no utiliz ningn seguro.  - Puede imprimir el cupn correspondiente y llevarlo con su receta a la farmacia.  - Tambin puede pasar por nuestra oficina durante el horario de atencin regular y recoger una tarjeta de cupones de GoodRx.  - Si necesita que su receta se enve electrnicamente a una farmacia diferente, informe a nuestra oficina a travs de MyChart de [facility]   o por telfono llamando al [phone] y presione la opcin 4.  

## 2022-05-21 ENCOUNTER — Encounter: Payer: Self-pay | Admitting: Dermatology

## 2022-05-23 ENCOUNTER — Ambulatory Visit (INDEPENDENT_AMBULATORY_CARE_PROVIDER_SITE_OTHER): Payer: Commercial Managed Care - PPO | Admitting: Psychologist

## 2022-05-23 ENCOUNTER — Encounter: Payer: Self-pay | Admitting: Physician Assistant

## 2022-05-23 DIAGNOSIS — F4322 Adjustment disorder with anxiety: Secondary | ICD-10-CM | POA: Diagnosis not present

## 2022-05-23 NOTE — Progress Notes (Signed)
[facility] Counselor/Therapist Progress Note  Patient ID: Tracey Huff, MRN: [id number],    Date: 05/23/2022  Time Spent: 03:05 pm to 03:45 pm; total time: 40 minutes   This session was held via in person. The patient consented to in-person therapy and was in the clinician's office. Limits of confidentiality were discussed with the patient.   Treatment Type: Individual Therapy  Reported Symptoms: Distress due to decline in husband's health  Mental Status Exam: Appearance:  Well Groomed     Behavior: Appropriate  Motor: Normal  Speech/Language:  Clear and Coherent  Affect: Appropriate  Mood: normal  Thought process: normal  Thought content:   WNL  Sensory/Perceptual disturbances:   WNL  Orientation: oriented to person, place, and time/date  Attention: Good  Concentration: Good  Memory: WNL  Fund of knowledge:  Good  Insight:   Fair  Judgment:  Good  Impulse Control: Good   Risk Assessment: Danger to Self:  No Self-injurious Behavior: No Danger to Others: No Duty to Warn:no Physical Aggression / Violence:No  Access to Firearms a concern: No  Gang Involvement:No   Subjective: Beginning the session, patient described herself as doing poorly. Elaborating, she disclosed that since our last appointment that the medical team for her husband Fritz Pickerel) discovered three more tumors in the brain and that his cancer has progressed. She disclosed that a provider told her that the cancer is terminal. She indicated that her husband is supposed to have an outpatient palliative care consult tomorrow with Josh. She processed the different emotions she was experiencing. She asked to follow up. She denied suicidal and homicidal ideation.   Interventions:  Worked on developing a therapeutic relationship using active listening and reflective statements. Provided emotional support using empathy and validation. Used summary statements. Normalized and validated expressed thoughts  and emotions. Reviewed events since the last session. Normalized and validated thoughts and emotions. Identified goals for the session. Provided psychoeducation about palliative care and capacity evaluations. Processed thoughts and emotions. Used socratic questions to assist the patient. Validated expressed emotions. Explored how the family can set aside differences to be present for Fritz Pickerel (patient's spouse). Assisted in problem solving. Discussed next steps for counseling.  Provided empathic statements. Assessed for suicidal and homicidal ideation.   Homework: Meet with the palliative care team  Next Session: Review homework, emotional support  Diagnosis: F43.22 adjustment disorder with anxiety   Plan:   Goals Work through the grieving process and face reality of own death Accept emotional support from others around them Live life to the fullest, event though time may be limited Become as knowledgeable about the medical condition  Reduce fear, anxiety about the health condition  Accept the illness Accept the role of psychological and behavioral factors  Stabilize anxiety level wile increasing ability to function Learn and implement coping skills that result in a reduction of anxiety  Alleviate depressive symptoms Recognize, accept, and cope with depressive feelings Develop healthy thinking patterns Develop healthy interpersonal relationships  Objectives target date for all objectives is 07/10/2022 Identify feelings associated with the illness Family members share with each other feelings Identify the losses or limitations that have been experienced Verbalize acceptance of the reality of the medical condition Commit to learning and implement a proactive approach to managing personal stresses Verbalize an understanding of the medical condition Work with therapist to develop a plan for coping with stress Learn and implement skills for managing stress Engage in social, productive  activities that are possible Engage in faith  based activities implement positive imagery Identify coping skills and sources of emotional support Patient's partner and family members verbalize their fears regarding severity of health condition Identify sources of emotional distress  Learning and implement calming skills to reduce overall anxiety Learn and implement problem solving strategies Identify and engage in pleasant activities Learning and implement personal and interpersonal skills to reduce anxiety and improve interpersonal relationships Learn to accept limitations in life and commit to tolerating, rather than avoiding, unpleasant emotions while accomplishing meaningful goals Identify major life conflicts from the past and present that form the basis for present anxiety Learn and implement behavioral strategies Verbalize an understanding and resolution of current interpersonal problems Learn and implement problem solving and decision making skills Learn and implement conflict resolution skills to resolve interpersonal problems Verbalize an understanding of healthy and unhealthy emotions verbalize insight into how past relationships may be influence current experiences with depression Use mindfulness and acceptance strategies and increase value based behavior  Increase hopeful statements about the future.   Interventions Teach about stress and ways to handle stress Assist the patient in developing a coping action plan for stressors Conduct skills based training for coping strategies Train problem focused skills Sort out what activities the individual can do Encourage patient to rely upon his/her spiritual faith Teach the patient to use guided imagery Probe and evaluate family's ability to provide emotional support Allow family to share their fears Assist the patient in identifying, sorting through, and verbalizing the various feelings generated by his/her medical condition Meet  with family members  Ask patient list out limitations  Use stress inoculation training  Use Acceptance and Commitment Therapy to help client accept uncomfortable realities in order to accomplish value-consistent goals Reinforce the client's insight into the role of his/her past emotional pain and present anxiety  Discuss examples demonstrating that unrealistic worry overestimates the probability of threats and underestimate patient's ability  Assist the patient in analyzing his or her worries Help patient understand that avoidance is reinforcing  Behavioral activation help the client explore the relationship, nature of the dispute,  Help the client develop new interpersonal skills and relationships Conduct Problem so living therapy Teach conflict resolution skills Use a process-experiential approach Conduct TLDP Conduct ACT  The patient and clinician reviewed the treatment plan on 08/07/2021. The patient approved of the treatment plan.   Conception Chancy, PsyD

## 2022-05-24 ENCOUNTER — Ambulatory Visit: Payer: 59 | Admitting: Internal Medicine

## 2022-05-27 ENCOUNTER — Ambulatory Visit (INDEPENDENT_AMBULATORY_CARE_PROVIDER_SITE_OTHER): Payer: Commercial Managed Care - PPO | Admitting: Psychologist

## 2022-05-27 DIAGNOSIS — F4322 Adjustment disorder with anxiety: Secondary | ICD-10-CM | POA: Diagnosis not present

## 2022-05-27 NOTE — Progress Notes (Signed)
[facility] Counselor/Therapist Progress Note  Patient ID: Tracey Huff, MRN: [id number],    Date: 05/27/2022  Time Spent: 01:03 pm to 01:45 pm; total time: 42 minutes   This session was held via in person. The patient consented to in-person therapy and was in the clinician's office. Limits of confidentiality were discussed with the patient.   Treatment Type: Individual Therapy  Reported Symptoms: Distress due to decline in husband's health and family conflict  Mental Status Exam: Appearance:  Well Groomed     Behavior: Appropriate  Motor: Normal  Speech/Language:  Clear and Coherent  Affect: Appropriate  Mood: normal  Thought process: normal  Thought content:   WNL  Sensory/Perceptual disturbances:   WNL  Orientation: oriented to person, place, and time/date  Attention: Good  Concentration: Good  Memory: WNL  Fund of knowledge:  Good  Insight:   Fair  Judgment:  Good  Impulse Control: Good   Risk Assessment: Danger to Self:  No Self-injurious Behavior: No Danger to Others: No Duty to Warn:no Physical Aggression / Violence:No  Access to Firearms a concern: No  Gang Involvement:No   Subjective: Beginning the session, patient described herself as doing okay as she reflected on her meeting with [employer]. From there, she voiced that there is family conflict occurring primarily surrounding her husband's ex-wife. She spent the session processing thoughts and emotions. She was open to the idea of returning to journaling, which has helped previously.  She was agreeable to homework and following up. She denied suicidal and homicidal ideation.   Interventions:  Worked on developing a therapeutic relationship using active listening and reflective statements. Provided emotional support using empathy and validation. Used summary statements. Normalized and validated expressed thoughts and emotions. Praised the patient for doing slightly better and explored what has  assisted the patient. Reviewed the meeting that patient experienced with the palliative care team. Normalized and validated experience. Processed the challenges occurring in the family system. Reviewed previously identified coping strategies. Challenged some of the thoughts expressed.  Provided empathic statements. Assessed for suicidal and homicidal ideation.   Homework: Begin to use journaling again  Next Session: Review homework, emotional support  Diagnosis: F43.22 adjustment disorder with anxiety   Plan:   Goals Work through the grieving process and face reality of own death Accept emotional support from others around them Live life to the fullest, event though time may be limited Become as knowledgeable about the medical condition  Reduce fear, anxiety about the health condition  Accept the illness Accept the role of psychological and behavioral factors  Stabilize anxiety level wile increasing ability to function Learn and implement coping skills that result in a reduction of anxiety  Alleviate depressive symptoms Recognize, accept, and cope with depressive feelings Develop healthy thinking patterns Develop healthy interpersonal relationships  Objectives target date for all objectives is 07/10/2022 Identify feelings associated with the illness Family members share with each other feelings Identify the losses or limitations that have been experienced Verbalize acceptance of the reality of the medical condition Commit to learning and implement a proactive approach to managing personal stresses Verbalize an understanding of the medical condition Work with therapist to develop a plan for coping with stress Learn and implement skills for managing stress Engage in social, productive activities that are possible Engage in faith based activities implement positive imagery Identify coping skills and sources of emotional support Patient's partner and family members verbalize their  fears regarding severity of health condition Identify sources of  emotional distress  Learning and implement calming skills to reduce overall anxiety Learn and implement problem solving strategies Identify and engage in pleasant activities Learning and implement personal and interpersonal skills to reduce anxiety and improve interpersonal relationships Learn to accept limitations in life and commit to tolerating, rather than avoiding, unpleasant emotions while accomplishing meaningful goals Identify major life conflicts from the past and present that form the basis for present anxiety Learn and implement behavioral strategies Verbalize an understanding and resolution of current interpersonal problems Learn and implement problem solving and decision making skills Learn and implement conflict resolution skills to resolve interpersonal problems Verbalize an understanding of healthy and unhealthy emotions verbalize insight into how past relationships may be influence current experiences with depression Use mindfulness and acceptance strategies and increase value based behavior  Increase hopeful statements about the future.   Interventions Teach about stress and ways to handle stress Assist the patient in developing a coping action plan for stressors Conduct skills based training for coping strategies Train problem focused skills Sort out what activities the individual can do Encourage patient to rely upon his/her spiritual faith Teach the patient to use guided imagery Probe and evaluate family's ability to provide emotional support Allow family to share their fears Assist the patient in identifying, sorting through, and verbalizing the various feelings generated by his/her medical condition Meet with family members  Ask patient list out limitations  Use stress inoculation training  Use Acceptance and Commitment Therapy to help client accept uncomfortable realities in order to accomplish  value-consistent goals Reinforce the client's insight into the role of his/her past emotional pain and present anxiety  Discuss examples demonstrating that unrealistic worry overestimates the probability of threats and underestimate patient's ability  Assist the patient in analyzing his or her worries Help patient understand that avoidance is reinforcing  Behavioral activation help the client explore the relationship, nature of the dispute,  Help the client develop new interpersonal skills and relationships Conduct Problem so living therapy Teach conflict resolution skills Use a process-experiential approach Conduct TLDP Conduct ACT  The patient and clinician reviewed the treatment plan on 08/07/2021. The patient approved of the treatment plan.   Conception Chancy, PsyD

## 2022-06-05 ENCOUNTER — Encounter: Payer: Self-pay | Admitting: Physician Assistant

## 2022-06-12 ENCOUNTER — Ambulatory Visit (INDEPENDENT_AMBULATORY_CARE_PROVIDER_SITE_OTHER): Payer: Commercial Managed Care - PPO | Admitting: Psychologist

## 2022-06-12 DIAGNOSIS — F4322 Adjustment disorder with anxiety: Secondary | ICD-10-CM

## 2022-06-12 NOTE — Progress Notes (Signed)
[place] Counselor/Therapist Progress Note  Patient ID: Tracey Huff, MRN: [id number],    Date: 06/12/2022  Time Spent: 03:08 pm to 03:46 pm; total time: 38 minutes   This session was held via in person. The patient consented to in-person therapy and was in the clinician's office. Limits of confidentiality were discussed with the patient.   Treatment Type: Individual Therapy  Reported Symptoms: Distress due to husband's health  Mental Status Exam: Appearance:  Well Groomed     Behavior: Appropriate  Motor: Normal  Speech/Language:  Clear and Coherent  Affect: Appropriate  Mood: normal  Thought process: normal  Thought content:   WNL  Sensory/Perceptual disturbances:   WNL  Orientation: oriented to person, place, and time/date  Attention: Good  Concentration: Good  Memory: WNL  Fund of knowledge:  Good  Insight:   Fair  Judgment:  Good  Impulse Control: Good   Risk Assessment: Danger to Self:  No Self-injurious Behavior: No Danger to Others: No Duty to Warn:no Physical Aggression / Violence:No  Access to Firearms a concern: No  Gang Involvement:No   Subjective: Beginning the session, patient described herself as fair. Continuing to talk, she reflected on events since the last session. Elaborating, she voiced that her husband indicated that he wants to get away before he dies, however the patient is apprehensive related to not being close to medical care that knows her husband's history. This led the patient to reflecting on other deaths that have been experienced in her family of origin. She processed thoughts and emotions that she was experiencing. She reflected on her coping strategies.  She was agreeable to homework and following up. She denied suicidal and homicidal ideation.   Interventions:  Worked on developing a therapeutic relationship using active listening and reflective statements. Provided emotional support using empathy and validation. Used  summary statements. Normalized and validated expressed thoughts and emotions. Reviewed events since the last session. Normalized and validated expressed thoughts. Identified goals for the session. Explored what fears patient has related to her husband's wishes. Validated expressed fears. Used socratic questions to assist the patient gain insight into self. Challenged some of the thoughts expressed by the patient. Explored alterative ways to meet her needs and the needs of her husband. Processed the grief related to others losses in her life. Normalized how patient typically responds to grief. Identified several themes including difficulty with emotions. Processed thoughts and emotions.  Provided empathic statements. Assessed for suicidal and homicidal ideation.   Homework: Use journal and talk with spouse about how to meet in the middle   Next Session: Review homework, emotional support. Help patient get comfortable with experiencing emotions  Diagnosis: F43.22 adjustment disorder with anxiety   Plan:   Goals Work through the grieving process and face reality of own death Accept emotional support from others around them Live life to the fullest, event though time may be limited Become as knowledgeable about the medical condition  Reduce fear, anxiety about the health condition  Accept the illness Accept the role of psychological and behavioral factors  Stabilize anxiety level wile increasing ability to function Learn and implement coping skills that result in a reduction of anxiety  Alleviate depressive symptoms Recognize, accept, and cope with depressive feelings Develop healthy thinking patterns Develop healthy interpersonal relationships  Objectives target date for all objectives is 07/10/2022 Identify feelings associated with the illness Family members share with each other feelings Identify the losses or limitations that have been experienced Verbalize acceptance of  the reality of the  medical condition Commit to learning and implement a proactive approach to managing personal stresses Verbalize an understanding of the medical condition Work with therapist to develop a plan for coping with stress Learn and implement skills for managing stress Engage in social, productive activities that are possible Engage in faith based activities implement positive imagery Identify coping skills and sources of emotional support Patient's partner and family members verbalize their fears regarding severity of health condition Identify sources of emotional distress  Learning and implement calming skills to reduce overall anxiety Learn and implement problem solving strategies Identify and engage in pleasant activities Learning and implement personal and interpersonal skills to reduce anxiety and improve interpersonal relationships Learn to accept limitations in life and commit to tolerating, rather than avoiding, unpleasant emotions while accomplishing meaningful goals Identify major life conflicts from the past and present that form the basis for present anxiety Learn and implement behavioral strategies Verbalize an understanding and resolution of current interpersonal problems Learn and implement problem solving and decision making skills Learn and implement conflict resolution skills to resolve interpersonal problems Verbalize an understanding of healthy and unhealthy emotions verbalize insight into how past relationships may be influence current experiences with depression Use mindfulness and acceptance strategies and increase value based behavior  Increase hopeful statements about the future.   Interventions Teach about stress and ways to handle stress Assist the patient in developing a coping action plan for stressors Conduct skills based training for coping strategies Train problem focused skills Sort out what activities the individual can do Encourage patient to rely upon  his/her spiritual faith Teach the patient to use guided imagery Probe and evaluate family's ability to provide emotional support Allow family to share their fears Assist the patient in identifying, sorting through, and verbalizing the various feelings generated by his/her medical condition Meet with family members  Ask patient list out limitations  Use stress inoculation training  Use Acceptance and Commitment Therapy to help client accept uncomfortable realities in order to accomplish value-consistent goals Reinforce the client's insight into the role of his/her past emotional pain and present anxiety  Discuss examples demonstrating that unrealistic worry overestimates the probability of threats and underestimate patient's ability  Assist the patient in analyzing his or her worries Help patient understand that avoidance is reinforcing  Behavioral activation help the client explore the relationship, nature of the dispute,  Help the client develop new interpersonal skills and relationships Conduct Problem so living therapy Teach conflict resolution skills Use a process-experiential approach Conduct TLDP Conduct ACT  The patient and clinician reviewed the treatment plan on 08/07/2021. The patient approved of the treatment plan.   Conception Chancy, PsyD

## 2022-06-26 ENCOUNTER — Ambulatory Visit (INDEPENDENT_AMBULATORY_CARE_PROVIDER_SITE_OTHER): Payer: Commercial Managed Care - PPO | Admitting: Psychologist

## 2022-06-26 DIAGNOSIS — F4322 Adjustment disorder with anxiety: Secondary | ICD-10-CM

## 2022-06-26 NOTE — Progress Notes (Signed)
[place] Counselor/Therapist Progress Note  Patient ID: Tracey Huff, MRN: [id number],    Date: 06/26/2022  Time Spent: 03:07 pm to 03:45 pm; total time: 38 minutes   This session was held via video webex teletherapy due to the coronavirus risk at this time. The patient consented to video teletherapy and was located at her home during this session. She is aware it is the responsibility of the patient to secure confidentiality on her end of the session. The provider was in a private home office for the duration of this session. Limits of confidentiality were discussed with the patient.   Treatment Type: Individual Therapy  Reported Symptoms: Distress due to two different events in life  Mental Status Exam: Appearance:  Well Groomed     Behavior: Appropriate  Motor: Normal  Speech/Language:  Clear and Coherent  Affect: Appropriate  Mood: normal  Thought process: normal  Thought content:   WNL  Sensory/Perceptual disturbances:   WNL  Orientation: oriented to person, place, and time/date  Attention: Good  Concentration: Good  Memory: WNL  Fund of knowledge:  Good  Insight:   Fair  Judgment:  Good  Impulse Control: Good   Risk Assessment: Danger to Self:  No Self-injurious Behavior: No Danger to Others: No Duty to Warn:no Physical Aggression / Violence:No  Access to Firearms a concern: No  Gang Involvement:No   Subjective: Beginning the session, patient described herself as okay while voicing that she has experienced some challenging events in the last week. Patient spent the session reflecting and processing the different events. She reflected on ways that she has been implementing self-care to assist her. She processed thoughts and emotions.  She was agreeable to homework and following up. She denied suicidal and homicidal ideation.   Interventions:  Worked on developing a therapeutic relationship using active listening and reflective statements. Provided  emotional support using empathy and validation. Used summary statements. Normalized and validated expressed thoughts and emotions. Explored any upcoming events patient has in her life. Normalized and validated thoughts. Praised patient for spouse coming home. Identified goals for the session. Processed the strong emotions related to current circumstances. Used socratic questions to assist the patient. Reviewed ways that patient was implementing self-care into life. Explored different ways to express anger that patient is experiencing. Processed thoughts and emotions.  Provided empathic statements. Assessed for suicidal and homicidal ideation.   Homework: Use coping skills and resources to prevent from being burned out  Next Session: Review homework, emotional support.   Diagnosis: F43.22 adjustment disorder with anxiety   Plan:   Goals Work through the grieving process and face reality of own death Accept emotional support from others around them Live life to the fullest, event though time may be limited Become as knowledgeable about the medical condition  Reduce fear, anxiety about the health condition  Accept the illness Accept the role of psychological and behavioral factors  Stabilize anxiety level wile increasing ability to function Learn and implement coping skills that result in a reduction of anxiety  Alleviate depressive symptoms Recognize, accept, and cope with depressive feelings Develop healthy thinking patterns Develop healthy interpersonal relationships  Objectives target date for all objectives is 07/10/2022 Identify feelings associated with the illness Family members share with each other feelings Identify the losses or limitations that have been experienced Verbalize acceptance of the reality of the medical condition Commit to learning and implement a proactive approach to managing personal stresses Verbalize an understanding of the medical condition Work  with therapist  to develop a plan for coping with stress Learn and implement skills for managing stress Engage in social, productive activities that are possible Engage in faith based activities implement positive imagery Identify coping skills and sources of emotional support Patient's partner and family members verbalize their fears regarding severity of health condition Identify sources of emotional distress  Learning and implement calming skills to reduce overall anxiety Learn and implement problem solving strategies Identify and engage in pleasant activities Learning and implement personal and interpersonal skills to reduce anxiety and improve interpersonal relationships Learn to accept limitations in life and commit to tolerating, rather than avoiding, unpleasant emotions while accomplishing meaningful goals Identify major life conflicts from the past and present that form the basis for present anxiety Learn and implement behavioral strategies Verbalize an understanding and resolution of current interpersonal problems Learn and implement problem solving and decision making skills Learn and implement conflict resolution skills to resolve interpersonal problems Verbalize an understanding of healthy and unhealthy emotions verbalize insight into how past relationships may be influence current experiences with depression Use mindfulness and acceptance strategies and increase value based behavior  Increase hopeful statements about the future.   Interventions Teach about stress and ways to handle stress Assist the patient in developing a coping action plan for stressors Conduct skills based training for coping strategies Train problem focused skills Sort out what activities the individual can do Encourage patient to rely upon his/her spiritual faith Teach the patient to use guided imagery Probe and evaluate family's ability to provide emotional support Allow family to share their fears Assist the  patient in identifying, sorting through, and verbalizing the various feelings generated by his/her medical condition Meet with family members  Ask patient list out limitations  Use stress inoculation training  Use Acceptance and Commitment Therapy to help client accept uncomfortable realities in order to accomplish value-consistent goals Reinforce the client's insight into the role of his/her past emotional pain and present anxiety  Discuss examples demonstrating that unrealistic worry overestimates the probability of threats and underestimate patient's ability  Assist the patient in analyzing his or her worries Help patient understand that avoidance is reinforcing  Behavioral activation help the client explore the relationship, nature of the dispute,  Help the client develop new interpersonal skills and relationships Conduct Problem so living therapy Teach conflict resolution skills Use a process-experiential approach Conduct TLDP Conduct ACT  The patient and clinician reviewed the treatment plan on 08/07/2021. The patient approved of the treatment plan.   Conception Chancy, PsyD

## 2022-07-10 ENCOUNTER — Ambulatory Visit (INDEPENDENT_AMBULATORY_CARE_PROVIDER_SITE_OTHER): Payer: Commercial Managed Care - PPO | Admitting: Psychologist

## 2022-07-10 DIAGNOSIS — F4322 Adjustment disorder with anxiety: Secondary | ICD-10-CM

## 2022-07-10 NOTE — Progress Notes (Signed)
[place] Counselor/Therapist Progress Note  Patient ID: Tracey Huff, MRN: [id number],    Date: 07/10/2022  Time Spent: 10:03 am to 10:45 am; total time: 42 minutes   This session was held via video webex teletherapy due to the coronavirus risk at this time. The patient consented to video teletherapy and was located at her home during this session. She is aware it is the responsibility of the patient to secure confidentiality on her end of the session. The provider was in a private home office for the duration of this session. Limits of confidentiality were discussed with the patient.   Treatment Type: Individual Therapy  Reported Symptoms: Care giving burn out  Mental Status Exam: Appearance:  Well Groomed     Behavior: Appropriate  Motor: Normal  Speech/Language:  Clear and Coherent  Affect: Appropriate  Mood: normal  Thought process: normal  Thought content:   WNL  Sensory/Perceptual disturbances:   WNL  Orientation: oriented to person, place, and time/date  Attention: Good  Concentration: Good  Memory: WNL  Fund of knowledge:  Good  Insight:   Fair  Judgment:  Good  Impulse Control: Good   Risk Assessment: Danger to Self:  No Self-injurious Behavior: No Danger to Others: No Duty to Warn:no Physical Aggression / Violence:No  Access to Firearms a concern: No  Gang Involvement:No   Subjective: Beginning the session, patient described herself as fair. Elaborating, she spent the session processing emotions, thoughts, and frustrations with caregiver burnout. Specifically, patient indicated that she needed to get away and go to the beach. Patient reflected on how to accomplish this goal. From there, patient stated understanding regarding transition of therapy. She asked whether or not can there be a transition session with that therapist. She was agreeable to homework and following up. She denied suicidal and homicidal ideation.   Interventions:  Worked on  developing a therapeutic relationship using active listening and reflective statements. Provided emotional support using empathy and validation. Used summary statements. Normalized and validated expressed thoughts and emotions. Praised the patient for doing okay. Processed what challenges that the patient has experienced. Normalized and validated frustrations with the healthcare system and with extended family members. Used socratic questions to assist the patient gain insight. Challenged some of the thoughts expressed. Validated frustrations that were voiced. Identified the theme of caregiver burnout. Processed what steps patient needs to take to care for self. Processed and assisted in problem solving potential problems. Disclosed to patient about the transition process. Normalized and validated emotions. Validated that it is hard for the patient to "trust" others. Discussed next steps moving forward. Provided empathic statements. Assessed for suicidal and homicidal ideation.   Homework: Reflect on what made it easy for patient to feel comfortable opening up with this clinician   Next Session: Review homework, emotional support.   Diagnosis: F43.22 adjustment disorder with anxiety   Plan:   Goals Work through the grieving process and face reality of own death Accept emotional support from others around them Live life to the fullest, event though time may be limited Become as knowledgeable about the medical condition  Reduce fear, anxiety about the health condition  Accept the illness Accept the role of psychological and behavioral factors  Stabilize anxiety level wile increasing ability to function Learn and implement coping skills that result in a reduction of anxiety  Alleviate depressive symptoms Recognize, accept, and cope with depressive feelings Develop healthy thinking patterns Develop healthy interpersonal relationships  Objectives target date for all  objectives is  07/10/2023 Identify feelings associated with the illness Family members share with each other feelings Identify the losses or limitations that have been experienced Verbalize acceptance of the reality of the medical condition Commit to learning and implement a proactive approach to managing personal stresses Verbalize an understanding of the medical condition Work with therapist to develop a plan for coping with stress Learn and implement skills for managing stress Engage in social, productive activities that are possible Engage in faith based activities implement positive imagery Identify coping skills and sources of emotional support Patient's partner and family members verbalize their fears regarding severity of health condition Identify sources of emotional distress  Learning and implement calming skills to reduce overall anxiety Learn and implement problem solving strategies Identify and engage in pleasant activities Learning and implement personal and interpersonal skills to reduce anxiety and improve interpersonal relationships Learn to accept limitations in life and commit to tolerating, rather than avoiding, unpleasant emotions while accomplishing meaningful goals Identify major life conflicts from the past and present that form the basis for present anxiety Learn and implement behavioral strategies Verbalize an understanding and resolution of current interpersonal problems Learn and implement problem solving and decision making skills Learn and implement conflict resolution skills to resolve interpersonal problems Verbalize an understanding of healthy and unhealthy emotions verbalize insight into how past relationships may be influence current experiences with depression Use mindfulness and acceptance strategies and increase value based behavior  Increase hopeful statements about the future.   Interventions Teach about stress and ways to handle stress Assist the patient in  developing a coping action plan for stressors Conduct skills based training for coping strategies Train problem focused skills Sort out what activities the individual can do Encourage patient to rely upon his/her spiritual faith Teach the patient to use guided imagery Probe and evaluate family's ability to provide emotional support Allow family to share their fears Assist the patient in identifying, sorting through, and verbalizing the various feelings generated by his/her medical condition Meet with family members  Ask patient list out limitations  Use stress inoculation training  Use Acceptance and Commitment Therapy to help client accept uncomfortable realities in order to accomplish value-consistent goals Reinforce the client's insight into the role of his/her past emotional pain and present anxiety  Discuss examples demonstrating that unrealistic worry overestimates the probability of threats and underestimate patient's ability  Assist the patient in analyzing his or her worries Help patient understand that avoidance is reinforcing  Behavioral activation help the client explore the relationship, nature of the dispute,  Help the client develop new interpersonal skills and relationships Conduct Problem so living therapy Teach conflict resolution skills Use a process-experiential approach Conduct TLDP Conduct ACT  The patient and clinician reviewed the treatment plan on 08/07/2021. The patient reevaluated the treatment plan on 07/10/2022. The patient approved of the treatment plan.   Conception Chancy, PsyD

## 2022-07-17 ENCOUNTER — Ambulatory Visit (INDEPENDENT_AMBULATORY_CARE_PROVIDER_SITE_OTHER): Payer: Commercial Managed Care - PPO | Admitting: Psychologist

## 2022-07-17 DIAGNOSIS — F4322 Adjustment disorder with anxiety: Secondary | ICD-10-CM | POA: Diagnosis not present

## 2022-07-17 NOTE — Progress Notes (Signed)
[place] Counselor/Therapist Progress Note  Patient ID: Tracey Huff, MRN: [id number],    Date: 07/17/2022  Time Spent: 02:07 pm to 02:45 pm; total time: 38 minutes  This session was held via in person. The patient consented to in-person therapy and was in the clinician's office. Limits of confidentiality were discussed with the patient.    Treatment Type: Individual Therapy  Reported Symptoms: Challenges in different areas of life  Mental Status Exam: Appearance:  Well Groomed     Behavior: Appropriate  Motor: Normal  Speech/Language:  Clear and Coherent  Affect: Appropriate  Mood: normal  Thought process: normal  Thought content:   WNL  Sensory/Perceptual disturbances:   WNL  Orientation: oriented to person, place, and time/date  Attention: Good  Concentration: Good  Memory: WNL  Fund of knowledge:  Good  Insight:   Fair  Judgment:  Good  Impulse Control: Good   Risk Assessment: Danger to Self:  No Self-injurious Behavior: No Danger to Others: No Duty to Warn:no Physical Aggression / Violence:No  Access to Firearms a concern: No  Gang Involvement:No   Subjective: Beginning the session, patient described herself as okay, while reviewing events since the last session. Patient voiced being nervous for spouse's appointment on Friday as she is afraid that cancer may be located in other parts of his body. She also talked about how she is making arrangements for his death and processed emotions related to that situation. She talked about the balance of caring for self while also being present for others. She also reflected on what helped to develop trust in the clinician. She processed thoughts related to the transition. She was agreeable to homework and following up. She denied suicidal and homicidal ideation.   Interventions:  Worked on developing a therapeutic relationship using active listening and reflective statements. Provided emotional support using  empathy and validation. Used summary statements. Normalized and validated expressed thoughts and emotions. Reviewed events since the last session. Processed emotions about upcoming events. Identified goals for the session. Processed thoughts related to making arrangements for husband's death. Explored the idea of including him as part of that process. Processed thoughts and emotions. Challenged some of the thoughts expressed. Processed the idea of self-care and explored challenges with expressing love to self like she expresses towards others. Processed the relationship with the therapist. Explored what made the patient comfortable with the therapist. Discussed next steps. Provided empathic statements. Assessed for suicidal and homicidal ideation.   Homework: Reflect on what makes it easier to show love towards others than self  Next Session: Review homework, emotional support.   Diagnosis: F43.22 adjustment disorder with anxiety   Plan:   Goals Work through the grieving process and face reality of own death Accept emotional support from others around them Live life to the fullest, event though time may be limited Become as knowledgeable about the medical condition  Reduce fear, anxiety about the health condition  Accept the illness Accept the role of psychological and behavioral factors  Stabilize anxiety level wile increasing ability to function Learn and implement coping skills that result in a reduction of anxiety  Alleviate depressive symptoms Recognize, accept, and cope with depressive feelings Develop healthy thinking patterns Develop healthy interpersonal relationships  Objectives target date for all objectives is 07/10/2023 Identify feelings associated with the illness Family members share with each other feelings Identify the losses or limitations that have been experienced Verbalize acceptance of the reality of the medical condition Commit to learning and  implement a proactive  approach to managing personal stresses Verbalize an understanding of the medical condition Work with therapist to develop a plan for coping with stress Learn and implement skills for managing stress Engage in social, productive activities that are possible Engage in faith based activities implement positive imagery Identify coping skills and sources of emotional support Patient's partner and family members verbalize their fears regarding severity of health condition Identify sources of emotional distress  Learning and implement calming skills to reduce overall anxiety Learn and implement problem solving strategies Identify and engage in pleasant activities Learning and implement personal and interpersonal skills to reduce anxiety and improve interpersonal relationships Learn to accept limitations in life and commit to tolerating, rather than avoiding, unpleasant emotions while accomplishing meaningful goals Identify major life conflicts from the past and present that form the basis for present anxiety Learn and implement behavioral strategies Verbalize an understanding and resolution of current interpersonal problems Learn and implement problem solving and decision making skills Learn and implement conflict resolution skills to resolve interpersonal problems Verbalize an understanding of healthy and unhealthy emotions verbalize insight into how past relationships may be influence current experiences with depression Use mindfulness and acceptance strategies and increase value based behavior  Increase hopeful statements about the future.   Interventions Teach about stress and ways to handle stress Assist the patient in developing a coping action plan for stressors Conduct skills based training for coping strategies Train problem focused skills Sort out what activities the individual can do Encourage patient to rely upon his/her spiritual faith Teach the patient to use guided  imagery Probe and evaluate family's ability to provide emotional support Allow family to share their fears Assist the patient in identifying, sorting through, and verbalizing the various feelings generated by his/her medical condition Meet with family members  Ask patient list out limitations  Use stress inoculation training  Use Acceptance and Commitment Therapy to help client accept uncomfortable realities in order to accomplish value-consistent goals Reinforce the client's insight into the role of his/her past emotional pain and present anxiety  Discuss examples demonstrating that unrealistic worry overestimates the probability of threats and underestimate patient's ability  Assist the patient in analyzing his or her worries Help patient understand that avoidance is reinforcing  Behavioral activation help the client explore the relationship, nature of the dispute,  Help the client develop new interpersonal skills and relationships Conduct Problem so living therapy Teach conflict resolution skills Use a process-experiential approach Conduct TLDP Conduct ACT  The patient and clinician reviewed the treatment plan on 08/07/2021. The patient reevaluated the treatment plan on 07/10/2022. The patient approved of the treatment plan.   Conception Chancy, PsyD

## 2022-07-22 ENCOUNTER — Other Ambulatory Visit: Payer: Self-pay | Admitting: Emergency Medicine

## 2022-07-22 ENCOUNTER — Other Ambulatory Visit (HOSPITAL_COMMUNITY): Payer: Self-pay

## 2022-07-22 DIAGNOSIS — Z79899 Other long term (current) drug therapy: Secondary | ICD-10-CM

## 2022-07-22 DIAGNOSIS — I10 Essential (primary) hypertension: Secondary | ICD-10-CM

## 2022-07-22 MED ORDER — LOSARTAN POTASSIUM 25 MG PO TABS
25.0000 mg | ORAL_TABLET | ORAL | 0 refills | Status: DC
  Filled 2022-07-22: qty 90, 90d supply, fill #0

## 2022-07-22 NOTE — Telephone Encounter (Signed)
Medication called to wrong pharmacy. I called and cancelled script out at [facility]. Needs to go to [facility]

## 2022-07-22 NOTE — Telephone Encounter (Unsigned)
Copied from [place] [id number]. Topic: General - Other >> Jul 22, 2022  8:42 AM Oley Balm A wrote: Reason for CRM: Pt states that her medication was sent to the wrong pharmacy ([facility]). Could you please send pts medication to the correct pharmacy losartan (COZAAR) 25 MG tablet. [place]  Phone: [phone] Fax: [phone]

## 2022-07-23 DIAGNOSIS — J301 Allergic rhinitis due to pollen: Secondary | ICD-10-CM | POA: Diagnosis not present

## 2022-07-24 DIAGNOSIS — J3089 Other allergic rhinitis: Secondary | ICD-10-CM | POA: Diagnosis not present

## 2022-07-24 DIAGNOSIS — J3081 Allergic rhinitis due to animal (cat) (dog) hair and dander: Secondary | ICD-10-CM | POA: Diagnosis not present

## 2022-07-25 ENCOUNTER — Ambulatory Visit: Payer: Self-pay

## 2022-07-25 ENCOUNTER — Ambulatory Visit (INDEPENDENT_AMBULATORY_CARE_PROVIDER_SITE_OTHER): Payer: Commercial Managed Care - PPO | Admitting: Psychologist

## 2022-07-25 DIAGNOSIS — F4322 Adjustment disorder with anxiety: Secondary | ICD-10-CM
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
## 2022-07-25 NOTE — Progress Notes (Signed)
[place] Counselor/Therapist Progress Note  Patient ID: Tracey Huff, MRN: [id number],    Date: 07/25/2022  Time Spent: 11:00 am to 11:40 am; total time: 40 minutes   This session was held via video webex teletherapy due to the coronavirus risk at this time. The patient consented to video teletherapy and was located at her home during this session. She is aware it is the responsibility of the patient to secure confidentiality on her end of the session. The provider was in a private home office for the duration of this session. Limits of confidentiality were discussed with the patient.   Treatment Type: Individual Therapy  Reported Symptoms: Anger  Mental Status Exam: Appearance:  Well Groomed     Behavior: Appropriate  Motor: Normal  Speech/Language:  Clear and Coherent  Affect: Appropriate  Mood: normal  Thought process: normal  Thought content:   WNL  Sensory/Perceptual disturbances:   WNL  Orientation: oriented to person, place, and time/date  Attention: Good  Concentration: Good  Memory: WNL  Fund of knowledge:  Good  Insight:   Fair  Judgment:  Good  Impulse Control: Good   Risk Assessment: Danger to Self:  No Self-injurious Behavior: No Danger to Others: No Duty to Warn:no Physical Aggression / Violence:No  Access to Firearms a concern: No  Gang Involvement:No   Subjective: Beginning the session, the patient described herself as being angry. Elaborating, she voiced that she is angry at multiple people in her life. She reflected on the different reasons why she is angry. Patient also spent time reflecting on the ways to get breaks due to her anger. She was agreeable to following up. She denied suicidal and homicidal ideation.   Interventions:  Worked on developing a therapeutic relationship using active listening and reflective statements. Provided emotional support using empathy and validation. Used summary statements. Normalized and validated  expressed thoughts and emotions. Explored plans for the upcoming weekend. Reviewed events since the last session. Used socratic questions to assist the patient gain insight into self. Challenged some of the thoughts expressed. Used a Product/process development scientist to assist the patient. Processed ways to implement self-care.  Provided empathic statements. Assessed for suicidal and homicidal ideation.   Homework: Self-care  Next Session: Review homework, emotional support. Transition patient  Diagnosis: F43.22 adjustment disorder with anxiety   Plan:   Goals Work through the grieving process and face reality of own death Accept emotional support from others around them Live life to the fullest, event though time may be limited Become as knowledgeable about the medical condition  Reduce fear, anxiety about the health condition  Accept the illness Accept the role of psychological and behavioral factors  Stabilize anxiety level wile increasing ability to function Learn and implement coping skills that result in a reduction of anxiety  Alleviate depressive symptoms Recognize, accept, and cope with depressive feelings Develop healthy thinking patterns Develop healthy interpersonal relationships  Objectives target date for all objectives is 07/10/2023 Identify feelings associated with the illness Family members share with each other feelings Identify the losses or limitations that have been experienced Verbalize acceptance of the reality of the medical condition Commit to learning and implement a proactive approach to managing personal stresses Verbalize an understanding of the medical condition Work with therapist to develop a plan for coping with stress Learn and implement skills for managing stress Engage in social, productive activities that are possible Engage in faith based activities implement positive imagery Identify coping skills and sources of emotional support  Patient's partner and family members  verbalize their fears regarding severity of health condition Identify sources of emotional distress  Learning and implement calming skills to reduce overall anxiety Learn and implement problem solving strategies Identify and engage in pleasant activities Learning and implement personal and interpersonal skills to reduce anxiety and improve interpersonal relationships Learn to accept limitations in life and commit to tolerating, rather than avoiding, unpleasant emotions while accomplishing meaningful goals Identify major life conflicts from the past and present that form the basis for present anxiety Learn and implement behavioral strategies Verbalize an understanding and resolution of current interpersonal problems Learn and implement problem solving and decision making skills Learn and implement conflict resolution skills to resolve interpersonal problems Verbalize an understanding of healthy and unhealthy emotions verbalize insight into how past relationships may be influence current experiences with depression Use mindfulness and acceptance strategies and increase value based behavior  Increase hopeful statements about the future.   Interventions Teach about stress and ways to handle stress Assist the patient in developing a coping action plan for stressors Conduct skills based training for coping strategies Train problem focused skills Sort out what activities the individual can do Encourage patient to rely upon his/her spiritual faith Teach the patient to use guided imagery Probe and evaluate family's ability to provide emotional support Allow family to share their fears Assist the patient in identifying, sorting through, and verbalizing the various feelings generated by his/her medical condition Meet with family members  Ask patient list out limitations  Use stress inoculation training  Use Acceptance and Commitment Therapy to help client accept uncomfortable realities in order to  accomplish value-consistent goals Reinforce the client's insight into the role of his/her past emotional pain and present anxiety  Discuss examples demonstrating that unrealistic worry overestimates the probability of threats and underestimate patient's ability  Assist the patient in analyzing his or her worries Help patient understand that avoidance is reinforcing  Behavioral activation help the client explore the relationship, nature of the dispute,  Help the client develop new interpersonal skills and relationships Conduct Problem so living therapy Teach conflict resolution skills Use a process-experiential approach Conduct TLDP Conduct ACT  The patient and clinician reviewed the treatment plan on 08/07/2021. The patient reevaluated the treatment plan on 07/10/2022. The patient approved of the treatment plan.   Conception Chancy, PsyD
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
## 2022-07-26 ENCOUNTER — Other Ambulatory Visit (HOSPITAL_COMMUNITY): Payer: Self-pay

## 2022-07-26 ENCOUNTER — Encounter: Payer: Self-pay | Admitting: Nurse Practitioner

## 2022-07-26 ENCOUNTER — Other Ambulatory Visit: Payer: Self-pay

## 2022-07-26 ENCOUNTER — Ambulatory Visit: Payer: Commercial Managed Care - PPO | Admitting: Nurse Practitioner

## 2022-07-26 VITALS — BP 128/64 | HR 91 | Temp 98.4°F | Resp 16 | Ht 60.0 in | Wt 179.7 lb

## 2022-07-26 DIAGNOSIS — E1165 Type 2 diabetes mellitus with hyperglycemia: Secondary | ICD-10-CM | POA: Diagnosis not present

## 2022-07-26 DIAGNOSIS — Z794 Long term (current) use of insulin: Secondary | ICD-10-CM | POA: Diagnosis not present

## 2022-07-26 DIAGNOSIS — J4531 Mild persistent asthma with (acute) exacerbation: Secondary | ICD-10-CM

## 2022-07-26 MED ORDER — OZEMPIC (0.25 OR 0.5 MG/DOSE) 2 MG/3ML ~~LOC~~ SOPN
0.2500 mg | PEN_INJECTOR | SUBCUTANEOUS | 0 refills | Status: DC
  Filled 2022-07-26: qty 3, 30d supply, fill #0

## 2022-07-26 MED ORDER — PREDNISONE 10 MG PO TABS
ORAL_TABLET | ORAL | 0 refills | Status: DC
  Filled 2022-07-26 (×2): qty 21, 6d supply, fill #0

## 2022-07-26 MED ORDER — BENZONATATE 100 MG PO CAPS
200.0000 mg | ORAL_CAPSULE | Freq: Two times a day (BID) | ORAL | 0 refills | Status: DC | PRN
  Filled 2022-07-26: qty 20, 5d supply, fill #0

## 2022-07-26 NOTE — Assessment & Plan Note (Signed)
Start ozempic 0.25 weekly.

## 2022-08-01 ENCOUNTER — Ambulatory Visit (INDEPENDENT_AMBULATORY_CARE_PROVIDER_SITE_OTHER): Payer: Commercial Managed Care - PPO | Admitting: Psychologist

## 2022-08-01 DIAGNOSIS — F4322 Adjustment disorder with anxiety: Secondary | ICD-10-CM | POA: Diagnosis not present

## 2022-08-01 NOTE — Progress Notes (Signed)
[place] Counselor/Therapist Progress Note  Patient ID: Tracey Huff, MRN: [id number],    Date: 08/01/2022  Time Spent: 02:00 pm to 02:25 pm; total time: 25 minutes   This session was held via video webex teletherapy due to the coronavirus risk at this time. The patient consented to video teletherapy and was located at her home during this session. She is aware it is the responsibility of the patient to secure confidentiality on her end of the session. The provider was in a private home office for the duration of this session. Limits of confidentiality were discussed with the patient.   Treatment Type: Individual Therapy  Reported Symptoms: Denied symptoms  Mental Status Exam: Appearance:  Well Groomed     Behavior: Appropriate  Motor: Normal  Speech/Language:  Clear and Coherent  Affect: Appropriate  Mood: normal  Thought process: normal  Thought content:   WNL  Sensory/Perceptual disturbances:   WNL  Orientation: oriented to person, place, and time/date  Attention: Good  Concentration: Good  Memory: WNL  Fund of knowledge:  Good  Insight:   Fair  Judgment:  Good  Impulse Control: Good   Risk Assessment: Danger to Self:  No Self-injurious Behavior: No Danger to Others: No Duty to Warn:no Physical Aggression / Violence:No  Access to Firearms a concern: No  Gang Involvement:No   Subjective: Beginning the session, the patient described herself as doing well and denied any immediate concerns. From there, she spent time reflecting on her experience in counseling. She processed what she learned about herself and what she wants to continue working towards. Specifically, patient wants to "find herself". She processed how the therapeutic relationship assisted her. She processed thoughts and emotions. She denied suicidal and homicidal ideation.   Interventions:  Worked on developing a therapeutic relationship using active listening and reflective statements.  Provided emotional support using empathy and validation. Reviewed events since the last session. Praised the patient for doing well and explored what has assisted. Discussed upcoming events. Processed thoughts and emotions. Reflected on patient's experience in counseling. Assisted the patient in identifying what she wants to focus on moving forward. Processed how the patient grew in counseling. Reflected on the therapeutic relationship. Praised patient for her growth. Provided psychoeducation about transitioning to new therapist. Provided empathic statements. Assessed for suicidal and homicidal ideation.   Homework: NA  Next Session: NA. Patient will transition to new therapist as this clinician leaves the healthcare system.   Diagnosis: F43.22 adjustment disorder with anxiety   Plan:   Goals Work through the grieving process and face reality of own death Accept emotional support from others around them Live life to the fullest, event though time may be limited Become as knowledgeable about the medical condition  Reduce fear, anxiety about the health condition  Accept the illness Accept the role of psychological and behavioral factors  Stabilize anxiety level wile increasing ability to function Learn and implement coping skills that result in a reduction of anxiety  Alleviate depressive symptoms Recognize, accept, and cope with depressive feelings Develop healthy thinking patterns Develop healthy interpersonal relationships  Objectives target date for all objectives is 07/10/2023 Identify feelings associated with the illness Family members share with each other feelings Identify the losses or limitations that have been experienced Verbalize acceptance of the reality of the medical condition Commit to learning and implement a proactive approach to managing personal stresses Verbalize an understanding of the medical condition Work with therapist to develop a plan for coping with  stress Learn and implement skills for managing stress Engage in social, productive activities that are possible Engage in faith based activities implement positive imagery Identify coping skills and sources of emotional support Patient's partner and family members verbalize their fears regarding severity of health condition Identify sources of emotional distress  Learning and implement calming skills to reduce overall anxiety Learn and implement problem solving strategies Identify and engage in pleasant activities Learning and implement personal and interpersonal skills to reduce anxiety and improve interpersonal relationships Learn to accept limitations in life and commit to tolerating, rather than avoiding, unpleasant emotions while accomplishing meaningful goals Identify major life conflicts from the past and present that form the basis for present anxiety Learn and implement behavioral strategies Verbalize an understanding and resolution of current interpersonal problems Learn and implement problem solving and decision making skills Learn and implement conflict resolution skills to resolve interpersonal problems Verbalize an understanding of healthy and unhealthy emotions verbalize insight into how past relationships may be influence current experiences with depression Use mindfulness and acceptance strategies and increase value based behavior  Increase hopeful statements about the future.   Interventions Teach about stress and ways to handle stress Assist the patient in developing a coping action plan for stressors Conduct skills based training for coping strategies Train problem focused skills Sort out what activities the individual can do Encourage patient to rely upon his/her spiritual faith Teach the patient to use guided imagery Probe and evaluate family's ability to provide emotional support Allow family to share their fears Assist the patient in identifying, sorting  through, and verbalizing the various feelings generated by his/her medical condition Meet with family members  Ask patient list out limitations  Use stress inoculation training  Use Acceptance and Commitment Therapy to help client accept uncomfortable realities in order to accomplish value-consistent goals Reinforce the client's insight into the role of his/her past emotional pain and present anxiety  Discuss examples demonstrating that unrealistic worry overestimates the probability of threats and underestimate patient's ability  Assist the patient in analyzing his or her worries Help patient understand that avoidance is reinforcing  Behavioral activation help the client explore the relationship, nature of the dispute,  Help the client develop new interpersonal skills and relationships Conduct Problem so living therapy Teach conflict resolution skills Use a process-experiential approach Conduct TLDP Conduct ACT  The patient and clinician reviewed the treatment plan on 08/07/2021. The patient reevaluated the treatment plan on 07/10/2022. The patient approved of the treatment plan.   Conception Chancy, PsyD

## 2022-08-08 NOTE — Progress Notes (Signed)
Name: Tracey Huff   MRN: [id number]    DOB: [birth year]   Date:08/09/2022       Progress Note  Subjective  Chief Complaint  Chief Complaint  Patient presents with   Annual Exam    HPI  Patient presents for annual CPE.  Diet: tries to eat well balanced Exercise: walks a couple times a week for about 30 minutes, discussed increasing to 150 min a week  Sleep: 8 hours, sleeps well Last dental exam:two years ago Last eye exam: Jan 2024  Flowsheet Row [facility]  AUDIT-C Score 0      Depression: Phq 9 is  negative    08/09/2022    8:51 AM 07/26/2022   11:37 AM 05/10/2022    9:54 AM 04/30/2022    3:27 PM 02/13/2022   11:23 AM  Depression screen PHQ 2/9  Decreased Interest 0 0 0 0 0  Down, Depressed, Hopeless 0 0 0 0 0  PHQ - 2 Score 0 0 0 0 0  Altered sleeping   0 3 0  Tired, decreased energy   0 0 0  Change in appetite   0 0 0  Feeling bad or failure about yourself    0 0 0  Trouble concentrating   0 0 0  Moving slowly or fidgety/restless   0 0 0  Suicidal thoughts   0 0 0  PHQ-9 Score   0 3 0  Difficult doing work/chores   Not difficult at all  Not difficult at all   Hypertension: BP Readings from Last 3 Encounters:  08/09/22 120/78  07/26/22 128/64  05/10/22 130/82   Obesity: Wt Readings from Last 3 Encounters:  08/09/22 179 lb 9.6 oz (81.5 kg)  07/26/22 179 lb 11.2 oz (81.5 kg)  05/10/22 181 lb (82.1 kg)   BMI Readings from Last 3 Encounters:  08/09/22 35.08 kg/m  07/26/22 35.10 kg/m  05/10/22 35.35 kg/m     Vaccines:  HPV: up to at age 52 , ask insurance if age between 9027-45  Shingrix: 4650-64 yo and ask insurance if covered when patient above 52 yo Pneumonia:  educated and discussed with patient. Flu:  educated and discussed with patient.  Hep C Screening: 06/28/2020 STD testing and prevention (HIV/chl/gon/syphilis): 05/15/2021 Intimate partner violence:none Sexual History : not sexually  active Menstrual History/LMP/Abnormal Bleeding: hysterectomy Incontinence Symptoms: stress incontinence   Breast cancer:  - Last Mammogram: 03/26/2021, had bilateral mastectomy - BRCA gene screening: done  Osteoporosis: Discussed high calcium and vitamin D supplementation, weight bearing exercises  Cervical cancer screening: 02/02/2020, partial hysterectomy   Skin cancer: Discussed monitoring for atypical lesions  Colorectal cancer: 01/04/2022   Lung cancer:   Low Dose CT Chest recommended if Age 11-80 years, 20 pack-year currently smoking OR have quit w/in 15years. Patient does not qualify.   ECG: 03/30/2021  Advanced Care Planning: A voluntary discussion about advance care planning including the explanation and discussion of advance directives.  Discussed health care proxy and Living will, and the patient was able to identify a health care proxy as daughter.  Patient does not have a living will at present time. If patient does have living will, I have requested they bring this to the clinic to be scanned in to their chart.  Lipids: Lab Results  Component Value Date   CHOL 237 (H) 05/10/2022   CHOL 251 (H) 11/07/2021   CHOL 240 (H) 05/09/2021   Lab Results  Component Value Date   HDL 61 05/10/2022   HDL 54 11/07/2021   HDL 53 05/09/2021   Lab Results  Component Value Date   LDLCALC 153 (H) 05/10/2022   LDLCALC 168 (H) 11/07/2021   LDLCALC 169 (H) 05/09/2021   Lab Results  Component Value Date   TRIG 117 05/10/2022   TRIG 150 (H) 11/07/2021   TRIG 74 05/09/2021   Lab Results  Component Value Date   CHOLHDL 3.9 05/10/2022   CHOLHDL 4.6 11/07/2021   CHOLHDL 4.5 05/09/2021   Lab Results  Component Value Date   LDLDIRECT 180 (H) 11/08/2020    Glucose: Glucose  Date Value Ref Range Status  08/24/2014 100 (H) mg/dL Final    Comment:    16-10 NOTE: New Reference Range  07/12/14   05/12/2011 98 65 - 99 mg/dL Final  96/08/5407 98 65 - 99 mg/dL Final   Glucose,  Bld  Date Value Ref Range Status  05/10/2022 105 (H) 65 - 99 mg/dL Final    Comment:    .            Fasting reference interval . For someone without known diabetes, a glucose value between 100 and 125 mg/dL is consistent with prediabetes and should be confirmed with a follow-up test. .   11/07/2021 107 (H) 65 - 99 mg/dL Final    Comment:    .            Fasting reference interval . For someone without known diabetes, a glucose value between 100 and 125 mg/dL is consistent with prediabetes and should be confirmed with a follow-up test. .   05/16/2021 121 (H) 70 - 99 mg/dL Final    Comment:    Glucose reference range applies only to samples taken after fasting for at least 8 hours.   Glucose-Capillary  Date Value Ref Range Status  01/04/2022 124 (H) 70 - 99 mg/dL Final    Comment:    Glucose reference range applies only to samples taken after fasting for at least 8 hours.    Patient Active Problem List   Diagnosis Date Noted   Type 2 diabetes mellitus with hyperglycemia, with long-term current use of insulin 07/26/2022   Colon cancer screening    Persistent adjustment disorder with anxiety 12/20/2021   Breast cancer 05/15/2021   Genetic testing 05/10/2021   Family history of prostate cancer 04/17/2021   Allergic rhinitis 03/21/2021   Allergic rhinitis due to animal (cat) (dog) hair and dander 03/21/2021   Allergic rhinitis due to pollen 03/21/2021   Chronic allergic conjunctivitis 03/21/2021   Carcinoma of upper-outer quadrant of left breast in female, estrogen receptor positive 03/21/2021   High grade squamous intraepithelial cervical dysplasia 01/29/2019   CIN III (cervical intraepithelial neoplasia grade III) with severe dysplasia 08/11/2018   Atypical squamous cell changes of undetermined significance (ASCUS) on cervical cytology with positive high risk human papilloma virus (HPV) 06/29/2018   Prediabetes 06/24/2018   Class 1 obesity due to excess calories with  serious comorbidity and body mass index (BMI) of 33.0 to 33.9 in adult 05/20/2018   Essential hypertension 05/20/2018   Hyperlipidemia 05/20/2018   Asthma 05/20/2018   Anxiety in acute stress reaction 05/20/2018   Status post hysterectomy 11/15/2014    Past Surgical History:  Procedure Laterality Date   ABDOMINAL HYSTERECTOMY N/A 11/15/2014   Procedure: HYSTERECTOMY ABDOMINAL/BILATERAL SALPINGECTOMY ;  Surgeon: Conard Novak, MD;  Location: [facility] ORS;  Service: Gynecology;  Laterality: N/A;  BREAST BIOPSY Right    2019 negative   BREAST BIOPSY Left 03/09/2021   u/s biopsy, 12-1 o'clock, VENUS" clip-path pending   BREAST LUMPECTOMY,RADIO FREQ LOCALIZER,AXILLARY SENTINEL LYMPH NODE BIOPSY Left 03/28/2021   Procedure: BREAST LUMPECTOMY,RADIO FREQ LOCALIZER,AXILLARY SENTINEL LYMPH NODE BIOPSY;  Surgeon: Leafy Ro, MD;  Location: [facility] ORS;  Service: General;  Laterality: Left;   BREAST RECONSTRUCTION WITH PLACEMENT OF TISSUE EXPANDER AND FLEX HD (ACELLULAR HYDRATED DERMIS) Bilateral 05/15/2021   Procedure: BILATERAL BREAST RECONSTRUCTION WITH PLACEMENT OF TISSUE EXPANDER AND FLEX HD (ACELLULAR HYDRATED DERMIS);  Surgeon: Allena Napoleon, MD;  Location: [facility];  Service: Plastics;  Laterality: Bilateral;   CESAREAN SECTION  1990   COLONOSCOPY WITH PROPOFOL N/A 01/04/2022   Procedure: COLONOSCOPY WITH PROPOFOL;  Surgeon: Wyline Mood, MD;  Location: [facility];  Service: Gastroenterology;  Laterality: N/A;   COLPOSCOPY  07/28/2018   CYSTOSCOPY N/A 11/15/2014   Procedure: CYSTOSCOPY;  Surgeon: Conard Novak, MD;  Location: [facility];  Service: Gynecology;  Laterality: N/A;   DILATION AND CURETTAGE OF UTERUS     GANGLION CYST EXCISION Left    REMOVAL OF BILATERAL TISSUE EXPANDERS WITH PLACEMENT OF BILATERAL BREAST IMPLANTS Bilateral 10/02/2021   Procedure: REMOVAL OF BILATERAL TISSUE EXPANDERS WITH PLACEMENT OF BILATERAL BREAST IMPLANTS;  Surgeon: Allena Napoleon, MD;  Location:   SURGERY CENTER;  Service: Plastics;  Laterality: Bilateral;   TOTAL MASTECTOMY Bilateral 05/15/2021   Procedure: TOTAL MASTECTOMY, Bilateral Simple Mastectomy, skin sparing;  Surgeon: Leafy Ro, MD;  Location: [facility] ORS;  Service: General;  Laterality: Bilateral;  Provider requesting for 2 hours / 120 min for procedure.   UTERINE FIBROID SURGERY      Family History  Problem Relation Age of Onset   Congestive Heart Failure Mother    Diabetes Mother    Hypertension Mother    Diabetes Maternal Grandmother    Congestive Heart Failure Maternal Grandfather    Breast cancer Neg Hx     Social History   Socioeconomic History   Marital status: Married    Spouse name: Peyton Najjar   Number of children: 2   Years of education: Not on file   Highest education level: Not on file  Occupational History   Not on file  Tobacco Use   Smoking status: Never    Passive exposure: Never   Smokeless tobacco: Never  Vaping Use   Vaping Use: Never used  Substance and Sexual Activity   Alcohol use: Not Currently    Comment: occassional   Drug use: No   Sexual activity: Yes    Partners: Male    Birth control/protection: Surgical  Other Topics Concern   Not on file  Social History Narrative   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 Lives with husband.    Social Determinants of Health   Financial Resource Strain: Low Risk  (08/09/2022)   Overall Financial Resource Strain (CARDIA)    Difficulty of Paying Living Expenses: Not hard at all  Food Insecurity: No Food Insecurity (08/09/2022)   Hunger Vital Sign    Worried About Running Out of Food in the Last Year: Never true    Ran Out of Food in the Last Year: Never true  Transportation Needs: No Transportation Needs (08/09/2022)   PRAPARE - Administrator, Civil Service (Medical): No    Lack of Transportation (Non-Medical): No  Physical Activity: Insufficiently Active (08/09/2022)    Exercise  Vital Sign    Days of Exercise per Week: 2 days    Minutes of Exercise per Session: 30 min  Stress: No Stress Concern Present (08/09/2022)   [employer] of Occupational Health - Occupational Stress Questionnaire    Feeling of Stress : Only a little  Social Connections: Socially Integrated (08/09/2022)   Social Connection and Isolation Panel [NHANES]    Frequency of Communication with Friends and Family: More than three times a week    Frequency of Social Gatherings with Friends and Family: More than three times a week    Attends Religious Services: More than 4 times per year    Active Member of [employer] or Organizations: No    Attends Engineer, structural: More than 4 times per year    Marital Status: Married  Catering manager Violence: Not At Risk (08/09/2022)   Humiliation, Afraid, Rape, and Kick questionnaire    Fear of Current or Ex-Partner: No    Emotionally Abused: No    Physically Abused: No    Sexually Abused: No     Current Outpatient Medications:    albuterol (PROVENTIL HFA) 108 (90 Base) MCG/ACT inhaler, Inhale 1-2 puffs into the lungs every 4 (four) to 6 (six) hours as needed., Disp: 6.7 g, Rfl: 0   Ascorbic Acid (VITAMIN C) 100 MG tablet, Take 100 mg by mouth daily., Disp: , Rfl:    atorvastatin (LIPITOR) 10 MG tablet, Take 1 tablet (10 mg total) by mouth daily. Bedtime, Disp: 90 tablet, Rfl: 1   AUVI-Q 0.3 MG/0.3ML SOAJ injection, Inject 0.3 mg into the muscle as needed for anaphylaxis., Disp: , Rfl:    azelastine (ASTELIN) 0.1 % nasal spray, Place 1-2 sprays into both nostrils 2 (two) times daily., Disp: 30 mL, Rfl: 6   benzonatate (TESSALON) 100 MG capsule, Take 2 capsules (200 mg total) by mouth 2 (two) times daily as needed for cough., Disp: 20 capsule, Rfl: 0   diphenhydrAMINE (BENADRYL) 2 % cream, Apply 1 application topically 2 (two) times daily as needed for itching., Disp: , Rfl:    ELDERBERRY PO, Take 1 capsule by mouth daily., Disp: , Rfl:     ezetimibe (ZETIA) 10 MG tablet, Take 1 tablet (10 mg total) by mouth daily., Disp: 90 tablet, Rfl: 3   fluticasone (FLONASE) 50 MCG/ACT nasal spray, Place 1-2 sprays into both nostrils daily., Disp: 16 g, Rfl: 6   Fluticasone-Umeclidin-Vilant (TRELEGY ELLIPTA) 200-62.5-25 MCG/ACT AEPB, Inhale 1 puff into the lungs daily., Disp: 60 each, Rfl: 6   hydrocortisone cream 1 %, Apply 1 application topically daily as needed for itching., Disp: , Rfl:    hydrOXYzine (VISTARIL) 25 MG capsule, TAKE 1 CAPSULE (25 MG TOTAL) BY MOUTH AT BEDTIME AS NEEDED FOR UP TO 30 DOSES (DIFFICULTY SLEEPING)., Disp: 30 capsule, Rfl: 1   ipratropium-albuterol (DUONEB) 0.5-2.5 (3) MG/3ML SOLN, Take 3 mLs by nebulization every 6 (six) hours as needed (wheeze, SOB, cough variant asthma)., Disp: 360 mL, Rfl: 1   levocetirizine (XYZAL) 5 MG tablet, Take 1 tablet (5 mg total) by mouth every evening., Disp: 30 tablet, Rfl: 6   losartan (COZAAR) 25 MG tablet, Take 1 tablet (25 mg total) by mouth every morning., Disp: 90 tablet, Rfl: 0   montelukast (SINGULAIR) 10 MG tablet, Take 1 tablet (10 mg total) by mouth every evening., Disp: 30 tablet, Rfl: 6   Semaglutide,0.25 or 0.5MG /DOS, (OZEMPIC, 0.25 OR 0.5 MG/DOSE,) 2 MG/3ML SOPN, Inject 0.25 mg into the skin once a week., Disp:  3 mL, Rfl: 0   tamoxifen (NOLVADEX) 20 MG tablet, Take 1 tablet (20 mg total) by mouth daily., Disp: 90 tablet, Rfl: 1   zolpidem (AMBIEN) 5 MG tablet, Take 1 tablet (5 mg total) by mouth at bedtime as needed for sleep., Disp: 30 tablet, Rfl: 0  Allergies  Allergen Reactions   Macadamia Nut Oil Shortness Of Breath   Apple Juice Nausea And Vomiting   Estonia Nut (Berthollefia Excelsa) Nausea And Vomiting and Swelling    Tongue swelling   Carrot Oil Nausea And Vomiting    Can not cooked raw carrots   Fruit & Vegetable Daily [Nutritional Supplements] Nausea And Vomiting    Cannot tolerate apples, peaches, plums, and nectarines   Kiwi Extract Nausea And Vomiting    Peach [Prunus Persica] Nausea And Vomiting     ROS  Constitutional: Negative for fever or weight change.  Respiratory: Negative for cough and shortness of breath.   Cardiovascular: Negative for chest pain or palpitations.  Gastrointestinal: Negative for abdominal pain, no bowel changes.  Musculoskeletal: Negative for gait problem or joint swelling.  Skin: Negative for rash.  Neurological: Negative for dizziness or headache.  No other specific complaints in a complete review of systems (except as listed in HPI above).   Objective  Vitals:   08/09/22 0849  BP: 120/78  Pulse: 96  Resp: 16  Temp: 98.3 F (36.8 C)  TempSrc: Oral  SpO2: 98%  Weight: 179 lb 9.6 oz (81.5 kg)  Height: 5' (1.524 m)    Body mass index is 35.08 kg/m.  Physical Exam Constitutional: Patient appears well-developed and well-nourished. No distress.  HENT: Head: Normocephalic and atraumatic. Ears: B TMs ok, no erythema or effusion; Nose: Nose normal. Mouth/Throat: Oropharynx is clear and moist. No oropharyngeal exudate.  Eyes: Conjunctivae and EOM are normal. Pupils are equal, round, and reactive to light. No scleral icterus.  Neck: Normal range of motion. Neck supple. No JVD present. No thyromegaly present.  Cardiovascular: Normal rate, regular rhythm and normal heart sounds.  No murmur heard. No BLE edema. Pulmonary/Chest: Effort normal and breath sounds normal. No respiratory distress. Abdominal: Soft. Bowel sounds are normal, no distension. There is no tenderness. no masses Breast:deferred, bilateral mastectomy FEMALE GENITALIA:  External genitalia normal External urethra normal Vaginal vault normal without discharge or lesions Cervix normal without discharge or lesions Bimanual exam normal without masses RECTAL: no rectal masses or hemorrhoids Musculoskeletal: Normal range of motion, no joint effusions. No gross deformities Neurological: he is alert and oriented to person, place, and time. No  cranial nerve deficit. Coordination, balance, strength, speech and gait are normal.  Skin: Skin is warm and dry. No rash noted. No erythema.  Psychiatric: Patient has a normal mood and affect. behavior is normal. Judgment and thought content normal.   No results found for this or any previous visit (from the past 2160 hour(s)).   Diabetic Foot Exam: Diabetic Foot Exam - Simple   Simple Foot Form Diabetic Foot exam was performed with the following findings: Yes 08/09/2022  9:07 AM  Visual Inspection No deformities, no ulcerations, no other skin breakdown bilaterally: Yes Sensation Testing Intact to touch and monofilament testing bilaterally: Yes Pulse Check Posterior Tibialis and Dorsalis pulse intact bilaterally: Yes Comments      Fall Risk:    08/09/2022    8:51 AM 07/26/2022   11:37 AM 05/10/2022    9:54 AM 04/30/2022    3:26 PM 02/13/2022   11:23 AM  Fall Risk  Falls in the past year? 0 0 0 0 0  Number falls in past yr: 0 0 0 0 0  Injury with Fall? 0 0 0 0 0  Risk for fall due to :    No Fall Risks No Fall Risks  Follow up   Falls evaluation completed Falls prevention discussed Falls prevention discussed;Education provided     Functional Status Survey: Is the patient deaf or have difficulty hearing?: No Does the patient have difficulty seeing, even when wearing glasses/contacts?: No Does the patient have difficulty concentrating, remembering, or making decisions?: No Does the patient have difficulty walking or climbing stairs?: No Does the patient have difficulty dressing or bathing?: No Does the patient have difficulty doing errands alone such as visiting a doctor's office or shopping?: No   Assessment & Plan  1. Annual physical exam  - Microalbumin / creatinine urine ratio - HM Diabetes Foot Exam - Cytology - PAP  2. Type 2 diabetes mellitus with hyperglycemia, with long-term current use of insulin  - Microalbumin / creatinine urine ratio - HM Diabetes Foot  Exam  3. Cervical cancer screening  - Cytology - PAP  4. Mouth sore  - RPR - Herpes simplex virus culture - mupirocin ointment (BACTROBAN) 2 %; Apply 1 Application topically 2 (two) times daily.  Dispense: 22 g; Refill: 0   -USPSTF grade A and B recommendations reviewed with patient; age-appropriate recommendations, preventive care, screening tests, etc discussed and encouraged; healthy living encouraged; see AVS for patient education given to patient -Discussed importance of 150 minutes of physical activity weekly, eat two servings of fish weekly, eat one serving of tree nuts ( cashews, pistachios, pecans, almonds.Marland Kitchen) every other day, eat 6 servings of fruit/vegetables daily and drink plenty of water and avoid sweet beverages.

## 2022-08-09 ENCOUNTER — Ambulatory Visit (INDEPENDENT_AMBULATORY_CARE_PROVIDER_SITE_OTHER): Payer: Commercial Managed Care - PPO | Admitting: Nurse Practitioner

## 2022-08-09 ENCOUNTER — Other Ambulatory Visit: Payer: Self-pay

## 2022-08-09 ENCOUNTER — Other Ambulatory Visit (HOSPITAL_COMMUNITY)
Admission: RE | Admit: 2022-08-09 | Discharge: 2022-08-09 | Disposition: A | Discharge status: Home / Self Care | Payer: Commercial Managed Care - PPO | Source: Ambulatory Visit | Attending: Nurse Practitioner | Admitting: Nurse Practitioner

## 2022-08-09 ENCOUNTER — Encounter: Payer: Self-pay | Admitting: Nurse Practitioner

## 2022-08-09 VITALS — BP 120/78 | HR 96 | Temp 98.3°F | Resp 16 | Ht 60.0 in | Wt 179.6 lb

## 2022-08-09 DIAGNOSIS — Z0000 Encounter for general adult medical examination without abnormal findings: Secondary | ICD-10-CM

## 2022-08-09 DIAGNOSIS — K1379 Other lesions of oral mucosa: Secondary | ICD-10-CM | POA: Diagnosis not present

## 2022-08-09 DIAGNOSIS — E1165 Type 2 diabetes mellitus with hyperglycemia: Secondary | ICD-10-CM | POA: Diagnosis not present

## 2022-08-09 DIAGNOSIS — Z794 Long term (current) use of insulin: Secondary | ICD-10-CM | POA: Diagnosis not present

## 2022-08-09 DIAGNOSIS — Z124 Encounter for screening for malignant neoplasm of cervix: Secondary | ICD-10-CM | POA: Insufficient documentation

## 2022-08-09 DIAGNOSIS — I10 Essential (primary) hypertension: Secondary | ICD-10-CM

## 2022-08-09 MED ORDER — MUPIROCIN 2 % EX OINT: 1.0000 | TOPICAL_OINTMENT | Freq: Two times a day (BID) | CUTANEOUS | 0 refills | Status: AC

## 2022-08-12 LAB — CYTOLOGY - PAP
Chlamydia: NEGATIVE
Comment: NEGATIVE
Comment: NEGATIVE
Comment: NORMAL
Diagnosis: NEGATIVE
High risk HPV: NEGATIVE
Neisseria Gonorrhea: NEGATIVE

## 2022-08-13 LAB — RPR: RPR Ser Ql: NONREACTIVE

## 2022-08-13 LAB — HERPES SIMPLEX VIRUS CULTURE
MICRO NUMBER:: 14789857
SPECIMEN QUALITY:: ADEQUATE

## 2022-08-13 LAB — MICROALBUMIN / CREATININE URINE RATIO
Creatinine, Urine: 205 mg/dL (ref 20–275)
Microalb Creat Ratio: 9 mg/g creat (ref <30)
Microalb, Ur: 1.9 mg/dL

## 2022-08-14 ENCOUNTER — Ambulatory Visit (INDEPENDENT_AMBULATORY_CARE_PROVIDER_SITE_OTHER): Payer: Commercial Managed Care - PPO | Admitting: Dermatology

## 2022-08-14 VITALS — BP 151/89 | HR 92

## 2022-08-14 DIAGNOSIS — L820 Inflamed seborrheic keratosis: Secondary | ICD-10-CM | POA: Diagnosis not present

## 2022-08-14 DIAGNOSIS — L821 Other seborrheic keratosis: Secondary | ICD-10-CM

## 2022-08-14 NOTE — Patient Instructions (Addendum)
Wound Care Instructions  Cleanse wound gently with soap and water once a day then pat dry with clean gauze. Apply a thin coat of Petrolatum (petroleum jelly, "Vaseline") over the wound (unless you have an allergy to this). We recommend that you use a new, sterile tube of Vaseline. Do not pick or remove scabs. Do not remove the yellow or white "healing tissue" from the base of the wound.  Cover the wound with fresh, clean, nonstick gauze and secure with paper tape. You may use Band-Aids in place of gauze and tape if the wound is small enough, but would recommend trimming much of the tape off as there is often too much. Sometimes Band-Aids can irritate the skin.  You should call the office for your biopsy report after 1 week if you have not already been contacted.  If you experience any problems, such as abnormal amounts of bleeding, swelling, significant bruising, significant pain, or evidence of infection, please call the office immediately.  FOR ADULT SURGERY PATIENTS: If you need something for pain relief you may take 1 extra strength Tylenol (acetaminophen) AND 2 Ibuprofen (200mg each) together every 4 hours as needed for pain. (do not take these if you are allergic to them or if you have a reason you should not take them.) Typically, you may only need pain medication for 1 to 3 days.     Due to recent changes in healthcare laws, you may see results of your pathology and/or laboratory studies on MyChart before the doctors have had a chance to review them. We understand that in some cases there may be results that are confusing or concerning to you. Please understand that not all results are received at the same time and often the doctors may need to interpret multiple results in order to provide you with the best plan of care or course of treatment. Therefore, we ask that you please give us 2 business days to thoroughly review all your results before contacting the office for clarification. Should  we see a critical lab result, you will be contacted sooner.   If You Need Anything After Your Visit  If you have any questions or concerns for your doctor, please call our main line at [phone] and press option 4 to reach your doctor's medical assistant. If no one answers, please leave a voicemail as directed and we will return your call as soon as possible. Messages left after 4 pm will be answered the following business day.   You may also send us a message via MyChart. We typically respond to MyChart messages within 1-2 business days.  For prescription refills, please ask your pharmacy to contact our office. Our fax number is [phone].  If you have an urgent issue when the clinic is closed that cannot wait until the next business day, you can page your doctor at the number below.    Please note that while we do our best to be available for urgent issues outside of office hours, we are not available 24/7.   If you have an urgent issue and are unable to reach us, you may choose to seek medical care at your doctor's office, retail clinic, urgent care center, or emergency room.  If you have a medical emergency, please immediately call 911 or go to the emergency department.  Pager Numbers  - Dr. Kowalski: [phone]  - Dr. Moye: [phone]  - Dr. Stewart: [phone]  In the event of inclement weather, please call our main line at   [phone] for an update on the status of any delays or closures.  Dermatology Medication Tips: Please keep the boxes that topical medications come in in order to help keep track of the instructions about where and how to use these. Pharmacies typically print the medication instructions only on the boxes and not directly on the medication tubes.   If your medication is too expensive, please contact our office at [phone] option 4 or send us a message through MyChart.   We are unable to tell what your co-pay for medications will be in  advance as this is different depending on your insurance coverage. However, we may be able to find a substitute medication at lower cost or fill out paperwork to get insurance to cover a needed medication.   If a prior authorization is required to get your medication covered by your insurance company, please allow us 1-2 business days to complete this process.  Drug prices often vary depending on where the prescription is filled and some pharmacies may offer cheaper prices.  The website www.goodrx.com contains coupons for medications through different pharmacies. The prices here do not account for what the cost may be with help from insurance (it may be cheaper with your insurance), but the website can give you the price if you did not use any insurance.  - You can print the associated coupon and take it with your prescription to the pharmacy.  - You may also stop by our office during regular business hours and pick up a GoodRx coupon card.  - If you need your prescription sent electronically to a different pharmacy, notify our office through [facility] MyChart or by phone at [phone] option 4.     Si Usted Necesita Algo Despus de Su Visita  Tambin puede enviarnos un mensaje a travs de MyChart. Por lo general respondemos a los mensajes de MyChart en el transcurso de 1 a 2 das hbiles.  Para renovar recetas, por favor pida a su farmacia que se ponga en contacto con nuestra oficina. Nuestro nmero de fax es el [phone].  Si tiene un asunto urgente cuando la clnica est cerrada y que no puede esperar hasta el siguiente da hbil, puede llamar/localizar a su doctor(a) al nmero que aparece a continuacin.   Por favor, tenga en cuenta que aunque hacemos todo lo posible para estar disponibles para asuntos urgentes fuera del horario de oficina, no estamos disponibles las 24 horas del da, los 7 das de la semana.   Si tiene un problema urgente y no puede comunicarse con nosotros, puede  optar por buscar atencin mdica  en el consultorio de su doctor(a), en una clnica privada, en un centro de atencin urgente o en una sala de emergencias.  Si tiene una emergencia mdica, por favor llame inmediatamente al 911 o vaya a la sala de emergencias.  Nmeros de bper  - Dr. Kowalski: [phone]  - Dra. Moye: [phone]  - Dra. Stewart: [phone]  En caso de inclemencias del tiempo, por favor llame a nuestra lnea principal al [phone] para una actualizacin sobre el estado de cualquier retraso o cierre.  Consejos para la medicacin en dermatologa: Por favor, guarde las cajas en las que vienen los medicamentos de uso tpico para ayudarle a seguir las instrucciones sobre dnde y cmo usarlos. Las farmacias generalmente imprimen las instrucciones del medicamento slo en las cajas y no directamente en los tubos del medicamento.   Si su medicamento es muy caro, por favor, pngase en contacto con   nuestra oficina llamando al [phone] y presione la opcin 4 o envenos un mensaje a travs de MyChart.   No podemos decirle cul ser su copago por los medicamentos por adelantado ya que esto es diferente dependiendo de la cobertura de su seguro. Sin embargo, es posible que podamos encontrar un medicamento sustituto a menor costo o llenar un formulario para que el seguro cubra el medicamento que se considera necesario.   Si se requiere una autorizacin previa para que su compaa de seguros cubra su medicamento, por favor permtanos de 1 a 2 das hbiles para completar este proceso.  Los precios de los medicamentos varan con frecuencia dependiendo del lugar de dnde se surte la receta y alguna farmacias pueden ofrecer precios ms baratos.  El sitio web www.goodrx.com tiene cupones para medicamentos de diferentes farmacias. Los precios aqu no tienen en cuenta lo que podra costar con la ayuda del seguro (puede ser ms barato con su seguro), pero el sitio web puede darle el  precio si no utiliz ningn seguro.  - Puede imprimir el cupn correspondiente y llevarlo con su receta a la farmacia.  - Tambin puede pasar por nuestra oficina durante el horario de atencin regular y recoger una tarjeta de cupones de GoodRx.  - Si necesita que su receta se enve electrnicamente a una farmacia diferente, informe a nuestra oficina a travs de MyChart de [facility] o por telfono llamando al [phone] y presione la opcin 4.  

## 2022-08-14 NOTE — Progress Notes (Signed)
   Follow-Up Visit   Subjective  Tracey Huff is a 52 y.o. female who presents for the following: 3 months f/u. The patient has lesions on her face to be evaluated, some may be new or changing, patient want to remove lesions on her right side of face today  The patient has spots, moles and lesions to be evaluated, some may be new or changing and the patient has concerns that these could be cancer.  The following portions of the chart were reviewed this encounter and updated as appropriate: medications, allergies, medical history  Review of Systems:  No other skin or systemic complaints except as noted in HPI or Assessment and Plan.  Objective  Well appearing patient in no apparent distress; mood and affect are within normal limits. A focused examination was performed of the following areas:face Relevant exam findings are noted in the Assessment and Plan.   Assessment & Plan   INFLAMED SEBORRHEIC KERATOSIS Exam: Erythematous keratotic or waxy stuck-on papules on the right cheek, right neck   Symptomatic, irritating, patient would like treated.  Benign-appearing.  Call clinic for new or changing lesions.   Procedure Note: - Prior to the procedure, reviewed the expected small wound. Also reviewed the risk of leaving a small scar and the small risk of infection.  PROCEDURE - The areas were prepped with isopropyl alcohol. A small amount of lidocaine 1% with epinephrine was injected at the base of each lesion to achieve good local anesthesia. The lesions  were removed using a snip technique. Aluminum chloride was used for hemostasis. Petrolatum and a bandage were applied. The procedure was tolerated well. - Wound care was reviewed with the patient. They were advised to call with any concerns.  - Locations: : right cheek x 15, right neck x 3 - Total number of treated: 18    INFLAMED SEBORRHEIC KERATOSIS Exam: Erythematous keratotic or waxy stuck-on the right eyelid margin   .Prior  to the procedure, reviewed the expected small wound. Also reviewed the risk of leaving a small scar and the small risk of infection.  PROCEDURE - The areas were prepped with isopropyl alcohol. A small amount of lidocaine 1% with epinephrine was injected at the base of each lesion to achieve good local anesthesia. The lesion was removed using a snip technique. Aluminum chloride was used for hemostasis. The procedure was tolerated well. - Wound care was reviewed with the patient. They were advised to call with any concerns.  - Locations: : right eyelid margin - Total number of treated: 1     SEBORRHEIC KERATOSIS - Stuck-on, waxy, tan-brown papules and/or plaques  - Benign-appearing - Discussed benign etiology and prognosis. - Observe - Call for any changes  Return in about 3 months (around 11/13/2022) for ISK .  I, Angelique Holm, CMA, am acting as scribe for Armida Sans, MD .   Documentation: I have reviewed the above documentation for accuracy and completeness, and I agree with the above.  Armida Sans, MD

## 2022-08-23 DIAGNOSIS — F411 Generalized anxiety disorder: Secondary | ICD-10-CM | POA: Diagnosis not present

## 2022-08-28 ENCOUNTER — Ambulatory Visit: Payer: Self-pay

## 2022-08-28 NOTE — Telephone Encounter (Signed)
  Chief Complaint: lip pain Symptoms: lip burning and discolored, darker than normal, has area on top lip that drains at times but no blister present, feels puffy at times Frequency: ongoing since had OV on 08/09/22 Pertinent Negatives: NA Disposition: ED /[] Urgent Care (no appt availability in office) / Appointment(In office/virtual)/  [facility]/ Home Care/ Refused Recommended Disposition /[] [facility]/  Follow-up with PCP Additional Notes: pt states she had testing done when she had OV but everything came back negative and still having issue with lips. Unsure if allergic reaction or cold sore or what. When she uses Carmex it burns, can apply Aquaphor and doesn't bother but the area that drains will crust over at times. Pt requested VV today but none available. Scheduled VV tomorrow at 0940 with Sheliah Mends, PA d/t pt having meeting when PCP VV was available. Advised pt she could try Blistex on area that is draining as well.   Summary: lips are now dark and discolored.   Pt stated at her last appointment she had some testing done because she had some issues with her lips. She stated that all her tests were normal; however, her lips are now dark and discolored. If she eats anything spicy or puts on something like carmex her lips burn.  Pt seeking clinical advice.     Reason for Disposition  [1] MILD-MODERATE mouth pain AND [2] present > 3 days  Answer Assessment - Initial Assessment Questions 1. ONSET: "When did the mouth start hurting?" (e.g., hours or days ago)      Ongoing since seen PCP on 08/09/22 2. SEVERITY: "How bad is the pain?" (Scale 1-10; mild, moderate or severe)   - MILD (1-3):  doesn't interfere with eating or normal activities   - MODERATE (4-7): interferes with eating some solids and normal activities   - SEVERE (8-10):  excruciating pain, interferes with most normal activities   - SEVERE DYSPHAGIA: can't swallow liquids, drooling     Mild to  moderate 3. SORES: "Are there any sores or ulcers in the mouth?" If Yes, ask: "What part of the mouth are the sores in?"     1 on top lip but doesn't look like blister  6. OTHER SYMPTOMS: "Do you have any other symptoms?" (e.g., difficulty breathing)     Lips discolored, darker than normal, and feels puffy at times  Protocols used: Mouth Pain-A-AH

## 2022-08-29 ENCOUNTER — Ambulatory Visit: Payer: Commercial Managed Care - PPO | Admitting: Nurse Practitioner

## 2022-08-29 ENCOUNTER — Telehealth (INDEPENDENT_AMBULATORY_CARE_PROVIDER_SITE_OTHER): Payer: Self-pay | Admitting: Family Medicine

## 2022-08-29 ENCOUNTER — Telehealth: Payer: Commercial Managed Care - PPO | Admitting: Nurse Practitioner

## 2022-08-29 ENCOUNTER — Encounter: Payer: Self-pay | Admitting: Nurse Practitioner

## 2022-08-29 ENCOUNTER — Other Ambulatory Visit: Payer: Self-pay

## 2022-08-29 ENCOUNTER — Other Ambulatory Visit (HOSPITAL_COMMUNITY): Payer: Self-pay

## 2022-08-29 VITALS — BP 124/82 | HR 98 | Temp 98.2°F | Resp 16 | Ht 60.0 in | Wt 178.0 lb

## 2022-08-29 DIAGNOSIS — H1045 Other chronic allergic conjunctivitis: Secondary | ICD-10-CM | POA: Diagnosis not present

## 2022-08-29 DIAGNOSIS — K130 Diseases of lips: Secondary | ICD-10-CM

## 2022-08-29 DIAGNOSIS — J3089 Other allergic rhinitis: Secondary | ICD-10-CM | POA: Diagnosis not present

## 2022-08-29 DIAGNOSIS — R21 Rash and other nonspecific skin eruption: Secondary | ICD-10-CM

## 2022-08-29 DIAGNOSIS — J4530 Mild persistent asthma, uncomplicated: Secondary | ICD-10-CM | POA: Diagnosis not present

## 2022-08-29 DIAGNOSIS — J301 Allergic rhinitis due to pollen: Secondary | ICD-10-CM | POA: Diagnosis not present

## 2022-08-29 MED ORDER — ACYCLOVIR 400 MG PO TABS
400.0000 mg | ORAL_TABLET | Freq: Three times a day (TID) | ORAL | 0 refills | Status: DC
Start: 2022-08-29 — End: 2022-08-29
  Filled 2022-08-29: qty 21, 7d supply, fill #0

## 2022-08-29 MED ORDER — FLUTICASONE PROPIONATE 50 MCG/ACT NA SUSP
1.0000 | Freq: Every day | NASAL | 6 refills | Status: DC
  Filled 2022-08-29: qty 16, 30d supply, fill #0

## 2022-08-29 MED ORDER — AZELASTINE HCL 0.1 % NA SOLN
1.0000 | Freq: Two times a day (BID) | NASAL | 6 refills | Status: DC
  Filled 2022-08-29: qty 30, 25d supply, fill #0

## 2022-08-29 MED ORDER — OLOPATADINE HCL 0.2 % OP SOLN
1.0000 [drp] | Freq: Every day | OPHTHALMIC | 6 refills | Status: AC
  Filled 2022-08-29: qty 2.5, 50d supply, fill #0

## 2022-08-29 MED ORDER — LEVOCETIRIZINE DIHYDROCHLORIDE 5 MG PO TABS
5.0000 mg | ORAL_TABLET | Freq: Every evening | ORAL | 6 refills | Status: DC
  Filled 2022-08-29: qty 30, 30d supply, fill #0

## 2022-08-29 MED ORDER — MONTELUKAST SODIUM 10 MG PO TABS
10.0000 mg | ORAL_TABLET | Freq: Every evening | ORAL | 6 refills | Status: DC
  Filled 2022-08-29: qty 30, 30d supply, fill #0

## 2022-08-29 MED ORDER — ACYCLOVIR 400 MG PO TABS
400.0000 mg | ORAL_TABLET | Freq: Three times a day (TID) | ORAL | 0 refills | Status: AC
Start: 2022-08-29 — End: 2022-09-05

## 2022-08-29 MED ORDER — ALBUTEROL SULFATE HFA 108 (90 BASE) MCG/ACT IN AERS
1.0000 | INHALATION_SPRAY | RESPIRATORY_TRACT | 0 refills | Status: DC
  Filled 2022-08-29: qty 6.7, 30d supply, fill #0

## 2022-08-29 NOTE — Progress Notes (Signed)
Name: Tracey Huff   MRN: [id number]    DOB: 1970/06/03   Date:08/29/2022       Progress Note  Subjective:    Chief Complaint  Chief Complaint  Patient presents with   Lip discoloration    x3 weeks, itching, pain w/touch.    I connected with  Wenda Overland  on 08/29/22 at  9:40 AM EDT by a video enabled telemedicine application  Video on pt's end and audio were not working but she was able to use the messaging function   Asked pt to send in pictures of lip changes and I can try and work with PCP on what we may possibly be able to do next- but she may need to reschedule or get in-person for proper evaluation   and verified that I am speaking with the correct person using two identifiers.  I discussed the limitations of evaluation and management by telemedicine and the availability of in person appointments. The patient expressed understanding and agreed to proceed. Staff also discussed with the patient that there may be a patient responsible charge related to this service. Patient Location:  pt at work  Provider Location: [facility] clinic office Additional Individuals present: none  HPI  Nurse call and documentation: pt states she had testing done when she had OV but everything came back negative and still having issue with lips. Unsure if allergic reaction or cold sore or what. When she uses Carmex it burns, can apply Aquaphor and doesn't bother but the area that drains will crust over at times. Pt requested VV today but none available. Scheduled VV tomorrow at 0940 with Sheliah Mends, PA d/t pt having meeting when PCP VV was available. Advised pt she could try Blistex on area that is draining as well.       Summary: lips are now dark and discolored.    Pt stated at her last appointment she had some testing done because she had some issues with her lips. She stated that all her tests were normal; however, her lips are now dark and discolored. If she eats anything spicy or puts on something  like carmex her lips burn.  Pt seeking clinical advice.     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 and treated with mupirocin    Patient Active Problem List   Diagnosis Date Noted   Type 2 diabetes mellitus with hyperglycemia, with long-term current use of insulin 07/26/2022   Colon cancer screening    Persistent adjustment disorder with anxiety 12/20/2021   Breast cancer 05/15/2021   Genetic testing 05/10/2021   Family history of prostate cancer 04/17/2021   Allergic rhinitis 03/21/2021   Allergic rhinitis due to animal (cat) (dog) hair and dander 03/21/2021   Allergic rhinitis due to pollen 03/21/2021   Chronic allergic conjunctivitis 03/21/2021   Carcinoma of upper-outer quadrant of left breast in female, estrogen receptor positive 03/21/2021   High grade squamous intraepithelial cervical dysplasia 01/29/2019   CIN III (cervical intraepithelial neoplasia grade III) with severe dysplasia 08/11/2018   Atypical squamous cell changes of undetermined significance (ASCUS) on cervical cytology with positive high risk human papilloma virus (HPV) 06/29/2018   Prediabetes 06/24/2018   Class 1 obesity due to excess calories with serious comorbidity and body mass index (BMI) of 33.0 to 33.9 in adult 05/20/2018   Essential hypertension 05/20/2018   Hyperlipidemia 05/20/2018   Asthma 05/20/2018   Anxiety in acute stress reaction 05/20/2018   Status post hysterectomy 11/15/2014  Social History   Tobacco Use   Smoking status: Never    Passive exposure: Never   Smokeless tobacco: Never  Substance Use Topics   Alcohol use: Not Currently    Comment: occassional     Current Outpatient Medications:    albuterol (PROVENTIL HFA) 108 (90 Base) MCG/ACT inhaler, Inhale 1-2 puffs into the lungs every 4 (four) to 6 (six) hours as needed., Disp: 6.7 g, Rfl: 0   Ascorbic Acid (VITAMIN C) 100 MG tablet, Take 100 mg by mouth daily., Disp: , Rfl:    atorvastatin (LIPITOR) 10 MG tablet,  Take 1 tablet (10 mg total) by mouth daily. Bedtime, Disp: 90 tablet, Rfl: 1   AUVI-Q 0.3 MG/0.3ML SOAJ injection, Inject 0.3 mg into the muscle as needed for anaphylaxis., Disp: , Rfl:    azelastine (ASTELIN) 0.1 % nasal spray, Place 1-2 sprays into both nostrils 2 (two) times daily., Disp: 30 mL, Rfl: 6   benzonatate (TESSALON) 100 MG capsule, Take 2 capsules (200 mg total) by mouth 2 (two) times daily as needed for cough., Disp: 20 capsule, Rfl: 0   diphenhydrAMINE (BENADRYL) 2 % cream, Apply 1 application topically 2 (two) times daily as needed for itching., Disp: , Rfl:    ELDERBERRY PO, Take 1 capsule by mouth daily., Disp: , Rfl:    ezetimibe (ZETIA) 10 MG tablet, Take 1 tablet (10 mg total) by mouth daily., Disp: 90 tablet, Rfl: 3   fluticasone (FLONASE) 50 MCG/ACT nasal spray, Place 1-2 sprays into both nostrils daily., Disp: 16 g, Rfl: 6   Fluticasone-Umeclidin-Vilant (TRELEGY ELLIPTA) 200-62.5-25 MCG/ACT AEPB, Inhale 1 puff into the lungs daily., Disp: 60 each, Rfl: 6   hydrocortisone cream 1 %, Apply 1 application topically daily as needed for itching., Disp: , Rfl:    hydrOXYzine (VISTARIL) 25 MG capsule, TAKE 1 CAPSULE (25 MG TOTAL) BY MOUTH AT BEDTIME AS NEEDED FOR UP TO 30 DOSES (DIFFICULTY SLEEPING)., Disp: 30 capsule, Rfl: 1   ipratropium-albuterol (DUONEB) 0.5-2.5 (3) MG/3ML SOLN, Take 3 mLs by nebulization every 6 (six) hours as needed (wheeze, SOB, cough variant asthma)., Disp: 360 mL, Rfl: 1   levocetirizine (XYZAL) 5 MG tablet, Take 1 tablet (5 mg total) by mouth every evening., Disp: 30 tablet, Rfl: 6   losartan (COZAAR) 25 MG tablet, Take 1 tablet (25 mg total) by mouth every morning., Disp: 90 tablet, Rfl: 0   montelukast (SINGULAIR) 10 MG tablet, Take 1 tablet (10 mg total) by mouth every evening., Disp: 30 tablet, Rfl: 6   mupirocin ointment (BACTROBAN) 2 %, Apply 1 Application topically 2 (two) times daily., Disp: 22 g, Rfl: 0   Semaglutide,0.25 or 0.5MG /DOS, (OZEMPIC,  0.25 OR 0.5 MG/DOSE,) 2 MG/3ML SOPN, Inject 0.25 mg into the skin once a week., Disp: 3 mL, Rfl: 0   tamoxifen (NOLVADEX) 20 MG tablet, Take 1 tablet (20 mg total) by mouth daily., Disp: 90 tablet, Rfl: 1   zolpidem (AMBIEN) 5 MG tablet, Take 1 tablet (5 mg total) by mouth at bedtime as needed for sleep., Disp: 30 tablet, Rfl: 0  Allergies  Allergen Reactions   Macadamia Nut Oil Shortness Of Breath   Apple Juice Nausea And Vomiting   [place]ia Nut (Berthollefia Excelsa) Nausea And Vomiting and Swelling    Tongue swelling   Carrot Oil Nausea And Vomiting    Can not cooked raw carrots   Fruit & Vegetable Daily [Nutritional Supplements] Nausea And Vomiting    Cannot tolerate apples, peaches, plums, and nectarines  Kiwi Extract Nausea And Vomiting   Peach [Prunus Persica] Nausea And Vomiting      Review of Systems    Objective:   Virtual encounter, vitals limited, only able to obtain the following There were no vitals filed for this visit. There is no height or weight on file to calculate BMI. Nursing Note and Vital Signs reviewed.  Physical Exam  PE limited by virtual encounter  No results found for this or any previous visit (from the past 72 hour(s)).  Assessment and Plan:   No diagnosis found.   -Red flags and when to present for emergency care or RTC including fever >101.26F, chest pain, shortness of breath, new/worsening/un-resolving symptoms, reviewed with patient at time of visit. Follow up and care instructions discussed and provided in AVS. - I discussed the assessment and treatment plan with the patient. The patient was provided an opportunity to ask questions and all were answered. The patient agreed with the plan and demonstrated an understanding of the instructions.  No encounter completed today - see mychart encounter/msgs/photos for any A&P  Danelle Berry, PA-C 08/29/22 9:56 AM

## 2022-08-29 NOTE — Progress Notes (Signed)
BP 124/82   Pulse 98   Temp 98.2 F (36.8 C) (Oral)   Resp 16   Ht 5' (1.524 m)   Wt 178 lb (80.7 kg)   LMP 11/15/2014   SpO2 99%   BMI 34.76 kg/m    Subjective:    Patient ID: Tracey Huff, female    DOB: Mar 15, 1971, 52 y.o.   MRN: [id number]  HPI: Tracey Huff is a 52 y.o. female  Chief Complaint  Patient presents with   Follow-up    Lip lesion, discoloration   Lip lesion: saw patient on 08/09/2022.  She reported she had a lesion on her lip.  Tested her for RPR and HSV and both came back negative. Tried mupirocin ointment but no improvement. Will trial acyclovir and have her follow up with dermatology.   Relevant past medical, surgical, family and social history reviewed and updated as indicated. Interim medical history since our last visit reviewed. Allergies and medications reviewed and updated.  Review of Systems  Constitutional: Negative for fever or weight change.  Respiratory: Negative for cough and shortness of breath.   Cardiovascular: Negative for chest pain or palpitations.  Gastrointestinal: Negative for abdominal pain, no bowel changes.  Musculoskeletal: Negative for gait problem or joint swelling.  Skin: Negative for rash.  Neurological: Negative for dizziness or headache.  No other specific complaints in a complete review of systems (except as listed in HPI above).      Objective:    BP 124/82   Pulse 98   Temp 98.2 F (36.8 C) (Oral)   Resp 16   Ht 5' (1.524 m)   Wt 178 lb (80.7 kg)   LMP 11/15/2014   SpO2 99%   BMI 34.76 kg/m   Wt Readings from Last 3 Encounters:  08/29/22 178 lb (80.7 kg)  08/09/22 179 lb 9.6 oz (81.5 kg)  07/26/22 179 lb 11.2 oz (81.5 kg)    Physical Exam  Constitutional: Patient appears well-developed and well-nourished. Obese  No distress.  HEENT: head atraumatic, normocephalic, pupils equal and reactive to light,, neck supple, throat within normal limits, upper lip lesion Cardiovascular: Normal rate,  regular rhythm and normal heart sounds.  No murmur heard. No BLE edema. Pulmonary/Chest: Effort normal and breath sounds normal. No respiratory distress. Abdominal: Soft.  There is no tenderness. Psychiatric: Patient has a normal mood and affect. behavior is normal. Judgment and thought content normal.  Results for orders placed or performed in visit on 08/09/22  Herpes simplex virus culture  Result Value Ref Range   MICRO NUMBER: [id number]    SPECIMEN QUALITY: Adequate    Source MOUTH    STATUS: FINAL    HSV CULTURE: Not Isolated   RPR  Result Value Ref Range   RPR Ser Ql NON-REACTIVE NON-REACTIVE  Microalbumin / creatinine urine ratio  Result Value Ref Range   Creatinine, Urine 205 20 - 275 mg/dL   Microalb, Ur 1.9 mg/dL   Microalb Creat Ratio 9 <30 mg/g creat  Cytology - PAP  Result Value Ref Range   High risk HPV Negative    Neisseria Gonorrhea Negative    Chlamydia Negative    Adequacy Satisfactory for evaluation.    Diagnosis      - Negative for intraepithelial lesion or malignancy (NILM)   Comment Normal Reference Range HPV - Negative    Comment Normal Reference Ranger Chlamydia - Negative    Comment      Normal Reference Range Neisseria Gonorrhea - Negative  Assessment & Plan:   Problem List Items Addressed This Visit   None Visit Diagnoses     Lip lesion    -  Primary   start acyclovir, follow up with your dermatologist if no improvement   Relevant Medications   acyclovir (ZOVIRAX) 400 MG tablet        Follow up plan: Return if symptoms worsen or fail to improve.

## 2022-08-31 ENCOUNTER — Emergency Department
Admission: EM | Admit: 2022-08-31 | Discharge: 2022-08-31 | Disposition: A | Discharge status: Home / Self Care | Payer: Commercial Managed Care - PPO | Attending: Emergency Medicine | Admitting: Emergency Medicine

## 2022-08-31 ENCOUNTER — Ambulatory Visit
Admission: EM | Admit: 2022-08-31 | Discharge: 2022-08-31 | Disposition: A | Discharge status: Home / Self Care | Payer: Commercial Managed Care - PPO

## 2022-08-31 DIAGNOSIS — B001 Herpesviral vesicular dermatitis: Secondary | ICD-10-CM | POA: Diagnosis not present

## 2022-08-31 DIAGNOSIS — I10 Essential (primary) hypertension: Secondary | ICD-10-CM | POA: Insufficient documentation

## 2022-08-31 DIAGNOSIS — R220 Localized swelling, mass and lump, head: Secondary | ICD-10-CM

## 2022-08-31 DIAGNOSIS — Z853 Personal history of malignant neoplasm of breast: Secondary | ICD-10-CM | POA: Insufficient documentation

## 2022-08-31 DIAGNOSIS — J45909 Unspecified asthma, uncomplicated: Secondary | ICD-10-CM | POA: Insufficient documentation

## 2022-08-31 DIAGNOSIS — E119 Type 2 diabetes mellitus without complications: Secondary | ICD-10-CM | POA: Insufficient documentation

## 2022-08-31 DIAGNOSIS — L299 Pruritus, unspecified: Secondary | ICD-10-CM | POA: Diagnosis present

## 2022-08-31 DIAGNOSIS — B0089 Other herpesviral infection: Secondary | ICD-10-CM

## 2022-08-31 DIAGNOSIS — B009 Herpesviral infection, unspecified: Secondary | ICD-10-CM | POA: Diagnosis not present

## 2022-08-31 MED ORDER — DOCOSANOL 10 % EX CREA: 1.0000 | TOPICAL_CREAM | CUTANEOUS | 0 refills | Status: AC

## 2022-08-31 NOTE — ED Provider Notes (Addendum)
Renaldo Fiddler    CSN: [id number] Arrival date & time: 08/31/22  4098      History   Chief Complaint Chief Complaint  Patient presents with   Oral Swelling    HPI Tracey Huff is a 52 y.o. female.   HPI  Patient presents to urgent care with concern for lesion on her lip.  Patient states she was seen at her PCP and treated with mupirocin for presumed bacterial infection, Valtrex for presumed herpetic infection.  Also given acyclovir.  She endorses no improvement with treatment.  States she was instructed to follow-up with dermatology.  She denies any known contact with 1 of many allergens.  No new product use, no environmental contact.  Past Medical History:  Diagnosis Date   Allergy    Anxiety    Asthma    Breast cancer (HCC)    INVASIVE MAMMARY CARCINOMA- left   Family history of prostate cancer    Fibroid uterus 11/15/2014   Hyperlipidemia    Hypertension    Menorrhagia with irregular cycle 11/15/2014   Pre-diabetes     Patient Active Problem List   Diagnosis Date Noted   Type 2 diabetes mellitus with hyperglycemia, with long-term current use of insulin (HCC) 07/26/2022   Colon cancer screening    Persistent adjustment disorder with anxiety 12/20/2021   Breast cancer (HCC) 05/15/2021   Genetic testing 05/10/2021   Family history of prostate cancer 04/17/2021   Allergic rhinitis 03/21/2021   Allergic rhinitis due to animal (cat) (dog) hair and dander 03/21/2021   Allergic rhinitis due to pollen 03/21/2021   Chronic allergic conjunctivitis 03/21/2021   Carcinoma of upper-outer quadrant of left breast in female, estrogen receptor positive (HCC) 03/21/2021   High grade squamous intraepithelial cervical dysplasia 01/29/2019   CIN III (cervical intraepithelial neoplasia grade III) with severe dysplasia 08/11/2018   Atypical squamous cell changes of undetermined significance (ASCUS) on cervical cytology with positive high risk human papilloma virus (HPV)  06/29/2018   Prediabetes 06/24/2018   Class 1 obesity due to excess calories with serious comorbidity and body mass index (BMI) of 33.0 to 33.9 in adult 05/20/2018   Essential hypertension 05/20/2018   Hyperlipidemia 05/20/2018   Asthma 05/20/2018   Anxiety in acute stress reaction 05/20/2018   Status post hysterectomy 11/15/2014    Past Surgical History:  Procedure Laterality Date   ABDOMINAL HYSTERECTOMY N/A 11/15/2014   Procedure: HYSTERECTOMY ABDOMINAL/BILATERAL SALPINGECTOMY ;  Surgeon: Conard Novak, MD;  Location: [facility];  Service: Gynecology;  Laterality: N/A;   BREAST BIOPSY Right    2019 negative   BREAST BIOPSY Left 03/09/2021   u/s biopsy, 12-1 o'clock, VENUS" clip-path pending   BREAST LUMPECTOMY,RADIO FREQ LOCALIZER,AXILLARY SENTINEL LYMPH NODE BIOPSY Left 03/28/2021   Procedure: BREAST LUMPECTOMY,RADIO FREQ LOCALIZER,AXILLARY SENTINEL LYMPH NODE BIOPSY;  Surgeon: Leafy Ro, MD;  Location: [facility];  Service: General;  Laterality: Left;   BREAST RECONSTRUCTION WITH PLACEMENT OF TISSUE EXPANDER AND FLEX HD (ACELLULAR HYDRATED DERMIS) Bilateral 05/15/2021   Procedure: BILATERAL BREAST RECONSTRUCTION WITH PLACEMENT OF TISSUE EXPANDER AND FLEX HD (ACELLULAR HYDRATED DERMIS);  Surgeon: Allena Napoleon, MD;  Location: [facility];  Service: Plastics;  Laterality: Bilateral;   CESAREAN SECTION  1990   COLONOSCOPY WITH PROPOFOL N/A 01/04/2022   Procedure: COLONOSCOPY WITH PROPOFOL;  Surgeon: Wyline Mood, MD;  Location: [facility];  Service: Gastroenterology;  Laterality: N/A;   COLPOSCOPY  07/28/2018   CYSTOSCOPY N/A 11/15/2014   Procedure: CYSTOSCOPY;  Surgeon: Jeannett Senior  Leo Rod, MD;  Location: [facility];  Service: Gynecology;  Laterality: N/A;   DILATION AND CURETTAGE OF UTERUS     GANGLION CYST EXCISION Left    REMOVAL OF BILATERAL TISSUE EXPANDERS WITH PLACEMENT OF BILATERAL BREAST IMPLANTS Bilateral 10/02/2021   Procedure: REMOVAL OF BILATERAL TISSUE EXPANDERS WITH  PLACEMENT OF BILATERAL BREAST IMPLANTS;  Surgeon: Allena Napoleon, MD;  Location: [facility];  Service: Plastics;  Laterality: Bilateral;   TOTAL MASTECTOMY Bilateral 05/15/2021   Procedure: TOTAL MASTECTOMY, Bilateral Simple Mastectomy, skin sparing;  Surgeon: Leafy Ro, MD;  Location: [facility];  Service: General;  Laterality: Bilateral;  Provider requesting for 2 hours / 120 min for procedure.   UTERINE FIBROID SURGERY      OB History   No obstetric history on file.      Home Medications    Prior to Admission medications   Medication Sig Start Date End Date Taking? Authorizing Provider  acyclovir (ZOVIRAX) 400 MG tablet Take 1 tablet (400 mg total) by mouth 3 (three) times daily for 7 days. 08/29/22 09/05/22  Berniece Salines, FNP  albuterol (PROVENTIL HFA) 108 (90 Base) MCG/ACT inhaler Inhale 1-2 puffs into the lungs every 4 (four) to 6 (six) hours as needed. 03/18/22     albuterol (PROVENTIL HFA) 108 (90 Base) MCG/ACT inhaler Inhale 1-2 puffs into the lungs every 4-6 hours as needed for cough/wheeze 08/29/22     Ascorbic Acid (VITAMIN C) 100 MG tablet Take 100 mg by mouth daily.    [provider]  atorvastatin (LIPITOR) 10 MG tablet Take 1 tablet (10 mg total) by mouth daily. Bedtime 05/10/22   Berniece Salines, FNP  AUVI-Q 0.3 MG/0.3ML SOAJ injection Inject 0.3 mg into the muscle as needed for anaphylaxis. 02/22/21   [provider]  azelastine (ASTELIN) 0.1 % nasal spray Place 1-2 sprays into both nostrils 2 (two) times daily. 03/06/22     azelastine (ASTELIN) 0.1 % nasal spray Place 1-2 sprays into both nostrils 2 (two) times daily. 08/29/22     benzonatate (TESSALON) 100 MG capsule Take 2 capsules (200 mg total) by mouth 2 (two) times daily as needed for cough. Patient not taking: Reported on 08/29/2022 07/26/22   Berniece Salines, FNP  diphenhydrAMINE (BENADRYL) 2 % cream Apply 1 application topically 2 (two) times daily as needed for itching.    [provider]  ELDERBERRY PO Take 1 capsule by mouth daily.    [provider]  ezetimibe (ZETIA) 10 MG tablet Take 1 tablet (10 mg total) by mouth daily. 11/09/21   Berniece Salines, FNP  fluticasone (FLONASE) 50 MCG/ACT nasal spray Place 1-2 sprays into both nostrils daily. 03/06/22     fluticasone (FLONASE) 50 MCG/ACT nasal spray Place 1-2 sprays into both nostrils daily. Patient not taking: Reported on 08/29/2022 08/29/22     Fluticasone-Umeclidin-Vilant (TRELEGY ELLIPTA) 200-62.5-25 MCG/ACT AEPB Inhale 1 puff into the lungs daily. 03/18/22     hydrocortisone cream 1 % Apply 1 application topically daily as needed for itching.    [provider]  hydrOXYzine (VISTARIL) 25 MG capsule TAKE 1 CAPSULE (25 MG TOTAL) BY MOUTH AT BEDTIME AS NEEDED FOR UP TO 30 DOSES (DIFFICULTY SLEEPING). 08/27/21   Lorelee New, PA-C  ipratropium-albuterol (DUONEB) 0.5-2.5 (3) MG/3ML SOLN Take 3 mLs by nebulization every 6 (six) hours as needed (wheeze, SOB, cough variant asthma). 11/10/20   Danelle Berry, PA-C  levocetirizine (XYZAL) 5 MG tablet Take 1 tablet (  5 mg total) by mouth every evening. 03/01/21     levocetirizine (XYZAL) 5 MG tablet Take 1 tablet (5 mg total) by mouth every evening. 08/29/22     losartan (COZAAR) 25 MG tablet Take 1 tablet (25 mg total) by mouth every morning. 07/22/22   Margarita Mail, DO  montelukast (SINGULAIR) 10 MG tablet Take 1 tablet (10 mg total) by mouth every evening. 03/06/22     montelukast (SINGULAIR) 10 MG tablet Take 1 tablet (10 mg total) by mouth every evening. 08/29/22     mupirocin ointment (BACTROBAN) 2 % Apply 1 Application topically 2 (two) times daily. 08/09/22   Berniece Salines, FNP  Olopatadine HCl (PATADAY) 0.2 % SOLN Instill 1 drop into affected eye once daily 08/29/22     Semaglutide,0.25 or 0.5MG /DOS, (OZEMPIC, 0.25 OR 0.5 MG/DOSE,) 2 MG/3ML SOPN Inject 0.25 mg into the skin once a week. 07/26/22   Berniece Salines, FNP  tamoxifen (NOLVADEX) 20 MG  tablet Take 1 tablet (20 mg total) by mouth daily. 03/06/22   Earna Coder, MD  zolpidem (AMBIEN) 5 MG tablet Take 1 tablet (5 mg total) by mouth at bedtime as needed for sleep. 05/10/22   Berniece Salines, FNP    Family History Family History  Problem Relation Age of Onset   Congestive Heart Failure Mother    Diabetes Mother    Hypertension Mother    Diabetes Maternal Grandmother    Congestive Heart Failure Maternal Grandfather    Breast cancer Neg Hx     Social History Social History   Tobacco Use   Smoking status: Never    Passive exposure: Never   Smokeless tobacco: Never  Vaping Use   Vaping Use: Never used  Substance Use Topics   Alcohol use: Not Currently    Comment: occassional   Drug use: No     Allergies   Macadamia nut oil, Apple juice, [place] nut (berthollefia excelsa), Carrot oil, Fruit & vegetable daily [nutritional supplements], Kiwi extract, and Peach [prunus persica]   Review of Systems Review of Systems   Physical Exam Triage Vital Signs ED Triage Vitals  Enc Vitals Group     BP      Pulse      Resp      Temp      Temp src      SpO2      Weight      Height      Head Circumference      Peak Flow      Pain Score      Pain Loc      Pain Edu?      Excl. in GC?    No data found.  Updated Vital Signs BP (!) 156/69 (BP Location: Left Arm)   Pulse 78   Temp 99 F (37.2 C) (Oral)   Resp 16   LMP 11/15/2014   SpO2 98%   Visual Acuity Right Eye Distance:   Left Eye Distance:   Bilateral Distance:    Right Eye Near:   Left Eye Near:    Bilateral Near:     Physical Exam Vitals reviewed.  Constitutional:      Appearance: Normal appearance.  Skin:    General: Skin is warm and dry.  Neurological:     General: No focal deficit present.     Mental Status: She is alert and oriented to person, place, and time.  Psychiatric:        Mood  and Affect: Mood normal.        Behavior: Behavior normal.      UC Treatments /  Results  Labs (all labs ordered are listed, but only abnormal results are displayed) Labs Reviewed - No data to display  EKG   Radiology No results found.  Procedures Procedures (including critical care time)  Medications Ordered in UC Medications - No data to display  Initial Impression / Assessment and Plan / UC Course  I have reviewed the triage vital signs and the nursing notes.  Pertinent labs & imaging results that were available during my care of the patient were reviewed by me and considered in my medical decision making (see chart for details).   Tracey Huff is a 52 y.o. female presenting with lip swelling and dryness. Patient is afebrile without recent antipyretics, satting well on room air. Overall is well appearing and non-toxic, well hydrated, without respiratory distress.    Patient endorses swelling to her upper and lower lips that is uncomfortable.  She is only 2 days into treatment with acyclovir and apparently no response when she was using mupirocin.  Suggested that she may giving up on acyclovir too quickly and recommended she continue this treatment.  Also suggested that she may be having allergic reaction to 1 of her many allergens.  Reiterated her PCPs recommendation that she be seen by dermatology.  Also suggested that she try taking Benadryl every 4 hours to see if she might have some relief of her symptoms.  Patient asked if she went to the ED would she receive the same treatment.  I informed her that I did not know what treatment they might give her at the ED, that they have other evaluation methods available to them including blood work that we could not perform in an expedited fashion.  She states that she "cannot wait all weekend".   Final Clinical Impressions(s) / UC Diagnoses   Final diagnoses:  None   Discharge Instructions   None    ED Prescriptions   None    PDMP not reviewed this encounter.   Charma Igo, FNP 08/31/22  1109    ImmordinoJeannett Senior, FNP 08/31/22 1112

## 2022-08-31 NOTE — ED Triage Notes (Signed)
Patient presents to [facility] for lip lesion. States she was seen at her PCP 2x and treated with mupirocin and valtrex. Was instructed to follow-up with dermatologist if no improvement. States she is not improving, has increased oral swelling.

## 2022-08-31 NOTE — Discharge Instructions (Addendum)
Recommend you try an oral antihistamine to relieve your symptoms.  As suggested by your PCP, follow-up with a dermatologist evaluation if your symptoms are not improving.

## 2022-08-31 NOTE — ED Triage Notes (Signed)
Pt presents to the ED due to lip lesions. Pt has been seen and prescribed medications  for these lesions. Pt states its been going on for two weeks  and her lips are starting to itch. Pt A&Ox4

## 2022-08-31 NOTE — ED Provider Notes (Signed)
[facility] Provider Note    Event Date/Time   First MD Initiated Contact with Patient 08/31/22 1231     (approximate)   History   Allergic Reaction   HPI  Tracey Huff is a 52 y.o. female with history of hypertension, anxiety, breast cancer, type 2 diabetes, asthma and as listed in EMR presents to the emergency department for treatment and evaluation of sores to upper lip with tingling, itching, and swelling. No improvement with mupirocin. Aquaphor, or Valtrex.      Physical Exam   Triage Vital Signs: ED Triage Vitals  Enc Vitals Group     BP 08/31/22 1219 (!) 179/87     Pulse Rate 08/31/22 1219 72     Resp 08/31/22 1219 18     Temp 08/31/22 1219 98.9 F (37.2 C)     Temp Source 08/31/22 1219 Oral     SpO2 08/31/22 1219 100 %     Weight 08/31/22 1221 178 lb (80.7 kg)     Height --      Head Circumference --      Peak Flow --      Pain Score 08/31/22 1224 7     Pain Loc --      Pain Edu? --      Excl. in GC? --     Most recent vital signs: Vitals:   08/31/22 1219  BP: (!) 179/87  Pulse: 72  Resp: 18  Temp: 98.9 F (37.2 C)  SpO2: 100%    General: Awake, no distress.  CV:  Good peripheral perfusion.  Resp:  Normal effort.  Abd:  No distention.  Other:  Tender area on right upper outer lip without ulcerated lesion. Second area of tenderness inside right upper lip also without ulcerated lesion.     ED Results / Procedures / Treatments   Labs (all labs ordered are listed, but only abnormal results are displayed) Labs Reviewed - No data to display   EKG  Not indicated.   RADIOLOGY  Image and radiology report reviewed and interpreted by me. Radiology report consistent with the same.  Not indicated.  PROCEDURES:  Critical Care performed: No  Procedures   MEDICATIONS ORDERED IN ED:  Medications - No data to display   IMPRESSION / MDM / ASSESSMENT AND PLAN / ED COURSE   I have reviewed the triage  note.  Differential diagnosis includes, but is not limited to,  herpes simplex 1, local reaction to unknown substance.  Patient's presentation is most consistent with acute, uncomplicated illness.  52 year old female presenting to the emergency department for evaluation and treatment of lesions to her right upper lip, swelling of both the upper and lower lip, and itching.  Symptoms started after having bronchitis.  On exam, there are no vesicular or ulcerated lesions noted on her upper lip.  She feels that both lips are slightly swollen.  I do not appreciate significant swelling of the lips or tongue.  She also describes a sensation of itching in the areas of tenderness on her upper lip.  This is likely herpes simplex 1 and the lesions have not yet erupted.  She will be encouraged to continue taking her valacyclovir as prescribed and use Benadryl for itching and swelling.  She was also given a prescription for Abreva.  She was advised to follow-up with her primary care provider for symptoms that are not improving over the next several days.     FINAL CLINICAL IMPRESSION(S) / ED  DIAGNOSES   Final diagnoses:  Herpes simplex virus type 1 (HSV-1) dermatitis     Rx / DC Orders   ED Discharge Orders          Ordered    Docosanol 10 % CREA  Every 4 hours        08/31/22 1248             Note:  This document was prepared using Dragon voice recognition software and may include unintentional dictation errors.   Chinita Pester, FNP 08/31/22 1416    Sharyn Creamer, MD 08/31/22 2212

## 2022-09-01 ENCOUNTER — Encounter: Payer: Self-pay | Admitting: Dermatology

## 2022-09-02 DIAGNOSIS — F411 Generalized anxiety disorder: Secondary | ICD-10-CM | POA: Diagnosis not present

## 2022-09-09 ENCOUNTER — Other Ambulatory Visit: Payer: Self-pay | Admitting: Nurse Practitioner

## 2022-09-09 ENCOUNTER — Other Ambulatory Visit (HOSPITAL_COMMUNITY): Payer: Self-pay

## 2022-09-09 DIAGNOSIS — Z794 Long term (current) use of insulin: Secondary | ICD-10-CM

## 2022-09-09 DIAGNOSIS — F411 Generalized anxiety disorder: Secondary | ICD-10-CM | POA: Diagnosis not present

## 2022-09-10 ENCOUNTER — Other Ambulatory Visit (HOSPITAL_COMMUNITY): Payer: Self-pay

## 2022-09-10 ENCOUNTER — Ambulatory Visit (INDEPENDENT_AMBULATORY_CARE_PROVIDER_SITE_OTHER): Payer: Commercial Managed Care - PPO | Admitting: Psychology

## 2022-09-10 DIAGNOSIS — J3081 Allergic rhinitis due to animal (cat) (dog) hair and dander: Secondary | ICD-10-CM | POA: Diagnosis not present

## 2022-09-10 DIAGNOSIS — J301 Allergic rhinitis due to pollen: Secondary | ICD-10-CM | POA: Diagnosis not present

## 2022-09-10 DIAGNOSIS — F4322 Adjustment disorder with anxiety: Secondary | ICD-10-CM

## 2022-09-10 DIAGNOSIS — J3089 Other allergic rhinitis: Secondary | ICD-10-CM | POA: Diagnosis not present

## 2022-09-10 MED ORDER — OZEMPIC (0.25 OR 0.5 MG/DOSE) 2 MG/3ML ~~LOC~~ SOPN
0.2500 mg | PEN_INJECTOR | SUBCUTANEOUS | 0 refills | Status: DC
Start: 2022-09-10 — End: 2022-09-11
  Filled 2022-09-10 – 2022-09-11 (×2): qty 3, 30d supply, fill #0

## 2022-09-10 NOTE — Progress Notes (Signed)
? ? ? ? ? ? ? ? ? ? ? ? ? ? ?  Kian Ottaviano, LCMHC ?

## 2022-09-10 NOTE — Telephone Encounter (Signed)
Requested Prescriptions  Pending Prescriptions Disp Refills   Semaglutide,0.25 or 0.5MG /DOS, (OZEMPIC, 0.25 OR 0.5 MG/DOSE,) 2 MG/3ML SOPN 3 mL 0    Sig: Inject 0.25 mg into the skin once a week.     Endocrinology:  Diabetes - GLP-1 Receptor Agonists - semaglutide Failed - 09/09/2022  1:34 PM      Failed - HBA1C in normal range and within 180 days    Hgb A1c MFr Bld  Date Value Ref Range Status  05/10/2022 6.8 (H) <5.7 % of total Hgb Final    Comment:    For someone without known diabetes, a hemoglobin A1c value of 6.5% or greater indicates that they may have  diabetes and this should be confirmed with a follow-up  test. . For someone with known diabetes, a value <7% indicates  that their diabetes is well controlled and a value  greater than or equal to 7% indicates suboptimal  control. A1c targets should be individualized based on  duration of diabetes, age, comorbid conditions, and  other considerations. . Currently, no consensus exists regarding use of hemoglobin A1c for diagnosis of diabetes for children. .          Passed - Cr in normal range and within 360 days    Creat  Date Value Ref Range Status  05/10/2022 0.83 0.50 - 1.03 mg/dL Final   Creatinine, Urine  Date Value Ref Range Status  08/09/2022 205 20 - 275 mg/dL Final         Passed - Valid encounter within last 6 months    Recent Outpatient Visits           1 week ago Lip lesion   [facility] Berniece Salines, FNP   1 week ago Lip lesion   [facility] Danelle Berry, PA-C   1 month ago Annual physical exam   [facility] Della Goo F, FNP   1 month ago Mild persistent asthma with exacerbation   [facility] Berniece Salines, FNP   4 months ago Moderate persistent asthma without complication   [facility] Berniece Salines, FNP       Future Appointments             In 2  days Deirdre Evener, MD [facility]   In 2 months Deirdre Evener, MD [facility]   In 5 months Zane Herald, Rudolpho Sevin, FNP [facility]
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
## 2022-09-11 ENCOUNTER — Other Ambulatory Visit: Payer: Self-pay | Admitting: Nurse Practitioner

## 2022-09-11 ENCOUNTER — Other Ambulatory Visit (HOSPITAL_COMMUNITY): Payer: Self-pay

## 2022-09-11 DIAGNOSIS — E1165 Type 2 diabetes mellitus with hyperglycemia: Secondary | ICD-10-CM

## 2022-09-11 MED ORDER — OZEMPIC (0.25 OR 0.5 MG/DOSE) 2 MG/3ML ~~LOC~~ SOPN
0.2500 mg | PEN_INJECTOR | SUBCUTANEOUS | 0 refills | Status: DC
Start: 2022-09-11 — End: 2023-02-10
  Filled 2022-09-11 – 2022-10-22 (×2): qty 9, 90d supply, fill #0

## 2022-09-11 NOTE — Telephone Encounter (Signed)
Requested Prescriptions  Pending Prescriptions Disp Refills   Semaglutide,0.25 or 0.5MG /DOS, (OZEMPIC, 0.25 OR 0.5 MG/DOSE,) 2 MG/3ML SOPN 9 mL 0    Sig: Inject 0.25 mg into the skin once a week.     Endocrinology:  Diabetes - GLP-1 Receptor Agonists - semaglutide Failed - 09/11/2022  5:18 PM      Failed - HBA1C in normal range and within 180 days    Hgb A1c MFr Bld  Date Value Ref Range Status  05/10/2022 6.8 (H) <5.7 % of total Hgb Final    Comment:    For someone without known diabetes, a hemoglobin A1c value of 6.5% or greater indicates that they may have  diabetes and this should be confirmed with a follow-up  test. . For someone with known diabetes, a value <7% indicates  that their diabetes is well controlled and a value  greater than or equal to 7% indicates suboptimal  control. A1c targets should be individualized based on  duration of diabetes, age, comorbid conditions, and  other considerations. . Currently, no consensus exists regarding use of hemoglobin A1c for diagnosis of diabetes for children. .          Passed - Cr in normal range and within 360 days    Creat  Date Value Ref Range Status  05/10/2022 0.83 0.50 - 1.03 mg/dL Final   Creatinine, Urine  Date Value Ref Range Status  08/09/2022 205 20 - 275 mg/dL Final         Passed - Valid encounter within last 6 months    Recent Outpatient Visits           1 week ago Lip lesion   [facility] Berniece Salines, FNP   1 week ago Lip lesion   [facility] Danelle Berry, PA-C   1 month ago Annual physical exam   [facility] Della Goo F, FNP   1 month ago Mild persistent asthma with exacerbation   [facility] Berniece Salines, FNP   4 months ago Moderate persistent asthma without complication   [facility] Berniece Salines, FNP       Future Appointments              Tomorrow Deirdre Evener, MD [facility] [facility]   In 2 months Deirdre Evener, MD [facility]   In 5 months Zane Herald, Rudolpho Sevin, FNP [facility], [facility]

## 2022-09-12 ENCOUNTER — Other Ambulatory Visit (HOSPITAL_COMMUNITY): Payer: Self-pay

## 2022-09-12 ENCOUNTER — Ambulatory Visit: Payer: Commercial Managed Care - PPO | Admitting: Dermatology

## 2022-09-12 VITALS — BP 158/89 | HR 86

## 2022-09-12 DIAGNOSIS — K130 Diseases of lips: Secondary | ICD-10-CM | POA: Diagnosis not present

## 2022-09-12 DIAGNOSIS — L0100 Impetigo, unspecified: Secondary | ICD-10-CM

## 2022-09-12 DIAGNOSIS — L259 Unspecified contact dermatitis, unspecified cause: Secondary | ICD-10-CM

## 2022-09-12 MED ORDER — MUPIROCIN 2 % EX OINT: TOPICAL_OINTMENT | CUTANEOUS | 0 refills | Status: DC

## 2022-09-12 NOTE — Progress Notes (Signed)
   Follow-Up Visit   Subjective  Tracey Huff is a 52 y.o. female who presents for the following: Patient c/o a swelling and discoloration on her lips x 1 month, patient report sometimes this area will ooz and drain  The following portions of the chart were reviewed this encounter and updated as appropriate: medications, allergies, medical history  Review of Systems:  No other skin or systemic complaints except as noted in HPI or Assessment and Plan.  Objective  Well appearing patient in no apparent distress; mood and affect are within normal limits. A focused examination was performed of the following areas: Relevant exam findings are noted in the Assessment and Plan.    Assessment & Plan   Cheilitis with possible Impetigo/Contact dermatitis    mild edema and hyperpigmentation of  the [place] lips   Patient Photos showed crusting and weeping of the upper lips   Start Mupirocin ointment qd-bid   Recommend patch testing   Return for patch testing .  IAngelique Holm, CMA, am acting as scribe for Armida Sans, MD .   Documentation: I have reviewed the above documentation for accuracy and completeness, and I agree with the above.  Armida Sans, MD

## 2022-09-12 NOTE — Patient Instructions (Signed)
Due to recent changes in healthcare laws, you may see results of your pathology and/or laboratory studies on MyChart before the doctors have had a chance to review them. We understand that in some cases there may be results that are confusing or concerning to you. Please understand that not all results are received at the same time and often the doctors may need to interpret multiple results in order to provide you with the best plan of care or course of treatment. Therefore, we ask that you please give us 2 business days to thoroughly review all your results before contacting the office for clarification. Should we see a critical lab result, you will be contacted sooner.   If You Need Anything After Your Visit  If you have any questions or concerns for your doctor, please call our main line at [phone] and press option 4 to reach your doctor's medical assistant. If no one answers, please leave a voicemail as directed and we will return your call as soon as possible. Messages left after 4 pm will be answered the following business day.   You may also send us a message via MyChart. We typically respond to MyChart messages within 1-2 business days.  For prescription refills, please ask your pharmacy to contact our office. Our fax number is [phone].  If you have an urgent issue when the clinic is closed that cannot wait until the next business day, you can page your doctor at the number below.    Please note that while we do our best to be available for urgent issues outside of office hours, we are not available 24/7.   If you have an urgent issue and are unable to reach us, you may choose to seek medical care at your doctor's office, retail clinic, urgent care center, or emergency room.  If you have a medical emergency, please immediately call 911 or go to the emergency department.  Pager Numbers  - Dr. Kowalski: [phone]  - Dr. Moye: [phone]  - Dr. Stewart:  [phone]  In the event of inclement weather, please call our main line at [phone] for an update on the status of any delays or closures.  Dermatology Medication Tips: Please keep the boxes that topical medications come in in order to help keep track of the instructions about where and how to use these. Pharmacies typically print the medication instructions only on the boxes and not directly on the medication tubes.   If your medication is too expensive, please contact our office at [phone] option 4 or send us a message through MyChart.   We are unable to tell what your co-pay for medications will be in advance as this is different depending on your insurance coverage. However, we may be able to find a substitute medication at lower cost or fill out paperwork to get insurance to cover a needed medication.   If a prior authorization is required to get your medication covered by your insurance company, please allow us 1-2 business days to complete this process.  Drug prices often vary depending on where the prescription is filled and some pharmacies may offer cheaper prices.  The website www.goodrx.com contains coupons for medications through different pharmacies. The prices here do not account for what the cost may be with help from insurance (it may be cheaper with your insurance), but the website can give you the price if you did not use any insurance.  - You can print the associated coupon and take it with   your prescription to the pharmacy.  - You may also stop by our office during regular business hours and pick up a GoodRx coupon card.  - If you need your prescription sent electronically to a different pharmacy, notify our office through [facility] MyChart or by phone at [phone] option 4.     Si Usted Necesita Algo Despus de Su Visita  Tambin puede enviarnos un mensaje a travs de MyChart. Por lo general respondemos a los mensajes de MyChart en el transcurso de 1 a 2  das hbiles.  Para renovar recetas, por favor pida a su farmacia que se ponga en contacto con nuestra oficina. Nuestro nmero de fax es el [phone].  Si tiene un asunto urgente cuando la clnica est cerrada y que no puede esperar hasta el siguiente da hbil, puede llamar/localizar a su doctor(a) al nmero que aparece a continuacin.   Por favor, tenga en cuenta que aunque hacemos todo lo posible para estar disponibles para asuntos urgentes fuera del horario de oficina, no estamos disponibles las 24 horas del da, los 7 das de la semana.   Si tiene un problema urgente y no puede comunicarse con nosotros, puede optar por buscar atencin mdica  en el consultorio de su doctor(a), en una clnica privada, en un centro de atencin urgente o en una sala de emergencias.  Si tiene una emergencia mdica, por favor llame inmediatamente al 911 o vaya a la sala de emergencias.  Nmeros de bper  - Dr. Kowalski: [phone]  - Dra. Moye: [phone]  - Dra. Stewart: [phone]  En caso de inclemencias del tiempo, por favor llame a nuestra lnea principal al [phone] para una actualizacin sobre el estado de cualquier retraso o cierre.  Consejos para la medicacin en dermatologa: Por favor, guarde las cajas en las que vienen los medicamentos de uso tpico para ayudarle a seguir las instrucciones sobre dnde y cmo usarlos. Las farmacias generalmente imprimen las instrucciones del medicamento slo en las cajas y no directamente en los tubos del medicamento.   Si su medicamento es muy caro, por favor, pngase en contacto con nuestra oficina llamando al [phone] y presione la opcin 4 o envenos un mensaje a travs de MyChart.   No podemos decirle cul ser su copago por los medicamentos por adelantado ya que esto es diferente dependiendo de la cobertura de su seguro. Sin embargo, es posible que podamos encontrar un medicamento sustituto a menor costo o llenar un formulario para que el  seguro cubra el medicamento que se considera necesario.   Si se requiere una autorizacin previa para que su compaa de seguros cubra su medicamento, por favor permtanos de 1 a 2 das hbiles para completar este proceso.  Los precios de los medicamentos varan con frecuencia dependiendo del lugar de dnde se surte la receta y alguna farmacias pueden ofrecer precios ms baratos.  El sitio web www.goodrx.com tiene cupones para medicamentos de diferentes farmacias. Los precios aqu no tienen en cuenta lo que podra costar con la ayuda del seguro (puede ser ms barato con su seguro), pero el sitio web puede darle el precio si no utiliz ningn seguro.  - Puede imprimir el cupn correspondiente y llevarlo con su receta a la farmacia.  - Tambin puede pasar por nuestra oficina durante el horario de atencin regular y recoger una tarjeta de cupones de GoodRx.  - Si necesita que su receta se enve electrnicamente a una farmacia diferente, informe a nuestra oficina a travs de MyChart de [place]   o por telfono llamando al [phone] y presione la opcin 4.  

## 2022-09-16 DIAGNOSIS — F411 Generalized anxiety disorder: Secondary | ICD-10-CM | POA: Diagnosis not present

## 2022-09-17 ENCOUNTER — Ambulatory Visit (INDEPENDENT_AMBULATORY_CARE_PROVIDER_SITE_OTHER): Payer: Commercial Managed Care - PPO

## 2022-09-17 DIAGNOSIS — L239 Allergic contact dermatitis, unspecified cause: Secondary | ICD-10-CM | POA: Diagnosis not present

## 2022-09-19 ENCOUNTER — Ambulatory Visit (INDEPENDENT_AMBULATORY_CARE_PROVIDER_SITE_OTHER): Payer: Commercial Managed Care - PPO

## 2022-09-19 DIAGNOSIS — L239 Allergic contact dermatitis, unspecified cause: Secondary | ICD-10-CM

## 2022-09-19 NOTE — Progress Notes (Signed)
Patch testing was performed on 09/04/22 using standard technique. True test x 36 applied to back at visit today. Pt advised to keep panels dry until removal in 2 days for first read.

## 2022-09-19 NOTE — Progress Notes (Signed)
Patient here today for day 3 patch test reading. Panels were removed and patient was advised how to read testing area. Patient also advised do not soak nor scrub testing site.  Patient showed no signs of reactions but was very itchy around site #29. Photo taken of testing area today.  Dorathy Daft, RMA

## 2022-09-21 ENCOUNTER — Encounter: Payer: Self-pay | Admitting: Dermatology

## 2022-09-23 DIAGNOSIS — F411 Generalized anxiety disorder: Secondary | ICD-10-CM | POA: Diagnosis not present

## 2022-09-24 ENCOUNTER — Other Ambulatory Visit (HOSPITAL_COMMUNITY): Payer: Self-pay

## 2022-09-24 ENCOUNTER — Ambulatory Visit: Payer: Commercial Managed Care - PPO | Admitting: Dermatology

## 2022-09-24 DIAGNOSIS — Z7189 Other specified counseling: Secondary | ICD-10-CM | POA: Diagnosis not present

## 2022-09-24 DIAGNOSIS — Z79899 Other long term (current) drug therapy: Secondary | ICD-10-CM

## 2022-09-24 DIAGNOSIS — L230 Allergic contact dermatitis due to metals: Secondary | ICD-10-CM

## 2022-09-24 DIAGNOSIS — K130 Diseases of lips: Secondary | ICD-10-CM

## 2022-09-24 MED ORDER — OPZELURA 1.5 % EX CREA
1.0000 "application " | TOPICAL_CREAM | Freq: Two times a day (BID) | CUTANEOUS | 1 refills | Status: DC
  Filled 2022-09-24 – 2022-10-02 (×2): qty 60, 30d supply, fill #0

## 2022-09-24 NOTE — Progress Notes (Signed)
   Follow-Up Visit   Subjective  Tracey Huff is a 52 y.o. female who presents for the following: 7 day patch test follow up - she felt she had reactions at #8, #9 and #30 over the weekend. He lips are getting better with the mupirocin but they are still dark    The following portions of the chart were reviewed this encounter and updated as appropriate: medications, allergies, medical history  Review of Systems:  No other skin or systemic complaints except as noted in HPI or Assessment and Plan.  Objective  Well appearing patient in no apparent distress; mood and affect are within normal limits.  A focused examination was performed of the following areas: back, lips  Relevant exam findings are noted in the Assessment and Plan.    Assessment & Plan   CHEILITIS Exam Scaly erythematous patches at lips  Treatment Plan Continue Mupirocin oint x 4 more days then start opzelura cream qd-bid  Advised patient she has a positive reaction to nickel and she should avoid products containing nickel.   T.R.U.E. Test - 09/24/22 1100       Test Information   Manufacturer Other    Location Back    Number of Test 36    Reading Interval Day 8    Panel Panel 1;Panel 2;Panel 3      Panel 1   1. Nickel Sulfate 1              Return in about 1 month (around 10/25/2022).  I, Joanie Coddington, CMA, am acting as scribe for Armida Sans, MD .   Documentation: I have reviewed the above documentation for accuracy and completeness, and I agree with the above.  Armida Sans, MD

## 2022-09-24 NOTE — Patient Instructions (Signed)
Due to recent changes in healthcare laws, you may see results of your pathology and/or laboratory studies on MyChart before the doctors have had a chance to review them. We understand that in some cases there may be results that are confusing or concerning to you. Please understand that not all results are received at the same time and often the doctors may need to interpret multiple results in order to provide you with the best plan of care or course of treatment. Therefore, we ask that you please give us 2 business days to thoroughly review all your results before contacting the office for clarification. Should we see a critical lab result, you will be contacted sooner.   If You Need Anything After Your Visit  If you have any questions or concerns for your doctor, please call our main line at [phone] and press option 4 to reach your doctor's medical assistant. If no one answers, please leave a voicemail as directed and we will return your call as soon as possible. Messages left after 4 pm will be answered the following business day.   You may also send us a message via MyChart. We typically respond to MyChart messages within 1-2 business days.  For prescription refills, please ask your pharmacy to contact our office. Our fax number is [phone].  If you have an urgent issue when the clinic is closed that cannot wait until the next business day, you can page your doctor at the number below.    Please note that while we do our best to be available for urgent issues outside of office hours, we are not available 24/7.   If you have an urgent issue and are unable to reach us, you may choose to seek medical care at your doctor's office, retail clinic, urgent care center, or emergency room.  If you have a medical emergency, please immediately call 911 or go to the emergency department.  Pager Numbers  - Dr. Kowalski: [phone]  - Dr. Moye: [phone]  - Dr. Stewart:  [phone]  In the event of inclement weather, please call our main line at [phone] for an update on the status of any delays or closures.  Dermatology Medication Tips: Please keep the boxes that topical medications come in in order to help keep track of the instructions about where and how to use these. Pharmacies typically print the medication instructions only on the boxes and not directly on the medication tubes.   If your medication is too expensive, please contact our office at [phone] option 4 or send us a message through MyChart.   We are unable to tell what your co-pay for medications will be in advance as this is different depending on your insurance coverage. However, we may be able to find a substitute medication at lower cost or fill out paperwork to get insurance to cover a needed medication.   If a prior authorization is required to get your medication covered by your insurance company, please allow us 1-2 business days to complete this process.  Drug prices often vary depending on where the prescription is filled and some pharmacies may offer cheaper prices.  The website www.goodrx.com contains coupons for medications through different pharmacies. The prices here do not account for what the cost may be with help from insurance (it may be cheaper with your insurance), but the website can give you the price if you did not use any insurance.  - You can print the associated coupon and take it with   your prescription to the pharmacy.  - You may also stop by our office during regular business hours and pick up a GoodRx coupon card.  - If you need your prescription sent electronically to a different pharmacy, notify our office through [facility] MyChart or by phone at [phone] option 4.     Si Usted Necesita Algo Despus de Su Visita  Tambin puede enviarnos un mensaje a travs de MyChart. Por lo general respondemos a los mensajes de MyChart en el transcurso de 1 a 2  das hbiles.  Para renovar recetas, por favor pida a su farmacia que se ponga en contacto con nuestra oficina. Nuestro nmero de fax es el [phone].  Si tiene un asunto urgente cuando la clnica est cerrada y que no puede esperar hasta el siguiente da hbil, puede llamar/localizar a su doctor(a) al nmero que aparece a continuacin.   Por favor, tenga en cuenta que aunque hacemos todo lo posible para estar disponibles para asuntos urgentes fuera del horario de oficina, no estamos disponibles las 24 horas del da, los 7 das de la semana.   Si tiene un problema urgente y no puede comunicarse con nosotros, puede optar por buscar atencin mdica  en el consultorio de su doctor(a), en una clnica privada, en un centro de atencin urgente o en una sala de emergencias.  Si tiene una emergencia mdica, por favor llame inmediatamente al 911 o vaya a la sala de emergencias.  Nmeros de bper  - Dr. Kowalski: [phone]  - Dra. Moye: [phone]  - Dra. Stewart: [phone]  En caso de inclemencias del tiempo, por favor llame a nuestra lnea principal al [phone] para una actualizacin sobre el estado de cualquier retraso o cierre.  Consejos para la medicacin en dermatologa: Por favor, guarde las cajas en las que vienen los medicamentos de uso tpico para ayudarle a seguir las instrucciones sobre dnde y cmo usarlos. Las farmacias generalmente imprimen las instrucciones del medicamento slo en las cajas y no directamente en los tubos del medicamento.   Si su medicamento es muy caro, por favor, pngase en contacto con nuestra oficina llamando al [phone] y presione la opcin 4 o envenos un mensaje a travs de MyChart.   No podemos decirle cul ser su copago por los medicamentos por adelantado ya que esto es diferente dependiendo de la cobertura de su seguro. Sin embargo, es posible que podamos encontrar un medicamento sustituto a menor costo o llenar un formulario para que el  seguro cubra el medicamento que se considera necesario.   Si se requiere una autorizacin previa para que su compaa de seguros cubra su medicamento, por favor permtanos de 1 a 2 das hbiles para completar este proceso.  Los precios de los medicamentos varan con frecuencia dependiendo del lugar de dnde se surte la receta y alguna farmacias pueden ofrecer precios ms baratos.  El sitio web www.goodrx.com tiene cupones para medicamentos de diferentes farmacias. Los precios aqu no tienen en cuenta lo que podra costar con la ayuda del seguro (puede ser ms barato con su seguro), pero el sitio web puede darle el precio si no utiliz ningn seguro.  - Puede imprimir el cupn correspondiente y llevarlo con su receta a la farmacia.  - Tambin puede pasar por nuestra oficina durante el horario de atencin regular y recoger una tarjeta de cupones de GoodRx.  - Si necesita que su receta se enve electrnicamente a una farmacia diferente, informe a nuestra oficina a travs de MyChart de    o por telfono llamando al [phone] y presione la opcin 4.  

## 2022-09-25 ENCOUNTER — Other Ambulatory Visit (HOSPITAL_COMMUNITY): Payer: Self-pay

## 2022-09-26 ENCOUNTER — Ambulatory Visit (INDEPENDENT_AMBULATORY_CARE_PROVIDER_SITE_OTHER): Payer: Commercial Managed Care - PPO | Admitting: Psychology

## 2022-09-26 ENCOUNTER — Other Ambulatory Visit (HOSPITAL_COMMUNITY): Payer: Self-pay

## 2022-09-26 DIAGNOSIS — F4322 Adjustment disorder with anxiety: Secondary | ICD-10-CM

## 2022-09-27 DIAGNOSIS — J3081 Allergic rhinitis due to animal (cat) (dog) hair and dander: Secondary | ICD-10-CM | POA: Diagnosis not present

## 2022-09-27 DIAGNOSIS — J3089 Other allergic rhinitis: Secondary | ICD-10-CM | POA: Diagnosis not present

## 2022-09-27 DIAGNOSIS — J301 Allergic rhinitis due to pollen: Secondary | ICD-10-CM | POA: Diagnosis not present

## 2022-09-27 NOTE — Progress Notes (Signed)
[facility] Counselor/Therapist Progress Note  Patient ID: Tracey Huff, MRN: [id number],    Date: 09/27/2022  Time Spent: 2:32pm-3:19pm     Treatment Type: Individual Therapy   pt is seen for an in person visit.  Reported Symptoms: stress w/ recent APS report.    Mental Status Exam: Appearance:  Well Groomed     Behavior: Appropriate  Motor: Normal  Speech/Language:  Clear and Coherent  Affect: Appropriate  Mood: anxious  Thought process: normal  Thought content:   WNL  Sensory/Perceptual disturbances:   WNL  Orientation: oriented to person, place, time/date, and situation  Attention: Good  Concentration: Good  Memory: WNL  Fund of knowledge:  Good  Insight:   Good  Judgment:  Good  Impulse Control: Good   Risk Assessment: Danger to Self:  No Self-injurious Behavior: No Danger to Others: No Duty to Warn:no Physical Aggression / Violence:No  Access to Firearms a concern: No  Gang Involvement:No   Subjective: Counselor assessed pt current functioning per pt report.  Processed w/pt coping w/ recent stressors.  Validated emotions expressed and explored ways of coping and stress management.   Pt affect wnl.  Pt reported that she has been feeling stressed w/ recent APS report- made anonymously- against her for not caring for husband.  Pt discussed that suspects family member that has created other drama.  Pt discussed that not concerned for investigation and welcomes visits from APS if assists in reducing drama.  Pt reported that she is setting boundaries w/ family.  Pt reports that she is looking to her supports and focus on taking time for self as well.   Interventions: Cognitive Behavioral Therapy and supportive  Diagnosis:Adjustment disorder with anxiety  Plan: Pt f/u in 2-3 weeks for counseling.  Pt to f/u a scheduled w/ PCP.    Individualized Treatment Plan Strengths: seeking counseling   Supports: daughters, friends    Goal/Needs for Treatment:   In order of importance to patient 1) increase appropriate expression of feelings.   2) cope w/ stressors 3) ---    Client Statement of Needs: "I do things to cover up and not feel.  Creating safe space to talk.  Trying to find myself- for last 4 years I had to take care of him and put my self off. "    Treatment Level: outpt counseling  Symptoms: feeling anxious/on edge, worry, irritability  Client Treatment Preferences:biweekly counseling. Continue med management w/ PCP    Healthcare consumer's goal for treatment:   Counselor, Forde Radon, [facility] will support the patient's ability to achieve the goals identified. Cognitive Behavioral Therapy, Assertive Communication/Conflict Resolution Training, Relaxation Training, ACT, Humanistic and other evidenced-based practices will be used to promote progress towards healthy functioning.    Healthcare consumer will: Actively participate in therapy, working towards healthy functioning.     *Justification for Continuation/Discontinuation of Goal: R=Revised, O=Ongoing, A=Achieved, D=Discontinued   Goal 1) Increase pt verbal expression of feelings and emotions. Baseline date 09/10/22: Progress towards goal 0; How Often - Daily Target Date Goal Was reviewed Status Code Progress towards goal/Likert rating  09/10/23                            Goal 2) Increase self care and coping skills daily to manage stress to reduce anxiety and irritability AEB Pt report and therapist observation. Baseline date 09/10/22: Progress towards goal 0; How Often - Daily Target Date Goal Was reviewed Status  Code Progress towards goal  09/10/23                            This plan has been reviewed and created by the following participants:  This plan will be reviewed at least every 12 months. Date Behavioral Health Clinician Date Guardian/Patient   09/10/22          [name] 09/10/22 Verbal Consent Provided                    Forde Radon [facility]

## 2022-09-27 NOTE — Progress Notes (Signed)
? ? ? ? ? ? ? ? ? ? ? ? ? ? ?  Tracey Huff, LCMHC ?

## 2022-09-28 ENCOUNTER — Other Ambulatory Visit (HOSPITAL_COMMUNITY): Payer: Self-pay

## 2022-10-01 ENCOUNTER — Other Ambulatory Visit (HOSPITAL_COMMUNITY): Payer: Self-pay

## 2022-10-01 ENCOUNTER — Other Ambulatory Visit: Payer: Self-pay

## 2022-10-02 ENCOUNTER — Other Ambulatory Visit (HOSPITAL_COMMUNITY): Payer: Self-pay

## 2022-10-03 ENCOUNTER — Other Ambulatory Visit (HOSPITAL_COMMUNITY): Payer: Self-pay

## 2022-10-04 ENCOUNTER — Encounter: Payer: Self-pay | Admitting: Dermatology

## 2022-10-07 ENCOUNTER — Encounter: Payer: Self-pay | Admitting: Nurse Practitioner

## 2022-10-07 DIAGNOSIS — F411 Generalized anxiety disorder: Secondary | ICD-10-CM | POA: Diagnosis not present

## 2022-10-14 ENCOUNTER — Ambulatory Visit: Payer: Self-pay | Admitting: *Deleted

## 2022-10-14 NOTE — Telephone Encounter (Signed)
  Chief Complaint: upper lip swollen Symptoms:  swelling started Friday. Mild itching inner side of lip.  Crusted skin on outer lip. Applied cream that prescribed by dermatologist and not helping. Now lip seeping fluid.  Not bleeding no cracks in lip Frequency: 10/11/22 Pertinent Negatives: Patient denies fever no bleeding no skin peeling reported. No redness reported. No swelling of tongue or face  Disposition: [] ED /[] Urgent Care (no appt availability in office) / [x] Appointment(In office/virtual)/ []  [facility]/ [] Home Care/ [] Refused Recommended Disposition /[] [facility]/ []  Follow-up with PCP Additional Notes:   Appt tomorrow. Recommended calling appt for allergist as well. Please advise.    Reason for Disposition  Lip swelling lasts > 3 days  Answer Assessment - Initial Assessment Questions 1. ONSET: "When did the swelling start?" (e.g., minutes, hours, days)     Friday 10/11/22 2. SEVERITY: "How swollen is it?"     puffy 3. ITCHING: "Is there any itching?" If Yes, ask: "How much?"   (Scale 1-10; mild, moderate or severe)     Mild  4. PAIN: "Is the swelling painful to touch?" If Yes, ask: "How painful is it?"   (Scale 1-10; mild, moderate or severe)     No  5. CAUSE: "What do you think is causing the lip swelling?"     Not sure has been seen for this issue before and now sx back 6. RECURRENT SYMPTOM: "Have you had lip swelling before?" If Yes, ask: "When was the last time?" "What happened that time?"     Yes usually has crusted skin and now seeping clear fluid  7. OTHER SYMPTOMS: "Do you have any other symptoms?" (e.g., toothache)     Upper lip swollen. Seeping fluid. Itching inside upper lip 8. PREGNANCY: "Is there any chance you are pregnant?" "When was your last menstrual period?"     na  Protocols used: Lip Swelling-A-AH

## 2022-10-14 NOTE — Telephone Encounter (Signed)
Spoke to patient, she will call her allergist and her dermatologist. Appointment also here on 6/11 if she needs

## 2022-10-15 ENCOUNTER — Ambulatory Visit: Payer: Commercial Managed Care - PPO | Admitting: Nurse Practitioner

## 2022-10-15 NOTE — Progress Notes (Deleted)
   LMP 11/15/2014    Subjective:    Patient ID: Tracey Huff, female    DOB: 29-May-1970, 52 y.o.   MRN: [id number]  HPI: Tracey Huff is a 51 y.o. female  No chief complaint on file.  Lip lesion/rash: saw patient on 08/09/2022.  She reported she had a lesion on her lip.  Tested her for RPR and HSV and both came back negative. Tried mupirocin ointment but no improvement. Trialed acyclovir, no improvement. She then went to dermatology on 09/12/2022 and was restarted on mupirocin ointment.  She was then seen again at dermatology on 09/24/2022 instructed to continue mupirocin ointment for four more days and then start opzelura cream qd-bid. Patient reports ***.   Relevant past medical, surgical, family and social history reviewed and updated as indicated. Interim medical history since our last visit reviewed. Allergies and medications reviewed and updated.  Review of Systems  Constitutional: Negative for fever or weight change.  Respiratory: Negative for cough and shortness of breath.   Cardiovascular: Negative for chest pain or palpitations.  Gastrointestinal: Negative for abdominal pain, no bowel changes.  Musculoskeletal: Negative for gait problem or joint swelling.  Skin: positive for rash on lips.  Neurological: Negative for dizziness or headache.  No other specific complaints in a complete review of systems (except as listed in HPI above).      Objective:    LMP 11/15/2014   Wt Readings from Last 3 Encounters:  08/31/22 177 lb 14.6 oz (80.7 kg)  08/29/22 178 lb (80.7 kg)  08/09/22 179 lb 9.6 oz (81.5 kg)    Physical Exam  Constitutional: Patient appears well-developed and well-nourished. Obese  No distress.  HEENT: head atraumatic, normocephalic, pupils equal and reactive to light,, neck supple, throat within normal limits, upper lip scaly erythematous patches Cardiovascular: Normal rate, regular rhythm and normal heart sounds.  No murmur heard. No BLE  edema. Pulmonary/Chest: Effort normal and breath sounds normal. No respiratory distress. Abdominal: Soft.  There is no tenderness. Psychiatric: Patient has a normal mood and affect. behavior is normal. Judgment and thought content normal.  Results for orders placed or performed in visit on 08/09/22  Herpes simplex virus culture  Result Value Ref Range   MICRO NUMBER: [id number]    SPECIMEN QUALITY: Adequate    Source MOUTH    STATUS: FINAL    HSV CULTURE: Not Isolated   RPR  Result Value Ref Range   RPR Ser Ql NON-REACTIVE NON-REACTIVE  Microalbumin / creatinine urine ratio  Result Value Ref Range   Creatinine, Urine 205 20 - 275 mg/dL   Microalb, Ur 1.9 mg/dL   Microalb Creat Ratio 9 <30 mg/g creat  Cytology - PAP  Result Value Ref Range   High risk HPV Negative    Neisseria Gonorrhea Negative    Chlamydia Negative    Adequacy Satisfactory for evaluation.    Diagnosis      - Negative for intraepithelial lesion or malignancy (NILM)   Comment Normal Reference Range HPV - Negative    Comment Normal Reference Ranger Chlamydia - Negative    Comment      Normal Reference Range Neisseria Gonorrhea - Negative      Assessment & Plan:   Problem List Items Addressed This Visit   None    Follow up plan: No follow-ups on file.

## 2022-10-17 ENCOUNTER — Ambulatory Visit: Payer: Commercial Managed Care - PPO | Admitting: Dermatology

## 2022-10-17 VITALS — BP 151/93 | HR 75

## 2022-10-17 DIAGNOSIS — Z7189 Other specified counseling: Secondary | ICD-10-CM

## 2022-10-17 DIAGNOSIS — J301 Allergic rhinitis due to pollen: Secondary | ICD-10-CM | POA: Diagnosis not present

## 2022-10-17 DIAGNOSIS — K130 Diseases of lips: Secondary | ICD-10-CM

## 2022-10-17 DIAGNOSIS — R21 Rash and other nonspecific skin eruption: Secondary | ICD-10-CM

## 2022-10-17 DIAGNOSIS — J3081 Allergic rhinitis due to animal (cat) (dog) hair and dander: Secondary | ICD-10-CM | POA: Diagnosis not present

## 2022-10-17 DIAGNOSIS — J3089 Other allergic rhinitis: Secondary | ICD-10-CM | POA: Diagnosis not present

## 2022-10-17 DIAGNOSIS — L259 Unspecified contact dermatitis, unspecified cause: Secondary | ICD-10-CM

## 2022-10-17 DIAGNOSIS — L239 Allergic contact dermatitis, unspecified cause: Secondary | ICD-10-CM

## 2022-10-17 NOTE — Progress Notes (Signed)
   Follow-Up Visit   Subjective  Tracey Huff is a 52 y.o. female who presents for the following: Swelling, crusting, blistering on the lips that started 10/11/22  - outside of lips and inside mouth itched, then blistering crusting, tenderness occurred. Pt did eat a veggie bowl at work including several vegetables. Pt does see an allergist and gets injections for allergies.   The following portions of the chart were reviewed this encounter and updated as appropriate: medications, allergies, medical history  Review of Systems:  No other skin or systemic complaints except as noted in HPI or Assessment and Plan.  Objective  Well appearing patient in no apparent distress; mood and affect are within normal limits. A focused examination was performed of the following areas: Relevant exam findings are noted in the Assessment and Plan.   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 Condition is bothersome/symptomatic for patient. Currently flared. Exam: Edema and crusting of the lips.   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 Claudette Laws) will make referral today.   Continue Opzelura cream to aa's BID.   Return for appointment as scheduled.  Tracey Huff, CMA, am acting as scribe for Armida Sans, MD .  Documentation: I have reviewed the above documentation for accuracy and completeness, and I agree with the above.  Armida Sans, MD

## 2022-10-17 NOTE — Patient Instructions (Signed)
Due to recent changes in healthcare laws, you may see results of your pathology and/or laboratory studies on MyChart before the doctors have had a chance to review them. We understand that in some cases there may be results that are confusing or concerning to you. Please understand that not all results are received at the same time and often the doctors may need to interpret multiple results in order to provide you with the best plan of care or course of treatment. Therefore, we ask that you please give us 2 business days to thoroughly review all your results before contacting the office for clarification. Should we see a critical lab result, you will be contacted sooner.   If You Need Anything After Your Visit  If you have any questions or concerns for your doctor, please call our main line at [phone] and press option 4 to reach your doctor's medical assistant. If no one answers, please leave a voicemail as directed and we will return your call as soon as possible. Messages left after 4 pm will be answered the following business day.   You may also send us a message via MyChart. We typically respond to MyChart messages within 1-2 business days.  For prescription refills, please ask your pharmacy to contact our office. Our fax number is [phone].  If you have an urgent issue when the clinic is closed that cannot wait until the next business day, you can page your doctor at the number below.    Please note that while we do our best to be available for urgent issues outside of office hours, we are not available 24/7.   If you have an urgent issue and are unable to reach us, you may choose to seek medical care at your doctor's office, retail clinic, urgent care center, or emergency room.  If you have a medical emergency, please immediately call 911 or go to the emergency department.  Pager Numbers  - Dr. Kowalski: [phone]  - Dr. Moye: [phone]  - Dr. Stewart:  [phone]  In the event of inclement weather, please call our main line at [phone] for an update on the status of any delays or closures.  Dermatology Medication Tips: Please keep the boxes that topical medications come in in order to help keep track of the instructions about where and how to use these. Pharmacies typically print the medication instructions only on the boxes and not directly on the medication tubes.   If your medication is too expensive, please contact our office at [phone] option 4 or send us a message through MyChart.   We are unable to tell what your co-pay for medications will be in advance as this is different depending on your insurance coverage. However, we may be able to find a substitute medication at lower cost or fill out paperwork to get insurance to cover a needed medication.   If a prior authorization is required to get your medication covered by your insurance company, please allow us 1-2 business days to complete this process.  Drug prices often vary depending on where the prescription is filled and some pharmacies may offer cheaper prices.  The website www.goodrx.com contains coupons for medications through different pharmacies. The prices here do not account for what the cost may be with help from insurance (it may be cheaper with your insurance), but the website can give you the price if you did not use any insurance.  - You can print the associated coupon and take it with   your prescription to the pharmacy.  - You may also stop by our office during regular business hours and pick up a GoodRx coupon card.  - If you need your prescription sent electronically to a different pharmacy, notify our office through [facility] MyChart or by phone at [phone] option 4.     Si Usted Necesita Algo Despus de Su Visita  Tambin puede enviarnos un mensaje a travs de MyChart. Por lo general respondemos a los mensajes de MyChart en el transcurso de 1 a 2  das hbiles.  Para renovar recetas, por favor pida a su farmacia que se ponga en contacto con nuestra oficina. Nuestro nmero de fax es el [phone].  Si tiene un asunto urgente cuando la clnica est cerrada y que no puede esperar hasta el siguiente da hbil, puede llamar/localizar a su doctor(a) al nmero que aparece a continuacin.   Por favor, tenga en cuenta que aunque hacemos todo lo posible para estar disponibles para asuntos urgentes fuera del horario de oficina, no estamos disponibles las 24 horas del da, los 7 das de la semana.   Si tiene un problema urgente y no puede comunicarse con nosotros, puede optar por buscar atencin mdica  en el consultorio de su doctor(a), en una clnica privada, en un centro de atencin urgente o en una sala de emergencias.  Si tiene una emergencia mdica, por favor llame inmediatamente al 911 o vaya a la sala de emergencias.  Nmeros de bper  - Dr. Kowalski: [phone]  - Dra. Moye: [phone]  - Dra. Stewart: [phone]  En caso de inclemencias del tiempo, por favor llame a nuestra lnea principal al [phone] para una actualizacin sobre el estado de cualquier retraso o cierre.  Consejos para la medicacin en dermatologa: Por favor, guarde las cajas en las que vienen los medicamentos de uso tpico para ayudarle a seguir las instrucciones sobre dnde y cmo usarlos. Las farmacias generalmente imprimen las instrucciones del medicamento slo en las cajas y no directamente en los tubos del medicamento.   Si su medicamento es muy caro, por favor, pngase en contacto con nuestra oficina llamando al [phone] y presione la opcin 4 o envenos un mensaje a travs de MyChart.   No podemos decirle cul ser su copago por los medicamentos por adelantado ya que esto es diferente dependiendo de la cobertura de su seguro. Sin embargo, es posible que podamos encontrar un medicamento sustituto a menor costo o llenar un formulario para que el  seguro cubra el medicamento que se considera necesario.   Si se requiere una autorizacin previa para que su compaa de seguros cubra su medicamento, por favor permtanos de 1 a 2 das hbiles para completar este proceso.  Los precios de los medicamentos varan con frecuencia dependiendo del lugar de dnde se surte la receta y alguna farmacias pueden ofrecer precios ms baratos.  El sitio web www.goodrx.com tiene cupones para medicamentos de diferentes farmacias. Los precios aqu no tienen en cuenta lo que podra costar con la ayuda del seguro (puede ser ms barato con su seguro), pero el sitio web puede darle el precio si no utiliz ningn seguro.  - Puede imprimir el cupn correspondiente y llevarlo con su receta a la farmacia.  - Tambin puede pasar por nuestra oficina durante el horario de atencin regular y recoger una tarjeta de cupones de GoodRx.  - Si necesita que su receta se enve electrnicamente a una farmacia diferente, informe a nuestra oficina a travs de MyChart de [place]   o por telfono llamando al [phone] y presione la opcin 4.  

## 2022-10-19 ENCOUNTER — Encounter: Payer: Self-pay | Admitting: Dermatology

## 2022-10-21 ENCOUNTER — Other Ambulatory Visit: Payer: Self-pay | Admitting: Internal Medicine

## 2022-10-21 ENCOUNTER — Other Ambulatory Visit (HOSPITAL_COMMUNITY): Payer: Self-pay

## 2022-10-21 ENCOUNTER — Other Ambulatory Visit: Payer: Self-pay

## 2022-10-21 DIAGNOSIS — Z79899 Other long term (current) drug therapy: Secondary | ICD-10-CM

## 2022-10-21 DIAGNOSIS — F411 Generalized anxiety disorder: Secondary | ICD-10-CM | POA: Diagnosis not present

## 2022-10-21 DIAGNOSIS — I10 Essential (primary) hypertension: Secondary | ICD-10-CM

## 2022-10-21 DIAGNOSIS — T7840XA Allergy, unspecified, initial encounter: Secondary | ICD-10-CM

## 2022-10-21 MED ORDER — LOSARTAN POTASSIUM 25 MG PO TABS
25.0000 mg | ORAL_TABLET | ORAL | 1 refills | Status: DC
Start: 2022-10-21 — End: 2023-02-06
  Filled 2022-10-21: qty 90, 90d supply, fill #0
  Filled 2023-02-05: qty 90, 90d supply, fill #1

## 2022-10-21 MED ORDER — TAMOXIFEN CITRATE 20 MG PO TABS
20.0000 mg | ORAL_TABLET | Freq: Every day | ORAL | 0 refills | Status: DC
  Filled 2022-10-21: qty 30, 30d supply, fill #0

## 2022-10-21 NOTE — Telephone Encounter (Signed)
Requested Prescriptions  Pending Prescriptions Disp Refills   losartan (COZAAR) 25 MG tablet 90 tablet 1    Sig: Take 1 tablet (25 mg total) by mouth every morning.     Cardiovascular:  Angiotensin Receptor Blockers Failed - 10/21/2022  8:51 AM      Failed - Last BP in normal range    BP Readings from Last 1 Encounters:  10/17/22 (!) 151/93         Passed - Cr in normal range and within 180 days    Creat  Date Value Ref Range Status  05/10/2022 0.83 0.50 - 1.03 mg/dL Final   Creatinine, Urine  Date Value Ref Range Status  08/09/2022 205 20 - 275 mg/dL Final         Passed - K in normal range and within 180 days    Potassium  Date Value Ref Range Status  05/10/2022 4.2 3.5 - 5.3 mmol/L Final  08/24/2014 3.3 (L) mmol/L Final    Comment:    3.5-5.1 NOTE: New Reference Range  07/12/14          Passed - Patient is not pregnant      Passed - Valid encounter within last 6 months    Recent Outpatient Visits           1 month ago Lip lesion   [facility] Berniece Salines, FNP   1 month ago Lip lesion   [facility] Danelle Berry, PA-C   2 months ago Annual physical exam   [facility] Della Goo F, FNP   2 months ago Mild persistent asthma with exacerbation   [facility] Berniece Salines, FNP   5 months ago Moderate persistent asthma without complication   [facility] Berniece Salines, FNP       Future Appointments             In 1 month Deirdre Evener, MD [facility]   In 3 months Zane Herald, Rudolpho Sevin, FNP [facility]

## 2022-10-22 ENCOUNTER — Other Ambulatory Visit (HOSPITAL_COMMUNITY): Payer: Self-pay

## 2022-10-22 ENCOUNTER — Other Ambulatory Visit: Payer: Self-pay

## 2022-10-28 ENCOUNTER — Ambulatory Visit (INDEPENDENT_AMBULATORY_CARE_PROVIDER_SITE_OTHER): Payer: Commercial Managed Care - PPO | Admitting: Psychology

## 2022-10-28 DIAGNOSIS — F4322 Adjustment disorder with anxiety: Secondary | ICD-10-CM | POA: Diagnosis not present

## 2022-10-28 NOTE — Progress Notes (Signed)
[facility] Counselor/Therapist Progress Note  Patient ID: Tracey Huff, MRN: [id number],    Date: 10/28/2022  Time Spent: 9:00am-9:50am     Treatment Type: Individual Therapy   pt is seen for a virtual audio visit via phone.  Pt phone wouldn't allow video or audio access via caregility.  Pt consent to virtual visit and understands limitations of telehealth visits.    Pt joins from her home and counselor from her home office.     Reported Symptoms: setting boundaries w/ husband's family, feeling more at ease w/ taking steps for self.    Mental Status Exam: Appearance:  Well Groomed     Behavior: Appropriate  Motor: Normal  Speech/Language:  Clear and Coherent  Affect: Appropriate  Mood: anxious  Thought process: normal  Thought content:   WNL  Sensory/Perceptual disturbances:   WNL  Orientation: oriented to person, place, time/date, and situation  Attention: Good  Concentration: Good  Memory: WNL  Fund of knowledge:  Good  Insight:   Good  Judgment:  Good  Impulse Control: Good   Risk Assessment: Danger to Self:  No Self-injurious Behavior: No Danger to Others: No Duty to Warn:no Physical Aggression / Violence:No  Access to Firearms a concern: No  Gang Involvement:No   Subjective: Counselor assessed pt current functioning per pt report.  Processed w/pt steps she is taking to cope w/ recent stressors.   Explored positives of setting boundaries and asserting boundaries.  Discussed some work stressors.   Pt affect wnl.  Pt reported that she feels good about steps taken to set boundaries w/ her husband's family.  Pt reported that following up w/ lawyer to have husband wishes and necessary paperwork for Healthcare Power of attorney and Power of attorney set up.  Pt discussed feels good to stand up and assert her and husband's needs and try to protect other's feelings. 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  Pt discussed how  she has been handling w/ encouraging staff to follow policies and not taking sides.      Interventions: Cognitive Behavioral Therapy and supportive  Diagnosis:Adjustment disorder with anxiety  Plan: Pt f/u in 2-3 weeks for counseling.  Pt to f/u a scheduled w/ PCP.    Individualized Treatment Plan Strengths: seeking counseling   Supports: daughters, friends    Goal/Needs for Treatment:  In order of importance to patient 1) increase appropriate expression of feelings.   2) cope w/ stressors 3) ---    Client Statement of Needs: "I do things to cover up and not feel.  Creating safe space to talk.  Trying to find myself- for last 4 years I had to take care of him and put my self off. "    Treatment Level: outpt counseling  Symptoms: feeling anxious/on edge, worry, irritability  Client Treatment Preferences:biweekly counseling. Continue med management w/ PCP    Healthcare consumer's goal for treatment:   Counselor, Forde Radon, [facility] will support the patient's ability to achieve the goals identified. Cognitive Behavioral Therapy, Assertive Communication/Conflict Resolution Training, Relaxation Training, ACT, Humanistic and other evidenced-based practices will be used to promote progress towards healthy functioning.    Healthcare consumer will: Actively participate in therapy, working towards healthy functioning.     *Justification for Continuation/Discontinuation of Goal: R=Revised, O=Ongoing, A=Achieved, D=Discontinued   Goal 1) Increase pt verbal expression of feelings and emotions. Baseline date 09/10/22: Progress towards goal 0; How Often - Daily Target Date Goal Was reviewed Status  Code Progress towards goal/Likert rating  09/10/23                            Goal 2) Increase self care and coping skills daily to manage stress to reduce anxiety and irritability AEB Pt report and therapist observation. Baseline date 09/10/22: Progress towards goal 0; How Often - Daily Target Date  Goal Was reviewed Status Code Progress towards goal  09/10/23                            This plan has been reviewed and created by the following participants:  This plan will be reviewed at least every 12 months. Date Behavioral Health Clinician Date Guardian/Patient   09/10/22          [facility] [name] [facility] 09/10/22 Verbal Consent Provided                       Forde Radon [facility]

## 2022-10-30 ENCOUNTER — Ambulatory Visit: Payer: Commercial Managed Care - PPO | Admitting: Dermatology

## 2022-11-01 DIAGNOSIS — J3089 Other allergic rhinitis: Secondary | ICD-10-CM | POA: Diagnosis not present

## 2022-11-01 DIAGNOSIS — J3081 Allergic rhinitis due to animal (cat) (dog) hair and dander: Secondary | ICD-10-CM | POA: Diagnosis not present

## 2022-11-01 DIAGNOSIS — J301 Allergic rhinitis due to pollen: Secondary | ICD-10-CM | POA: Diagnosis not present

## 2022-11-04 ENCOUNTER — Other Ambulatory Visit: Payer: Self-pay | Admitting: Nurse Practitioner

## 2022-11-04 ENCOUNTER — Telehealth: Payer: Self-pay | Admitting: Nurse Practitioner

## 2022-11-04 DIAGNOSIS — F411 Generalized anxiety disorder: Secondary | ICD-10-CM | POA: Diagnosis not present

## 2022-11-04 DIAGNOSIS — R21 Rash and other nonspecific skin eruption: Secondary | ICD-10-CM

## 2022-11-04 NOTE — Telephone Encounter (Signed)
Pt is calling to request a referral to [facility] or any other allergist that can handle her case. Please advise CB- [phone]

## 2022-11-05 DIAGNOSIS — J3089 Other allergic rhinitis: Secondary | ICD-10-CM | POA: Diagnosis not present

## 2022-11-05 DIAGNOSIS — J301 Allergic rhinitis due to pollen: Secondary | ICD-10-CM | POA: Diagnosis not present

## 2022-11-05 DIAGNOSIS — J3081 Allergic rhinitis due to animal (cat) (dog) hair and dander: Secondary | ICD-10-CM | POA: Diagnosis not present

## 2022-11-06 ENCOUNTER — Telehealth: Payer: Self-pay

## 2022-11-06 ENCOUNTER — Other Ambulatory Visit: Payer: Self-pay | Admitting: Nurse Practitioner

## 2022-11-06 DIAGNOSIS — R21 Rash and other nonspecific skin eruption: Secondary | ICD-10-CM

## 2022-11-06 NOTE — Telephone Encounter (Signed)
Copied from CRM [id number]. Topic: Referral - Status >> Nov 05, 2022  2:52 PM Macon Large wrote: Reason for CRM: Pt requests referral to [facility] [place] for allergy testing

## 2022-11-13 ENCOUNTER — Encounter: Payer: Self-pay | Admitting: Allergy

## 2022-11-13 ENCOUNTER — Ambulatory Visit: Payer: Commercial Managed Care - PPO | Admitting: Allergy

## 2022-11-13 ENCOUNTER — Other Ambulatory Visit: Payer: Self-pay

## 2022-11-13 ENCOUNTER — Other Ambulatory Visit: Payer: Self-pay | Admitting: Nurse Practitioner

## 2022-11-13 VITALS — BP 130/80 | HR 81 | Temp 98.4°F | Ht 60.0 in | Wt 174.1 lb

## 2022-11-13 DIAGNOSIS — J3089 Other allergic rhinitis: Secondary | ICD-10-CM | POA: Insufficient documentation

## 2022-11-13 DIAGNOSIS — R220 Localized swelling, mass and lump, head: Secondary | ICD-10-CM

## 2022-11-13 DIAGNOSIS — L509 Urticaria, unspecified: Secondary | ICD-10-CM | POA: Insufficient documentation

## 2022-11-13 DIAGNOSIS — L299 Pruritus, unspecified: Secondary | ICD-10-CM

## 2022-11-13 DIAGNOSIS — K130 Diseases of lips: Secondary | ICD-10-CM

## 2022-11-13 DIAGNOSIS — R21 Rash and other nonspecific skin eruption: Secondary | ICD-10-CM | POA: Diagnosis not present

## 2022-11-13 DIAGNOSIS — F411 Generalized anxiety disorder: Secondary | ICD-10-CM

## 2022-11-13 DIAGNOSIS — J4540 Moderate persistent asthma, uncomplicated: Secondary | ICD-10-CM

## 2022-11-13 DIAGNOSIS — Z636 Dependent relative needing care at home: Secondary | ICD-10-CM

## 2022-11-13 DIAGNOSIS — T781XXD Other adverse food reactions, not elsewhere classified, subsequent encounter: Secondary | ICD-10-CM | POA: Insufficient documentation

## 2022-11-13 NOTE — Assessment & Plan Note (Signed)
Managed by [facility] Continue plan as per your other allergist including [facility].

## 2022-11-13 NOTE — Telephone Encounter (Signed)
Copied from CRM [id number]. Topic: General - Other >> Nov 13, 2022 12:37 PM Everette C wrote: Reason for CRM: Medication Refill - Medication: busPIRone (BUSPAR) 7.5 MG tablet [[id number]]  Has the patient contacted their pharmacy? Yes.   (Agent: If no, request that the patient contact the pharmacy for the refill. If patient does not wish to contact the pharmacy document the reason why and proceed with request.) (Agent: If yes, when and what did the pharmacy advise?)  Preferred Pharmacy (with phone number or street name): [facility] [address] [place] [address] Phone: [phone] Fax: [phone] Hours: M-F 7:30am-6pm   Has the patient been seen for an appointment in the last year OR does the patient have an upcoming appointment? Yes.    Agent: Please be advised that RX refills may take up to 3 business days. We ask that you follow-up with your pharmacy.

## 2022-11-13 NOTE — Patient Instructions (Addendum)
Lip No need for RAST testing. Not sure what's causing this. Avoid spicy, acidic foods. Take pictures of rash/swelling.   Start Xyzal 5mg  at night to help with itching. If symptoms are not controlled or causes drowsiness let us know. Avoid the following potential triggers: alcohol, tight clothing, NSAIDs, hot showers and getting overheated. See below for proper skin care.  Use fragrance free and dye free products. Don't use mint products.  Try kids flouride free toothpaste.  Get bloodwork:  We are ordering labs, so please allow 1-2 weeks for the results to come back. With the newly implemented Cures Act, the labs might be visible to you at the same time that they become visible to me. However, I will not address the results until all of the results are back, so please be patient.   Environmental allergies Continue plan as per your other allergist.  Asthma Continue plan as per your other allergist.  Follow up if needed depending on bloodwork results.   Skin care recommendations  Bath time: Always use lukewarm water. AVOID very hot or cold water. Keep bathing time to 5-10 minutes. Do NOT use bubble bath. Use a mild soap and use just enough to wash the dirty areas. Do NOT scrub skin vigorously.  After bathing, pat dry your skin with a towel. Do NOT rub or scrub the skin.  Moisturizers and prescriptions:  ALWAYS apply moisturizers immediately after bathing (within 3 minutes). This helps to lock-in moisture. Use the moisturizer several times a day over the whole body. Good summer moisturizers include: Aveeno, CeraVe, Cetaphil. Good winter moisturizers include: Aquaphor, Vaseline, Cerave, Cetaphil, Eucerin, Vanicream. When using moisturizers along with medications, the moisturizer should be applied about one hour after applying the medication to prevent diluting effect of the medication or moisturize around where you applied the medications. When not using medications, the  moisturizer can be continued twice daily as maintenance.  Laundry and clothing: Avoid laundry products with added color or perfumes. Use unscented hypo-allergenic laundry products such as Tide free, Cheer free & gentle, and All free and clear.  If the skin still seems dry or sensitive, you can try double-rinsing the clothes. Avoid tight or scratchy clothing such as wool. Do not use fabric softeners or dyer sheets.

## 2022-11-13 NOTE — Assessment & Plan Note (Addendum)
Lip irritation started on 4/5 and since then having perioral pruritus and swelling at times. Dermatology prescribed opzelura. Negative hsv swab. Patch testing positive to nickel. Denies changes in diet, meds, personal care products. Spicy foods make things worse. Patient concerned about allergies but her allergist (at [facility]) did skin testing recently which was positive to trees, grass, weed, ragweed, mold, dust mites, cockroach, dog, cat, feathers. Negative to walnut, peanut, pecan, cashew, almond, [place] nut, wheat, milk, egg, trout, oyster, shrimp, flounder, tuna, clam and catfish, crab and soy. 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 Discussed with patient at length that she does not need any RAST testing for this.  It seems like she had an initial insult to her lips that caused the blistering/cracking of the skin which the pictures look like cheilitis and now the new skin seems to be hypersensitive.  Etiology unclear but recommend to avoid spicy foods and acidic foods as those tend to irritate sensitive skin. Take pictures of the rash/swelling.  Start Xyzal 5mg  at night to help with itching. If symptoms are not controlled or causes drowsiness let us know. Avoid the following potential triggers: alcohol, tight clothing, NSAIDs, hot showers and getting overheated. See below for proper skin care.  Use fragrance free and dye free products. Don't use mint products.  Try kids flouride free toothpaste.  Get bloodwork to rule out other etiologies.

## 2022-11-13 NOTE — Progress Notes (Signed)
New Patient Note  RE: Tracey Huff MRN: [id number] DOB: [birth year] Date of Office Visit: 11/13/2022  Consult requested by: Deirdre Evener, MD Primary care provider: Berniece Salines, FNP  Chief Complaint: Establish Care (Pt states that her lips are darker than normal itchy rash that seeps fluid x 3 months. States it maybe food related.), Rash, and Pruritus  History of Present Illness: I had the pleasure of seeing Tracey Huff for initial evaluation at the [facility] on 11/13/2022. She is a 52 y.o. female, who is referred here by Dr. Gwen Pounds (derm) for the evaluation of allergic reaction.  Patient had some top lip irritation and swelling since 08/09/22. Initially they thought it was a cold sore and was given Abreva topically with no benefit.  Patient saw dermatology and was given opzelura with some benefit but lips are still not back to normal.   Patient has lip itching and tingling when around perfumes. She also noted perioral pruritus after eating spicy foods.   Mainly occurs on her lips. Describes them as itchy, dry, swelling, seeping. Symptoms resolve within a few days, the itching usually lasts about 1 day. Associated symptoms include: none. Frequency of episodes: multiple times per week. Suspected triggers are unknown. Denies any fevers, chills, changes in medications, foods, personal care products or recent infections.  She has tried the following therapies: benadryl prn with some benefit. Systemic steroids: no.  Previous work up includes: saw dermatology and had patch testing which was positive to nickel. Previous history of rash/hives: sometimes breaks out in hives.  Patient is up to date with the following cancer screening tests: physical exam, pap smear, colonoscopy. Not up to date with mammogram - diagnosed with breast cancer in 2022.   Currently using Aquaphor and opzelura. She took her acrylic nails off as well.  Using crest clean free  toothpaste. Patient is flossing with mint flavored floss stick.  Not using mouthwash.   Derm referral note: "Recommend Rast testing for recurrent allergic reactions. Patient had patch testing in our office. Please contact us if you have any questions or concerns. "  10/17/2022 derm OV: "Differential diagnosis:  Allergic contact dermatitis likely to foods/spices/peppers   Treatment Plan: Previous patch testing showed allergy to nickel, but based on history of eating a vegetable bowl a few days before the reaction started I recommend allergist performing RAST testing (Dr. Claudette Laws) will make referral today. "  Assessment and Plan: Tracey Huff is a 52 y.o. female with: Lip rash/swelling Lip irritation started on 4/5 and since then having perioral pruritus and swelling at times. Dermatology prescribed opzelura. Negative hsv swab. Patch testing positive to nickel. Denies changes in diet, meds, personal care products. Spicy foods make things worse. Patient concerned about allergies but her allergist (at [facility]) did skin testing recently which was positive to trees, grass, weed, ragweed, mold, dust mites, cockroach, dog, cat, feathers. Negative to walnut, peanut, pecan, cashew, almond, [place] nut, wheat, milk, egg, trout, oyster, shrimp, flounder, tuna, clam and catfish, crab and soy. 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 Discussed with patient at length that she does not need any RAST testing for this.  It seems like she had an initial insult to her lips that caused the blistering/cracking of the skin which the pictures look like cheilitis and now the new skin seems to be hypersensitive.  Etiology unclear but recommend to avoid spicy foods and acidic foods as those tend to irritate sensitive skin. Take  pictures of the rash/swelling.  Start Xyzal 5mg  at night to help with itching. If symptoms are not controlled or causes drowsiness let us know. Avoid the following potential triggers:  alcohol, tight clothing, NSAIDs, hot showers and getting overheated. See below for proper skin care.  Use fragrance free and dye free products. Don't use mint products.  Try kids flouride free toothpaste.  Get bloodwork to rule out other etiologies.   Other allergic rhinitis Managed by [facility] Continue plan as per your other allergist including AIT.  Asthma Managed by [facility]. Continue inhalers as per your other allergist.  Return if symptoms worsen or fail to improve.  No orders of the defined types were placed in this encounter.  Lab Orders         Alpha-Gal Panel         ANA w/Reflex         C3 and C4         CBC with Differential/Platelet         C1 esterase inhibitor, functional         C1 Esterase Inhibitor         Chronic Urticaria         Comprehensive metabolic panel         C-reactive protein         Sedimentation rate         Thyroid Cascade Profile         Tryptase      Other allergy screening: Asthma: yes Currently on Trelegy. Patient used to be on Symbicort.   Rhino conjunctivitis: yes Patient currently on AIT for 6-7 years by [facility] which seems to be helping. Currently on Singulair and Xyzal daily.  Patient had skin testing on 2024 - which was positive to trees, grass, weed, ragweed, mold, dust mites, cockroach, dog, cat, feathers.   Food allergy: yes 2024 skin testing was negative to walnut, peanut, pecan, cashew, almond, [place] nut, wheat, milk, egg, trout, oyster, shrimp, flounder, tuna, clam and catfish, crab and soy. Positive to lobster. Avoiding lobster. Tolerates fish and shrimp.   Medication allergy: no Hymenoptera allergy: no Large localized reaction Eczema: As a child.  History of recurrent infections suggestive of immunodeficency: no  Diagnostics: None.   Past Medical History: Patient Active Problem List   Diagnosis Date Noted   Lip rash/swelling 11/13/2022   Lip swelling 11/13/2022   Pruritus  11/13/2022   Urticaria 11/13/2022   Other allergic rhinitis 11/13/2022   Other adverse food reactions, not elsewhere classified, subsequent encounter 11/13/2022   Type 2 diabetes mellitus with hyperglycemia, with long-term current use of insulin (HCC) 07/26/2022   Colon cancer screening    Persistent adjustment disorder with anxiety 12/20/2021   Breast cancer (HCC) 05/15/2021   Genetic testing 05/10/2021   Family history of prostate cancer 04/17/2021   Allergic rhinitis 03/21/2021   Allergic rhinitis due to animal (cat) (dog) hair and dander 03/21/2021   Allergic rhinitis due to pollen 03/21/2021   Chronic allergic conjunctivitis 03/21/2021   Carcinoma of upper-outer quadrant of left breast in female, estrogen receptor positive (HCC) 03/21/2021   High grade squamous intraepithelial cervical dysplasia 01/29/2019   CIN III (cervical intraepithelial neoplasia grade III) with severe dysplasia 08/11/2018   Atypical squamous cell changes of undetermined significance (ASCUS) on cervical cytology with positive high risk human papilloma virus (HPV) 06/29/2018   Prediabetes 06/24/2018   Class 1 obesity due to excess calories with serious comorbidity and body mass  index (BMI) of 33.0 to 33.9 in adult 05/20/2018   Essential hypertension 05/20/2018   Hyperlipidemia 05/20/2018   Asthma 05/20/2018   Anxiety in acute stress reaction 05/20/2018   Status post hysterectomy 11/15/2014   Past Medical History:  Diagnosis Date   Allergy    Anxiety    Asthma    Breast cancer (HCC)    INVASIVE MAMMARY CARCINOMA- left   Family history of prostate cancer    Fibroid uterus 11/15/2014   Hyperlipidemia    Hypertension    Menorrhagia with irregular cycle 11/15/2014   Pre-diabetes    Urticaria    Past Surgical History: Past Surgical History:  Procedure Laterality Date   ABDOMINAL HYSTERECTOMY N/A 11/15/2014   Procedure: HYSTERECTOMY ABDOMINAL/BILATERAL SALPINGECTOMY ;  Surgeon: Conard Novak, MD;   Location: [facility];  Service: Gynecology;  Laterality: N/A;   BREAST BIOPSY Right    2019 negative   BREAST BIOPSY Left 03/09/2021   u/s biopsy, 12-1 o'clock, VENUS" clip-path pending   BREAST LUMPECTOMY,RADIO FREQ LOCALIZER,AXILLARY SENTINEL LYMPH NODE BIOPSY Left 03/28/2021   Procedure: BREAST LUMPECTOMY,RADIO FREQ LOCALIZER,AXILLARY SENTINEL LYMPH NODE BIOPSY;  Surgeon: Leafy Ro, MD;  Location: [facility];  Service: General;  Laterality: Left;   BREAST RECONSTRUCTION WITH PLACEMENT OF TISSUE EXPANDER AND FLEX HD (ACELLULAR HYDRATED DERMIS) Bilateral 05/15/2021   Procedure: BILATERAL BREAST RECONSTRUCTION WITH PLACEMENT OF TISSUE EXPANDER AND FLEX HD (ACELLULAR HYDRATED DERMIS);  Surgeon: Allena Napoleon, MD;  Location: [facility];  Service: Plastics;  Laterality: Bilateral;   CESAREAN SECTION  1990   COLONOSCOPY WITH PROPOFOL N/A 01/04/2022   Procedure: COLONOSCOPY WITH PROPOFOL;  Surgeon: Wyline Mood, MD;  Location: [facility];  Service: Gastroenterology;  Laterality: N/A;   COLPOSCOPY  07/28/2018   CYSTOSCOPY N/A 11/15/2014   Procedure: CYSTOSCOPY;  Surgeon: Conard Novak, MD;  Location: [facility];  Service: Gynecology;  Laterality: N/A;   DILATION AND CURETTAGE OF UTERUS     GANGLION CYST EXCISION Left    REMOVAL OF BILATERAL TISSUE EXPANDERS WITH PLACEMENT OF BILATERAL BREAST IMPLANTS Bilateral 10/02/2021   Procedure: REMOVAL OF BILATERAL TISSUE EXPANDERS WITH PLACEMENT OF BILATERAL BREAST IMPLANTS;  Surgeon: Allena Napoleon, MD;  Location: [facility];  Service: Plastics;  Laterality: Bilateral;   TOTAL MASTECTOMY Bilateral 05/15/2021   Procedure: TOTAL MASTECTOMY, Bilateral Simple Mastectomy, skin sparing;  Surgeon: Leafy Ro, MD;  Location: [facility];  Service: General;  Laterality: Bilateral;  Provider requesting for 2 hours / 120 min for procedure.   UTERINE FIBROID SURGERY     Medication List:  Current Outpatient Medications  Medication Sig Dispense Refill    albuterol (PROVENTIL HFA) 108 (90 Base) MCG/ACT inhaler Inhale 1-2 puffs into the lungs every 4-6 hours as needed for cough/wheeze 6.7 g 0   Ascorbic Acid (VITAMIN C) 100 MG tablet Take 100 mg by mouth daily.     atorvastatin (LIPITOR) 10 MG tablet Take 1 tablet (10 mg total) by mouth daily. Bedtime 90 tablet 1   AUVI-Q 0.3 MG/0.3ML SOAJ injection Inject 0.3 mg into the muscle as needed for anaphylaxis.     azelastine (ASTELIN) 0.1 % nasal spray Place 1-2 sprays into both nostrils 2 (two) times daily. 30 mL 6   diphenhydrAMINE (BENADRYL) 2 % cream Apply 1 application topically 2 (two) times daily as needed for itching.     Docosanol 10 % CREA Apply 1 Application topically every 4 (four) hours. 2 g 0   ELDERBERRY PO Take 1 capsule by  mouth daily.     ezetimibe (ZETIA) 10 MG tablet Take 1 tablet (10 mg total) by mouth daily. 90 tablet 3   fluticasone (FLONASE) 50 MCG/ACT nasal spray Place 1-2 sprays into both nostrils daily. 16 g 6   fluticasone (FLONASE) 50 MCG/ACT nasal spray Place 1-2 sprays into both nostrils daily. 16 g 6   Fluticasone-Umeclidin-Vilant (TRELEGY ELLIPTA) 200-62.5-25 MCG/ACT AEPB Inhale 1 puff into the lungs daily. 60 each 6   hydrocortisone cream 1 % Apply 1 application topically daily as needed for itching.     hydrOXYzine (VISTARIL) 25 MG capsule TAKE 1 CAPSULE (25 MG TOTAL) BY MOUTH AT BEDTIME AS NEEDED FOR UP TO 30 DOSES (DIFFICULTY SLEEPING). 30 capsule 1   ipratropium-albuterol (DUONEB) 0.5-2.5 (3) MG/3ML SOLN Take 3 mLs by nebulization every 6 (six) hours as needed (wheeze, SOB, cough variant asthma). 360 mL 1   levocetirizine (XYZAL) 5 MG tablet Take 1 tablet (5 mg total) by mouth every evening. 30 tablet 6   levocetirizine (XYZAL) 5 MG tablet Take 1 tablet (5 mg total) by mouth every evening. 30 tablet 6   losartan (COZAAR) 25 MG tablet Take 1 tablet (25 mg total) by mouth every morning. 90 tablet 1   montelukast (SINGULAIR) 10 MG tablet Take 1 tablet (10 mg total) by  mouth every evening. 30 tablet 6   montelukast (SINGULAIR) 10 MG tablet Take 1 tablet (10 mg total) by mouth every evening. 30 tablet 6   mupirocin ointment (BACTROBAN) 2 % Apply 1 Application topically 2 (two) times daily. 22 g 0   mupirocin ointment (BACTROBAN) 2 % Apply to skin qd-bid 22 g 0   Olopatadine HCl (PATADAY) 0.2 % SOLN Instill 1 drop into affected eye once daily 2.5 mL 6   Ruxolitinib Phosphate (OPZELURA) 1.5 % CREA Apply 1 application topically as directed 1- 2  times daily. 60 g 1   Semaglutide,0.25 or 0.5MG /DOS, (OZEMPIC, 0.25 OR 0.5 MG/DOSE,) 2 MG/3ML SOPN Inject 0.25 mg into the skin once a week. 9 mL 0   tamoxifen (NOLVADEX) 20 MG tablet Take 1 tablet (20 mg total) by mouth daily. Patient needs an appointment prior to 30 day supply runs out or it may not be refilled until seen again 30 tablet 0   zolpidem (AMBIEN) 5 MG tablet Take 1 tablet (5 mg total) by mouth at bedtime as needed for sleep. 30 tablet 0   No current facility-administered medications for this visit.   Allergies: Allergies  Allergen Reactions   Macadamia Nut Oil Shortness Of Breath   Apple Juice Nausea And Vomiting   [place] Nut (Berthollefia Excelsa) Nausea And Vomiting and Swelling    Tongue swelling   Carrot Oil Nausea And Vomiting    Can not cooked raw carrots   Fruit & Vegetable Daily [Nutritional Supplements] Nausea And Vomiting    Cannot tolerate apples, peaches, plums, and nectarines   Kiwi Extract Nausea And Vomiting   Peach [Prunus Persica] Nausea And Vomiting   Social History: Social History   Socioeconomic History   Marital status: Married    Spouse name: Peyton Najjar   Number of children: 2   Years of education: Not on file   Highest education level: Not on file  Occupational History   Not on file  Tobacco Use   Smoking status: Never    Passive exposure: Never   Smokeless tobacco: Never  Vaping Use   Vaping Use: Never used  Substance and Sexual Activity   Alcohol use: Not Currently  Comment: occassional   Drug use: No   Sexual activity: Yes    Partners: Male    Birth control/protection: Surgical  Other Topics Concern   Not on file  Social History Narrative   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 Lives with husband.    Social Determinants of Health   Financial Resource Strain: Low Risk  (08/09/2022)   Overall Financial Resource Strain (CARDIA)    Difficulty of Paying Living Expenses: Not hard at all  Food Insecurity: No Food Insecurity (08/09/2022)   Hunger Vital Sign    Worried About Running Out of Food in the Last Year: Never true    Ran Out of Food in the Last Year: Never true  Transportation Needs: No Transportation Needs (08/09/2022)   PRAPARE - Administrator, Civil Service (Medical): No    Lack of Transportation (Non-Medical): No  Physical Activity: Insufficiently Active (08/09/2022)   Exercise Vital Sign    Days of Exercise per Week: 2 days    Minutes of Exercise per Session: 30 min  Stress: No Stress Concern Present (08/09/2022)   [employer] of Occupational Health - Occupational Stress Questionnaire    Feeling of Stress : Only a little  Social Connections: Socially Integrated (08/09/2022)   Social Connection and Isolation Panel [NHANES]    Frequency of Communication with Friends and Family: More than three times a week    Frequency of Social Gatherings with Friends and Family: More than three times a week    Attends Religious Services: More than 4 times per year    Active Member of [employer] or Organizations: No    Attends Engineer, structural: More than 4 times per year    Marital Status: Married   Lives in an apartment. 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  HENT:  Negative for congestion and rhinorrhea.        Lips itching and dry.  Eyes:  Negative for itching.  Respiratory:  Negative for cough, chest tightness, shortness of breath and wheezing.   Cardiovascular:  Negative for chest pain.  Gastrointestinal:  Negative for abdominal pain.  Genitourinary:  Negative for difficulty urinating.  Skin:  Positive for rash.  Allergic/Immunologic: Positive for environmental allergies and food allergies.  Neurological:  Negative for headaches.    Objective: BP 130/80   Pulse 81   Temp 98.4 F (36.9 C)   Ht 5' (1.524 m)   Wt 174 lb 1.6 oz (79 kg)   LMP 11/15/2014   SpO2 98%   BMI 34.00 kg/m  Body mass index is 34 kg/m. Physical Exam Vitals and nursing note reviewed.  Constitutional:      Appearance: Normal appearance. She is well-developed.  HENT:     Head: Normocephalic and atraumatic.     Right Ear: Tympanic membrane and external ear normal.  Left Ear: Tympanic membrane and external ear normal.     Nose: Nose normal.     Mouth/Throat:     Mouth: Mucous membranes are moist.     Pharynx: Oropharynx is clear.     Comments: No lip angioedema appreciated today. The color of her lips seem to be more hyperpigmented compared to her "normal" lips that she showed me a picture of. Eyes:     Conjunctiva/sclera: Conjunctivae normal.  Cardiovascular:     Rate and Rhythm: Normal rate and regular rhythm.     Heart sounds: Normal heart sounds. No murmur heard.    No friction rub. No gallop.  Pulmonary:     Effort: Pulmonary effort is normal.     Breath sounds: Normal breath  sounds. No wheezing, rhonchi or rales.  Musculoskeletal:     Cervical back: Neck supple.  Skin:    General: Skin is warm.     Findings: No rash.  Neurological:     Mental Status: She is alert and oriented to person, place, and time.  Psychiatric:        Behavior: Behavior normal.    The plan was reviewed with the patient/family, and all questions/concerned were addressed.  It was my pleasure to see Soma today and participate in her care. Please feel free to contact me with any questions or concerns.  Sincerely,  Wyline Mood, DO Allergy & Immunology  [facility] office: [phone] [facility] office: [phone]

## 2022-11-13 NOTE — Assessment & Plan Note (Signed)
Managed by [facility]. Continue inhalers as per your other allergist.

## 2022-11-14 NOTE — Telephone Encounter (Signed)
Unable to refill per protocol, Rx expired. Discontinued 05/10/22. Requested Prescriptions  Pending Prescriptions Disp Refills   busPIRone (BUSPAR) 7.5 MG tablet 180 tablet 1    Sig: Take 1 tablet once daily at night; may take 1 additional tablet daily if needed for anxiety.     Psychiatry: Anxiolytics/Hypnotics - Non-controlled Passed - 11/13/2022  1:50 PM      Passed - Valid encounter within last 12 months    Recent Outpatient Visits           2 months ago Lip lesion   [facility] Berniece Salines, FNP   2 months ago Lip lesion   [facility] Danelle Berry, PA-C   3 months ago Annual physical exam   [facility] Della Goo F, FNP   3 months ago Mild persistent asthma with exacerbation   [facility] Berniece Salines, FNP   6 months ago Moderate persistent asthma without complication   [facility] Berniece Salines, FNP       Future Appointments             In 1 week Deirdre Evener, MD [facility]   In 2 months Zane Herald, Rudolpho Sevin, FNP [facility]

## 2022-11-18 DIAGNOSIS — F411 Generalized anxiety disorder: Secondary | ICD-10-CM | POA: Diagnosis not present

## 2022-11-20 ENCOUNTER — Other Ambulatory Visit (HOSPITAL_COMMUNITY): Payer: Self-pay

## 2022-11-20 ENCOUNTER — Other Ambulatory Visit: Payer: Self-pay | Admitting: Nurse Practitioner

## 2022-11-20 ENCOUNTER — Ambulatory Visit: Payer: Self-pay | Admitting: *Deleted

## 2022-11-20 ENCOUNTER — Telehealth: Payer: Self-pay

## 2022-11-20 DIAGNOSIS — F4322 Adjustment disorder with anxiety: Secondary | ICD-10-CM

## 2022-11-20 MED ORDER — BUSPIRONE HCL 7.5 MG PO TABS
7.5000 mg | ORAL_TABLET | Freq: Two times a day (BID) | ORAL | 1 refills | Status: DC | PRN
Start: 2022-11-20 — End: 2023-02-10
  Filled 2022-11-20: qty 60, 30d supply, fill #0

## 2022-11-20 NOTE — Telephone Encounter (Signed)
Noted. Still 2 labs pending.

## 2022-11-20 NOTE — Telephone Encounter (Signed)
Patient called in regards to lab results. I did informed the patient that Dr. Selena Batten has not review them yet but she will receive a call once she does. It looks like two of her labs have not resulted yet.

## 2022-11-20 NOTE — Telephone Encounter (Signed)
Patient notified

## 2022-11-20 NOTE — Telephone Encounter (Signed)
  Chief Complaint: Anxiety Symptoms: Pt's husband Dx with Glioblastoma weeks ago, having seizures, in Rehab.  Frequency: na Pertinent Negatives: Patient denies suicidal ideation Disposition: [] ED /[] Urgent Care (no appt availability in office) / [] Appointment(In office/virtual)/ []  [facility]/ [] Home Care/ [] Refused Recommended Disposition /[] [facility]/ [x]  Follow-up with PCP Additional Notes: Pt sees her therapist tomorrow. CAlled requesting refill of Buspar. States "I have some because I take it as needed, but this is 52 years old." Med refill appt secured for Monday 11/25/22. Med is not on current med profile. Please review, would like refill ASAP if possible. [facility]. Reason for Disposition  Recent traumatic event (e.g., death of a loved one, job loss, victim/witness of crime)  Answer Assessment - Initial Assessment Questions 1. CONCERN: "Did anything happen that prompted you to call today?"      Husband had seizure 6/23. Has brain CA 2. ANXIETY SYMPTOMS: "Can you describe how you (your loved one; patient) have been feeling?" (e.g., tense, restless, panicky, anxious, keyed up, overwhelmed, sense of impending doom).      Overwhelmed. 3. ONSET: "How long have you been feeling this way?" (e.g., hours, days, weeks)     10/27/22 4. SEVERITY: "How would you rate the level of anxiety?" (e.g., 0 - 10; or mild, moderate, severe).     *No Answer* 5. FUNCTIONAL IMPAIRMENT: "How have these feelings affected your ability to do daily activities?" "Have you had more difficulty than usual doing your normal daily activities?" (e.g., getting better, same, worse; self-care, school, work, interactions)     *No Answer* 6. HISTORY: "Have you felt this way before?" "Have you ever been diagnosed with an anxiety problem in the past?" (e.g., generalized anxiety disorder, panic attacks, PTSD). If Yes, ask: "How was this problem treated?" (e.g., medicines, counseling, etc.)      yes 7. RISK OF HARM - SUICIDAL IDEATION: "Do you ever have thoughts of hurting or killing yourself?" If Yes, ask:  "Do you have these feelings now?" "Do you have a plan on how you would do this?"     No 8. TREATMENT:  "What has been done so far to treat this anxiety?" (e.g., medicines, relaxation strategies). "What has helped?"      9. TREATMENT - THERAPIST: "Do you have a counselor or therapist? Name?"     Yes. See her tomorrow 10. POTENTIAL TRIGGERS: "Do you drink caffeinated beverages (e.g., coffee, colas, teas), and how much daily?" "Do you drink alcohol or use any drugs?" "Have you started any new medicines recently?"       Husband has CA 11. PATIENT SUPPORT: "Who is with you now?" "Who do you live with?" "Do you have family or friends who you can talk to?"         12. OTHER SYMPTOMS: "Do you have any other symptoms?" (e.g., feeling depressed, trouble concentrating, trouble sleeping, trouble breathing, palpitations or fast heartbeat, chest pain, sweating, nausea, or diarrhea)       No  Protocols used: Anxiety and Panic Attack-A-AH

## 2022-11-21 ENCOUNTER — Other Ambulatory Visit (HOSPITAL_COMMUNITY): Payer: Self-pay

## 2022-11-21 ENCOUNTER — Ambulatory Visit (INDEPENDENT_AMBULATORY_CARE_PROVIDER_SITE_OTHER): Payer: Commercial Managed Care - PPO | Admitting: Psychology

## 2022-11-21 ENCOUNTER — Other Ambulatory Visit: Payer: Self-pay | Admitting: Internal Medicine

## 2022-11-21 DIAGNOSIS — F4322 Adjustment disorder with anxiety: Secondary | ICD-10-CM | POA: Diagnosis not present

## 2022-11-21 NOTE — Progress Notes (Signed)
[facility] Counselor/Therapist Progress Note  Patient ID: MLISS WEDIN, MRN: [id number],    Date: 11/21/2022  Time Spent: 4:05pm-4:35pm     Treatment Type: Individual Therapy   pt is seen for an in person visit.  Reported Symptoms: increased anxiety since husband's recent seizure/health issues.  Mental Status Exam: Appearance:  Well Groomed     Behavior: Appropriate  Motor: Normal  Speech/Language:  Clear and Coherent  Affect: Appropriate  Mood: anxious  Thought process: normal  Thought content:   WNL  Sensory/Perceptual disturbances:   WNL  Orientation: oriented to person, place, time/date, and situation  Attention: Good  Concentration: Good  Memory: WNL  Fund of knowledge:  Good  Insight:   Good  Judgment:  Good  Impulse Control: Good   Risk Assessment: Danger to Self:  No Self-injurious Behavior: No Danger to Others: No Duty to Warn:no Physical Aggression / Violence:No  Access to Firearms a concern: No  Gang Involvement:No   Subjective: Counselor assessed pt current functioning per pt report.  Processed w/pt increased anxiety and stressors.  Explored w/ pt her supports and how she is caring for self through stressors.     Pt affect wnl.  Pt arrived 35 minutes late for appointment.  Pt reported her husband had a major seizure on 7/3 and has been in hospital and now rehab.  Pt reported increased anxiety since.  Pt reported he wants to try new tx for the tumor and declined hospice care.  Pt reports she is trying to get things in order.  Pt discussed her support of brother and daughter.  Pt reported that she is trying to keep active w/ work and did some things for self last week for time to self.    Interventions: Cognitive Behavioral Therapy and supportive  Diagnosis:Adjustment disorder with anxiety  Plan: Pt f/u in 2-3 weeks for counseling.  Pt to f/u a scheduled w/ PCP.    Individualized Treatment Plan Strengths: seeking counseling   Supports:  daughters, friends    Goal/Needs for Treatment:  In order of importance to patient 1) increase appropriate expression of feelings.   2) cope w/ stressors 3) ---    Client Statement of Needs: "I do things to cover up and not feel.  Creating safe space to talk.  Trying to find myself- for last 4 years I had to take care of him and put my self off. "    Treatment Level: outpt counseling  Symptoms: feeling anxious/on edge, worry, irritability  Client Treatment Preferences:biweekly counseling. Continue med management w/ PCP    Healthcare consumer's goal for treatment:   Counselor, Forde Radon, [facility] will support the patient's ability to achieve the goals identified. Cognitive Behavioral Therapy, Assertive Communication/Conflict Resolution Training, Relaxation Training, ACT, Humanistic and other evidenced-based practices will be used to promote progress towards healthy functioning.    Healthcare consumer will: Actively participate in therapy, working towards healthy functioning.     *Justification for Continuation/Discontinuation of Goal: R=Revised, O=Ongoing, A=Achieved, D=Discontinued   Goal 1) Increase pt verbal expression of feelings and emotions. Baseline date 09/10/22: Progress towards goal 0; How Often - Daily Target Date Goal Was reviewed Status Code Progress towards goal/Likert rating  09/10/23                            Goal 2) Increase self care and coping skills daily to manage stress to reduce anxiety and irritability AEB Pt report and therapist  observation. Baseline date 09/10/22: Progress towards goal 0; How Often - Daily Target Date Goal Was reviewed Status Code Progress towards goal  09/10/23                            This plan has been reviewed and created by the following participants:  This plan will be reviewed at least every 12 months. Date Behavioral Health Clinician Date Guardian/Patient   09/10/22          [facility] [name] [facility] 09/10/22 Verbal Consent Provided                         Forde Radon [facility]

## 2022-11-22 LAB — ENA+DNA/DS+SJORGEN'S
ENA RNP Ab: 0.2 AI (ref 0.0–0.9)
ENA SM Ab Ser-aCnc: 0.4 AI (ref 0.0–0.9)
ENA SSA (RO) Ab: <0.2 AI (ref 0.0–0.9)
ENA SSB (LA) Ab: 0.6 AI (ref 0.0–0.9)
dsDNA Ab: 1 IU/mL (ref 0–9)

## 2022-11-22 LAB — COMPREHENSIVE METABOLIC PANEL
ALT: 22 IU/L (ref 0–32)
AST: 18 IU/L (ref 0–40)
Albumin: 4.1 g/dL (ref 3.8–4.9)
Alkaline Phosphatase: 56 IU/L (ref 44–121)
BUN/Creatinine Ratio: 14 (ref 9–23)
BUN: 10 mg/dL (ref 6–24)
Bilirubin Total: 0.3 mg/dL (ref 0.0–1.2)
CO2: 19 mmol/L — ABNORMAL LOW (ref 20–29)
Calcium: 9.5 mg/dL (ref 8.7–10.2)
Chloride: 107 mmol/L — ABNORMAL HIGH (ref 96–106)
Creatinine, Ser: 0.74 mg/dL (ref 0.57–1.00)
Globulin, Total: 3.1 g/dL (ref 1.5–4.5)
Glucose: 94 mg/dL (ref 70–99)
Potassium: 4.1 mmol/L (ref 3.5–5.2)
Sodium: 142 mmol/L (ref 134–144)
Total Protein: 7.2 g/dL (ref 6.0–8.5)
eGFR: 97 mL/min/{1.73_m2} (ref >59)

## 2022-11-22 LAB — C1 ESTERASE INHIBITOR, FUNCTIONAL: C1INH Functional/C1INH Total MFr SerPl: 101 %mean normal

## 2022-11-22 LAB — ALPHA-GAL PANEL
Allergen Lamb IgE: <0.1 kU/L
Beef IgE: <0.1 kU/L
IgE (Immunoglobulin E), Serum: 207 IU/mL (ref 6–495)
O215-IgE Alpha-Gal: <0.1 kU/L
Pork IgE: <0.1 kU/L

## 2022-11-22 LAB — ANA W/REFLEX: Anti Nuclear Antibody (ANA): POSITIVE — AB

## 2022-11-22 LAB — C1 ESTERASE INHIBITOR: C1INH SerPl-mCnc: 29 mg/dL (ref 21–39)

## 2022-11-22 LAB — CBC WITH DIFFERENTIAL/PLATELET
Basophils Absolute: 0 10*3/uL (ref 0.0–0.2)
Basos: 1 %
EOS (ABSOLUTE): 0.2 10*3/uL (ref 0.0–0.4)
Eos: 2 %
Hematocrit: 40.2 % (ref 34.0–46.6)
Hemoglobin: 12.8 g/dL (ref 11.1–15.9)
Immature Grans (Abs): 0 10*3/uL (ref 0.0–0.1)
Immature Granulocytes: 0 %
Lymphocytes Absolute: 2.6 10*3/uL (ref 0.7–3.1)
Lymphs: 34 %
MCH: 26.8 pg (ref 26.6–33.0)
MCHC: 31.8 g/dL (ref 31.5–35.7)
MCV: 84 fL (ref 79–97)
Monocytes Absolute: 0.5 10*3/uL (ref 0.1–0.9)
Monocytes: 6 %
Neutrophils Absolute: 4.5 10*3/uL (ref 1.4–7.0)
Neutrophils: 57 %
Platelets: 359 10*3/uL (ref 150–450)
RBC: 4.77 x10E6/uL (ref 3.77–5.28)
RDW: 13.9 % (ref 11.7–15.4)
WBC: 7.9 10*3/uL (ref 3.4–10.8)

## 2022-11-22 LAB — CHRONIC URTICARIA: cu index: 8 (ref <10)

## 2022-11-22 LAB — THYROID CASCADE PROFILE: TSH: 1.52 u[IU]/mL (ref 0.450–4.500)

## 2022-11-22 LAB — C3 AND C4
Complement C3, Serum: 162 mg/dL (ref 82–167)
Complement C4, Serum: 39 mg/dL — ABNORMAL HIGH (ref 12–38)

## 2022-11-22 LAB — SEDIMENTATION RATE: Sed Rate: 15 mm/hr (ref 0–40)

## 2022-11-22 LAB — C-REACTIVE PROTEIN: CRP: 1 mg/L (ref 0–10)

## 2022-11-22 LAB — TRYPTASE: Tryptase: 5.2 ug/L (ref 2.2–13.2)

## 2022-11-25 ENCOUNTER — Other Ambulatory Visit (HOSPITAL_COMMUNITY): Payer: Self-pay

## 2022-11-25 ENCOUNTER — Other Ambulatory Visit: Payer: Self-pay

## 2022-11-25 ENCOUNTER — Ambulatory Visit: Payer: Commercial Managed Care - PPO | Admitting: Nurse Practitioner

## 2022-11-25 ENCOUNTER — Encounter: Payer: Self-pay | Admitting: Nurse Practitioner

## 2022-11-25 VITALS — BP 120/82 | HR 98 | Temp 98.3°F | Resp 16 | Ht 60.0 in | Wt 174.7 lb

## 2022-11-25 DIAGNOSIS — J4520 Mild intermittent asthma, uncomplicated: Secondary | ICD-10-CM | POA: Diagnosis not present

## 2022-11-25 DIAGNOSIS — C50412 Malignant neoplasm of upper-outer quadrant of left female breast: Secondary | ICD-10-CM

## 2022-11-25 DIAGNOSIS — J309 Allergic rhinitis, unspecified: Secondary | ICD-10-CM | POA: Diagnosis not present

## 2022-11-25 DIAGNOSIS — I10 Essential (primary) hypertension: Secondary | ICD-10-CM | POA: Diagnosis not present

## 2022-11-25 DIAGNOSIS — Z170 Estrogen receptor positive status [ER+]: Secondary | ICD-10-CM

## 2022-11-25 DIAGNOSIS — Z6833 Body mass index (BMI) 33.0-33.9, adult: Secondary | ICD-10-CM

## 2022-11-25 DIAGNOSIS — Z794 Long term (current) use of insulin: Secondary | ICD-10-CM

## 2022-11-25 DIAGNOSIS — E1165 Type 2 diabetes mellitus with hyperglycemia: Secondary | ICD-10-CM

## 2022-11-25 DIAGNOSIS — E6609 Other obesity due to excess calories: Secondary | ICD-10-CM

## 2022-11-25 DIAGNOSIS — E785 Hyperlipidemia, unspecified: Secondary | ICD-10-CM

## 2022-11-25 DIAGNOSIS — J4531 Mild persistent asthma with (acute) exacerbation: Secondary | ICD-10-CM | POA: Insufficient documentation

## 2022-11-25 LAB — POCT GLYCOSYLATED HEMOGLOBIN (HGB A1C): Hemoglobin A1C: 6 % — AB (ref 4.0–5.6)

## 2022-11-25 MED ORDER — LANCET DEVICE MISC
1.0000 | Freq: Three times a day (TID) | 0 refills | Status: AC
Start: 2022-11-25 — End: 2022-12-25
  Filled 2022-11-25: qty 1, 30d supply, fill #0

## 2022-11-25 MED ORDER — FREESTYLE LANCETS MISC
1.0000 | Freq: Three times a day (TID) | 0 refills | Status: DC
Start: 2022-11-25 — End: 2023-02-10
  Filled 2022-11-25: qty 100, 30d supply, fill #0

## 2022-11-25 MED ORDER — BLOOD GLUCOSE TEST VI STRP
1.0000 | ORAL_STRIP | Freq: Three times a day (TID) | 0 refills | Status: DC
Start: 2022-11-25 — End: 2023-02-10
  Filled 2022-11-25: qty 100, 30d supply, fill #0

## 2022-11-25 MED ORDER — BLOOD GLUCOSE METER KIT
1.0000 | PACK | Freq: Three times a day (TID) | 0 refills | Status: DC
Start: 2022-11-25 — End: 2023-02-10
  Filled 2022-11-25: qty 1, 30d supply, fill #0

## 2022-11-25 NOTE — Assessment & Plan Note (Signed)
Continue taking losartan 25 mg daily.  Recommend doing the DASH diet.  Increase physical activity as tolerated.

## 2022-11-25 NOTE — Assessment & Plan Note (Signed)
Patient is currently seeing an allergist.  Continue using albuterol Singulair Pataday DuoNeb Trelegy Flonase Astelin

## 2022-11-25 NOTE — Progress Notes (Signed)
BP 120/82   Pulse 98   Temp 98.3 F (36.8 C) (Oral)   Resp 16   Ht 5' (1.524 m)   Wt 174 lb 11.2 oz (79.2 kg)   LMP 11/15/2014   SpO2 98%   BMI 34.12 kg/m    Subjective:    Patient ID: Tracey Huff, female    DOB: Mar 12, 1971, 52 y.o.   MRN: [id number]  HPI: Tracey Huff is a 52 y.o. female  Chief Complaint  Patient presents with   Anxiety   Depression   anxiety Medication: buspar 7.5 mg BID Compliant yes as needed Side effects none PHQ9 negative GAD positive Therapy being seen at [facility].       11/25/2022    8:54 AM 05/10/2022    9:55 AM 11/07/2021    9:59 AM 05/09/2021    9:49 AM  GAD 7 : Generalized Anxiety Score  Nervous, Anxious, on Edge 1 0 2 0  Control/stop worrying 0 0 0 0  Worry too much - different things 1 0 1 0  Trouble relaxing 1 0 1 0  Restless 0 0 1 0  Easily annoyed or irritable 1 0 1 0  Afraid - awful might happen 0 0 1 0  Total GAD 7 Score 4 0 7 0  Anxiety Difficulty Somewhat difficult Not difficult at all Not difficult at all Not difficult at all        11/25/2022    8:53 AM 08/29/2022    3:07 PM 08/29/2022    9:33 AM 08/09/2022    8:51 AM 07/26/2022   11:37 AM  Depression screen PHQ 2/9  Decreased Interest 0 0 0 0 0  Down, Depressed, Hopeless 0 0 0 0 0  PHQ - 2 Score 0 0 0 0 0  Altered sleeping 0      Tired, decreased energy 0      Change in appetite 0      Feeling bad or failure about yourself  0      Trouble concentrating 0      Moving slowly or fidgety/restless 0      Suicidal thoughts 0      PHQ-9 Score 0      Difficult doing work/chores Not difficult at all         Breast ca: patient reports she was supposed to get her second reconstruction surgery on the 29 but had to postpone due to illness.  Patient had a double mastectomy and reconstruction done by Dr. Everlene Farrier.  Her oncologist is Dr. Donneta Romberg.  She last saw oncology on 04/17/2022.  She is currently taking tamoxifen.  No changes.    Carcinoma of upper-outer quadrant of left breast in female, estrogen receptor positive (HCC) #Left breast cancer T18mN0- G-2; ER/PR positive HER2 negative status BIL postmastectomy [positive for CHEK-2 mutation].  Oncotype recurrence score 16/LOW. On Tamoxifen [s/p hysterectomy [intact ovaries]; no role for mammograms.  Asthma/allergies:    -Asthma status: controlled, sees allergist every 6 months -Current Treatments: albuterol, singulair,  pataday, duoNeb, trelegy, flonase, astelin, getting weekly allergy shots -Satisfied with current treatment?: yes -Albuterol/rescue inhaler frequency:  -Dyspnea frequency:  -Wheezing frequency: -Cough frequency:  -Nocturnal symptom frequency:  -Limitation of activity: no -Current upper respiratory symptoms: no -Triggers:  -Aerochamber/spacer use: no -Visits to ER or Urgent Care in past year: no -Influenza: Up to Date   Hypertension:  -Medications: losartan 25 mg daily -Patient is compliant with above medications and reports no side  effects. -Checking BP at home (average): 130s -Denies any SOB, CP, vision changes, LE edema or symptoms of hypotension -Diet: recommend dash diet -Exercise: increase physical activity as tolerated   HLD:  -Medications: atorvastatin 10 mg daily -Patient is compliant with above medications and reports no side effects.  -Last lipid panel:  Lipid Panel     Component Value Date/Time   CHOL 237 (H) 05/10/2022 1036   TRIG 117 05/10/2022 1036   HDL 61 05/10/2022 1036   CHOLHDL 3.9 05/10/2022 1036   LDLCALC 153 (H) 05/10/2022 1036   LDLDIRECT 180 (H) 11/08/2020 1105      The 10-year ASCVD risk score (Arnett DK, et al., 2019) is: 7.5%   Values used to calculate the score:     Age: 58 years     Sex: Female     Is Non-Hispanic African American: Yes     Diabetic: Yes     Tobacco smoker: No     Systolic Blood Pressure: 120 mmHg     Is BP treated: Yes     HDL Cholesterol: 61 mg/dL     Total Cholesterol: 237  mg/dL   Diabetes, Type 2:  -Last A1c 6.8, today 6.0 -Medications:ozempic -Checking BG at home: no -Diet: trying to reduce sugar intake -Exercise: does walking -Eye exam: due -Foot exam: utd -Microalbumin: utd -Statin: yes -Denies symptoms of hypoglycemia, polyuria, polydipsia, numbness extremities, foot ulcers/trauma.    Obesity:  Current weight : 174 lbs BMI: 34.12 Previous weight:178 lbs Treatment Tried: life style modification Comorbidities: DM, HTN, HLD    Relevant past medical, surgical, family and social history reviewed and updated as indicated. Interim medical history since our last visit reviewed. Allergies and medications reviewed and updated.  Review of Systems Constitutional: Negative for fever or weight change.  Respiratory: Negative for cough and shortness of breath.   Cardiovascular: Negative for chest pain or palpitations.  Gastrointestinal: Negative for abdominal pain, no bowel changes.  Musculoskeletal: Negative for gait problem or joint swelling.  Skin: Negative for rash.  Neurological: Negative for dizziness or headache.  No other specific complaints in a complete review of systems (except as listed in HPI above).      Objective:    BP 120/82   Pulse 98   Temp 98.3 F (36.8 C) (Oral)   Resp 16   Ht 5' (1.524 m)   Wt 174 lb 11.2 oz (79.2 kg)   LMP 11/15/2014   SpO2 98%   BMI 34.12 kg/m   Wt Readings from Last 3 Encounters:  11/25/22 174 lb 11.2 oz (79.2 kg)  11/13/22 174 lb 1.6 oz (79 kg)  08/31/22 177 lb 14.6 oz (80.7 kg)    Physical Exam  Constitutional: Patient appears well-developed and well-nourished. Obese  No distress.  HEENT: head atraumatic, normocephalic, pupils equal and reactive to light, neck supple, throat within normal limits Cardiovascular: Normal rate, regular rhythm and normal heart sounds.  No murmur heard. No BLE edema. Pulmonary/Chest: Effort normal and breath sounds normal. No respiratory distress. Abdominal: Soft.   There is no tenderness. Psychiatric: Patient has a normal mood and affect. behavior is normal. Judgment and thought content normal.  Results for orders placed or performed in visit on 11/25/22  POCT glycosylated hemoglobin (Hb A1C)  Result Value Ref Range   Hemoglobin A1C 6.0 (A) 4.0 - 5.6 %   HbA1c POC (<> result, manual entry)     HbA1c, POC (prediabetic range)     HbA1c, POC (controlled diabetic range)  Assessment & Plan:   Problem List Items Addressed This Visit       Cardiovascular and Mediastinum   Essential hypertension    Continue taking losartan 25 mg daily.  Recommend doing the DASH diet.  Increase physical activity as tolerated.        Respiratory   Allergic rhinitis    Patient is currently seeing an allergist.  Continue using albuterol Singulair Pataday DuoNeb Trelegy Flonase Astelin       Mild persistent asthma with exacerbation    Patient is currently seeing an allergist.  Continue using albuterol Singulair Pataday DuoNeb Trelegy Flonase Astelin         Endocrine   Type 2 diabetes mellitus with hyperglycemia, with long-term current use of insulin (HCC) - Primary    Taking Ozempic 0.25 mg weekly.      Relevant Medications   blood glucose meter kit and supplies   Glucose Blood (BLOOD GLUCOSE TEST STRIPS) STRP   Lancet Device MISC   Lancets (FREESTYLE) lancets   Other Relevant Orders   POCT glycosylated hemoglobin (Hb A1C) (Completed)     Other   Class 1 obesity due to excess calories with serious comorbidity and body mass index (BMI) of 33.0 to 33.9 in adult    On Ozempic.  She is also working on lifestyle modification.      Hyperlipidemia    Continue atorvastatin 10 mg daily.      Carcinoma of upper-outer quadrant of left breast in female, estrogen receptor positive (HCC)    Continue to follow-up with oncology and surgery.         Follow up plan: Return in about 6 months (around 05/28/2023) for follow up.

## 2022-11-25 NOTE — Assessment & Plan Note (Signed)
Taking Ozempic 0.25 mg weekly.

## 2022-11-25 NOTE — Assessment & Plan Note (Signed)
On Ozempic.  She is also working on lifestyle modification.

## 2022-11-25 NOTE — Assessment & Plan Note (Signed)
Continue atorvastatin 10 mg daily. 

## 2022-11-25 NOTE — Assessment & Plan Note (Signed)
Continue to follow-up with oncology and surgery.

## 2022-11-26 ENCOUNTER — Encounter: Payer: Self-pay | Admitting: Internal Medicine

## 2022-11-26 ENCOUNTER — Inpatient Hospital Stay: Payer: Commercial Managed Care - PPO | Attending: Internal Medicine | Admitting: Internal Medicine

## 2022-11-26 ENCOUNTER — Other Ambulatory Visit (HOSPITAL_COMMUNITY): Payer: Self-pay

## 2022-11-26 DIAGNOSIS — Z170 Estrogen receptor positive status [ER+]: Secondary | ICD-10-CM | POA: Insufficient documentation

## 2022-11-26 DIAGNOSIS — Z79810 Long term (current) use of selective estrogen receptor modulators (SERMs): Secondary | ICD-10-CM | POA: Diagnosis not present

## 2022-11-26 DIAGNOSIS — C50412 Malignant neoplasm of upper-outer quadrant of left female breast: Secondary | ICD-10-CM | POA: Diagnosis not present

## 2022-11-26 MED ORDER — TAMOXIFEN CITRATE 20 MG PO TABS
20.0000 mg | ORAL_TABLET | Freq: Every day | ORAL | 1 refills | Status: DC
  Filled 2022-11-26: qty 90, 90d supply, fill #0
  Filled 2023-02-24: qty 90, 90d supply, fill #1

## 2022-11-26 MED ORDER — VENLAFAXINE HCL ER 75 MG PO CP24
75.0000 mg | ORAL_CAPSULE | Freq: Every day | ORAL | 6 refills | Status: DC
  Filled 2022-11-26: qty 30, 30d supply, fill #0

## 2022-11-26 NOTE — Progress Notes (Signed)
Pt having allergy to something, undetermined cause. Has been to allergy specialist. Breast reconstruction completed. Denies any breast pain.Having hot flashes, denies joint pain. 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

## 2022-11-26 NOTE — Progress Notes (Signed)
[facility] CONSULT NOTE  Patient Care Team: Berniece Salines, FNP as PCP - General (Nurse Practitioner) Benita Gutter, RN as Oncology Nurse Navigator Scarlett Presto, RN (Inactive) as Oncology Nurse Navigator Pabon, Merri Ray, MD as Consulting Physician (General Surgery) Earna Coder, MD as Consulting Physician (Oncology) Allena Napoleon, MD as Consulting Physician (Plastic Surgery)  CHIEF COMPLAINTS/PURPOSE OF CONSULTATION: Breast cancer   Oncology History Overview Note  # On physical exam, I palpate a focal firm 2 x 5 cm masslike area over the 11 to 1:30 position of the left breast approximately 2-4 cm from the nipple.   Targeted ultrasound is performed, showing no discrete focal abnormality over the upper mid to outer left breast. There is a pattern of dense fibroglandular tissue over this area which appears slightly different from the dense fibroglandular tissue elsewhere in the left breast. There is hazy decreased echogenicity with subtle shadowing within this dense tissue at the 12 to 1 o'clock position. Some of this vague hazy decreased echogenicity with shadowing is present at the 12 o'clock position 4 cm from the nipple more focal with harmonics and measures approximately 1.4 x 1.6 x 2 cm. There is another more focal area of decreased echogenicity and shadowing at the 1 o'clock position of the left breast 2 cm from the nipple measuring 1.4 x 1.5 x 1.5 cm. These 2 areas appear to be contiguous and are likely part of the same process.  1.4 x 1.6 x 2 cm. There is another more focal area of decreased echogenicity and shadowing at the 1 o'clock position of the left breast 2 cm from the nipple measuring 1.4 x 1.5 x 1.5 cm. These 2 areas appear to be contiguous and are likely part of the same process.  DIAGNOSIS:  A. 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   Size of invasive carcinoma: 2 mm  in this sample  Histologic grade of invasive carcinoma: Grade 2                       Glandular/tubular differentiation score: 3                       Nuclear pleomorphism score: 2                       Mitotic rate score: 1                       Total score: 6  Ductal carcinoma in situ: Present, intermediate grade  Lymphovascular invasion: Not identified    Ultrasound the left axilla is normal. CASE SUMMARY: BREAST BIOMARKER TESTS  Estrogen Receptor (ER) Status: POSITIVE          Percentage of cells with nuclear positivity: 90-100%          Average intensity of staining: Strong   Progesterone Receptor (PgR) Status: POSITIVE          Percentage of cells with nuclear positivity: 90-100%          Average intensity of staining: Strong   HER2 (by immunohistochemistry): NEGATIVE (Score 1+)  Ki-67: Not performed  Procedure: Simple mastectomy  Specimen Laterality: Left (bilateral mastectomy, tumor in left)   TUMOR  Histologic Type: Invasive mammary carcinoma, no special type  Histologic Grade (Nottingham Histologic Score)  Glandular (Acinar)/Tubular Differentiation: 2                       Nuclear Pleomorphism: 2                       Mitotic Rate: 2                       Overall Grade: 2  Tumor Size: 10 mm  Ductal Carcinoma In Situ (DCIS): Present, with extensive intraductal  component  Lymphovascular Invasion: Not identified  Treatment Effect in the Breast: No known presurgical treatment   MARGINS  Margin Status for Invasive Carcinoma: All margins negative for invasive  carcinoma                       Distance from invasive carcinoma to closest margin:  1.2 cm, posterior   Margin Status for DCIS: Margin(s) Involved by DCIS: All margins negative  for DCIS                       Distance from DCIS to closest margin: 1.2 cm,  posterior   REGIONAL LYMPH NODES  Regional Lymph Node Status: All regional lymph nodes negative for tumor                        Total Number of Lymph Nodes Examined (sentinel and  non-sentinel): 3                       Number of Sentinel Nodes Examined: 3   DISTANT METASTASIS  Distant Site(s) Involved, if applicable: Not applicable   PATHOLOGIC STAGE CLASSIFICATION (pTNM, AJCC 8th Edition):  TNM Descriptors: M (multifocal)  pT Category: pT1b  Regional Lymph Nodes Modifier: sn  pN Category: pN0  pM Category: Not applicable   SPECIAL STUDIES  Breast Biomarker Testing Performed on Previous Biopsy: [id number]   Estrogen Receptor (ER) Status: POSITIVE  Progesterone Receptor (PgR) Status: POSITIVE  HER2 (by immunohistochemistry): NEGATIVE (Score 1+)   #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  #June 19, 2021-tamoxifen 20 mg a day [ estrogen/LH-?  Postmenopausal].   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 VUS in RAD51D called c.434G> and in [facility] called c.476C>T also identified. The final report date is 05/09/2020.  The CancerNext-Expanded + RNAinsight gene panel offered by [employer] and includes sequencing and rearrangement analysis for the following 77 genes: IP, ALK, APC*, ATM*, AXIN2, BAP1, BARD1, BLM, BMPR1A, BRCA1*, BRCA2*, BRIP1*, CDC73, CDH1*,CDK4, CDKN1B, CDKN2A, CHEK2*, CTNNA1, DICER1, FANCC, FH, FLCN, GALNT12, KIF1B, LZTR1, MAX, MEN1, MET, MLH1*, MSH2*, MSH3, MSH6*, MUTYH*, NBN, NF1*, NF2, NTHL1, PALB2*, PHOX2B, PMS2*, POT1, PRKAR1A, PTCH1, PTEN*, RAD51C*, RAD51D*,RB1, RECQL, RET, SDHA, SDHAF2, SDHB, SDHC, SDHD,  SMAD4, SMARCA4, SMARCB1, SMARCE1, STK11, SUFU, TMEM127, TP53*,TSC1, TSC2, VHL and XRCC2 (sequencing and deletion/duplication); EGFR, EGLN1, HOXB13, KIT, MITF, PDGFRA, POLD1 and POLE (sequencing only); EPCAM and GREM1 (deletion/duplication only).    05/29/2021 Cancer Staging   Staging form:  Breast, AJCC 8th Edition - Pathologic: pT1b, pN0, cM0, ER+, PR+, HER2- - Signed by Earna Coder, MD on 05/29/2021 Nuclear grade: G2    HISTORY OF PRESENTING ILLNESS: Alone.  Ambulating independently.  Tracey Huff 52 y.o.  female patient with stage I ER/PR positive HER2 negative left breast cancer  [CHEK2 mutant] s/p bilateral mastectomies on tamoxifen is here for follow-up.  Patient complains of continued hot flashes which are quite intense.  Currently patient has going through significant social stress at home.  Her husband is currently being considered for hospice for his brain tumor.  Denies any breast pain.Denies joint pain. 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   Review of Systems  Constitutional:  Negative for chills, diaphoresis, fever, malaise/fatigue and weight loss.  HENT:  Negative for nosebleeds and sore throat.   Eyes:  Negative for double vision.  Respiratory:  Negative for cough, hemoptysis, sputum production, shortness of breath and wheezing.   Cardiovascular:  Negative for chest pain, palpitations, orthopnea and leg swelling.  Gastrointestinal:  Negative for abdominal pain, blood in stool, constipation, diarrhea, heartburn, melena, nausea and vomiting.  Genitourinary:  Negative for dysuria, frequency and urgency.  Musculoskeletal:  Negative for back pain and joint pain.  Skin: Negative.  Negative for itching and rash.  Neurological:  Negative for dizziness, tingling, focal weakness, weakness and headaches.  Endo/Heme/Allergies:  Does not bruise/bleed easily.  Psychiatric/Behavioral:  Negative for depression. The patient is not nervous/anxious and does not  have insomnia.      MEDICAL HISTORY:  Past Medical History:  Diagnosis Date   Allergy    Anxiety    Asthma    Breast cancer (HCC)    INVASIVE MAMMARY CARCINOMA- left   Family history of prostate cancer    Fibroid uterus 11/15/2014   Hyperlipidemia    Hypertension    Menorrhagia with irregular cycle 11/15/2014   Pre-diabetes    Urticaria     SURGICAL HISTORY: Past Surgical History:  Procedure Laterality Date   ABDOMINAL HYSTERECTOMY N/A 11/15/2014   Procedure: HYSTERECTOMY ABDOMINAL/BILATERAL SALPINGECTOMY ;  Surgeon: Conard Novak, MD;  Location: [facility];  Service: Gynecology;  Laterality: N/A;   BREAST BIOPSY Right    2019 negative   BREAST BIOPSY Left 03/09/2021   u/s biopsy, 12-1 o'clock, VENUS" clip-path pending   BREAST LUMPECTOMY,RADIO FREQ LOCALIZER,AXILLARY SENTINEL LYMPH NODE BIOPSY Left 03/28/2021   Procedure: BREAST LUMPECTOMY,RADIO FREQ LOCALIZER,AXILLARY SENTINEL LYMPH NODE BIOPSY;  Surgeon: Leafy Ro, MD;  Location: [facility];  Service: General;  Laterality: Left;   BREAST RECONSTRUCTION WITH PLACEMENT OF TISSUE EXPANDER AND FLEX HD (ACELLULAR HYDRATED DERMIS) Bilateral 05/15/2021   Procedure: BILATERAL BREAST RECONSTRUCTION WITH PLACEMENT OF TISSUE EXPANDER AND FLEX HD (ACELLULAR HYDRATED DERMIS);  Surgeon: Allena Napoleon, MD;  Location: [facility];  Service: Plastics;  Laterality: Bilateral;   CESAREAN SECTION  1990   COLONOSCOPY WITH PROPOFOL N/A 01/04/2022   Procedure: COLONOSCOPY WITH PROPOFOL;  Surgeon: Wyline Mood, MD;  Location: [facility];  Service: Gastroenterology;  Laterality: N/A;   COLPOSCOPY  07/28/2018   CYSTOSCOPY N/A 11/15/2014   Procedure: CYSTOSCOPY;  Surgeon: Conard Novak, MD;  Location: [facility];  Service: Gynecology;  Laterality: N/A;   DILATION AND CURETTAGE OF UTERUS     GANGLION CYST EXCISION Left    REMOVAL OF BILATERAL TISSUE EXPANDERS WITH PLACEMENT OF BILATERAL BREAST IMPLANTS Bilateral 10/02/2021   Procedure: REMOVAL  OF BILATERAL TISSUE EXPANDERS WITH PLACEMENT OF BILATERAL BREAST IMPLANTS;  Surgeon:  Allena Napoleon, MD;  Location: [facility];  Service: Plastics;  Laterality: Bilateral;   TOTAL MASTECTOMY Bilateral 05/15/2021   Procedure: TOTAL MASTECTOMY, Bilateral Simple Mastectomy, skin sparing;  Surgeon: Leafy Ro, MD;  Location: [facility];  Service: General;  Laterality: Bilateral;  Provider requesting for 2 hours / 120 min for procedure.   UTERINE FIBROID SURGERY      SOCIAL HISTORY: Social History   Socioeconomic History   Marital status: Married    Spouse name: Peyton Najjar   Number of children: 2   Years of education: Not on file   Highest education level: Not on file  Occupational History   Not on file  Tobacco Use   Smoking status: Never    Passive exposure: Never   Smokeless tobacco: Never  Vaping Use   Vaping status: Never Used  Substance and Sexual Activity   Alcohol use: Not Currently    Comment: occassional   Drug use: No   Sexual activity: Yes    Partners: Male    Birth control/protection: Surgical  Other Topics Concern   Not on file  Social History Narrative   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 Lives with husband.    Social Determinants of Health   Financial Resource Strain: Low Risk  (08/09/2022)   Overall Financial Resource Strain (CARDIA)    Difficulty of Paying Living Expenses: Not hard at all  Food Insecurity: No Food Insecurity (08/09/2022)   Hunger Vital Sign    Worried About Running Out of Food in the Last Year: Never true    Ran Out of Food in the Last Year: Never true  Transportation Needs: No Transportation Needs (08/09/2022)   PRAPARE - Administrator, Civil Service (Medical): No    Lack of Transportation (Non-Medical): No  Physical Activity: Insufficiently Active (08/09/2022)   Exercise Vital Sign    Days of Exercise per Week: 2 days    Minutes of Exercise per Session: 30 min   Stress: No Stress Concern Present (08/09/2022)   [employer] of Occupational Health - Occupational Stress Questionnaire    Feeling of Stress : Only a little  Social Connections: Socially Integrated (08/09/2022)   Social Connection and Isolation Panel [NHANES]    Frequency of Communication with Friends and Family: More than three times a week    Frequency of Social Gatherings with Friends and Family: More than three times a week    Attends Religious Services: More than 4 times per year    Active Member of [employer] or Organizations: No    Attends Engineer, structural: More than 4 times per year    Marital Status: Married  Catering manager Violence: Not At Risk (08/09/2022)   Humiliation, Afraid, Rape, and Kick questionnaire    Fear of Current or Ex-Partner: No    Emotionally Abused: No    Physically Abused: No    Sexually Abused: No    FAMILY HISTORY: Family History  Problem Relation Age of Onset   Allergic rhinitis Mother    Congestive Heart Failure Mother    Diabetes Mother    Hypertension Mother    Diabetes Maternal Grandmother    Congestive Heart Failure Maternal Grandfather    Breast cancer Neg Hx     ALLERGIES:  is allergic to macadamia nut oil, apple juice, [place] nut (berthollefia excelsa), carrot oil, fruit & vegetable daily [nutritional supplements], kiwi extract, and peach [prunus persica].  MEDICATIONS:  Current Outpatient Medications  Medication Sig Dispense Refill   albuterol (PROVENTIL HFA) 108 (90 Base) MCG/ACT inhaler Inhale 1-2 puffs into the lungs every 4-6 hours as needed for cough/wheeze 6.7 g 0   Ascorbic Acid (VITAMIN C) 100 MG tablet Take 100 mg by mouth daily.     atorvastatin (LIPITOR) 10 MG tablet Take 1 tablet (10 mg total) by mouth daily. Bedtime 90 tablet 1   AUVI-Q 0.3 MG/0.3ML SOAJ injection Inject 0.3 mg into the muscle as needed for anaphylaxis.     azelastine (ASTELIN) 0.1 % nasal spray Place 1-2 sprays into both nostrils 2 (two) times  daily. 30 mL 6   blood glucose meter kit and supplies Use to check blood sugar in the morning, at noon, and at bedtime. 1 each 0   busPIRone (BUSPAR) 7.5 MG tablet Take 1 tablet (7.5 mg total) by mouth 2 (two) times daily as needed (anxiety). 60 tablet 1   diphenhydrAMINE (BENADRYL) 2 % cream Apply 1 application topically 2 (two) times daily as needed for itching.     Docosanol 10 % CREA Apply 1 Application topically every 4 (four) hours. 2 g 0   ELDERBERRY PO Take 1 capsule by mouth daily.     ezetimibe (ZETIA) 10 MG tablet Take 1 tablet (10 mg total) by mouth daily. 90 tablet 3   fluticasone (FLONASE) 50 MCG/ACT nasal spray Place 1-2 sprays into both nostrils daily. 16 g 6   fluticasone (FLONASE) 50 MCG/ACT nasal spray Place 1-2 sprays into both nostrils daily. 16 g 6   Fluticasone-Umeclidin-Vilant (TRELEGY ELLIPTA) 200-62.5-25 MCG/ACT AEPB Inhale 1 puff into the lungs daily. 60 each 6   Glucose Blood (BLOOD GLUCOSE TEST STRIPS) STRP Use to check blood sugar each in the morning, at noon, and at bedtime. 100 strip 0   hydrOXYzine (VISTARIL) 25 MG capsule TAKE 1 CAPSULE (25 MG TOTAL) BY MOUTH AT BEDTIME AS NEEDED FOR UP TO 30 DOSES (DIFFICULTY SLEEPING). 30 capsule 1   ipratropium-albuterol (DUONEB) 0.5-2.5 (3) MG/3ML SOLN Take 3 mLs by nebulization every 6 (six) hours as needed (wheeze, SOB, cough variant asthma). 360 mL 1   Lancet Device MISC use in the morning, at noon, and at bedtime. May substitute to any manufacturer covered by patient's insurance. 1 each 0   Lancets (FREESTYLE) lancets Use to check blood sugar in the morning, at noon, and at bedtime. 100 each 0   levocetirizine (XYZAL) 5 MG tablet Take 1 tablet (5 mg total) by mouth every evening. 30 tablet 6   losartan (COZAAR) 25 MG tablet Take 1 tablet (25 mg total) by mouth every morning. 90 tablet 1   montelukast (SINGULAIR) 10 MG tablet Take 1 tablet (10 mg total) by mouth every evening. 30 tablet 6   montelukast (SINGULAIR) 10 MG  tablet Take 1 tablet (10 mg total) by mouth every evening. 30 tablet 6   mupirocin ointment (BACTROBAN) 2 % Apply 1 Application topically 2 (two) times daily. 22 g 0   mupirocin ointment (BACTROBAN) 2 % Apply to skin qd-bid 22 g 0   Olopatadine HCl (PATADAY) 0.2 % SOLN Instill 1 drop into affected eye once daily 2.5 mL 6   Ruxolitinib Phosphate (OPZELURA) 1.5 % CREA Apply 1 application topically as directed 1- 2  times daily. 60 g 1   Semaglutide,0.25 or 0.5MG /DOS, (OZEMPIC, 0.25 OR 0.5 MG/DOSE,) 2 MG/3ML SOPN Inject 0.25 mg into the skin once a week. 9 mL 0   venlafaxine XR (EFFEXOR-XR) 75  MG 24 hr capsule Take 1 capsule (75 mg total) by mouth daily with breakfast. 30 capsule 6   zolpidem (AMBIEN) 5 MG tablet Take 1 tablet (5 mg total) by mouth at bedtime as needed for sleep. 30 tablet 0   hydrocortisone cream 1 % Apply 1 application topically daily as needed for itching.     levocetirizine (XYZAL) 5 MG tablet Take 1 tablet (5 mg total) by mouth every evening. 30 tablet 6   tamoxifen (NOLVADEX) 20 MG tablet Take 1 tablet (20 mg total) by mouth daily. 90 tablet 1   No current facility-administered medications for this visit.      Marland Kitchen  PHYSICAL EXAMINATION: ECOG PERFORMANCE STATUS: 0 - Asymptomatic  Vitals:   11/26/22 0907  BP: (!) 130/90  Pulse: 60  Resp: 18  Temp: 99 F (37.2 C)  SpO2: 98%   Filed Weights   11/26/22 0907  Weight: 173 lb 9.6 oz (78.7 kg)   Physical Exam Vitals and nursing note reviewed.  HENT:     Head: Normocephalic and atraumatic.     Mouth/Throat:     Pharynx: Oropharynx is clear.  Eyes:     Extraocular Movements: Extraocular movements intact.     Pupils: Pupils are equal, round, and reactive to light.  Cardiovascular:     Rate and Rhythm: Normal rate and regular rhythm.  Pulmonary:     Comments: Decreased breath sounds bilaterally.  Abdominal:     Palpations: Abdomen is soft.  Musculoskeletal:        General: Normal range of motion.     Cervical  back: Normal range of motion.  Skin:    General: Skin is warm.  Neurological:     General: No focal deficit present.     Mental Status: She is alert and oriented to person, place, and time.  Psychiatric:        Behavior: Behavior normal.        Judgment: Judgment normal.      LABORATORY DATA:  I have reviewed the data as listed Lab Results  Component Value Date   WBC 7.9 11/13/2022   HGB 12.8 11/13/2022   HCT 40.2 11/13/2022   MCV 84 11/13/2022   PLT 359 11/13/2022   Recent Labs    05/10/22 1036 11/13/22 1056  NA 140 142  K 4.2 4.1  CL 104 107*  CO2 23 19*  GLUCOSE 105* 94  BUN 12 10  CREATININE 0.83 0.74  CALCIUM 9.3 9.5  PROT 7.4 7.2  ALBUMIN  --  4.1  AST 13 18  ALT 23 22  ALKPHOS  --  56  BILITOT 0.3 0.3    RADIOGRAPHIC STUDIES: I have personally reviewed the radiological images as listed and agreed with the findings in the report. No results found.  ASSESSMENT & PLAN:   Carcinoma of upper-outer quadrant of left breast in female, estrogen receptor positive (HCC) #Left breast cancer T30mN0- G-2; ER/PR positive HER2 negative status BIL postmastectomy [positive for CHEK-2 mutation].  Oncotype recurrence score 16/LOW. On Tamoxifen [s/p hysterectomy [intact ovaries]; no role for mammograms.  Stable. Sent Tam refills.   #Patient tolerated tamoxifen fairly well except for hot flashes. Stable.    # Hot flashes-grade  2-3-recommend pharmacologic therapy- Effexor.   # Cancer screening: colonoscopy [sep [email or web address] ]  # DISPOSITION:  # follow up 6 months- MD; no labs- Dr.B    All questions were answered. The patient/family knows to call the clinic with any problems, questions or concerns.  Earna Coder, MD 11/26/2022 10:14 AM

## 2022-11-26 NOTE — Assessment & Plan Note (Addendum)
#  Left breast cancer T31mN0- G-2; ER/PR positive HER2 negative status BIL postmastectomy [positive for CHEK-2 mutation].  Oncotype recurrence score 16/LOW. On Tamoxifen [s/p hysterectomy [intact ovaries]; no role for mammograms.  Stable. Sent Tam refills.   #Patient tolerated tamoxifen fairly well except for hot flashes. Stable.    # Hot flashes-grade  2-3-recommend pharmacologic therapy- Effexor.   # Cancer screening: colonoscopy [sep [email or web address] ]  # DISPOSITION:  # follow up 6 months- MD; no labs- Dr.B

## 2022-11-27 ENCOUNTER — Ambulatory Visit (INDEPENDENT_AMBULATORY_CARE_PROVIDER_SITE_OTHER): Payer: Commercial Managed Care - PPO | Admitting: Dermatology

## 2022-11-27 ENCOUNTER — Encounter: Payer: Self-pay | Admitting: Dermatology

## 2022-11-27 VITALS — BP 163/87 | HR 84

## 2022-11-27 DIAGNOSIS — L820 Inflamed seborrheic keratosis: Secondary | ICD-10-CM | POA: Diagnosis not present

## 2022-11-27 DIAGNOSIS — Z79899 Other long term (current) drug therapy: Secondary | ICD-10-CM

## 2022-11-27 DIAGNOSIS — K130 Diseases of lips: Secondary | ICD-10-CM | POA: Diagnosis not present

## 2022-11-27 DIAGNOSIS — R21 Rash and other nonspecific skin eruption: Secondary | ICD-10-CM

## 2022-11-27 NOTE — Progress Notes (Signed)
   Follow-Up Visit   Subjective  Tracey Huff is a 52 y.o. female who presents for the following: 3 month isk follow up. Patient reports still a few irritated spots at face she would like treated.  The patient has spots, moles and lesions to be evaluated, some may be new or changing and the patient may have concern these could be cancer.  The following portions of the chart were reviewed this encounter and updated as appropriate: medications, allergies, medical history  Review of Systems:  No other skin or systemic complaints except as noted in HPI or Assessment and Plan.  Objective  Well appearing patient in no apparent distress; mood and affect are within normal limits. A focused examination was performed of the following areas: Face , lips Relevant exam findings are noted in the Assessment and Plan.  Right temple x 1, left cheek x 1, left lateral nose x 1 (3) Erythematous stuck-on, waxy papule or plaque   Assessment & Plan   Rash - allergic contact dermatitis likely due to foods/spices/peppers Causing Cheilitis Chronic and persistent condition with duration or expected duration over one year. Condition is bothersome/symptomatic for patient. Currently flared.   Exam: slight swelling and hyperpigmentation  Differential diagnosis:  Allergic contact dermatitis likely to foods/spices/peppers   Treatment Plan: History of patch testing , prick testing, and blood work  History of patch testing showing allergy to nickel.  History of  allergy testing prior that showed positive allergy  to trees, grass, weed, ragweed, mold, dust mites, cockroach, dog, cat, feathers. Negative to walnut, peanut, pecan, cashew, almond, [place] nut, wheat, milk, egg, trout, oyster, shrimp, flounder, tuna, clam and catfish, crab and soy. 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  Patient seen by allergist Dr. Selena Batten at [facility] on 11/13/2022 and here were her recommendations   - Did not recommend RAST testing  - Recommended to avoid spicy foods and acidic foods as those tend to irritate sensitive skin. - Recommended to take pictures when rash/swelling - Recommended to start Xyzal 5 mg at night to help with itching  - Recommend Avoiding the following potential triggers: alcohol, tight clothing, NSAIDs, hot showers and getting overheated. See below for proper skin care.  Use fragrance free and dye free products. Don't use mint products.  Try kids flouride free toothpaste.  - Recommended to get blood work to rule out etiologies  My recommendations today are to :  Continue Opzelura cream to aa's BID.  Continue Xyzal 5 mg  po qhs as prescribed  Consider RAST testing in future if Allergist feels appropriate.  Inflamed seborrheic keratosis (3) Right temple x 1, left cheek x 1, left lateral nose x 1 Symptomatic, irritating, patient would like treated. Destruction of lesion - Right temple x 1, left cheek x 1, left lateral nose x 1 (3) Complexity: simple   Destruction method comment:  Electrodesiccation Timeout:  patient name, date of birth, surgical site, and procedure verified Outcome: patient tolerated procedure well with no complications    Return for 4 month isk and cheilitis .  IAsher Muir, CMA, am acting as scribe for Armida Sans, MD.  Documentation: I have reviewed the above documentation for accuracy and completeness, and I agree with the above.  Armida Sans, MD

## 2022-11-27 NOTE — Patient Instructions (Addendum)
Seborrheic Keratosis  What causes seborrheic keratoses? Seborrheic keratoses are harmless, common skin growths that first appear during adult life.  As time goes by, more growths appear.  Some people may develop a large number of them.  Seborrheic keratoses appear on both covered and uncovered body parts.  They are not caused by sunlight.  The tendency to develop seborrheic keratoses can be inherited.  They vary in color from skin-colored to gray, brown, or even black.  They can be either smooth or have a rough, warty surface.   Seborrheic keratoses are superficial and look as if they were stuck on the skin.  Under the microscope this type of keratosis looks like layers upon layers of skin.  That is why at times the top layer may seem to fall off, but the rest of the growth remains and re-grows.    Treatment Seborrheic keratoses do not need to be treated, but can easily be removed in the office.  Seborrheic keratoses often cause symptoms when they rub on clothing or jewelry.  Lesions can be in the way of shaving.  If they become inflamed, they can cause itching, soreness, or burning.  Removal of a seborrheic keratosis can be accomplished by freezing, burning, or surgery. If any spot bleeds, scabs, or grows rapidly, please return to have it checked, as these can be an indication of a skin cancer.  Due to recent changes in healthcare laws, you may see results of your pathology and/or laboratory studies on MyChart before the doctors have had a chance to review them. We understand that in some cases there may be results that are confusing or concerning to you. Please understand that not all results are received at the same time and often the doctors may need to interpret multiple results in order to provide you with the best plan of care or course of treatment. Therefore, we ask that you please give us 2 business days to thoroughly review all your results before contacting the office for clarification. Should  we see a critical lab result, you will be contacted sooner.   If You Need Anything After Your Visit  If you have any questions or concerns for your doctor, please call our main line at [phone] and press option 4 to reach your doctor's medical assistant. If no one answers, please leave a voicemail as directed and we will return your call as soon as possible. Messages left after 4 pm will be answered the following business day.   You may also send us a message via MyChart. We typically respond to MyChart messages within 1-2 business days.  For prescription refills, please ask your pharmacy to contact our office. Our fax number is [phone].  If you have an urgent issue when the clinic is closed that cannot wait until the next business day, you can page your doctor at the number below.    Please note that while we do our best to be available for urgent issues outside of office hours, we are not available 24/7.   If you have an urgent issue and are unable to reach us, you may choose to seek medical care at your doctor's office, retail clinic, urgent care center, or emergency room.  If you have a medical emergency, please immediately call 911 or go to the emergency department.  Pager Numbers  - Dr. Kowalski: [phone]  - Dr. Moye: [phone]  - Dr. Stewart: [phone]  In the event of inclement weather, please call our main line at   [phone] for an update on the status of any delays or closures.  Dermatology Medication Tips: Please keep the boxes that topical medications come in in order to help keep track of the instructions about where and how to use these. Pharmacies typically print the medication instructions only on the boxes and not directly on the medication tubes.   If your medication is too expensive, please contact our office at [phone] option 4 or send us a message through MyChart.   We are unable to tell what your co-pay for medications will be in  advance as this is different depending on your insurance coverage. However, we may be able to find a substitute medication at lower cost or fill out paperwork to get insurance to cover a needed medication.   If a prior authorization is required to get your medication covered by your insurance company, please allow us 1-2 business days to complete this process.  Drug prices often vary depending on where the prescription is filled and some pharmacies may offer cheaper prices.  The website www.goodrx.com contains coupons for medications through different pharmacies. The prices here do not account for what the cost may be with help from insurance (it may be cheaper with your insurance), but the website can give you the price if you did not use any insurance.  - You can print the associated coupon and take it with your prescription to the pharmacy.  - You may also stop by our office during regular business hours and pick up a GoodRx coupon card.  - If you need your prescription sent electronically to a different pharmacy, notify our office through [facility] MyChart or by phone at [phone] option 4.     Si Usted Necesita Algo Despus de Su Visita  Tambin puede enviarnos un mensaje a travs de MyChart. Por lo general respondemos a los mensajes de MyChart en el transcurso de 1 a 2 das hbiles.  Para renovar recetas, por favor pida a su farmacia que se ponga en contacto con nuestra oficina. Nuestro nmero de fax es el [phone].  Si tiene un asunto urgente cuando la clnica est cerrada y que no puede esperar hasta el siguiente da hbil, puede llamar/localizar a su doctor(a) al nmero que aparece a continuacin.   Por favor, tenga en cuenta que aunque hacemos todo lo posible para estar disponibles para asuntos urgentes fuera del horario de oficina, no estamos disponibles las 24 horas del da, los 7 das de la semana.   Si tiene un problema urgente y no puede comunicarse con nosotros, puede  optar por buscar atencin mdica  en el consultorio de su doctor(a), en una clnica privada, en un centro de atencin urgente o en una sala de emergencias.  Si tiene una emergencia mdica, por favor llame inmediatamente al 911 o vaya a la sala de emergencias.  Nmeros de bper  - Dr. Kowalski: [phone]  - Dra. Moye: [phone]  - Dra. Stewart: [phone]  En caso de inclemencias del tiempo, por favor llame a nuestra lnea principal al [phone] para una actualizacin sobre el estado de cualquier retraso o cierre.  Consejos para la medicacin en dermatologa: Por favor, guarde las cajas en las que vienen los medicamentos de uso tpico para ayudarle a seguir las instrucciones sobre dnde y cmo usarlos. Las farmacias generalmente imprimen las instrucciones del medicamento slo en las cajas y no directamente en los tubos del medicamento.   Si su medicamento es muy caro, por favor, pngase en contacto con   nuestra oficina llamando al [phone] y presione la opcin 4 o envenos un mensaje a travs de MyChart.   No podemos decirle cul ser su copago por los medicamentos por adelantado ya que esto es diferente dependiendo de la cobertura de su seguro. Sin embargo, es posible que podamos encontrar un medicamento sustituto a menor costo o llenar un formulario para que el seguro cubra el medicamento que se considera necesario.   Si se requiere una autorizacin previa para que su compaa de seguros cubra su medicamento, por favor permtanos de 1 a 2 das hbiles para completar este proceso.  Los precios de los medicamentos varan con frecuencia dependiendo del lugar de dnde se surte la receta y alguna farmacias pueden ofrecer precios ms baratos.  El sitio web www.goodrx.com tiene cupones para medicamentos de diferentes farmacias. Los precios aqu no tienen en cuenta lo que podra costar con la ayuda del seguro (puede ser ms barato con su seguro), pero el sitio web puede darle el  precio si no utiliz ningn seguro.  - Puede imprimir el cupn correspondiente y llevarlo con su receta a la farmacia.  - Tambin puede pasar por nuestra oficina durante el horario de atencin regular y recoger una tarjeta de cupones de GoodRx.  - Si necesita que su receta se enve electrnicamente a una farmacia diferente, informe a nuestra oficina a travs de MyChart de [place] o por telfono llamando al [phone] y presione la opcin 4.  

## 2022-11-29 DIAGNOSIS — J3089 Other allergic rhinitis: Secondary | ICD-10-CM | POA: Diagnosis not present

## 2022-11-29 DIAGNOSIS — J301 Allergic rhinitis due to pollen: Secondary | ICD-10-CM | POA: Diagnosis not present

## 2022-11-29 DIAGNOSIS — J3081 Allergic rhinitis due to animal (cat) (dog) hair and dander: Secondary | ICD-10-CM | POA: Diagnosis not present

## 2022-12-01 ENCOUNTER — Encounter: Payer: Self-pay | Admitting: Dermatology

## 2022-12-12 ENCOUNTER — Ambulatory Visit: Payer: Commercial Managed Care - PPO | Admitting: Psychology

## 2022-12-12 DIAGNOSIS — F4322 Adjustment disorder with anxiety: Secondary | ICD-10-CM | POA: Diagnosis not present

## 2022-12-12 NOTE — Progress Notes (Signed)
[facility] Counselor/Therapist Progress Note  Patient ID: Tracey Huff, MRN: [id number],    Date: 12/12/2022  Time Spent: 2:30pm-3:20pm     Treatment Type: Individual Therapy   pt is seen for virtual audio visit via phone.  Pt's phone wouldn't work w/ caregility and therefore unable to do the video visit.  Pt consents to virtual visit and is aware of limitations of such visits. Pt joins from work reporting privacy and counselor from her office.   Reported Symptoms: anxiety given husband's health and prognosis.  Frustrations w/ stressful interactions w/ others.  Pt reports not reacting to others and feels coping.    Mental Status Exam: Appearance:  N/a      Behavior: Appropriate  Motor: n/a  Speech/Language:  Clear and Coherent  Affect: Appropriate  Mood: anxious  Thought process: normal  Thought content:   WNL  Sensory/Perceptual disturbances:   WNL  Orientation: oriented to person, place, time/date, and situation  Attention: Good  Concentration: Good  Memory: WNL  Fund of knowledge:  Good  Insight:   Good  Judgment:  Good  Impulse Control: Good   Risk Assessment: Danger to Self:  No Self-injurious Behavior: No Danger to Others: No Duty to Warn:no Physical Aggression / Violence:No  Access to Firearms a concern: No  Gang Involvement:No   Subjective: Counselor assessed pt current functioning per pt report.  Processed w/pt recent stressor and emotions.  Validated and normalized anxiety.  Explored interactions and how she is setting boundaries for her coping and not not taking on more.    Pt affect wnl.  Pt reported husband returned home last week and is now on hospice care.  Pt reported his brain tumor has become more aggressive and his doctor not recommending any further tx.  Pt reported that hospice has been very supportive.  Pt discussed stressful interactions w/ his daughter, his mother, one of his sitters.  Pt discussed that she is setting boundaries and  having to tell others no and that not able to take on their problems.  Pt discussed positive support from some friends and coworkers, daughter and her brother.  Pt discussed scripture and encouraging words that assist her for coping.   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  Interventions: Cognitive Behavioral Therapy and supportive  Diagnosis:Adjustment disorder with anxiety  Plan: Pt f/u in 2-3 weeks for counseling.  Pt to f/u a scheduled w/ PCP.    Individualized Treatment Plan Strengths: seeking counseling   Supports: daughters, friends    Goal/Needs for Treatment:  In order of importance to patient 1) increase appropriate expression of feelings.   2) cope w/ stressors 3) ---    Client Statement of Needs: "I do things to cover up and not feel.  Creating safe space to talk.  Trying to find myself- for last 4 years I had to take care of him and put my self off. "    Treatment Level: outpt counseling  Symptoms: feeling anxious/on edge, worry, irritability  Client Treatment Preferences:biweekly counseling. Continue med management w/ PCP    Healthcare consumer's goal for treatment:   Counselor, Forde Radon, [facility] will support the patient's ability to achieve the goals identified. Cognitive Behavioral Therapy, Assertive Communication/Conflict Resolution Training, Relaxation Training, ACT, Humanistic and other evidenced-based practices will be used to promote progress towards healthy functioning.    Healthcare consumer will: Actively participate in therapy, working towards healthy functioning.     *Justification for Continuation/Discontinuation of Goal: R=Revised, O=Ongoing, A=Achieved,  D=Discontinued   Goal 1) Increase pt verbal expression of feelings and emotions. Baseline date 09/10/22: Progress towards goal 0; How Often - Daily Target Date Goal Was reviewed Status Code Progress towards goal/Likert rating  09/10/23                            Goal 2) Increase self care  and coping skills daily to manage stress to reduce anxiety and irritability AEB Pt report and therapist observation. Baseline date 09/10/22: Progress towards goal 0; How Often - Daily Target Date Goal Was reviewed Status Code Progress towards goal  09/10/23                            This plan has been reviewed and created by the following participants:  This plan will be reviewed at least every 12 months. Date Behavioral Health Clinician Date Guardian/Patient   09/10/22          [facility] [name] [facility] 09/10/22 Verbal Consent Provided                        Forde Radon [facility]

## 2022-12-17 DIAGNOSIS — J3081 Allergic rhinitis due to animal (cat) (dog) hair and dander: Secondary | ICD-10-CM | POA: Diagnosis not present

## 2022-12-17 DIAGNOSIS — J301 Allergic rhinitis due to pollen: Secondary | ICD-10-CM | POA: Diagnosis not present

## 2022-12-17 DIAGNOSIS — J3089 Other allergic rhinitis: Secondary | ICD-10-CM | POA: Diagnosis not present

## 2022-12-18 ENCOUNTER — Ambulatory Visit: Payer: Commercial Managed Care - PPO | Admitting: Allergy

## 2022-12-25 ENCOUNTER — Ambulatory Visit (INDEPENDENT_AMBULATORY_CARE_PROVIDER_SITE_OTHER): Payer: Commercial Managed Care - PPO | Admitting: Psychology

## 2022-12-25 DIAGNOSIS — F4322 Adjustment disorder with anxiety: Secondary | ICD-10-CM

## 2022-12-25 NOTE — Progress Notes (Signed)
[facility] Counselor/Therapist Progress Note  Patient ID: Tracey Huff, MRN: [id number],    Date: 12/25/2022  Time Spent: 2:31pm-2:59am     Treatment Type: Individual Therapy   pt is seen for virtual audio visit via phone.  Pt's phone wouldn't work w/ caregility and therefore unable to do the video visit.  Pt consents to virtual visit and is aware of limitations of such visits. Pt joins from work reporting privacy and counselor from her home office.   Reported Symptoms: anxiety w/ husband's health and prognosis.  Frustrations w/ related stressors.  Mental Status Exam: Appearance:  N/a      Behavior: Appropriate  Motor: n/a  Speech/Language:  Clear and Coherent  Affect: Appropriate  Mood: anxious and some frustation  Thought process: normal  Thought content:   WNL  Sensory/Perceptual disturbances:   WNL  Orientation: oriented to person, place, time/date, and situation  Attention: Good  Concentration: Good  Memory: WNL  Fund of knowledge:  Good  Insight:   Good  Judgment:  Good  Impulse Control: Good   Risk Assessment: Danger to Self:  No Self-injurious Behavior: No Danger to Others: No Duty to Warn:no Physical Aggression / Violence:No  Access to Firearms a concern: No  Gang Involvement:No   Subjective: Counselor assessed pt current functioning per pt report.  Processed w/pt recent stressor and emotions.  Validated and normalized emotions and discussed stress management and ways to take time for self.    Pt affect wnl. Pt informed need for shorter appointment today due to mandatory work meeting at 3pm.   Pt reported husband has been difficulty as more agitated and more confused.  Pt reports working to get better help to sit w/ him and should start next week.  Pt reports support from brother, daughter, Merchandiser, retail.  Pt recognizes need for time for self and plans to take respite days hospice can provide.  Interventions: Cognitive Behavioral Therapy and  supportive  Diagnosis:Adjustment disorder with anxiety  Plan: Pt f/u in 2-3 weeks for counseling.  Pt to f/u a scheduled w/ PCP.    Individualized Treatment Plan Strengths: seeking counseling   Supports: daughters, friends    Goal/Needs for Treatment:  In order of importance to patient 1) increase appropriate expression of feelings.   2) cope w/ stressors 3) ---    Client Statement of Needs: "I do things to cover up and not feel.  Creating safe space to talk.  Trying to find myself- for last 4 years I had to take care of him and put my self off. "    Treatment Level: outpt counseling  Symptoms: feeling anxious/on edge, worry, irritability  Client Treatment Preferences:biweekly counseling. Continue med management w/ PCP    Healthcare consumer's goal for treatment:   Counselor, Forde Radon, [facility] will support the patient's ability to achieve the goals identified. Cognitive Behavioral Therapy, Assertive Communication/Conflict Resolution Training, Relaxation Training, ACT, Humanistic and other evidenced-based practices will be used to promote progress towards healthy functioning.    Healthcare consumer will: Actively participate in therapy, working towards healthy functioning.     *Justification for Continuation/Discontinuation of Goal: R=Revised, O=Ongoing, A=Achieved, D=Discontinued   Goal 1) Increase pt verbal expression of feelings and emotions. Baseline date 09/10/22: Progress towards goal 0; How Often - Daily Target Date Goal Was reviewed Status Code Progress towards goal/Likert rating  09/10/23  Goal 2) Increase self care and coping skills daily to manage stress to reduce anxiety and irritability AEB Pt report and therapist observation. Baseline date 09/10/22: Progress towards goal 0; How Often - Daily Target Date Goal Was reviewed Status Code Progress towards goal  09/10/23                            This plan has been reviewed and created by the  following participants:  This plan will be reviewed at least every 12 months. Date Behavioral Health Clinician Date Guardian/Patient   09/10/22          [facility] [name] [facility] 09/10/22 Verbal Consent Provided                            Forde Radon [facility]

## 2022-12-26 DIAGNOSIS — J3081 Allergic rhinitis due to animal (cat) (dog) hair and dander: Secondary | ICD-10-CM | POA: Diagnosis not present

## 2022-12-26 DIAGNOSIS — J3089 Other allergic rhinitis: Secondary | ICD-10-CM | POA: Diagnosis not present

## 2022-12-26 DIAGNOSIS — J301 Allergic rhinitis due to pollen: Secondary | ICD-10-CM | POA: Diagnosis not present

## 2023-01-02 DIAGNOSIS — J301 Allergic rhinitis due to pollen: Secondary | ICD-10-CM | POA: Diagnosis not present

## 2023-01-02 DIAGNOSIS — J3081 Allergic rhinitis due to animal (cat) (dog) hair and dander: Secondary | ICD-10-CM | POA: Diagnosis not present

## 2023-01-02 DIAGNOSIS — J3089 Other allergic rhinitis: Secondary | ICD-10-CM | POA: Diagnosis not present

## 2023-01-08 ENCOUNTER — Ambulatory Visit (INDEPENDENT_AMBULATORY_CARE_PROVIDER_SITE_OTHER): Payer: Commercial Managed Care - PPO | Admitting: Psychology

## 2023-01-08 DIAGNOSIS — F4322 Adjustment disorder with anxiety: Secondary | ICD-10-CM

## 2023-01-08 NOTE — Progress Notes (Signed)
[facility] Counselor/Therapist Progress Note  Patient ID: Tracey Huff, MRN: [id number],    Date: 01/08/2023  Time Spent: 8:59am-9:54am     Treatment Type: Individual Therapy   pt is seen for virtual audio visit via phone.  Pt's phone wouldn't work w/ caregility and therefore unable to do the video visit.  Pt consents to virtual visit and is aware of limitations of such visits. Pt joins from work reporting privacy and counselor from her home office.   Reported Symptoms: worry w/ husband's health and prognosis.    Mental Status Exam: Appearance:  N/a      Behavior: Appropriate  Motor: n/a  Speech/Language:  Clear and Coherent  Affect: Appropriate  Mood: anxious  Thought process: normal  Thought content:   WNL  Sensory/Perceptual disturbances:   WNL  Orientation: oriented to person, place, time/date, and situation  Attention: Good  Concentration: Good  Memory: WNL  Fund of knowledge:  Good  Insight:   Good  Judgment:  Good  Impulse Control: Good   Risk Assessment: Danger to Self:  No Self-injurious Behavior: No Danger to Others: No Duty to Warn:no Physical Aggression / Violence:No  Access to Firearms a concern: No  Gang Involvement:No   Subjective: Counselor assessed pt current functioning per pt report.  Processed w/pt recent stressors and emotions.  Validated and normalized emotions and numbness.  Discussed supports and taking time for self care.  Pt affect wnl. Pt reported continuing to see decline w/ husband's health.  Pt reports positive support from hospice nurses.  Pt reports worry with seeing husband's continued decline.  Pt reports often feels numb with going through day to day caring for husband and how he has changed in past several years.  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  Pt reports his younger brother has been more present as well.  Pt discussed stressor of  still getting things handled w/ the car he owns.  Pt discussed her self care.   Interventions: Cognitive Behavioral Therapy and supportive  Diagnosis:Adjustment disorder with anxiety  Plan: Pt f/u in 2-3 weeks for counseling.  Pt to f/u a scheduled w/ PCP.    Individualized Treatment Plan Strengths: seeking counseling   Supports: daughters, friends    Goal/Needs for Treatment:  In order of importance to patient 1) increase appropriate expression of feelings.   2) cope w/ stressors 3) ---    Client Statement of Needs: "I do things to cover up and not feel.  Creating safe space to talk.  Trying to find myself- for last 4 years I had to take care of him and put my self off. "    Treatment Level: outpt counseling  Symptoms: feeling anxious/on edge, worry, irritability  Client Treatment Preferences:biweekly counseling. Continue med management w/ PCP    Healthcare consumer's goal for treatment:   Counselor, Forde Radon, [facility] will support the patient's ability to achieve the goals identified. Cognitive Behavioral Therapy, Assertive Communication/Conflict Resolution Training, Relaxation Training, ACT, Humanistic and other evidenced-based practices will be used to promote progress towards healthy functioning.    Healthcare consumer will: Actively participate in therapy, working towards healthy functioning.     *Justification for Continuation/Discontinuation of Goal: R=Revised, O=Ongoing, A=Achieved, D=Discontinued   Goal 1) Increase pt verbal expression of feelings and emotions. Baseline date 09/10/22: Progress towards goal 0; How Often - Daily Target Date Goal Was reviewed Status Code Progress towards goal/Likert rating  09/10/23                            Goal 2) Increase self care and coping skills daily to manage stress to reduce anxiety and irritability AEB Pt report and therapist observation. Baseline date 09/10/22: Progress towards goal 0; How Often - Daily Target Date Goal Was  reviewed Status Code Progress towards goal  09/10/23                            This plan has been reviewed and created by the following participants:  This plan will be reviewed at least every 12 months. Date Behavioral Health Clinician Date Guardian/Patient   09/10/22          [name] 09/10/22 Verbal Consent Provided                          [name]

## 2023-01-16 ENCOUNTER — Ambulatory Visit (INDEPENDENT_AMBULATORY_CARE_PROVIDER_SITE_OTHER): Payer: Commercial Managed Care - PPO | Admitting: Psychology

## 2023-01-16 DIAGNOSIS — F4322 Adjustment disorder with anxiety: Secondary | ICD-10-CM

## 2023-01-16 NOTE — Progress Notes (Signed)
[facility] Counselor/Therapist Progress Note  Patient ID: Tracey Huff, MRN: [id number],    Date: 01/16/2023  Time Spent: 2:45pm-3:17pm     Treatment Type: Individual Therapy   pt is seen for virtual audio visit via phone.  Pt's phone wouldn't work w/ caregility and therefore unable to do the video visit.  Pt consents to virtual visit and is aware of limitations of such visits. Pt joins from work reporting privacy and counselor from her office.   Reported Symptoms: stress w/ husband's continued health decline     Mental Status Exam: Appearance:  N/a      Behavior: Appropriate  Motor: n/a  Speech/Language:  Clear and Coherent  Affect: Appropriate  Mood: anxious  Thought process: normal  Thought content:   WNL  Sensory/Perceptual disturbances:   WNL  Orientation: oriented to person, place, time/date, and situation  Attention: Good  Concentration: Good  Memory: WNL  Fund of knowledge:  Good  Insight:   Good  Judgment:  Good  Impulse Control: Good   Risk Assessment: Danger to Self:  No Self-injurious Behavior: No Danger to Others: No Duty to Warn:no Physical Aggression / Violence:No  Access to Firearms a concern: No  Gang Involvement:No   Subjective: Counselor assessed pt current functioning per pt report.  Processed w/pt stresses and losses w/ husband's health.  Validated and normalized emotions.  Encouraged pt to utilize supports and taking time for self care.  Pt affect wnl. Pt reported late to appointment as forgot w/ busy morning- tending to husband.  Pt reports that husband's health continuing to decline and more signs nearing end of life.  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  Pt discussed how others question her going to work and pt identifies that gives time for self to not be in the stress of caretaker.  Pt reports some anxiety, some sadness and grief of losses of  relationship.  Pt recognizing end of his life is near.    Interventions: Cognitive Behavioral Therapy and supportive  Diagnosis:Adjustment disorder with anxiety  Plan: Pt f/u in 1 week for counseling.  Pt to f/u a scheduled w/ PCP.    Individualized Treatment Plan Strengths: seeking counseling   Supports: daughters, friends    Goal/Needs for Treatment:  In order of importance to patient 1) increase appropriate expression of feelings.   2) cope w/ stressors 3) ---    Client Statement of Needs: "I do things to cover up and not feel.  Creating safe space to talk.  Trying to find myself- for last 4 years I had to take care of him and put my self off. "    Treatment Level: outpt counseling  Symptoms: feeling anxious/on edge, worry, irritability  Client Treatment Preferences:biweekly counseling. Continue med management w/ PCP    Healthcare consumer's goal for treatment:   Counselor, Forde Radon, [facility] will support the patient's ability to achieve the goals identified. Cognitive Behavioral Therapy, Assertive Communication/Conflict Resolution Training, Relaxation Training, ACT, Humanistic and other evidenced-based practices will be used to promote progress towards healthy functioning.    Healthcare consumer will: Actively participate in therapy, working towards healthy functioning.     *Justification for Continuation/Discontinuation of Goal: R=Revised, O=Ongoing, A=Achieved, D=Discontinued   Goal 1) Increase pt verbal expression of feelings and emotions. Baseline date 09/10/22: Progress towards goal 0; How Often - Daily Target Date Goal Was reviewed Status Code Progress towards goal/Likert rating  09/10/23                            Goal 2) Increase self care and coping skills daily to manage stress to reduce anxiety and irritability AEB Pt report and therapist observation. Baseline date 09/10/22: Progress towards goal 0; How Often - Daily Target Date Goal Was reviewed Status Code  Progress towards goal  09/10/23                            This plan has been reviewed and created by the following participants:  This plan will be reviewed at least every 12 months. Date Behavioral Health Clinician Date Guardian/Patient   09/10/22          [facility] [name] [facility] 09/10/22 Verbal Consent Provided                            Forde Radon [facility]

## 2023-01-22 ENCOUNTER — Ambulatory Visit (INDEPENDENT_AMBULATORY_CARE_PROVIDER_SITE_OTHER): Payer: Commercial Managed Care - PPO | Admitting: Psychology

## 2023-01-22 DIAGNOSIS — F4322 Adjustment disorder with anxiety: Secondary | ICD-10-CM | POA: Diagnosis not present

## 2023-01-22 DIAGNOSIS — F4321 Adjustment disorder with depressed mood: Secondary | ICD-10-CM | POA: Diagnosis not present

## 2023-01-22 NOTE — Progress Notes (Signed)
[facility] Counselor/Therapist Progress Note  Patient ID: Tracey Huff, MRN: [id number],    Date: 01/22/2023  Time Spent: 9:00am-9:38am     Treatment Type: Individual Therapy   pt is seen for virtual audio visit via phone.  Pt's phone wouldn't work w/ caregility and therefore unable to do the video visit.  Pt consents to virtual visit and is aware of limitations of such visits. Pt joins from home reporting privacy and counselor from her office.   Reported Symptoms: grief with husband's recent death     Mental Status Exam: Appearance:  N/a      Behavior: Appropriate  Motor: n/a  Speech/Language:  Clear and Coherent  Affect: Appropriate  Mood: sad  Thought process: normal  Thought content:   WNL  Sensory/Perceptual disturbances:   WNL  Orientation: oriented to person, place, time/date, and situation  Attention: Good  Concentration: Good  Memory: WNL  Fund of knowledge:  Good  Insight:   Good  Judgment:  Good  Impulse Control: Good   Risk Assessment: Danger to Self:  No Self-injurious Behavior: No Danger to Others: No Duty to Warn:no Physical Aggression / Violence:No  Access to Firearms a concern: No  Gang Involvement:No   Subjective: Counselor assessed pt current functioning per pt report.  Processed w/pt husband's recent passing.  Validated and normalized range of emotions. Discussed pt supports and discussed pt self care.  Pt affect wnl.  Pt reports her husband died on 02/14/23 at home and she was present.  Pt discussed how after years living w/ tumor- how quick decline has been in past 2 months. Pt reports sadness, some guilt and worry.  Pt reports on supports present to her and hurt by others questioning of arrangements.  Pt acknowledged how she has taken care of him and following his wishes.  Pt discussed arrangements and to [facility] and acknowledged ways for her self care.   Interventions: Cognitive Behavioral Therapy and supportive  Diagnosis:Adjustment  disorder with anxiety  Grief  Plan: Pt f/u in 1 week for counseling.  Pt to f/u a scheduled w/ PCP.    Individualized Treatment Plan Strengths: seeking counseling   Supports: daughters, friends    Goal/Needs for Treatment:  In order of importance to patient 1) increase appropriate expression of feelings.   2) cope w/ stressors 3) ---    Client Statement of Needs: "I do things to cover up and not feel.  Creating safe space to talk.  Trying to find myself- for last 4 years I had to take care of him and put my self off. "    Treatment Level: outpt counseling  Symptoms: feeling anxious/on edge, worry, irritability  Client Treatment Preferences:biweekly counseling. Continue med management w/ PCP    Healthcare consumer's goal for treatment:   Counselor, Forde Radon, [facility] will support the patient's ability to achieve the goals identified. Cognitive Behavioral Therapy, Assertive Communication/Conflict Resolution Training, Relaxation Training, ACT, Humanistic and other evidenced-based practices will be used to promote progress towards healthy functioning.    Healthcare consumer will: Actively participate in therapy, working towards healthy functioning.     *Justification for Continuation/Discontinuation of Goal: R=Revised, O=Ongoing, A=Achieved, D=Discontinued   Goal 1) Increase pt verbal expression of feelings and emotions. Baseline date 09/10/22: Progress towards goal 0; How Often - Daily Target Date Goal Was reviewed Status Code Progress towards goal/Likert rating  09/10/23  Goal 2) Increase self care and coping skills daily to manage stress to reduce anxiety and irritability AEB Pt report and therapist observation. Baseline date 09/10/22: Progress towards goal 0; How Often - Daily Target Date Goal Was reviewed Status Code Progress towards goal  09/10/23                            This plan has been reviewed and created by the following participants:  This  plan will be reviewed at least every 12 months. Date Behavioral Health Clinician Date Guardian/Patient   09/10/22          [facility] [name] [facility] 09/10/22 Verbal Consent Provided                            Forde Radon [facility]

## 2023-01-29 ENCOUNTER — Ambulatory Visit (INDEPENDENT_AMBULATORY_CARE_PROVIDER_SITE_OTHER): Payer: Commercial Managed Care - PPO | Admitting: Psychology

## 2023-01-29 DIAGNOSIS — F4322 Adjustment disorder with anxiety: Secondary | ICD-10-CM | POA: Diagnosis not present

## 2023-01-29 DIAGNOSIS — F4321 Adjustment disorder with depressed mood: Secondary | ICD-10-CM

## 2023-01-29 NOTE — Progress Notes (Signed)
[facility] Counselor/Therapist Progress Note  Patient ID: Tracey Huff, MRN: [id number],    Date: 01/29/2023  Time Spent: 10:02am-10:30am     Treatment Type: Individual Therapy   pt is seen for virtual audio visit via phone.  Pt's phone wouldn't work w/ caregility and therefore unable to do the video visit.  Pt consents to virtual visit and is aware of limitations of such visits. Pt joins from home reporting privacy and counselor from her home office.   Reported Symptoms: increased anxiety and "thoughts all over the place"    Mental Status Exam: Appearance:  N/a      Behavior: Appropriate  Motor: n/a  Speech/Language:  Clear and Coherent  Affect: Appropriate  Mood: anxious  Thought process: normal  Thought content:   WNL  Sensory/Perceptual disturbances:   WNL  Orientation: oriented to person, place, time/date, and situation  Attention: Good  Concentration: Good  Memory: WNL  Fund of knowledge:  Good  Insight:   Good  Judgment:  Good  Impulse Control: Good   Risk Assessment: Danger to Self:  No Self-injurious Behavior: No Danger to Others: No Duty to Warn:no Physical Aggression / Violence:No  Access to Firearms a concern: No  Gang Involvement:No   Subjective: Counselor assessed pt current functioning per pt report.  Processed w/pt anxiety and grief.  Validated and normalized range of emotions experiencing.  Explored self care and boundaries setting that needs. Discussed supports and time for self.  Pt affect wnl.  Pt reports her anxiety has increased in past week and thoughts "all over the place".  Pt reported on some stressors w/ arrangements and ex wife contacting to ask to come to private viewing.  Pt discussed her need for boundaries and how to communicate this.  Pt discussed her supports.  Pt receptive to asking support for help as needed.  Pt reports did return to work for couple days this week and now out until next week.    Interventions: Cognitive  Behavioral Therapy and supportive  Diagnosis:Adjustment disorder with anxiety  Grief  Plan: Pt f/u in 1 week for counseling.  Pt to f/u a scheduled w/ PCP.    Individualized Treatment Plan Strengths: seeking counseling   Supports: daughters, friends    Goal/Needs for Treatment:  In order of importance to patient 1) increase appropriate expression of feelings.   2) cope w/ stressors 3) ---    Client Statement of Needs: "I do things to cover up and not feel.  Creating safe space to talk.  Trying to find myself- for last 4 years I had to take care of him and put my self off. "    Treatment Level: outpt counseling  Symptoms: feeling anxious/on edge, worry, irritability  Client Treatment Preferences:biweekly counseling. Continue med management w/ PCP    Healthcare consumer's goal for treatment:   Counselor, Forde Radon, [facility] will support the patient's ability to achieve the goals identified. Cognitive Behavioral Therapy, Assertive Communication/Conflict Resolution Training, Relaxation Training, ACT, Humanistic and other evidenced-based practices will be used to promote progress towards healthy functioning.    Healthcare consumer will: Actively participate in therapy, working towards healthy functioning.     *Justification for Continuation/Discontinuation of Goal: R=Revised, O=Ongoing, A=Achieved, D=Discontinued   Goal 1) Increase pt verbal expression of feelings and emotions. Baseline date 09/10/22: Progress towards goal 0; How Often - Daily Target Date Goal Was reviewed Status Code Progress towards goal/Likert rating  09/10/23  Goal 2) Increase self care and coping skills daily to manage stress to reduce anxiety and irritability AEB Pt report and therapist observation. Baseline date 09/10/22: Progress towards goal 0; How Often - Daily Target Date Goal Was reviewed Status Code Progress towards goal  09/10/23                            This plan has been  reviewed and created by the following participants:  This plan will be reviewed at least every 12 months. Date Behavioral Health Clinician Date Guardian/Patient   09/10/22          [facility] [name] [facility] 09/10/22 Verbal Consent Provided                            Forde Radon [facility]

## 2023-01-30 ENCOUNTER — Ambulatory Visit: Payer: Self-pay | Admitting: *Deleted

## 2023-01-30 NOTE — Telephone Encounter (Signed)
Chief Complaint: Anxiety Symptoms: increased anxiety, decreased appetite, trouble sleeping Frequency: constant since 02-16-23 Pertinent Negatives: Patient denies thoughts of self harm or harm to others Disposition: [] ED /[] Urgent Care (no appt availability in office) / [x] Appointment(In office/virtual)/ []  [facility] [facility]/ [] Home Care/ [] Refused Recommended Disposition /[] [facility]/ []  Follow-up with PCP Additional Notes: Patient states she has been taking Buspar as needed for a while and it has been helpful up until her husband passed away on 2023/02/16. Patient stated she started taking Buspar 7.5 mg everyday since he passed away and it does not seem to be helping anymore she is unable to sleep and feels like a ticking time bomb. Patient stated she feels on edge about the funeral this Saturday because she does not get along with his family. Patient is requesting medication to help keep her calm during the funeral. Patient stated she spoke with her therapist who advised her to call PCP. Care advice was given and patient has been scheduled to see PCP tomorrow at 1320. Advised patient to call back if symptoms get worse. Patient verbalized understanding.   Reason for Disposition  [1] Started on anti-anxiety medication AND [2] no relief  Answer Assessment - Initial Assessment Questions 1. CONCERN: "Did anything happen that prompted you to call today?"      Preparing for spouse funeral on Saturday  2. ANXIETY SYMPTOMS: "Can you describe how you (your loved one; patient) have been feeling?" (e.g., tense, restless, panicky, anxious, keyed up, overwhelmed, sense of impending doom).      I feel like a walking time bomb, anxious, tense, overwhelmed 3. ONSET: "How long have you been feeling this way?" (e.g., hours, days, weeks)     February 16, 2023 4. SEVERITY: "How would you rate the level of anxiety?" (e.g., 0 - 10; or mild, moderate, severe).     7/10 5. FUNCTIONAL IMPAIRMENT: "How have these  feelings affected your ability to do daily activities?" "Have you had more difficulty than usual doing your normal daily activities?" (e.g., getting better, same, worse; self-care, school, work, interactions)     Yes, I haven't been able to work as much as I usual do.  6. HISTORY: "Have you felt this way before?" "Have you ever been diagnosed with an anxiety problem in the past?" (e.g., generalized anxiety disorder, panic attacks, PTSD). If Yes, ask: "How was this problem treated?" (e.g., medicines, counseling, etc.)     Generalized anxiety I see a therapist  7. RISK OF HARM - SUICIDAL IDEATION: "Do you ever have thoughts of hurting or killing yourself?" If Yes, ask:  "Do you have these feelings now?" "Do you have a plan on how you would do this?"     No  8. TREATMENT:  "What has been done so far to treat this anxiety?" (e.g., medicines, relaxation strategies). "What has helped?"     Buspar 7.5 mg, it was helpful but not at this time 9. TREATMENT - THERAPIST: "Do you have a counselor or therapist? Name?"     Therapist Inda Coke, we talk once a week  10. POTENTIAL TRIGGERS: "Do you drink caffeinated beverages (e.g., coffee, colas, teas), and how much daily?" "Do you drink alcohol or use any drugs?" "Have you started any new medicines recently?"       Yes I drink a lot of caffeine but I just started to cut back.  11. PATIENT SUPPORT: "Who is with you now?" "Who do you live with?" "Do you have family or friends who you can  talk to?"        Yes. 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 OTHER SYMPTOMS: "Do you have any other symptoms?" (e.g., feeling depressed, trouble concentrating, trouble sleeping, trouble breathing, palpitations or fast heartbeat, chest pain, sweating, nausea, or diarrhea)       Trouble sleeping, decreased appetite,  Protocols used: Anxiety and Panic Attack-A-AH

## 2023-01-30 NOTE — Telephone Encounter (Signed)
Summary: medication request   Patient called stated her spouse passed on 01/19/23 and she will be burying him on Sat. She is taking busPIRone (BUSPAR) 7.5 MG tablet but its not working. Patient is requesting a stronger medication that will help her to stay sleep and with her anxiety. She also has no appetite.     Attempted to call patient- no answer-left message to call office

## 2023-01-30 NOTE — Telephone Encounter (Signed)
FYI

## 2023-01-31 ENCOUNTER — Ambulatory Visit (INDEPENDENT_AMBULATORY_CARE_PROVIDER_SITE_OTHER): Payer: Commercial Managed Care - PPO | Admitting: Nurse Practitioner

## 2023-01-31 ENCOUNTER — Other Ambulatory Visit: Payer: Self-pay

## 2023-01-31 ENCOUNTER — Encounter: Payer: Self-pay | Admitting: Nurse Practitioner

## 2023-01-31 VITALS — BP 120/72 | HR 87 | Temp 98.2°F | Resp 16 | Ht 60.0 in | Wt 165.5 lb

## 2023-01-31 DIAGNOSIS — F411 Generalized anxiety disorder: Secondary | ICD-10-CM | POA: Diagnosis not present

## 2023-01-31 DIAGNOSIS — F331 Major depressive disorder, recurrent, moderate: Secondary | ICD-10-CM | POA: Insufficient documentation

## 2023-01-31 DIAGNOSIS — J3089 Other allergic rhinitis: Secondary | ICD-10-CM | POA: Diagnosis not present

## 2023-01-31 DIAGNOSIS — F430 Acute stress reaction: Secondary | ICD-10-CM | POA: Diagnosis not present

## 2023-01-31 DIAGNOSIS — J301 Allergic rhinitis due to pollen: Secondary | ICD-10-CM | POA: Diagnosis not present

## 2023-01-31 DIAGNOSIS — J3081 Allergic rhinitis due to animal (cat) (dog) hair and dander: Secondary | ICD-10-CM | POA: Diagnosis not present

## 2023-01-31 MED ORDER — ALPRAZOLAM 0.25 MG PO TABS
0.2500 mg | ORAL_TABLET | Freq: Two times a day (BID) | ORAL | 0 refills | Status: AC | PRN
Start: 2023-01-31 — End: ?

## 2023-01-31 MED ORDER — BUSPIRONE HCL 10 MG PO TABS: 10.0000 mg | ORAL_TABLET | Freq: Two times a day (BID) | ORAL | 0 refills | Status: DC | PRN

## 2023-01-31 MED ORDER — VENLAFAXINE HCL ER 150 MG PO CP24: 150.0000 mg | ORAL_CAPSULE | Freq: Every day | ORAL | 0 refills | Status: DC

## 2023-01-31 NOTE — Progress Notes (Signed)
BP 120/72   Pulse 87   Temp 98.2 F (36.8 C) (Oral)   Resp 16   Ht 5' (1.524 m)   Wt 165 lb 8 oz (75.1 kg)   LMP 11/15/2014   SpO2 100%   BMI 32.32 kg/m    Subjective:    Patient ID: Tracey Huff, female    DOB: 1971-04-11, 52 y.o.   MRN: [id number]  HPI: Tracey Huff is a 51 y.o. female  Chief Complaint  Patient presents with   Anxiety    Discuss increase on medication   Anxiety/depression: currently on effexor 75 mg daily and  buspar 7.5 mg as needed, which has helped in the past. She is currently in therapy.  Her husband recently passed and she feels her anxiety has gotten much worse.  She reports that she is having trouble sleeping as well. She reports she feels on edge and she is very concerned about her husbands funeral on Saturday.  She is having a lot of stress with family.  Discussed options, will increase effexor and buspar and give her xanax to take as needed for extreme anxiety.  Patient will follow up in 4 weeks.      01/31/2023   12:11 PM 11/25/2022    8:53 AM 08/29/2022    3:07 PM 08/29/2022    9:33 AM 08/09/2022    8:51 AM  Depression screen PHQ 2/9  Decreased Interest 0 0 0 0 0  Down, Depressed, Hopeless 1 0 0 0 0  PHQ - 2 Score 1 0 0 0 0  Altered sleeping 1 0     Tired, decreased energy 0 0     Change in appetite 1 0     Feeling bad or failure about yourself  1 0     Trouble concentrating 1 0     Moving slowly or fidgety/restless 1 0     Suicidal thoughts 0 0     PHQ-9 Score 6 0     Difficult doing work/chores Not difficult at all Not difficult at all          01/31/2023   12:16 PM 11/25/2022    8:54 AM 05/10/2022    9:55 AM 11/07/2021    9:59 AM  GAD 7 : Generalized Anxiety Score  Nervous, Anxious, on Edge 2 1 0 2  Control/stop worrying 1 0 0 0  Worry too much - different things 1 1 0 1  Trouble relaxing 2 1 0 1  Restless 1 0 0 1  Easily annoyed or irritable 2 1 0 1  Afraid - awful might happen 1 0 0 1  Total GAD 7 Score 10 4 0 7  Anxiety  Difficulty Somewhat difficult Somewhat difficult Not difficult at all Not difficult at all     Relevant past medical, surgical, family and social history reviewed and updated as indicated. Interim medical history since our last visit reviewed. Allergies and medications reviewed and updated.  Review of Systems  Constitutional: Negative for fever or weight change.  Respiratory: Negative for cough and shortness of breath.   Cardiovascular: Negative for chest pain or palpitations.  Gastrointestinal: Negative for abdominal pain, no bowel changes.  Musculoskeletal: Negative for gait problem or joint swelling.  Skin: Negative for rash.  Neurological: Negative for dizziness or headache.  No other specific complaints in a complete review of systems (except as listed in HPI above).      Objective:    BP 120/72   Pulse  87   Temp 98.2 F (36.8 C) (Oral)   Resp 16   Ht 5' (1.524 m)   Wt 165 lb 8 oz (75.1 kg)   LMP 11/15/2014   SpO2 100%   BMI 32.32 kg/m   Wt Readings from Last 3 Encounters:  01/31/23 165 lb 8 oz (75.1 kg)  11/26/22 173 lb 9.6 oz (78.7 kg)  11/25/22 174 lb 11.2 oz (79.2 kg)    Physical Exam  Constitutional: Patient appears well-developed and well-nourished. Obese  No distress.  HEENT: head atraumatic, normocephalic, pupils equal and reactive to light, neck supple Cardiovascular: Normal rate, regular rhythm and normal heart sounds.  No murmur heard. No BLE edema. Pulmonary/Chest: Effort normal and breath sounds normal. No respiratory distress. Abdominal: Soft.  There is no tenderness. Psychiatric: Patient has a normal mood and affect. behavior is normal. Judgment and thought content normal.   Results for orders placed or performed in visit on 11/25/22  POCT glycosylated hemoglobin (Hb A1C)  Result Value Ref Range   Hemoglobin A1C 6.0 (A) 4.0 - 5.6 %   HbA1c POC (<> result, manual entry)     HbA1c, POC (prediabetic range)     HbA1c, POC (controlled diabetic range)         Assessment & Plan:   Problem List Items Addressed This Visit       Other   Anxiety in acute stress reaction - Primary     will increase effexor and buspar and give her xanax to take as needed for extreme anxiety.  Patient will follow up in 4 weeks.       Relevant Medications   venlafaxine XR (EFFEXOR XR) 150 MG 24 hr capsule   busPIRone (BUSPAR) 10 MG tablet   ALPRAZolam (XANAX) 0.25 MG tablet   Moderate episode of recurrent major depressive disorder (HCC)     will increase effexor and buspar and give her xanax to take as needed for extreme anxiety.  Patient will follow up in 4 weeks.       Relevant Medications   venlafaxine XR (EFFEXOR XR) 150 MG 24 hr capsule   busPIRone (BUSPAR) 10 MG tablet   ALPRAZolam (XANAX) 0.25 MG tablet     Follow up plan: Return in about 4 weeks (around 02/28/2023) for follow up.

## 2023-01-31 NOTE — Assessment & Plan Note (Signed)
will increase effexor and buspar and give her xanax to take as needed for extreme anxiety.  Patient will follow up in 4 weeks.

## 2023-02-03 ENCOUNTER — Other Ambulatory Visit (HOSPITAL_COMMUNITY): Payer: Self-pay

## 2023-02-03 ENCOUNTER — Ambulatory Visit (INDEPENDENT_AMBULATORY_CARE_PROVIDER_SITE_OTHER): Payer: Commercial Managed Care - PPO | Admitting: Psychology

## 2023-02-03 DIAGNOSIS — F4321 Adjustment disorder with depressed mood: Secondary | ICD-10-CM

## 2023-02-03 DIAGNOSIS — F4322 Adjustment disorder with anxiety: Secondary | ICD-10-CM | POA: Diagnosis not present

## 2023-02-03 NOTE — Progress Notes (Signed)
[facility] Counselor/Therapist Progress Note  Patient ID: Tracey Huff, MRN: [id number],    Date: 02/03/2023  Time Spent: 2:31pm-3:12pm     Treatment Type: Individual Therapy   pt is seen for virtual audio visit via phone.  Pt's phone wouldn't work w/ caregility and therefore unable to do the video visit.  Pt consents to virtual visit and is aware of limitations of such visits. Pt joins from her parked car, reporting privacy, and counselor from her home office.   Reported Symptoms: less anxious, grief, some frustrations.      Mental Status Exam: Appearance:  N/a      Behavior: Appropriate  Motor: n/a  Speech/Language:  Clear and Coherent  Affect: Appropriate  Mood: anxious  Thought process: normal  Thought content:   WNL  Sensory/Perceptual disturbances:   WNL  Orientation: oriented to person, place, time/date, and situation  Attention: Good  Concentration: Good  Memory: WNL  Fund of knowledge:  Good  Insight:   Good  Judgment:  Good  Impulse Control: Good   Risk Assessment: Danger to Self:  No Self-injurious Behavior: No Danger to Others: No Duty to Warn:no Physical Aggression / Violence:No  Access to Firearms a concern: No  Gang Involvement:No   Subjective: Counselor assessed pt current functioning per pt report.  Processed w/pt mood and grief.  Discussed funeral and support received and some stressful interactions.  Reiterated positive support through.  Explored things pt has been busy w/ re: estate.  Discussed self care and continue connecting w/ her supports.  Pt affect wnl.  Pt reports her anxiety has decreased. Pt reported that things came together for funeral.  Pt was frustrated that ex still showed for the private viewing and that his daughter hasn't cooperated w/ returning vehicle tags to her.  Pt reports that she feels good support from others.  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  Pt discussed awareness of  needs for self care.   Interventions: Cognitive Behavioral Therapy and supportive  Diagnosis:Adjustment disorder with anxiety  Grief  Plan: Pt f/u in 1 week for counseling.  Pt to f/u a scheduled w/ PCP.    Individualized Treatment Plan Strengths: seeking counseling   Supports: daughters, friends    Goal/Needs for Treatment:  In order of importance to patient 1) increase appropriate expression of feelings.   2) cope w/ stressors 3) ---    Client Statement of Needs: "I do things to cover up and not feel.  Creating safe space to talk.  Trying to find myself- for last 4 years I had to take care of him and put my self off. "    Treatment Level: outpt counseling  Symptoms: feeling anxious/on edge, worry, irritability  Client Treatment Preferences:biweekly counseling. Continue med management w/ PCP    Healthcare consumer's goal for treatment:   Counselor, Forde Radon, [facility] will support the patient's ability to achieve the goals identified. Cognitive Behavioral Therapy, Assertive Communication/Conflict Resolution Training, Relaxation Training, ACT, Humanistic and other evidenced-based practices will be used to promote progress towards healthy functioning.    Healthcare consumer will: Actively participate in therapy, working towards healthy functioning.     *Justification for Continuation/Discontinuation of Goal: R=Revised, O=Ongoing, A=Achieved, D=Discontinued   Goal 1) Increase pt verbal expression of feelings and emotions. Baseline date 09/10/22: Progress towards goal 0; How Often - Daily Target Date Goal Was reviewed Status Code Progress towards goal/Likert rating  09/10/23  Goal 2) Increase self care and coping skills daily to manage stress to reduce anxiety and irritability AEB Pt report and therapist observation. Baseline date 09/10/22: Progress towards goal 0; How Often - Daily Target Date Goal Was reviewed Status Code Progress towards goal  09/10/23                             This plan has been reviewed and created by the following participants:  This plan will be reviewed at least every 12 months. Date Behavioral Health Clinician Date Guardian/Patient   09/10/22          [facility] [name] 09/10/22 Verbal Consent Provided                             Forde Radon [facility]

## 2023-02-06 ENCOUNTER — Other Ambulatory Visit: Payer: Self-pay | Admitting: Nurse Practitioner

## 2023-02-06 ENCOUNTER — Other Ambulatory Visit (HOSPITAL_COMMUNITY): Payer: Self-pay

## 2023-02-06 DIAGNOSIS — Z79899 Other long term (current) drug therapy: Secondary | ICD-10-CM

## 2023-02-06 DIAGNOSIS — I10 Essential (primary) hypertension: Secondary | ICD-10-CM

## 2023-02-06 MED ORDER — LOSARTAN POTASSIUM 25 MG PO TABS
25.0000 mg | ORAL_TABLET | ORAL | 0 refills | Status: DC
Start: 2023-02-06 — End: 2023-02-10
  Filled 2023-02-06: qty 90, 90d supply, fill #0

## 2023-02-06 NOTE — Telephone Encounter (Signed)
Patient requesting refill. Future visit in 4 days. Refills remaining.  Requested Prescriptions  Pending Prescriptions Disp Refills   losartan (COZAAR) 25 MG tablet 90 tablet 0    Sig: Take 1 tablet (25 mg total) by mouth every morning.     Cardiovascular:  Angiotensin Receptor Blockers Passed - 02/06/2023 10:51 AM      Passed - Cr in normal range and within 180 days    Creat  Date Value Ref Range Status  05/10/2022 0.83 0.50 - 1.03 mg/dL Final   Creatinine, Ser  Date Value Ref Range Status  11/13/2022 0.74 0.57 - 1.00 mg/dL Final   Creatinine, Urine  Date Value Ref Range Status  08/09/2022 205 20 - 275 mg/dL Final         Passed - K in normal range and within 180 days    Potassium  Date Value Ref Range Status  11/13/2022 4.1 3.5 - 5.2 mmol/L Final  08/24/2014 3.3 (L) mmol/L Final    Comment:    3.5-5.1 NOTE: New Reference Range  07/12/14          Passed - Patient is not pregnant      Passed - Last BP in normal range    BP Readings from Last 1 Encounters:  01/31/23 120/72         Passed - Valid encounter within last 6 months    Recent Outpatient Visits           6 days ago Anxiety in acute stress reaction   [facility] Berniece Salines, FNP   2 months ago Type 2 diabetes mellitus with hyperglycemia, with long-term current use of insulin [facility])   [facility] Berniece Salines, FNP   5 months ago Lip lesion   [facility] Berniece Salines, FNP   5 months ago Lip lesion   [facility] Danelle Berry, PA-C   6 months ago Annual physical exam   [facility] Berniece Salines, FNP       Future Appointments             In 4 days Zane Herald, Rudolpho Sevin, FNP [facility]   In 3 weeks Zane Herald, Rudolpho Sevin, FNP [facility]   In 1 month Deirdre Evener, MD [facility]

## 2023-02-06 NOTE — Telephone Encounter (Signed)
Medication Refill - Medication: losartan (COZAAR) 25 MG tablet  Pt is all out of this med   Has the patient contacted their pharmacy? Pt says sees in chart that it is in progess, no refills, so I put I again just in case there's a problem (Agent: If yes, when and what did the pharmacy advise?)contact pcp  Preferred Pharmacy (with phone number or street name):  [facility] Phone: [phone]  Fax: [phone]     Has the patient been seen for an appointment in the last year OR does the patient have an upcoming appointment? yes  Agent: Please be advised that RX refills may take up to 3 business days. We ask that you follow-up with your pharmacy.

## 2023-02-10 ENCOUNTER — Other Ambulatory Visit (HOSPITAL_COMMUNITY): Payer: Self-pay

## 2023-02-10 ENCOUNTER — Other Ambulatory Visit: Payer: Self-pay

## 2023-02-10 ENCOUNTER — Ambulatory Visit: Payer: Commercial Managed Care - PPO | Admitting: Nurse Practitioner

## 2023-02-10 ENCOUNTER — Encounter: Payer: Self-pay | Admitting: Nurse Practitioner

## 2023-02-10 VITALS — BP 120/76 | HR 85 | Temp 98.3°F | Resp 16 | Ht 60.0 in | Wt 174.4 lb

## 2023-02-10 DIAGNOSIS — J4520 Mild intermittent asthma, uncomplicated: Secondary | ICD-10-CM

## 2023-02-10 DIAGNOSIS — E1165 Type 2 diabetes mellitus with hyperglycemia: Secondary | ICD-10-CM | POA: Diagnosis not present

## 2023-02-10 DIAGNOSIS — E785 Hyperlipidemia, unspecified: Secondary | ICD-10-CM | POA: Diagnosis not present

## 2023-02-10 DIAGNOSIS — F331 Major depressive disorder, recurrent, moderate: Secondary | ICD-10-CM

## 2023-02-10 DIAGNOSIS — C50412 Malignant neoplasm of upper-outer quadrant of left female breast: Secondary | ICD-10-CM | POA: Diagnosis not present

## 2023-02-10 DIAGNOSIS — F411 Generalized anxiety disorder: Secondary | ICD-10-CM

## 2023-02-10 DIAGNOSIS — Z79899 Other long term (current) drug therapy: Secondary | ICD-10-CM

## 2023-02-10 DIAGNOSIS — J309 Allergic rhinitis, unspecified: Secondary | ICD-10-CM | POA: Diagnosis not present

## 2023-02-10 DIAGNOSIS — Z794 Long term (current) use of insulin: Secondary | ICD-10-CM

## 2023-02-10 DIAGNOSIS — F430 Acute stress reaction: Secondary | ICD-10-CM

## 2023-02-10 DIAGNOSIS — E6609 Other obesity due to excess calories: Secondary | ICD-10-CM

## 2023-02-10 DIAGNOSIS — I10 Essential (primary) hypertension: Secondary | ICD-10-CM

## 2023-02-10 DIAGNOSIS — Z6833 Body mass index (BMI) 33.0-33.9, adult: Secondary | ICD-10-CM

## 2023-02-10 MED ORDER — OZEMPIC (0.25 OR 0.5 MG/DOSE) 2 MG/3ML ~~LOC~~ SOPN
0.2500 mg | PEN_INJECTOR | SUBCUTANEOUS | 1 refills | Status: DC
  Filled 2023-02-10: qty 9, fill #0
  Filled 2023-02-27: qty 3, 28d supply, fill #0
  Filled 2023-05-02: qty 3, 28d supply, fill #1
  Filled 2023-06-02: qty 3, 28d supply, fill #2
  Filled 2023-07-09: qty 3, 28d supply, fill #3

## 2023-02-10 MED ORDER — BUSPIRONE HCL 10 MG PO TABS
10.0000 mg | ORAL_TABLET | Freq: Two times a day (BID) | ORAL | 1 refills | Status: DC | PRN
  Filled 2023-02-10: qty 120, 60d supply, fill #0

## 2023-02-10 MED ORDER — EZETIMIBE 10 MG PO TABS
10.0000 mg | ORAL_TABLET | Freq: Every day | ORAL | 3 refills | Status: AC
  Filled 2023-02-10: qty 90, 90d supply, fill #0

## 2023-02-10 MED ORDER — ATORVASTATIN CALCIUM 10 MG PO TABS
10.0000 mg | ORAL_TABLET | Freq: Every day | ORAL | 1 refills | Status: DC
  Filled 2023-02-10: qty 90, 90d supply, fill #0

## 2023-02-10 MED ORDER — VENLAFAXINE HCL ER 150 MG PO CP24
150.0000 mg | ORAL_CAPSULE | Freq: Every day | ORAL | 1 refills | Status: DC
  Filled 2023-02-10: qty 90, 90d supply, fill #0

## 2023-02-10 MED ORDER — LOSARTAN POTASSIUM 25 MG PO TABS
25.0000 mg | ORAL_TABLET | ORAL | 1 refills | Status: DC
  Filled 2023-02-10 – 2023-05-08 (×2): qty 90, 90d supply, fill #0
  Filled 2023-08-07: qty 90, 90d supply, fill #1

## 2023-02-10 NOTE — Assessment & Plan Note (Signed)
Continue Effexor 150 mg daily and BuSpar 10 mg twice daily as needed.

## 2023-02-10 NOTE — Assessment & Plan Note (Signed)
Doing well no changes at this time.

## 2023-02-10 NOTE — Assessment & Plan Note (Signed)
Increasing Ozempic to 0.5 mg weekly

## 2023-02-10 NOTE — Assessment & Plan Note (Signed)
Continue albuterol, pataday, duoNeb, trelegy, flonase, astelin, getting allergy shots

## 2023-02-10 NOTE — Assessment & Plan Note (Signed)
increase Ozempic to 0.5 mg weekly

## 2023-02-10 NOTE — Assessment & Plan Note (Signed)
Blood pressure at goal. Continue losartan 25 mg daily

## 2023-02-10 NOTE — Progress Notes (Signed)
BP 120/76   Pulse 85   Temp 98.3 F (36.8 C) (Oral)   Resp 16   Ht 5' (1.524 m)   Wt 174 lb 6.4 oz (79.1 kg)   LMP 11/15/2014   SpO2 99%   BMI 34.06 kg/m    Subjective:    Patient ID: Tracey Huff, female    DOB: February 28, 1971, 52 y.o.   MRN: [id number]  HPI: Tracey Huff is a 52 y.o. female  Chief Complaint  Patient presents with   Medical Management of Chronic Issues    6 month recheck   Depression/anxiety Medication buspar 10  mg BID, effexor 150 mg daily, temporarily given xanax prescription due to death of her husband Compliant yes Side effects none PHQ9 improved GAD improved Therapy being seen at [facility].       02/10/2023    9:14 AM 01/31/2023   12:16 PM 11/25/2022    8:54 AM 05/10/2022    9:55 AM  GAD 7 : Generalized Anxiety Score  Nervous, Anxious, on Edge 1 2 1  0  Control/stop worrying 0 1 0 0  Worry too much - different things 1 1 1  0  Trouble relaxing 0 2 1 0  Restless 0 1 0 0  Easily annoyed or irritable 1 2 1  0  Afraid - awful might happen 1 1 0 0  Total GAD 7 Score 4 10 4  0  Anxiety Difficulty  Somewhat difficult Somewhat difficult Not difficult at all        02/10/2023    9:14 AM 01/31/2023   12:11 PM 11/25/2022    8:53 AM 08/29/2022    3:07 PM 08/29/2022    9:33 AM  Depression screen PHQ 2/9  Decreased Interest 0 0 0 0 0  Down, Depressed, Hopeless 0 1 0 0 0  PHQ - 2 Score 0 1 0 0 0  Altered sleeping 1 1 0    Tired, decreased energy 0 0 0    Change in appetite 1 1 0    Feeling bad or failure about yourself  1 1 0    Trouble concentrating 0 1 0    Moving slowly or fidgety/restless 0 1 0    Suicidal thoughts 0 0 0    PHQ-9 Score 3 6 0    Difficult doing work/chores Somewhat difficult Not difficult at all Not difficult at all       Breast ca: patient reports she was supposed to get her second reconstruction surgery on the 29 but had to postpone due to illness.  Patient had a double mastectomy and  reconstruction done by Dr. Everlene Farrier.  Her oncologist is Dr. Donneta Romberg.  She last saw oncology on 04/17/2022.  She is currently taking tamoxifen.  Doing well, no changes.    Carcinoma of upper-outer quadrant of left breast in female, estrogen receptor positive (HCC) #Left breast cancer T9mN0- G-2; ER/PR positive HER2 negative status BIL postmastectomy [positive for CHEK-2 mutation].  Oncotype recurrence score 16/LOW. On Tamoxifen [s/p hysterectomy [intact ovaries]; no role for mammograms.  Asthma/allergies:   Asthma:    -Asthma status: controlled -Current Treatments: albuterol, pataday, duoNeb, trelegy, flonase, astelin, getting allergy shots -Satisfied with current treatment?: yes -Albuterol/rescue inhaler frequency: occasionally -Dyspnea frequency: not often -Wheezing frequency:not often -Cough frequency: every morning -Nocturnal symptom frequency: none -Limitation of activity: no -Current upper respiratory symptoms: no -Triggers: smells, weather, allergens -Aerochamber/spacer use: no -Visits to ER or Urgent Care in past year: no -Pneumovax: Not  up to Date -Influenza: Not up to Date   Hypertension: -Medications: losartan 25 mg daily -Patient is compliant with above medications and reports no side effects. -Checking BP at home (average): 120s -Denies any SOB, CP, vision changes, LE edema or symptoms of hypotension -Diet: recommend DASH diet  -Exercise: recommend 150 min of physical activity weekly       02/10/2023    9:10 AM 01/31/2023   12:07 PM 11/27/2022   12:33 PM  Vitals with BMI  Height 5\' 0"  5\' 0"    Weight 174 lbs 6 oz 165 lbs 8 oz   BMI 34.06 32.32   Systolic 120 120 865  Diastolic 76 72 87  Pulse 85 87 84    HLD: -Medications: atorvastatin 10 mg daily, zetia 10 mg daily -Patient is compliant with above medications and reports no side effects.  -Last lipid panel:  Lipid Panel     Component Value Date/Time   CHOL 237 (H) 05/10/2022 1036   TRIG 117 05/10/2022 1036    HDL 61 05/10/2022 1036   CHOLHDL 3.9 05/10/2022 1036   LDLCALC 153 (H) 05/10/2022 1036   LDLDIRECT 180 (H) 11/08/2020 1105      The 10-year ASCVD risk score (Arnett DK, et al., 2019) is: 7.5%   Values used to calculate the score:     Age: 58 years     Sex: Female     Is Non-Hispanic African American: Yes     Diabetic: Yes     Tobacco smoker: No     Systolic Blood Pressure: 120 mmHg     Is BP treated: Yes     HDL Cholesterol: 61 mg/dL     Total Cholesterol: 237 mg/dL   Diabetes, Type 2:  -Last A1c 6.8 -Medications: ozempic 0.25 -Patient is compliant with the above medications and reports no side effects.  -Checking BG at home: not often -Diet: reduce sugar and processed foods in your diet  -Exercise: recommend 150 min of physical activity weekly   -Eye exam: due -Foot exam: utd -Microalbumin: utd -Statin: yes -PNA vaccine: no -Denies symptoms of hypoglycemia, polyuria, polydipsia, numbness extremities, foot ulcers/trauma.   Obesity:  Current weight : 174 lbs BMI: 34.06 Previous  weight:165 lbs Treatment Tried: lifestyle modification, on ozempic  Comorbidities: DM, HTN, HLD   Relevant past medical, surgical, family and social history reviewed and updated as indicated. Interim medical history since our last visit reviewed. Allergies and medications reviewed and updated.  Review of Systems Constitutional: Negative for fever or weight change.  Respiratory: Negative for cough and shortness of breath.   Cardiovascular: Negative for chest pain or palpitations.  Gastrointestinal: Negative for abdominal pain, no bowel changes.  Musculoskeletal: Negative for gait problem or joint swelling.  Skin: Negative for rash.  Neurological: Negative for dizziness or headache.  No other specific complaints in a complete review of systems (except as listed in HPI above).      Objective:    BP 120/76   Pulse 85   Temp 98.3 F (36.8 C) (Oral)   Resp 16   Ht 5' (1.524 m)   Wt  174 lb 6.4 oz (79.1 kg)   LMP 11/15/2014   SpO2 99%   BMI 34.06 kg/m   Wt Readings from Last 3 Encounters:  02/10/23 174 lb 6.4 oz (79.1 kg)  01/31/23 165 lb 8 oz (75.1 kg)  11/26/22 173 lb 9.6 oz (78.7 kg)    Physical Exam  Constitutional: Patient appears well-developed and well-nourished. Obese  No distress.  HEENT: head atraumatic, normocephalic, pupils equal and reactive to light, neck supple, throat within normal limits Cardiovascular: Normal rate, regular rhythm and normal heart sounds.  No murmur heard. No BLE edema. Pulmonary/Chest: Effort normal and breath sounds normal. No respiratory distress. Abdominal: Soft.  There is no tenderness. Psychiatric: Patient has a normal mood and affect. behavior is normal. Judgment and thought content normal.  Results for orders placed or performed in visit on 11/25/22  POCT glycosylated hemoglobin (Hb A1C)  Result Value Ref Range   Hemoglobin A1C 6.0 (A) 4.0 - 5.6 %   HbA1c POC (<> result, manual entry)     HbA1c, POC (prediabetic range)     HbA1c, POC (controlled diabetic range)        Assessment & Plan:   Problem List Items Addressed This Visit       Cardiovascular and Mediastinum   Essential hypertension    Blood pressure at goal. Continue losartan 25 mg daily      Relevant Medications   losartan (COZAAR) 25 MG tablet   ezetimibe (ZETIA) 10 MG tablet   atorvastatin (LIPITOR) 10 MG tablet   Other Relevant Orders   CBC with Differential/Platelet   COMPLETE METABOLIC PANEL WITH GFR     Respiratory   Asthma - Primary    Continue  albuterol, pataday, duoNeb, trelegy, flonase, astelin, getting allergy shots      Allergic rhinitis    Continue albuterol, pataday, duoNeb, trelegy, flonase, astelin, getting allergy shots        Endocrine   Type 2 diabetes mellitus with hyperglycemia, with long-term current use of insulin (HCC)    Increasing Ozempic to 0.5 mg weekly      Relevant Medications   losartan (COZAAR) 25 MG  tablet   atorvastatin (LIPITOR) 10 MG tablet   Semaglutide,0.25 or 0.5MG /DOS, (OZEMPIC, 0.25 OR 0.5 MG/DOSE,) 2 MG/3ML SOPN   Other Relevant Orders   COMPLETE METABOLIC PANEL WITH GFR   Hemoglobin A1c     Other   Class 1 obesity due to excess calories with serious comorbidity and body mass index (BMI) of 33.0 to 33.9 in adult    increase Ozempic to 0.5 mg weekly      Relevant Medications   Semaglutide,0.25 or 0.5MG /DOS, (OZEMPIC, 0.25 OR 0.5 MG/DOSE,) 2 MG/3ML SOPN   Hyperlipidemia    Continue atorvastatin 10 mg daily and Zetia 10 milligrams daily      Relevant Medications   losartan (COZAAR) 25 MG tablet   ezetimibe (ZETIA) 10 MG tablet   atorvastatin (LIPITOR) 10 MG tablet   Other Relevant Orders   COMPLETE METABOLIC PANEL WITH GFR   Lipid panel   Anxiety in acute stress reaction    Doing well on increased milligrams daily and BuSpar 10 mg twice daily as needed.      Relevant Medications   venlafaxine XR (EFFEXOR XR) 150 MG 24 hr capsule   busPIRone (BUSPAR) 10 MG tablet   Carcinoma of upper-outer quadrant of left breast in female, estrogen receptor positive (HCC)    Doing well no changes at this time.      Moderate episode of recurrent major depressive disorder (HCC)    Continue Effexor 150 mg daily and BuSpar 10 mg twice daily as needed.      Relevant Medications   venlafaxine XR (EFFEXOR XR) 150 MG 24 hr capsule   busPIRone (BUSPAR) 10 MG tablet   Other Visit Diagnoses     Medication management  Relevant Medications   losartan (COZAAR) 25 MG tablet          Follow up plan: Return in about 6 months (around 08/11/2023) for follow up.

## 2023-02-10 NOTE — Assessment & Plan Note (Signed)
Continue atorvastatin 10 mg daily and Zetia 10 milligrams daily

## 2023-02-10 NOTE — Assessment & Plan Note (Signed)
Doing well on increased milligrams daily and BuSpar 10 mg twice daily as needed.

## 2023-02-11 LAB — CBC WITH DIFFERENTIAL/PLATELET
Absolute Monocytes: 681 {cells}/uL (ref 200–950)
Basophils Absolute: 58 {cells}/uL (ref 0–200)
Basophils Relative: 0.7 %
Eosinophils Absolute: 324 {cells}/uL (ref 15–500)
Eosinophils Relative: 3.9 %
HCT: 37.6 % (ref 35.0–45.0)
Hemoglobin: 12.1 g/dL (ref 11.7–15.5)
Lymphs Abs: 2756 {cells}/uL (ref 850–3900)
MCH: 27.6 pg (ref 27.0–33.0)
MCHC: 32.2 g/dL (ref 32.0–36.0)
MCV: 85.8 fL (ref 80.0–100.0)
MPV: 9.9 fL (ref 7.5–12.5)
Monocytes Relative: 8.2 %
Neutro Abs: 4482 {cells}/uL (ref 1500–7800)
Neutrophils Relative %: 54 %
Platelets: 369 10*3/uL (ref 140–400)
RBC: 4.38 10*6/uL (ref 3.80–5.10)
RDW: 13.6 % (ref 11.0–15.0)
Total Lymphocyte: 33.2 %
WBC: 8.3 10*3/uL (ref 3.8–10.8)

## 2023-02-11 LAB — COMPLETE METABOLIC PANEL WITH GFR
AG Ratio: 1.3 (calc) (ref 1.0–2.5)
ALT: 18 U/L (ref 6–29)
AST: 17 U/L (ref 10–35)
Albumin: 3.9 g/dL (ref 3.6–5.1)
Alkaline phosphatase (APISO): 48 U/L (ref 37–153)
BUN: 15 mg/dL (ref 7–25)
CO2: 24 mmol/L (ref 20–32)
Calcium: 9.4 mg/dL (ref 8.6–10.4)
Chloride: 108 mmol/L (ref 98–110)
Creat: 0.86 mg/dL (ref 0.50–1.03)
Globulin: 2.9 g/dL (ref 1.9–3.7)
Glucose, Bld: 94 mg/dL (ref 65–99)
Potassium: 4.1 mmol/L (ref 3.5–5.3)
Sodium: 139 mmol/L (ref 135–146)
Total Bilirubin: 0.2 mg/dL (ref 0.2–1.2)
Total Protein: 6.8 g/dL (ref 6.1–8.1)
eGFR: 81 mL/min/{1.73_m2} (ref >=60)

## 2023-02-11 LAB — LIPID PANEL
Cholesterol: 204 mg/dL — ABNORMAL HIGH (ref <200)
HDL: 57 mg/dL (ref >=50)
LDL Cholesterol (Calc): 124 mg/dL — ABNORMAL HIGH
Non-HDL Cholesterol (Calc): 147 mg/dL — ABNORMAL HIGH (ref <130)
Total CHOL/HDL Ratio: 3.6 (calc) (ref <5.0)
Triglycerides: 124 mg/dL (ref <150)

## 2023-02-11 LAB — HEMOGLOBIN A1C
Hgb A1c MFr Bld: 6.5 %{Hb} — ABNORMAL HIGH (ref <5.7)
Mean Plasma Glucose: 140 mg/dL
eAG (mmol/L): 7.7 mmol/L

## 2023-02-13 ENCOUNTER — Other Ambulatory Visit (HOSPITAL_COMMUNITY): Payer: Self-pay

## 2023-02-13 ENCOUNTER — Ambulatory Visit: Payer: Commercial Managed Care - PPO | Admitting: Psychology

## 2023-02-13 DIAGNOSIS — H1045 Other chronic allergic conjunctivitis: Secondary | ICD-10-CM | POA: Diagnosis not present

## 2023-02-13 DIAGNOSIS — J3081 Allergic rhinitis due to animal (cat) (dog) hair and dander: Secondary | ICD-10-CM | POA: Diagnosis not present

## 2023-02-13 DIAGNOSIS — J4530 Mild persistent asthma, uncomplicated: Secondary | ICD-10-CM | POA: Diagnosis not present

## 2023-02-13 DIAGNOSIS — J3089 Other allergic rhinitis: Secondary | ICD-10-CM | POA: Diagnosis not present

## 2023-02-13 DIAGNOSIS — J301 Allergic rhinitis due to pollen: Secondary | ICD-10-CM | POA: Diagnosis not present

## 2023-02-13 MED ORDER — FLUTICASONE PROPIONATE 50 MCG/ACT NA SUSP
1.0000 | Freq: Every day | NASAL | 6 refills | Status: AC
  Filled 2023-02-13: qty 16, 30d supply, fill #0

## 2023-02-13 MED ORDER — AZELASTINE HCL 0.1 % NA SOLN
1.0000 | Freq: Two times a day (BID) | NASAL | 6 refills | Status: AC
  Filled 2023-02-13: qty 30, 30d supply, fill #0

## 2023-02-13 MED ORDER — TRELEGY ELLIPTA 200-62.5-25 MCG/ACT IN AEPB
1.0000 | INHALATION_SPRAY | Freq: Every day | RESPIRATORY_TRACT | 6 refills | Status: DC
  Filled 2023-02-13: qty 60, 30d supply, fill #0

## 2023-02-13 MED ORDER — LEVOCETIRIZINE DIHYDROCHLORIDE 5 MG PO TABS
5.0000 mg | ORAL_TABLET | Freq: Every evening | ORAL | 6 refills | Status: AC
  Filled 2023-02-13: qty 30, 30d supply, fill #0

## 2023-02-13 MED ORDER — ALBUTEROL SULFATE HFA 108 (90 BASE) MCG/ACT IN AERS
1.0000 | INHALATION_SPRAY | RESPIRATORY_TRACT | 0 refills | Status: DC | PRN
  Filled 2023-02-13: qty 6.7, 30d supply, fill #0

## 2023-02-13 MED ORDER — MONTELUKAST SODIUM 10 MG PO TABS
10.0000 mg | ORAL_TABLET | Freq: Every evening | ORAL | 6 refills | Status: DC
  Filled 2023-02-13: qty 30, 30d supply, fill #0

## 2023-02-17 ENCOUNTER — Ambulatory Visit (INDEPENDENT_AMBULATORY_CARE_PROVIDER_SITE_OTHER): Payer: Commercial Managed Care - PPO | Admitting: Psychology

## 2023-02-17 DIAGNOSIS — F4322 Adjustment disorder with anxiety: Secondary | ICD-10-CM

## 2023-02-17 DIAGNOSIS — F4321 Adjustment disorder with depressed mood: Secondary | ICD-10-CM | POA: Diagnosis not present

## 2023-02-17 NOTE — Progress Notes (Signed)
[facility] Counselor/Therapist Progress Note  Patient ID: Tracey Huff, MRN: [id number],    Date: 02/17/2023  Time Spent: 12:04pm-12:45pm     Treatment Type: Individual Therapy   pt is seen for virtual audio visit via phone.  Pt's phone wouldn't work w/ caregility and therefore unable to do the video visit.  Pt consents to virtual visit and is aware of limitations of such visits. Pt joins from her parked car, reporting privacy, and counselor from her home office.   Reported Symptoms: sadness w/ grieving,  some worry, sleep improving  Mental Status Exam: Appearance:  N/a      Behavior: Appropriate  Motor: n/a  Speech/Language:  Clear and Coherent  Affect: Appropriate  Mood: anxious and sad  Thought process: normal  Thought content:   WNL  Sensory/Perceptual disturbances:   WNL  Orientation: oriented to person, place, time/date, and situation  Attention: Good  Concentration: Good  Memory: WNL  Fund of knowledge:  Good  Insight:   Good  Judgment:  Good  Impulse Control: Good   Risk Assessment: Danger to Self:  No Self-injurious Behavior: No Danger to Others: No Duty to Warn:no Physical Aggression / Violence:No  Access to Firearms a concern: No  Gang Involvement:No   Subjective: Counselor assessed pt current functioning per pt report.  Processed w/pt mood and grief.  Explored return to work and some feeling of lack of support from coworkers.  Discussed hurt and healthy boundaries/relationships to put energy into.  Discussed self care and continue connect w/ her support sytem.  Pt affect wnl.  Pt reports her anxiety is decreasing and no need for PRN meds.  Pt reports she has returned to work and has felt some hurt from limited support of coworkers who she felt she was there to support when they needed.  Pt recognized need to focus on her own self care and the relationships that are reciprocal.  Pt discussed some difficult days w/ grief and reminders of husband  and some days ok.  Pt reports overall she is sleeping ok when takes her daily meds.  Pt discussed thing she is busy w/ husband's estate.  Pt acknowledged some avoidance of being home alone.   Interventions: Cognitive Behavioral Therapy and supportive  Diagnosis:Adjustment disorder with anxiety  Grief  Plan: Pt f/u in 1 week for counseling.  Pt to f/u a scheduled w/ PCP.    Individualized Treatment Plan Strengths: seeking counseling   Supports: daughters, friends    Goal/Needs for Treatment:  In order of importance to patient 1) increase appropriate expression of feelings.   2) cope w/ stressors 3) ---    Client Statement of Needs: "I do things to cover up and not feel.  Creating safe space to talk.  Trying to find myself- for last 4 years I had to take care of him and put my self off. "    Treatment Level: outpt counseling  Symptoms: feeling anxious/on edge, worry, irritability  Client Treatment Preferences:biweekly counseling. Continue med management w/ PCP    Healthcare consumer's goal for treatment:   Counselor, Forde Radon, [facility] will support the patient's ability to achieve the goals identified. Cognitive Behavioral Therapy, Assertive Communication/Conflict Resolution Training, Relaxation Training, ACT, Humanistic and other evidenced-based practices will be used to promote progress towards healthy functioning.    Healthcare consumer will: Actively participate in therapy, working towards healthy functioning.     *Justification for Continuation/Discontinuation of Goal: R=Revised, O=Ongoing, A=Achieved, D=Discontinued   Goal 1) Increase pt  verbal expression of feelings and emotions. Baseline date 09/10/22: Progress towards goal 0; How Often - Daily Target Date Goal Was reviewed Status Code Progress towards goal/Likert rating  09/10/23                            Goal 2) Increase self care and coping skills daily to manage stress to reduce anxiety and irritability AEB Pt report  and therapist observation. Baseline date 09/10/22: Progress towards goal 0; How Often - Daily Target Date Goal Was reviewed Status Code Progress towards goal  09/10/23                            This plan has been reviewed and created by the following participants:  This plan will be reviewed at least every 12 months. Date Behavioral Health Clinician Date Guardian/Patient   09/10/22          [facility] [name] [facility] 09/10/22 Verbal Consent Provided                            Forde Radon [facility]

## 2023-02-18 DIAGNOSIS — J3089 Other allergic rhinitis: Secondary | ICD-10-CM | POA: Diagnosis not present

## 2023-02-18 DIAGNOSIS — J301 Allergic rhinitis due to pollen: Secondary | ICD-10-CM | POA: Diagnosis not present

## 2023-02-18 DIAGNOSIS — J3081 Allergic rhinitis due to animal (cat) (dog) hair and dander: Secondary | ICD-10-CM | POA: Diagnosis not present

## 2023-02-21 DIAGNOSIS — J301 Allergic rhinitis due to pollen: Secondary | ICD-10-CM | POA: Diagnosis not present

## 2023-02-21 DIAGNOSIS — J3081 Allergic rhinitis due to animal (cat) (dog) hair and dander: Secondary | ICD-10-CM | POA: Diagnosis not present

## 2023-02-21 DIAGNOSIS — J3089 Other allergic rhinitis: Secondary | ICD-10-CM | POA: Diagnosis not present

## 2023-02-24 ENCOUNTER — Ambulatory Visit (INDEPENDENT_AMBULATORY_CARE_PROVIDER_SITE_OTHER): Payer: Commercial Managed Care - PPO | Admitting: Psychology

## 2023-02-24 DIAGNOSIS — F4322 Adjustment disorder with anxiety: Secondary | ICD-10-CM | POA: Diagnosis not present

## 2023-02-24 DIAGNOSIS — F4321 Adjustment disorder with depressed mood: Secondary | ICD-10-CM | POA: Diagnosis not present

## 2023-02-24 NOTE — Progress Notes (Signed)
[facility] Counselor/Therapist Progress Note  Patient ID: Tracey Huff, MRN: [id number],    Date: 02/24/2023  Time Spent: 12:03pm-12:36pm     Treatment Type: Individual Therapy   pt is seen for virtual audio visit via phone.  Pt's phone wouldn't work w/ caregility and therefore unable to do the video visit.  Pt consents to virtual visit and is aware of limitations of such visits. 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   Reported Symptoms: sleeping well, pt reported engaged in community over weekend.    Mental Status Exam: Appearance:  N/a      Behavior: Appropriate  Motor: n/a  Speech/Language:  Clear and Coherent  Affect: Appropriate  Mood: sad  Thought process: normal  Thought content:   WNL  Sensory/Perceptual disturbances:   WNL  Orientation: oriented to person, place, time/date, and situation  Attention: Good  Concentration: Good  Memory: WNL  Fund of knowledge:  Good  Insight:   Good  Judgment:  Good  Impulse Control: Good   Risk Assessment: Danger to Self:  No Self-injurious Behavior: No Danger to Others: No Duty to Warn:no Physical Aggression / Violence:No  Access to Firearms a concern: No  Gang Involvement:No   Subjective: Counselor assessed pt current functioning per pt report.  Processed w/pt mood and grief.  Explored engagement w/ supports and community.  Discussed emotions, grief and healthy boundaries for self.  Pt affect wnl.  Pt reports her anxiety is decreased and sleeping well.  Pt reported that she had a good weekend engaging w/ church community and programs to be involved in.  Pt reported she is keeping boundaries for self and feeling good about.  Pt discussed time for her healing and rediscovery self w/out being a caregiver.   Interventions: Cognitive Behavioral Therapy and supportive  Diagnosis:Adjustment disorder with anxiety  Grief  Plan: Pt f/u in 1 week for counseling.  Pt to f/u a  scheduled w/ PCP.    Individualized Treatment Plan Strengths: seeking counseling   Supports: daughters, friends    Goal/Needs for Treatment:  In order of importance to patient 1) increase appropriate expression of feelings.   2) cope w/ stressors 3) ---    Client Statement of Needs: "I do things to cover up and not feel.  Creating safe space to talk.  Trying to find myself- for last 4 years I had to take care of him and put my self off. "    Treatment Level: outpt counseling  Symptoms: feeling anxious/on edge, worry, irritability  Client Treatment Preferences:biweekly counseling. Continue med management w/ PCP    Healthcare consumer's goal for treatment:   Counselor, Forde Radon, [facility] will support the patient's ability to achieve the goals identified. Cognitive Behavioral Therapy, Assertive Communication/Conflict Resolution Training, Relaxation Training, ACT, Humanistic and other evidenced-based practices will be used to promote progress towards healthy functioning.    Healthcare consumer will: Actively participate in therapy, working towards healthy functioning.     *Justification for Continuation/Discontinuation of Goal: R=Revised, O=Ongoing, A=Achieved, D=Discontinued   Goal 1) Increase pt verbal expression of feelings and emotions. Baseline date 09/10/22: Progress towards goal 0; How Often - Daily Target Date Goal Was reviewed Status Code Progress towards goal/Likert rating  09/10/23                            Goal 2) Increase self care and coping skills daily to manage stress to reduce  anxiety and irritability AEB Pt report and therapist observation. Baseline date 09/10/22: Progress towards goal 0; How Often - Daily Target Date Goal Was reviewed Status Code Progress towards goal  09/10/23                            This plan has been reviewed and created by the following participants:  This plan will be reviewed at least every 12 months. Date Behavioral Health Clinician Date  Guardian/Patient   09/10/22          [facility] [name] [facility] 09/10/22 Verbal Consent Provided                             Forde Radon [facility]

## 2023-02-26 DIAGNOSIS — F4320 Adjustment disorder, unspecified: Secondary | ICD-10-CM | POA: Diagnosis not present

## 2023-02-27 ENCOUNTER — Other Ambulatory Visit (HOSPITAL_COMMUNITY): Payer: Self-pay

## 2023-02-28 ENCOUNTER — Ambulatory Visit: Payer: Commercial Managed Care - PPO | Admitting: Nurse Practitioner

## 2023-02-28 DIAGNOSIS — J3081 Allergic rhinitis due to animal (cat) (dog) hair and dander: Secondary | ICD-10-CM | POA: Diagnosis not present

## 2023-02-28 DIAGNOSIS — J301 Allergic rhinitis due to pollen: Secondary | ICD-10-CM | POA: Diagnosis not present

## 2023-02-28 DIAGNOSIS — J3089 Other allergic rhinitis: Secondary | ICD-10-CM | POA: Diagnosis not present

## 2023-03-05 DIAGNOSIS — F4320 Adjustment disorder, unspecified: Secondary | ICD-10-CM | POA: Diagnosis not present

## 2023-03-07 DIAGNOSIS — J301 Allergic rhinitis due to pollen: Secondary | ICD-10-CM | POA: Diagnosis not present

## 2023-03-07 DIAGNOSIS — J3081 Allergic rhinitis due to animal (cat) (dog) hair and dander: Secondary | ICD-10-CM | POA: Diagnosis not present

## 2023-03-07 DIAGNOSIS — J3089 Other allergic rhinitis: Secondary | ICD-10-CM | POA: Diagnosis not present

## 2023-03-10 ENCOUNTER — Ambulatory Visit (INDEPENDENT_AMBULATORY_CARE_PROVIDER_SITE_OTHER): Payer: Commercial Managed Care - PPO | Admitting: Psychology

## 2023-03-10 DIAGNOSIS — F4323 Adjustment disorder with mixed anxiety and depressed mood: Secondary | ICD-10-CM | POA: Diagnosis not present

## 2023-03-10 DIAGNOSIS — F4321 Adjustment disorder with depressed mood: Secondary | ICD-10-CM

## 2023-03-10 DIAGNOSIS — F4322 Adjustment disorder with anxiety: Secondary | ICD-10-CM

## 2023-03-10 NOTE — Progress Notes (Signed)
[name] Counselor/Therapist Progress Note  Patient ID: Tracey Huff, MRN: [id number],    Date: 03/10/2023  Time Spent: 1:34pm-2:09pm     Treatment Type: Individual Therapy   pt is seen for virtual audio visit via phone.  Pt's phone wouldn't work w/ caregility and therefore unable to do the video visit.  Pt consents to virtual visit and is aware of limitations of such visits. 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   Reported Symptoms:  pt reported engaged in community over weekend, pt reported some days good w/ mood, other days down.  Pt reported awareness of need to focus on taking care of self.   Mental Status Exam: Appearance:  N/a      Behavior: Appropriate  Motor: n/a  Speech/Language:  Clear and Coherent  Affect: Appropriate  Mood: sad  Thought process: normal  Thought content:   WNL  Sensory/Perceptual disturbances:   WNL  Orientation: oriented to person, place, time/date, and situation  Attention: Good  Concentration: Good  Memory: WNL  Fund of knowledge:  Good  Insight:   Good  Judgment:  Good  Impulse Control: Good   Risk Assessment: Danger to Self:  No Self-injurious Behavior: No Danger to Others: No Duty to Warn:no Physical Aggression / Violence:No  Access to Firearms a concern: No  Gang Involvement:No   Subjective: Counselor assessed pt current functioning per pt report.  Processed w/pt mood, interactions and grief.  Explored positive connections w/ supports and things she enjoys.    Discussed self reflection and focus on taking care of self.  Pt affect wnl.  Pt reports mood vary from good to down/sad.  Pt reported good weekend w/ women's luncheon attended and self reflection.  Pt reports friends invited on friends trip in December to [place] however pt feels she may not be ready and needs more time to explore who she is at this time. Pt reports some frustration w/ husband's family asking about inheritance  and expectation that more than had.  Pt discussed focus on taking care of self.  Pt explored possibilities for upcoming holidays.    Interventions: Cognitive Behavioral Therapy and supportive  Diagnosis:Adjustment disorder with anxiety  Grief  Plan: Pt f/u in 2 weeks for counseling.  Pt to f/u a scheduled w/ PCP.    Individualized Treatment Plan Strengths: seeking counseling   Supports: daughters, friends    Goal/Needs for Treatment:  In order of importance to patient 1) increase appropriate expression of feelings.   2) cope w/ stressors 3) ---    Client Statement of Needs: "I do things to cover up and not feel.  Creating safe space to talk.  Trying to find myself- for last 4 years I had to take care of him and put my self off. "    Treatment Level: outpt counseling  Symptoms: feeling anxious/on edge, worry, irritability  Client Treatment Preferences:biweekly counseling. Continue med management w/ PCP    Healthcare consumer's goal for treatment:   Counselor, Forde Radon, [facility] will support the patient's ability to achieve the goals identified. Cognitive Behavioral Therapy, Assertive Communication/Conflict Resolution Training, Relaxation Training, ACT, Humanistic and other evidenced-based practices will be used to promote progress towards healthy functioning.    Healthcare consumer will: Actively participate in therapy, working towards healthy functioning.     *Justification for Continuation/Discontinuation of Goal: R=Revised, O=Ongoing, A=Achieved, D=Discontinued   Goal 1) Increase pt verbal expression of feelings and emotions. Baseline date 09/10/22: Progress towards goal  0; How Often - Daily Target Date Goal Was reviewed Status Code Progress towards goal/Likert rating  09/10/23                            Goal 2) Increase self care and coping skills daily to manage stress to reduce anxiety and irritability AEB Pt report and therapist observation. Baseline date 09/10/22: Progress  towards goal 0; How Often - Daily Target Date Goal Was reviewed Status Code Progress towards goal  09/10/23                            This plan has been reviewed and created by the following participants:  This plan will be reviewed at least every 12 months. Date Behavioral Health Clinician Date Guardian/Patient   09/10/22          [facility] [name] [facility] 09/10/22 Verbal Consent Provided                          Forde Radon [facility]

## 2023-03-11 DIAGNOSIS — J3081 Allergic rhinitis due to animal (cat) (dog) hair and dander: Secondary | ICD-10-CM | POA: Diagnosis not present

## 2023-03-11 DIAGNOSIS — J3089 Other allergic rhinitis: Secondary | ICD-10-CM | POA: Diagnosis not present

## 2023-03-11 DIAGNOSIS — J301 Allergic rhinitis due to pollen: Secondary | ICD-10-CM | POA: Diagnosis not present

## 2023-03-12 ENCOUNTER — Encounter: Payer: Self-pay | Admitting: Dermatology

## 2023-03-12 ENCOUNTER — Other Ambulatory Visit (HOSPITAL_COMMUNITY): Payer: Self-pay

## 2023-03-12 ENCOUNTER — Ambulatory Visit: Payer: Commercial Managed Care - PPO | Admitting: Dermatology

## 2023-03-12 DIAGNOSIS — K130 Diseases of lips: Secondary | ICD-10-CM | POA: Diagnosis not present

## 2023-03-12 DIAGNOSIS — L259 Unspecified contact dermatitis, unspecified cause: Secondary | ICD-10-CM | POA: Diagnosis not present

## 2023-03-12 DIAGNOSIS — Z79899 Other long term (current) drug therapy: Secondary | ICD-10-CM | POA: Diagnosis not present

## 2023-03-12 DIAGNOSIS — R21 Rash and other nonspecific skin eruption: Secondary | ICD-10-CM | POA: Diagnosis not present

## 2023-03-12 DIAGNOSIS — Z7189 Other specified counseling: Secondary | ICD-10-CM

## 2023-03-12 DIAGNOSIS — L819 Disorder of pigmentation, unspecified: Secondary | ICD-10-CM | POA: Diagnosis not present

## 2023-03-12 MED ORDER — OPZELURA 1.5 % EX CREA
1.0000 | TOPICAL_CREAM | Freq: Two times a day (BID) | CUTANEOUS | 11 refills | Status: DC
Start: 2023-03-12 — End: 2024-03-11
  Filled 2023-03-12: qty 60, 30d supply, fill #0

## 2023-03-12 NOTE — Patient Instructions (Addendum)
Continue Opzelura cream to aa's once or twice daily.   Continue Xyzal 5 mg  by mouth at bedtime as prescribed     Gentle Skin Care Guide  1. Bathe no more than once a day.  2. Avoid bathing in hot water  3. Use a mild soap like Dove, Vanicream, Cetaphil, CeraVe. Can use Lever 2000 or Cetaphil antibacterial soap  4. Use soap only where you need it. On most days, use it under your arms, between your legs, and on your feet. Let the water rinse other areas unless visibly dirty.  5. When you get out of the bath/shower, use a towel to gently blot your skin dry, don't rub it.  6. While your skin is still a little damp, apply a moisturizing cream such as Vanicream, CeraVe, Cetaphil, Eucerin, Sarna lotion or plain Vaseline Jelly. For hands apply Neutrogena [place] Hand Cream or Excipial Hand Cream.  7. Reapply moisturizer any time you start to itch or feel dry.  8. Sometimes using free and clear laundry detergents can be helpful. Fabric softener sheets should be avoided. Downy Free & Gentle liquid, or any liquid fabric softener that is free of dyes and perfumes, it acceptable to use  9. If your doctor has given you prescription creams you may apply moisturizers over them      Due to recent changes in healthcare laws, you may see results of your pathology and/or laboratory studies on MyChart before the doctors have had a chance to review them. We understand that in some cases there may be results that are confusing or concerning to you. Please understand that not all results are received at the same time and often the doctors may need to interpret multiple results in order to provide you with the best plan of care or course of treatment. Therefore, we ask that you please give [place] 2 business days to thoroughly review all your results before contacting the office for clarification. Should we see a critical lab result, you will be contacted sooner.   If You Need Anything After Your Visit  If  you have any questions or concerns for your doctor, please call our main line at [phone] and press option 4 to reach your doctor's medical assistant. If no one answers, please leave a voicemail as directed and we will return your call as soon as possible. Messages left after 4 pm will be answered the following business day.   You may also send [place] a message via MyChart. We typically respond to MyChart messages within 1-2 business days.  For prescription refills, please ask your pharmacy to contact our office. Our fax number is [phone].  If you have an urgent issue when the clinic is closed that cannot wait until the next business day, you can page your doctor at the number below.    Please note that while we do our best to be available for urgent issues outside of office hours, we are not available 24/7.   If you have an urgent issue and are unable to reach [place], you may choose to seek medical care at your doctor's office, retail clinic, urgent care center, or emergency room.  If you have a medical emergency, please immediately call 911 or go to the emergency department.  Pager Numbers  - Dr. Gwen Pounds: [phone]  - Dr. Roseanne Reno: [phone]  - Dr. Katrinka Blazing: [phone]   In the event of inclement weather, please call our main line at [phone] for an update on the status of  any delays or closures.  Dermatology Medication Tips: Please keep the boxes that topical medications come in in order to help keep track of the instructions about where and how to use these. Pharmacies typically print the medication instructions only on the boxes and not directly on the medication tubes.   If your medication is too expensive, please contact our office at [phone] option 4 or send [place] a message through MyChart.   We are unable to tell what your co-pay for medications will be in advance as this is different depending on your insurance coverage. However, we may be able to find a substitute  medication at lower cost or fill out paperwork to get insurance to cover a needed medication.   If a prior authorization is required to get your medication covered by your insurance company, please allow [place] 1-2 business days to complete this process.  Drug prices often vary depending on where the prescription is filled and some pharmacies may offer cheaper prices.  The website www.goodrx.com contains coupons for medications through different pharmacies. The prices here do not account for what the cost may be with help from insurance (it may be cheaper with your insurance), but the website can give you the price if you did not use any insurance.  - You can print the associated coupon and take it with your prescription to the pharmacy.  - You may also stop by our office during regular business hours and pick up a GoodRx coupon card.  - If you need your prescription sent electronically to a different pharmacy, notify our office through [facility] or by phone at [phone] option 4.     Si Usted Necesita Algo Despus de Su Visita  Tambin puede enviarnos un mensaje a travs de Clinical cytogeneticist. Por lo general respondemos a los mensajes de MyChart en el transcurso de 1 a 2 das hbiles.  Para renovar recetas, por favor pida a su farmacia que se ponga en contacto con nuestra oficina. Annie Sable de fax es [place] [phone].  Si tiene un asunto urgente cuando la clnica est cerrada y que no puede esperar hasta el siguiente da hbil, puede llamar/localizar a su doctor(a) al nmero que aparece a continuacin.   Por favor, tenga en cuenta que aunque hacemos todo lo posible para estar disponibles para asuntos urgentes fuera del horario de [place], no estamos disponibles las 24 horas del da, los 7 [address]  [address] de la [place].   Si tiene un problema urgente y no puede comunicarse con nosotros, puede optar por buscar atencin mdica  en el consultorio de su doctor(a), en una clnica privada, en un centro de  atencin urgente o en una sala de emergencias.  Si tiene Engineer, drilling, por favor llame inmediatamente al 911 o vaya a la sala de emergencias.  Nmeros de bper  - Dr. Gwen Pounds: [phone]  - Dra. Roseanne Reno: [phone]  - Dr. Katrinka Blazing: [phone]   En caso de inclemencias del tiempo, por favor llame a Lacy Duverney principal al [phone] para una actualizacin sobre el [place] de cualquier retraso o cierre.  Consejos para la medicacin en dermatologa: Por favor, guarde las cajas en las que vienen los medicamentos de uso tpico para ayudarle a seguir las instrucciones sobre dnde y cmo usarlos. Las farmacias generalmente imprimen las instrucciones del medicamento slo en las cajas y no directamente en los tubos del [place].   Si su medicamento es muy caro, por favor, pngase en contacto con Rolm Gala llamando al [phone] y presione  la opcin 4 o envenos un mensaje a travs de MyChart.   No podemos decirle cul ser su copago por los medicamentos por adelantado ya que esto es diferente dependiendo de la cobertura de su seguro. Sin embargo, es posible que podamos encontrar un medicamento sustituto a Audiological scientist un formulario para que el seguro cubra el medicamento que se considera necesario.   Si se requiere una autorizacin previa para que su compaa de seguros [place] su medicamento, por favor permtanos de 1 a 2 das hbiles para completar [address]  Los precios de los medicamentos varan con frecuencia dependiendo del Environmental consultant de dnde se surte la receta y alguna farmacias pueden ofrecer precios ms baratos.  El sitio web www.goodrx.com tiene cupones para medicamentos de Health and safety inspector. Los precios aqu no tienen en cuenta lo que podra costar con la ayuda del seguro (puede ser ms barato con su seguro), pero el sitio web puede darle el precio si no utiliz Tourist information centre manager.  - Puede imprimir el cupn correspondiente y llevarlo con su receta a la  farmacia.  - Tambin puede pasar por nuestra oficina durante el horario de atencin regular y Education officer, museum una tarjeta de cupones de GoodRx.  - Si necesita que su receta se enve electrnicamente a una farmacia diferente, informe a nuestra oficina a travs de MyChart de [place] o por telfono llamando al [phone] y presione la opcin 4.

## 2023-03-12 NOTE — Progress Notes (Signed)
   Follow-Up Visit   Subjective  Tracey Huff is a 52 y.o. female who presents for the following: 4 month cheilitis follow up. Has been using Opzelura and taking Xyzal as directed. States lip is improving. No more swelling or oozing. Denies GI symptoms.   The patient has spots, moles and lesions to be evaluated, some may be new or changing and the patient may have concern these could be cancer.   The following portions of the chart were reviewed this encounter and updated as appropriate: medications, allergies, medical history  Review of Systems:  No other skin or systemic complaints except as noted in HPI or Assessment and Plan.  Objective  Well appearing patient in no apparent distress; mood and affect are within normal limits.  A focused examination was performed of the following areas: Face, neck  Relevant exam findings are noted in the Assessment and Plan.       Assessment & Plan   Rash - allergic contact dermatitis likely due to foods/spices/peppers Causing Cheilitis and Dyschromia Chronic and persistent condition with duration or expected duration over one year. Condition is bothersome/symptomatic for patient. Currently flared.   But has overall been better. 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   History of  allergy testing prior that showed positive allergy  to trees, grass, weed, ragweed, mold, dust mites, cockroach, dog, cat, feathers. Negative to walnut, peanut, pecan, cashew, almond, [place] nut, wheat, milk, egg, trout, oyster, shrimp, flounder, tuna, clam and catfish, crab and soy. 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   Patient  seen by allergist Dr. Selena Batten at [facility] on 11/13/2022 and here were her recommendations  - Did not recommend RAST testing  - Recommended to avoid spicy foods and acidic foods as those tend to irritate sensitive skin. - Recommended to take pictures when rash/swelling - Recommended to start Xyzal 5 mg at night to help with itching  - Recommend Avoiding the following potential triggers: alcohol, tight clothing, NSAIDs, hot showers and getting overheated. See below for proper skin care.  Use fragrance free and dye free products. Don't use mint products.  Try kids flouride free toothpaste.  - Recommended to get blood work to rule out etiologies   My recommendations today are to :   Continue Opzelura cream to aa's BID.  Continue Xyzal 5 mg  po qhs as prescribed  Consider RAST testing in future if Allergist feels appropriate.  Rash  Related Procedures Alpha-Gal Panel  Contact dermatitis, unspecified contact dermatitis type, unspecified trigger  Related Procedures Alpha-Gal Panel    Return in about 1 year (around 03/11/2024) for Rash Follow Up.  I, Lawson Radar, CMA, am acting as scribe for Armida Sans, MD.   Documentation: I have reviewed the above documentation for accuracy and completeness, and I agree with the above.  Armida Sans, MD

## 2023-03-13 DIAGNOSIS — J301 Allergic rhinitis due to pollen: Secondary | ICD-10-CM | POA: Diagnosis not present

## 2023-03-13 DIAGNOSIS — J3089 Other allergic rhinitis: Secondary | ICD-10-CM | POA: Diagnosis not present

## 2023-03-13 DIAGNOSIS — J3081 Allergic rhinitis due to animal (cat) (dog) hair and dander: Secondary | ICD-10-CM | POA: Diagnosis not present

## 2023-03-18 DIAGNOSIS — F4320 Adjustment disorder, unspecified: Secondary | ICD-10-CM | POA: Diagnosis not present

## 2023-03-21 DIAGNOSIS — J301 Allergic rhinitis due to pollen: Secondary | ICD-10-CM | POA: Diagnosis not present

## 2023-03-21 DIAGNOSIS — J3089 Other allergic rhinitis: Secondary | ICD-10-CM | POA: Diagnosis not present

## 2023-03-21 DIAGNOSIS — J3081 Allergic rhinitis due to animal (cat) (dog) hair and dander: Secondary | ICD-10-CM | POA: Diagnosis not present

## 2023-03-24 ENCOUNTER — Ambulatory Visit (INDEPENDENT_AMBULATORY_CARE_PROVIDER_SITE_OTHER): Payer: Commercial Managed Care - PPO | Admitting: Psychology

## 2023-03-24 DIAGNOSIS — F4321 Adjustment disorder with depressed mood: Secondary | ICD-10-CM

## 2023-03-24 DIAGNOSIS — F4322 Adjustment disorder with anxiety: Secondary | ICD-10-CM | POA: Diagnosis not present

## 2023-03-24 NOTE — Progress Notes (Signed)
[facility] Counselor/Therapist Progress Note  Patient ID: LEVERA FISHELL, MRN: [id number],    Date: 03/24/2023  Time Spent: 2:31pm-3:20pm     Treatment Type: Individual Therapy   pt is seen for virtual audio visit via phone.  Pt's phone wouldn't work w/ caregility and therefore unable to do the video visit.  Pt consents to virtual visit and is aware of limitations of such visits. 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   Reported Symptoms:  pt reported grieving and uncertainty w/ holiday's approaching.  Pt reported working focus for her self care.   Mental Status Exam: Appearance:  N/a      Behavior: Appropriate  Motor: n/a  Speech/Language:  Clear and Coherent  Affect: Appropriate  Mood: sad  Thought process: normal  Thought content:   WNL  Sensory/Perceptual disturbances:   WNL  Orientation: oriented to person, place, time/date, and situation  Attention: Good  Concentration: Good  Memory: WNL  Fund of knowledge:  Good  Insight:   Good  Judgment:  Good  Impulse Control: Good   Risk Assessment: Danger to Self:  No Self-injurious Behavior: No Danger to Others: No Duty to Warn:no Physical Aggression / Violence:No  Access to Firearms a concern: No  Gang Involvement:No   Subjective: Counselor assessed pt current functioning per pt report.  Processed w/pt moods and grieving.  Explored upcoming holidays and plans considering and past traditions w/ husband.  Discussed ways to be connected w/ relationships/family for these holidays and aware of grief.  Explored ways she is taking care of self.  Pt affect wnl.  Pt reports moods vary- w/ some anxiety and worry w/ holidays, some sadness and some ok days.  Pt reports support of her pastor, friends and family.  Pt reported that she is not going w/ daughter to beach as doesn't feel will be comfortable for w/ everyone that will be present.  Pt discussed options for her extended family and  w/ friends.  Pt discussed how holidays were w/ husband and feeling mix of decorating or not.  Pt recognized that grandkids will enjoy decorations and wants to be present w/ them. Pt discussed how learning to focus on herself as always taking care of others.    Interventions: Cognitive Behavioral Therapy and supportive  Diagnosis:Adjustment disorder with anxiety  Grief  Plan: Pt f/u in 2 weeks for counseling.  Pt to f/u a scheduled w/ PCP.    Individualized Treatment Plan Strengths: seeking counseling   Supports: daughters, friends    Goal/Needs for Treatment:  In order of importance to patient 1) increase appropriate expression of feelings.   2) cope w/ stressors 3) ---    Client Statement of Needs: "I do things to cover up and not feel.  Creating safe space to talk.  Trying to find myself- for last 4 years I had to take care of him and put my self off. "    Treatment Level: outpt counseling  Symptoms: feeling anxious/on edge, worry, irritability  Client Treatment Preferences:biweekly counseling. Continue med management w/ PCP    Healthcare consumer's goal for treatment:   Counselor, Forde Radon, [facility] will support the patient's ability to achieve the goals identified. Cognitive Behavioral Therapy, Assertive Communication/Conflict Resolution Training, Relaxation Training, ACT, Humanistic and other evidenced-based practices will be used to promote progress towards healthy functioning.    Healthcare consumer will: Actively participate in therapy, working towards healthy functioning.     *Justification for Continuation/Discontinuation of Goal:  R=Revised, O=Ongoing, A=Achieved, D=Discontinued   Goal 1) Increase pt verbal expression of feelings and emotions. Baseline date 09/10/22: Progress towards goal 0; How Often - Daily Target Date Goal Was reviewed Status Code Progress towards goal/Likert rating  09/10/23                            Goal 2) Increase self care and coping skills  daily to manage stress to reduce anxiety and irritability AEB Pt report and therapist observation. Baseline date 09/10/22: Progress towards goal 0; How Often - Daily Target Date Goal Was reviewed Status Code Progress towards goal  09/10/23                            This plan has been reviewed and created by the following participants:  This plan will be reviewed at least every 12 months. Date Behavioral Health Clinician Date Guardian/Patient   09/10/22          [facility] [name] [facility] 09/10/22 Verbal Consent Provided                            Forde Radon [facility]

## 2023-03-25 DIAGNOSIS — J301 Allergic rhinitis due to pollen: Secondary | ICD-10-CM | POA: Diagnosis not present

## 2023-03-25 DIAGNOSIS — J3081 Allergic rhinitis due to animal (cat) (dog) hair and dander: Secondary | ICD-10-CM | POA: Diagnosis not present

## 2023-03-25 DIAGNOSIS — J3089 Other allergic rhinitis: Secondary | ICD-10-CM | POA: Diagnosis not present

## 2023-03-26 ENCOUNTER — Encounter: Payer: Self-pay | Admitting: Surgery

## 2023-03-26 ENCOUNTER — Ambulatory Visit: Payer: Commercial Managed Care - PPO | Admitting: Surgery

## 2023-03-26 VITALS — BP 137/90 | HR 86 | Temp 98.6°F | Ht 60.0 in | Wt 175.4 lb

## 2023-03-26 DIAGNOSIS — R200 Anesthesia of skin: Secondary | ICD-10-CM

## 2023-03-26 DIAGNOSIS — F4320 Adjustment disorder, unspecified: Secondary | ICD-10-CM | POA: Diagnosis not present

## 2023-03-26 DIAGNOSIS — C50812 Malignant neoplasm of overlapping sites of left female breast: Secondary | ICD-10-CM

## 2023-03-26 DIAGNOSIS — Z170 Estrogen receptor positive status [ER+]: Secondary | ICD-10-CM | POA: Diagnosis not present

## 2023-03-26 DIAGNOSIS — Z09 Encounter for follow-up examination after completed treatment for conditions other than malignant neoplasm: Secondary | ICD-10-CM | POA: Diagnosis not present

## 2023-03-26 NOTE — Progress Notes (Signed)
Outpatient Surgical Follow Up  03/26/2023  Tracey Huff is an 52 y.o. female.   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  She does have some tingling on both hands seems to be worse on the left side.  No fevers no chills no weight loss. She lost her husband from glioblastoma multiform .  I did express my condolences and provided supportive therapy  Past Medical History:  Diagnosis Date   Allergy    Anxiety    Asthma    Breast cancer (HCC)    INVASIVE MAMMARY CARCINOMA- left   Family history of prostate cancer    Fibroid uterus 11/15/2014   Hyperlipidemia    Hypertension    Menorrhagia with irregular cycle 11/15/2014   Pre-diabetes    Urticaria     Past Surgical History:  Procedure Laterality Date   ABDOMINAL HYSTERECTOMY N/A 11/15/2014   Procedure: HYSTERECTOMY ABDOMINAL/BILATERAL SALPINGECTOMY ;  Surgeon: Conard Novak, MD;  Location: [facility] OR[facility];  Service: Gynecology;  Laterality: N/A;   BREAST BIOPSY Right    2019 negative   BREAST BIOPSY Left 03/09/2021   u/s biopsy, 12-1 o'clock, VENUS" clip-path pending   BREAST LUMPECTOMY,RADIO FREQ LOCALIZER,AXILLARY SENTINEL LYMPH NODE BIOPSY Left 03/28/2021   Procedure: BREAST LUMPECTOMY,RADIO FREQ LOCALIZER,AXILLARY SENTINEL LYMPH NODE BIOPSY;  Surgeon: Leafy Ro, MD;  Location: [facility];  Service: General;  Laterality: Left;   BREAST RECONSTRUCTION WITH PLACEMENT OF TISSUE EXPANDER AND FLEX HD (ACELLULAR HYDRATED DERMIS) Bilateral 05/15/2021   Procedure: BILATERAL BREAST RECONSTRUCTION WITH PLACEMENT OF TISSUE EXPANDER AND FLEX HD (ACELLULAR HYDRATED DERMIS);  Surgeon: Allena Napoleon, MD;  Location:  [facility] OR[facility];  Service: Plastics;  Laterality: Bilateral;   CESAREAN SECTION  1990   COLONOSCOPY WITH PROPOFOL N/A 01/04/2022   Procedure: COLONOSCOPY WITH PROPOFOL;  Surgeon: Wyline Mood, MD;  Location: [facility];  Service: Gastroenterology;  Laterality: N/A;   COLPOSCOPY  07/28/2018   CYSTOSCOPY N/A 11/15/2014   Procedure: CYSTOSCOPY;  Surgeon: Conard Novak, MD;  Location: [facility];  Service: Gynecology;  Laterality: N/A;   DILATION AND CURETTAGE OF UTERUS     GANGLION CYST EXCISION Left    REMOVAL OF BILATERAL TISSUE EXPANDERS WITH PLACEMENT OF BILATERAL BREAST IMPLANTS Bilateral 10/02/2021   Procedure: REMOVAL OF BILATERAL TISSUE EXPANDERS WITH PLACEMENT OF BILATERAL BREAST IMPLANTS;  Surgeon: Allena Napoleon, MD;  Location: [facility];  Service: Plastics;  Laterality: Bilateral;   TOTAL MASTECTOMY Bilateral 05/15/2021   Procedure: TOTAL MASTECTOMY, Bilateral Simple Mastectomy, skin sparing;  Surgeon: Leafy Ro, MD;  Location: [facility];  Service: General;  Laterality: Bilateral;  Provider requesting for 2 hours / 120 min for procedure.   UTERINE FIBROID SURGERY      Family History  Problem Relation Age of Onset   Allergic rhinitis Mother    Congestive Heart Failure Mother    Diabetes Mother    Hypertension Mother    Diabetes Maternal Grandmother    Congestive Heart Failure Maternal Grandfather    Breast cancer Neg Hx     Social History:  reports that she has never smoked. She has never been exposed to tobacco smoke. She has never used smokeless tobacco.  She reports that she does not currently use alcohol. She reports that she does not use drugs.  Allergies:  Allergies  Allergen Reactions   Macadamia Nut Oil Shortness Of Breath   Apple Juice Nausea And Vomiting   [place] Nut (Berthollefia Excelsa) Nausea And Vomiting and Swelling    Tongue swelling   Carrot Oil Nausea And Vomiting    Can not cooked raw carrots   Fruit & Vegetable Daily [Nutritional  Supplements] Nausea And Vomiting    Cannot tolerate apples, peaches, plums, and nectarines   Kiwi Extract Nausea And Vomiting   Other     Lobster   Peach [Prunus Persica] Nausea And Vomiting    Medications reviewed.    ROS Full ROS performed and is otherwise negative other than what is stated in HPI   BP (!) 137/90 (BP Location: Right Arm, Patient Position: Sitting, Cuff Size: Large)   Pulse 86   Temp 98.6 F (37 C) (Oral)   Ht 5' (1.524 m)   Wt 175 lb 6.4 oz (79.6 kg)   LMP 11/15/2014   SpO2 99%   BMI 34.26 kg/m   Physical Exam Vitals and nursing note reviewed. Exam conducted with a chaperone present.  Constitutional:      General: She is not in acute distress.    Appearance: Normal appearance. She is not ill-appearing.  Cardiovascular:     Rate and Rhythm: Normal rate and regular rhythm.     Heart sounds: No murmur heard. Pulmonary:     Effort: Pulmonary effort is normal.     Breath sounds: Normal breath sounds.     Comments: Lateral chest wall with reconstruction no evidence of open wounds no evidence of infection no evidence of any axillary masses.  No evidence of lymphedema on either arms Abdominal:     General: There is no distension.     Palpations: There is no mass.  Musculoskeletal:     Cervical back: Normal range of motion and neck supple. No rigidity or tenderness.  Skin:    General: Skin is warm and dry.     Capillary Refill: Capillary refill takes less than 2 seconds.  Neurological:     General: No focal deficit present.     Mental Status: She is alert and oriented to person, place, and time.  Psychiatric:        Mood and Affect: Mood normal.        Behavior: Behavior normal.        Thought Content: Thought content normal.        Judgment: Judgment normal.     Assessment/Plan: Doing very well after bilateral mastectomy with reconstruction.  No evidence of complications.  She does have some bilaterally numbing and tingling on thumbs.  I do not  think this is related to her surgery.  She does have some potential carpal tunnel symptoms.  We will refer to ortho hand surgery I spent 30 minutes in this encounter including person reviewing medical record, counseling the patient, provided supportive therapy, placing orders and performing documentation.   Sterling Big, MD [facility] General Surgeon

## 2023-03-26 NOTE — Patient Instructions (Signed)
If you have any concerns or questions, please feel free to call our office. Follow up in 1 year.   Breast Self-Awareness Breast self-awareness is knowing how your breasts look and feel. You need to: Check your breasts on a regular basis. Tell your doctor about any changes. Become familiar with the look and feel of your breasts. This can help you catch a breast problem while it is still small and can be treated. You should do breast self-exams even if you have breast implants. What you need: A mirror. A well-lit room. A pillow or other soft object. How to do a breast self-exam Follow these steps to do a breast self-exam: Look for changes  Take off all the clothes above your waist. Stand in front of a mirror in a room with good lighting. Put your hands down at your sides. Compare your breasts in the mirror. Look for any difference between them, such as: A difference in shape. A difference in size. Wrinkles, dips, and bumps in one breast and not the other. Look at each breast for changes in the skin, such as: Redness. Scaly areas. Skin that has gotten thicker. Dimpling. Open sores (ulcers). Look for changes in your nipples, such as: Fluid coming out of a nipple. Fluid around a nipple. Bleeding. Dimpling. Redness. A nipple that looks pushed in (retracted), or that has changed position. Feel for changes Lie on your back. Feel each breast. To do this: Pick a breast to feel. Place a pillow under the shoulder closest to that breast. Put the arm closest to that breast behind your head. Feel the nipple area of that breast using the hand of your other arm. Feel the area with the pads of your three middle fingers by making small circles with your fingers. Use light, medium, and firm pressure. Continue the overlapping circles, moving downward over the breast. Keep making circles with your fingers. Stop when you feel your ribs. Start making circles with your fingers again, this time going  upward until you reach your collarbone. Then, make circles outward across your breast and into your armpit area. Squeeze your nipple. Check for discharge and lumps. Repeat these steps to check your other breast. Sit or stand in the tub or shower. With soapy water on your skin, feel each breast the same way you did when you were lying down. Write down what you find Writing down what you find can help you remember what to tell your doctor. Write down: What is normal for each breast. Any changes you find in each breast. These include: The kind of changes you find. A tender or painful breast. Any lump you find. Write down its size and where it is. When you last had your monthly period (menstrual cycle). General tips If you are breastfeeding, the best time to check your breasts is after you feed your baby or after you use a breast pump. If you get monthly bleeding, the best time to check your breasts is 5-7 days after your monthly cycle ends. With time, you will become comfortable with the self-exam. You will also start to know if there are changes in your breasts. Contact a doctor if: You see a change in the shape or size of your breasts or nipples. You see a change in the skin of your breast or nipples, such as red or scaly skin. You have fluid coming from your nipples that is not normal. You find a new lump or thick area. You have breast pain. You  have any concerns about your breast health. Summary Breast self-awareness includes looking for changes in your breasts and feeling for changes within your breasts. You should do breast self-awareness in front of a mirror in a well-lit room. If you get monthly periods (menstrual cycles), the best time to check your breasts is 5-7 days after your period ends. Tell your doctor about any changes you see in your breasts. Changes include changes in size, changes on the skin, painful or tender breasts, or fluid from your nipples that is not  normal. This information is not intended to replace advice given to you by your health care provider. Make sure you discuss any questions you have with your health care provider. Document Revised: 09/27/2021 Document Reviewed: 02/22/2021 Elsevier Patient Education  2024 [employer].

## 2023-03-27 DIAGNOSIS — J3089 Other allergic rhinitis: Secondary | ICD-10-CM | POA: Diagnosis not present

## 2023-03-27 DIAGNOSIS — J301 Allergic rhinitis due to pollen: Secondary | ICD-10-CM | POA: Diagnosis not present

## 2023-03-27 DIAGNOSIS — J3081 Allergic rhinitis due to animal (cat) (dog) hair and dander: Secondary | ICD-10-CM | POA: Diagnosis not present

## 2023-03-28 ENCOUNTER — Telehealth: Payer: Self-pay

## 2023-03-28 NOTE — Telephone Encounter (Signed)
Faxed referral to Dr. Deeann Saint at [phone].

## 2023-03-31 ENCOUNTER — Telehealth: Payer: Self-pay

## 2023-03-31 LAB — ALPHA-GAL PANEL
Allergen Lamb IgE: <0.1 kU/L
Beef IgE: <0.1 kU/L
IgE (Immunoglobulin E), Serum: 290 [IU]/mL (ref 6–495)
O215-IgE Alpha-Gal: <0.1 kU/L
Pork IgE: <0.1 kU/L

## 2023-03-31 NOTE — Telephone Encounter (Addendum)
Called and discussed lab results with patient. She verbalized understanding and will continue current recommended treatment. She reports she has improved some. Patient advised to call or message [place] if current treatment doesn't help.    ----- Message from Tracey Huff sent at 03/31/2023  4:55 PM EST ----- Alpha-gal testing negative This is not cause of lip swelling. Continue current treatment. We had discussed last visit that hives and lip swelling could be related to stress

## 2023-04-01 DIAGNOSIS — J3081 Allergic rhinitis due to animal (cat) (dog) hair and dander: Secondary | ICD-10-CM | POA: Diagnosis not present

## 2023-04-01 DIAGNOSIS — J3089 Other allergic rhinitis: Secondary | ICD-10-CM | POA: Diagnosis not present

## 2023-04-01 DIAGNOSIS — J301 Allergic rhinitis due to pollen: Secondary | ICD-10-CM | POA: Diagnosis not present

## 2023-04-02 DIAGNOSIS — F4320 Adjustment disorder, unspecified: Secondary | ICD-10-CM | POA: Diagnosis not present

## 2023-04-08 DIAGNOSIS — J3081 Allergic rhinitis due to animal (cat) (dog) hair and dander: Secondary | ICD-10-CM | POA: Diagnosis not present

## 2023-04-08 DIAGNOSIS — J301 Allergic rhinitis due to pollen: Secondary | ICD-10-CM | POA: Diagnosis not present

## 2023-04-08 DIAGNOSIS — J3089 Other allergic rhinitis: Secondary | ICD-10-CM | POA: Diagnosis not present

## 2023-04-10 DIAGNOSIS — J3089 Other allergic rhinitis: Secondary | ICD-10-CM | POA: Diagnosis not present

## 2023-04-10 DIAGNOSIS — J301 Allergic rhinitis due to pollen: Secondary | ICD-10-CM | POA: Diagnosis not present

## 2023-04-10 DIAGNOSIS — J3081 Allergic rhinitis due to animal (cat) (dog) hair and dander: Secondary | ICD-10-CM | POA: Diagnosis not present

## 2023-04-11 DIAGNOSIS — F4320 Adjustment disorder, unspecified: Secondary | ICD-10-CM | POA: Diagnosis not present

## 2023-04-14 ENCOUNTER — Ambulatory Visit: Payer: Commercial Managed Care - PPO | Admitting: Psychology

## 2023-04-14 DIAGNOSIS — F4322 Adjustment disorder with anxiety: Secondary | ICD-10-CM

## 2023-04-14 DIAGNOSIS — F4323 Adjustment disorder with mixed anxiety and depressed mood: Secondary | ICD-10-CM

## 2023-04-14 DIAGNOSIS — F4321 Adjustment disorder with depressed mood: Secondary | ICD-10-CM

## 2023-04-14 NOTE — Progress Notes (Signed)
[facility] Counselor/Therapist Progress Note  Patient ID: Tracey Huff, MRN: [id number],    Date: 04/14/2023  Time Spent: 2:29pm-3:19pm     Treatment Type: Individual Therapy   pt is seen for virtual audio visit via phone.  Pt's phone wouldn't work w/ caregility and therefore unable to do the video visit.  Pt consents to virtual visit and is aware of limitations of such visits. 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   Reported Symptoms:  pt reported some sadness over holiday.  Pt reports positive w family as well.  Mental Status Exam: Appearance:  N/a      Behavior: Appropriate  Motor: n/a  Speech/Language:  Clear and Coherent  Affect: Appropriate  Mood: sad  Thought process: normal  Thought content:   WNL  Sensory/Perceptual disturbances:   WNL  Orientation: oriented to person, place, time/date, and situation  Attention: Good  Concentration: Good  Memory: WNL  Fund of knowledge:  Good  Insight:   Good  Judgment:  Good  Impulse Control: Good   Risk Assessment: Danger to Self:  No Self-injurious Behavior: No Danger to Others: No Duty to Warn:no Physical Aggression / Violence:No  Access to Firearms a concern: No  Gang Involvement:No   Subjective: Counselor assessed pt current functioning per pt report.  Processed w/pt holidays and grieving.   Validated and normalized range of emotions w/ tearfulness to times of enjoying self.  Discussed was of keeping connected w/ supports and positive interactions.  Pt affect wnl.  Pt reports her day has been good.  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  Pt reports she went to her uncle's as planned for Thanksgiving and really enjoyed.  Pt reports she ran into her cousin shopping the weekend and spent the rest of the day together.  Pt also reports tearful and grieving the day before Thanksgiving remember times w/ husband.  Pt allowing self to feel variety of emotions and  accepting.  Pt reports on her plans for Christmas.  Interventions: Cognitive Behavioral Therapy and supportive  Diagnosis:Adjustment disorder with anxiety  Grief  Plan: Pt f/u in 2 weeks for counseling.  Pt to f/u a scheduled w/ PCP.    Individualized Treatment Plan Strengths: seeking counseling   Supports: daughters, friends    Goal/Needs for Treatment:  In order of importance to patient 1) increase appropriate expression of feelings.   2) cope w/ stressors 3) ---    Client Statement of Needs: "I do things to cover up and not feel.  Creating safe space to talk.  Trying to find myself- for last 4 years I had to take care of him and put my self off. "    Treatment Level: outpt counseling  Symptoms: feeling anxious/on edge, worry, irritability  Client Treatment Preferences:biweekly counseling. Continue med management w/ PCP    Healthcare consumer's goal for treatment:   Counselor, Forde Radon, [facility] will support the patient's ability to achieve the goals identified. Cognitive Behavioral Therapy, Assertive Communication/Conflict Resolution Training, Relaxation Training, ACT, Humanistic and other evidenced-based practices will be used to promote progress towards healthy functioning.    Healthcare consumer will: Actively participate in therapy, working towards healthy functioning.     *Justification for Continuation/Discontinuation of Goal: R=Revised, O=Ongoing, A=Achieved, D=Discontinued   Goal 1) Increase pt verbal expression of feelings and emotions. Baseline date 09/10/22: Progress towards goal 0; How Often - Daily Target Date Goal Was reviewed Status Code Progress towards goal/Likert  rating  09/10/23                            Goal 2) Increase self care and coping skills daily to manage stress to reduce anxiety and irritability AEB Pt report and therapist observation. Baseline date 09/10/22: Progress towards goal 0; How Often - Daily Target Date Goal Was reviewed Status Code  Progress towards goal  09/10/23                            This plan has been reviewed and created by the following participants:  This plan will be reviewed at least every 12 months. Date Behavioral Health Clinician Date Guardian/Patient   09/10/22          [facility] [name] [facility] 09/10/22 Verbal Consent Provided                             Forde Radon [facility]

## 2023-04-15 ENCOUNTER — Other Ambulatory Visit (HOSPITAL_COMMUNITY): Payer: Self-pay

## 2023-04-15 DIAGNOSIS — J301 Allergic rhinitis due to pollen: Secondary | ICD-10-CM | POA: Diagnosis not present

## 2023-04-15 DIAGNOSIS — J3089 Other allergic rhinitis: Secondary | ICD-10-CM | POA: Diagnosis not present

## 2023-04-15 DIAGNOSIS — J3081 Allergic rhinitis due to animal (cat) (dog) hair and dander: Secondary | ICD-10-CM | POA: Diagnosis not present

## 2023-04-15 MED ORDER — EPINEPHRINE 0.3 MG/0.3ML IJ SOAJ
0.3000 mg | INTRAMUSCULAR | 1 refills | Status: AC | PRN
Start: 2023-04-15 — End: ?
  Filled 2023-04-15: qty 2, 2d supply, fill #0

## 2023-04-16 DIAGNOSIS — F4320 Adjustment disorder, unspecified: Secondary | ICD-10-CM | POA: Diagnosis not present

## 2023-04-17 ENCOUNTER — Other Ambulatory Visit (HOSPITAL_COMMUNITY): Payer: Self-pay

## 2023-04-18 DIAGNOSIS — F4320 Adjustment disorder, unspecified: Secondary | ICD-10-CM | POA: Diagnosis not present

## 2023-04-18 DIAGNOSIS — J3089 Other allergic rhinitis: Secondary | ICD-10-CM | POA: Diagnosis not present

## 2023-04-18 DIAGNOSIS — J301 Allergic rhinitis due to pollen: Secondary | ICD-10-CM | POA: Diagnosis not present

## 2023-04-18 DIAGNOSIS — J3081 Allergic rhinitis due to animal (cat) (dog) hair and dander: Secondary | ICD-10-CM | POA: Diagnosis not present

## 2023-04-21 ENCOUNTER — Ambulatory Visit: Payer: Commercial Managed Care - PPO | Admitting: Psychology

## 2023-04-21 DIAGNOSIS — F4321 Adjustment disorder with depressed mood: Secondary | ICD-10-CM | POA: Diagnosis not present

## 2023-04-21 DIAGNOSIS — F4322 Adjustment disorder with anxiety: Secondary | ICD-10-CM

## 2023-04-21 NOTE — Progress Notes (Signed)
[facility] Counselor/Therapist Progress Note  Patient ID: Tracey Huff, MRN: [id number],    Date: 04/21/2023  Time Spent: 2:40pm-3:23pm     Treatment Type: Individual Therapy   pt is seen for virtual audio visit via phone.  Pt's phone wouldn't work w/ caregility and therefore unable to do the video visit.  Pt consents to virtual visit and is aware of limitations of such visits. 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   Reported Symptoms:  pt reported positive interactions over weekend, enjoying time w/ friends.  Pt reported followed up w/ gym visit.  Mental Status Exam: Appearance:  N/a      Behavior: Appropriate  Motor: n/a  Speech/Language:  Clear and Coherent  Affect: Appropriate  Mood: sad  Thought process: normal  Thought content:   WNL  Sensory/Perceptual disturbances:   WNL  Orientation: oriented to person, place, time/date, and situation  Attention: Good  Concentration: Good  Memory: WNL  Fund of knowledge:  Good  Insight:   Good  Judgment:  Good  Impulse Control: Good   Risk Assessment: Danger to Self:  No Self-injurious Behavior: No Danger to Others: No Duty to Warn:no Physical Aggression / Violence:No  Access to Firearms a concern: No  Gang Involvement:No   Subjective: Counselor assessed pt current functioning per pt report.  Processed w/pt positives, stressors, grief.   Validated and normalized range of emotions pt has experienced.  Discussed positive engagement w/ friends over the weekend.  Explored pt taking time for self and activities that important to her and help her reconnect to self.   Pt affect wnl.  Pt reports today as good.  Pt reports 3 month anniversary of husband's death over weekend.  Pt reported some feeling of grief and easily annoyed by some things that day.  Pt reports went to friend's birthday and really enjoyed herself.  Pt reported on neighbor flirty behavior and recognizing not  ready for relationship or what wants in future.  Pt discussed how she went to gym and got tour and will start a trial this week and plans to go w/friends.  Pt discussed other things she wants to explored w/ her past interests.    Interventions: Cognitive Behavioral Therapy and supportive  Diagnosis:Adjustment disorder with anxiety  Grief  Plan: Pt f/u in 2 weeks for counseling.  Pt to f/u a scheduled w/ PCP.    Individualized Treatment Plan Strengths: seeking counseling   Supports: daughters, friends    Goal/Needs for Treatment:  In order of importance to patient 1) increase appropriate expression of feelings.   2) cope w/ stressors 3) ---    Client Statement of Needs: "I do things to cover up and not feel.  Creating safe space to talk.  Trying to find myself- for last 4 years I had to take care of him and put my self off. "    Treatment Level: outpt counseling  Symptoms: feeling anxious/on edge, worry, irritability  Client Treatment Preferences:biweekly counseling. Continue med management w/ PCP    Healthcare consumer's goal for treatment:   Counselor, Forde Radon, [facility] will support the patient's ability to achieve the goals identified. Cognitive Behavioral Therapy, Assertive Communication/Conflict Resolution Training, Relaxation Training, ACT, Humanistic and other evidenced-based practices will be used to promote progress towards healthy functioning.    Healthcare consumer will: Actively participate in therapy, working towards healthy functioning.     *Justification for Continuation/Discontinuation of Goal: R=Revised,  O=Ongoing, A=Achieved, D=Discontinued   Goal 1) Increase pt verbal expression of feelings and emotions. Baseline date 09/10/22: Progress towards goal 0; How Often - Daily Target Date Goal Was reviewed Status Code Progress towards goal/Likert rating  09/10/23                            Goal 2) Increase self care and coping skills daily to manage stress to reduce  anxiety and irritability AEB Pt report and therapist observation. Baseline date 09/10/22: Progress towards goal 0; How Often - Daily Target Date Goal Was reviewed Status Code Progress towards goal  09/10/23                            This plan has been reviewed and created by the following participants:  This plan will be reviewed at least every 12 months. Date Behavioral Health Clinician Date Guardian/Patient   09/10/22          [facility] [name] [facility] 09/10/22 Verbal Consent Provided                     Forde Radon [facility]

## 2023-04-22 DIAGNOSIS — J3081 Allergic rhinitis due to animal (cat) (dog) hair and dander: Secondary | ICD-10-CM | POA: Diagnosis not present

## 2023-04-22 DIAGNOSIS — J3089 Other allergic rhinitis: Secondary | ICD-10-CM | POA: Diagnosis not present

## 2023-04-22 DIAGNOSIS — J301 Allergic rhinitis due to pollen: Secondary | ICD-10-CM | POA: Diagnosis not present

## 2023-04-23 DIAGNOSIS — F4320 Adjustment disorder, unspecified: Secondary | ICD-10-CM | POA: Diagnosis not present

## 2023-04-25 DIAGNOSIS — J3089 Other allergic rhinitis: Secondary | ICD-10-CM | POA: Diagnosis not present

## 2023-04-25 DIAGNOSIS — J301 Allergic rhinitis due to pollen: Secondary | ICD-10-CM | POA: Diagnosis not present

## 2023-04-25 DIAGNOSIS — J3081 Allergic rhinitis due to animal (cat) (dog) hair and dander: Secondary | ICD-10-CM | POA: Diagnosis not present

## 2023-04-28 IMAGING — MR MR BREAST BILAT WO/W CM
4 of 12 series · 15 of 48 positions shown · IV contrast (8ml Gadavist)
Comparison: Previous exam(s).

CLINICAL DATA: 50-year-old female with newly diagnosed LEFT breast
cancer.

EXAM:
BILATERAL BREAST MRI WITH AND WITHOUT CONTRAST
TECHNIQUE: Multiplanar, multisequence MR images of both breasts were obtained
prior to and following the intravenous administration of 8 ml of
Gadavist

[Series 2: T1 · axial · B · 1.5mm · 1.05mm/px · z∈[-71,+95]mm · 5 of 112 slices shown (1 of 2)]
[im 1/112]
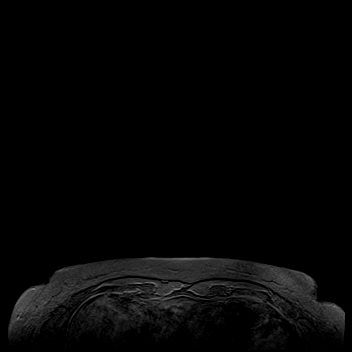
[im 28/112]
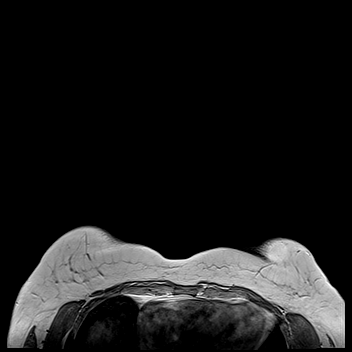
[im 56/112]
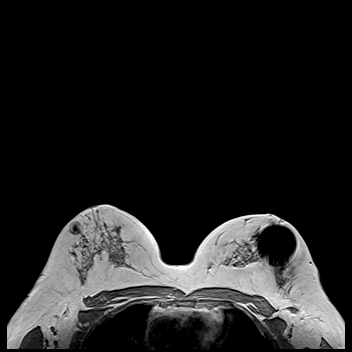
[im 84/112]
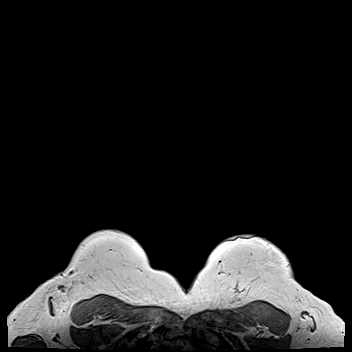
[im 112/112]
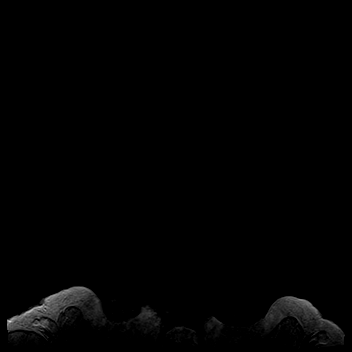

[Series 3: T2 · axial · B · 3.0mm · 1.05mm/px · z∈[-69,+93]mm · 3 of 45 slices shown (1 of 2)]
[im 1/45]
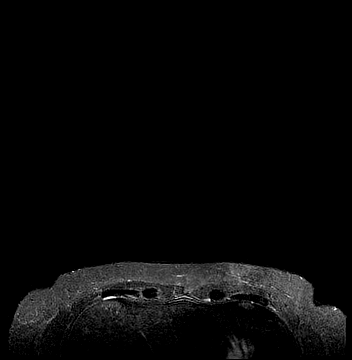
[im 23/45]
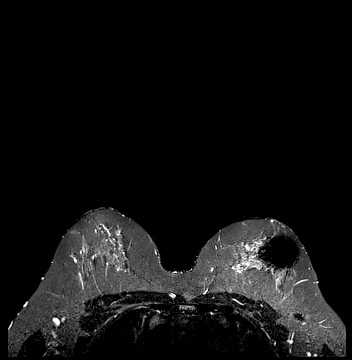
[im 45/45]
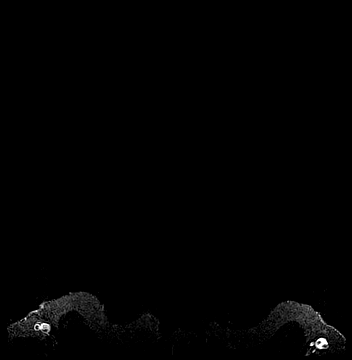

[Series 5: T1 · axial · B · 1.5mm · 1.05mm/px · z∈[-71,+95]mm · 5 of 112 slices shown (2 of 2)]
[im 1/112]
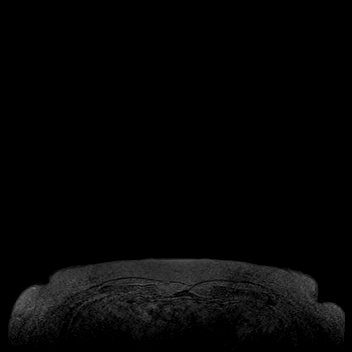
[im 28/112]
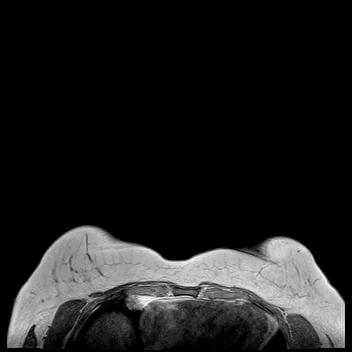
[im 56/112]
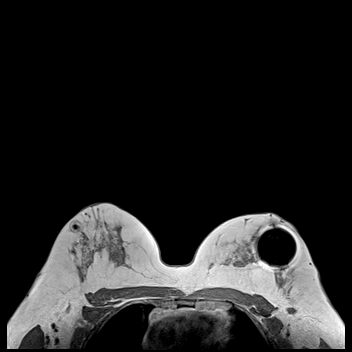
[im 84/112]
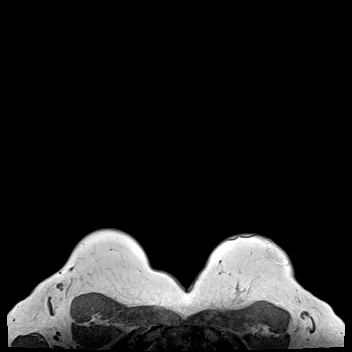
[im 112/112]
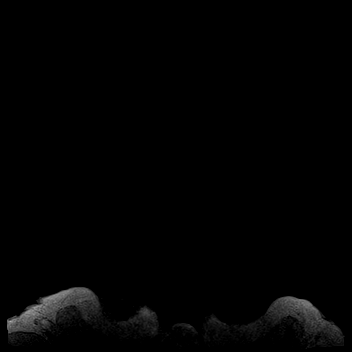

[Series 6: T2 · axial · B · 3.0mm · 1.05mm/px · z∈[-69,+93]mm · 2 of 46 slices shown (2 of 2)]
[im 1/46]
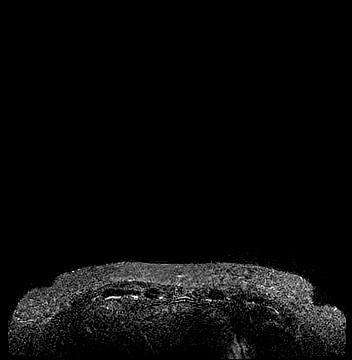
[im 46/46]
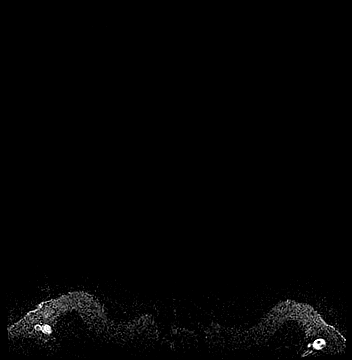

[15 of 48 positions shown; findings below may reference images not displayed]

Three-dimensional MR images were rendered by post-processing of the
original MR data on an independent workstation. The
three-dimensional MR images were interpreted, and findings are
reported in the following complete MRI report for this study. Three
dimensional images were evaluated at the independent interpreting
workstation using the DynaCAD thin client.
FINDINGS: Breast composition: c. Heterogeneous fibroglandular tissue.

Background parenchymal enhancement: Moderate.

Right breast: No suspicious mass or worrisome enhancement. Biopsy
clip artifact within the OUTER RIGHT breast identified.

Left breast: RF tag artifact creates a moderate to large amount of
artifact limiting evaluation of a large portion of the central/OUTER
LEFT breast. The biopsy-proven malignancy is not visualized due to
artifact.

No suspicious mass or worrisome enhancement is identified within the
visualized portions of the LEFT breast.

Lymph nodes: No abnormal appearing lymph nodes.

Ancillary findings:  None.
IMPRESSION: 1. Moderate to large amount of artifact from RF tag obscuring the
biopsy-proven UPPER LEFT breast malignancy and portions of the
central and OUTER LEFT breast.
2. No suspicious findings noted within the visualized portions of
the LEFT breast or within the RIGHT breast.
3. No abnormal appearing axillary lymph nodes.

RECOMMENDATION:
Treatment plan

BI-RADS CATEGORY  6: Known biopsy-proven malignancy.

## 2023-04-30 IMAGING — MG MM BREAST SURGICAL SPECIMEN
1 series · 1 of 1 positions shown · non-contrast
Comparison: Previous exam(s).

CLINICAL DATA: Patient is post radiofrequency tag localization
subsequent surgical excision known left breast malignancy.

EXAM:
SPECIMEN RADIOGRAPH OF THE LEFT BREAST

[L]
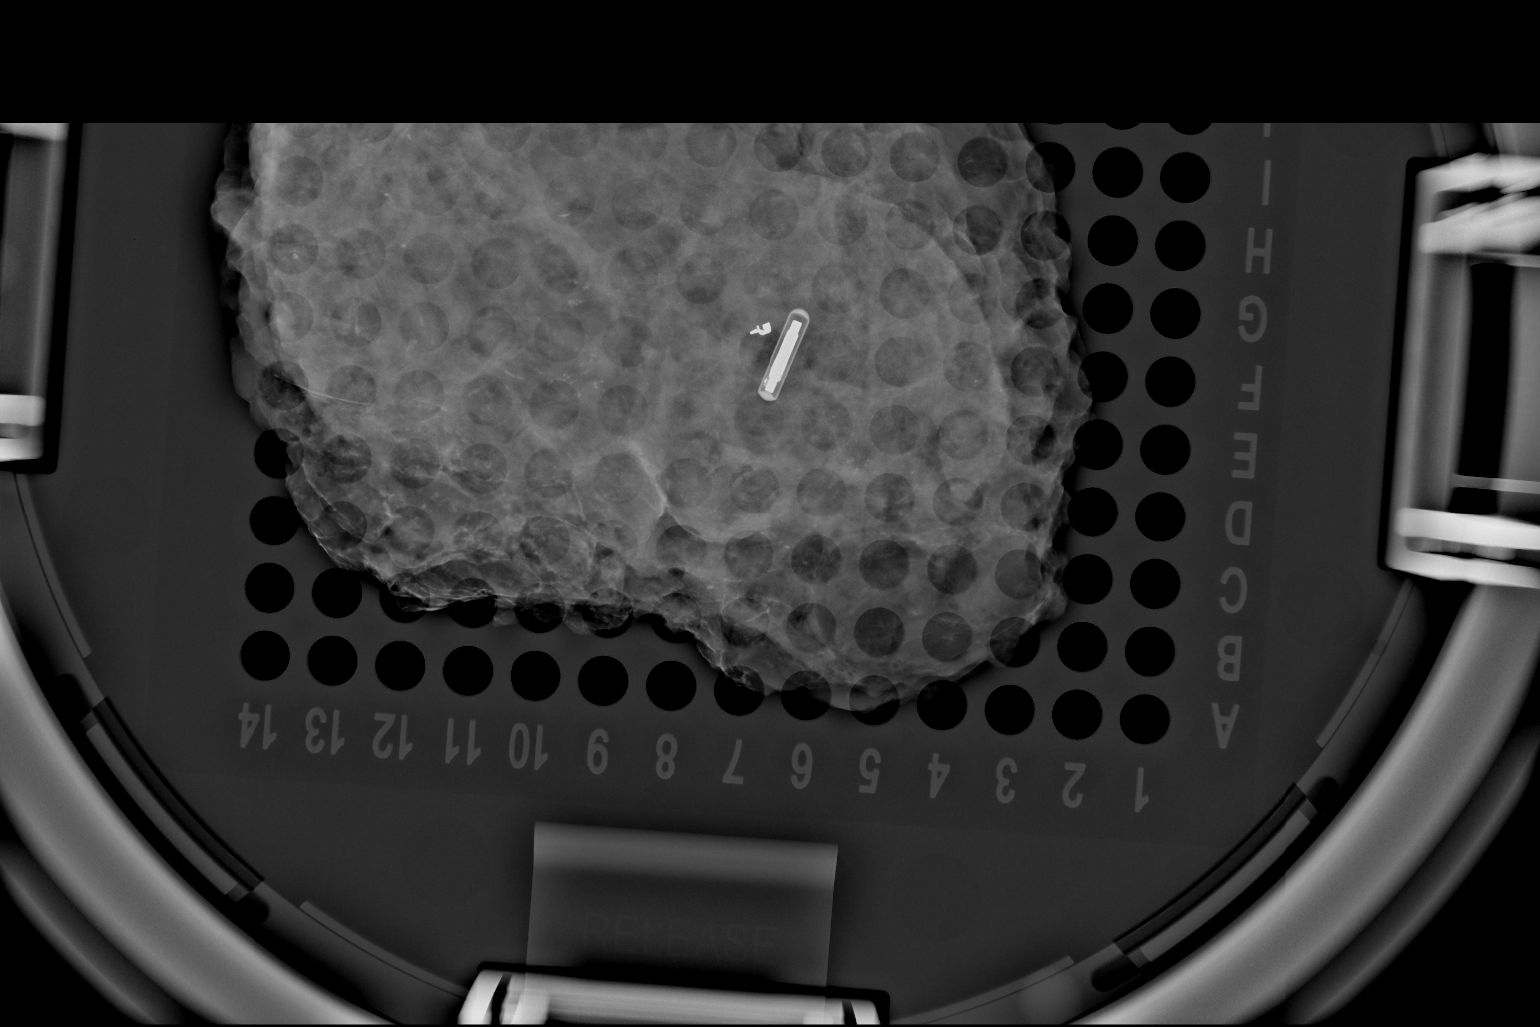

[1 of 1 positions shown; findings below may reference images not displayed]

FINDINGS: Status post excision of the left breast. The radiofrequency tag and
biopsy marker clip are present and are marked for pathology. Results
were called to Dr. Escandon in the OR at the time of dictation.
IMPRESSION: Specimen radiograph of the left breast.

## 2023-05-02 ENCOUNTER — Other Ambulatory Visit (HOSPITAL_COMMUNITY): Payer: Self-pay

## 2023-05-02 DIAGNOSIS — J301 Allergic rhinitis due to pollen: Secondary | ICD-10-CM | POA: Diagnosis not present

## 2023-05-02 DIAGNOSIS — J3081 Allergic rhinitis due to animal (cat) (dog) hair and dander: Secondary | ICD-10-CM | POA: Diagnosis not present

## 2023-05-02 DIAGNOSIS — J3089 Other allergic rhinitis: Secondary | ICD-10-CM | POA: Diagnosis not present

## 2023-05-06 DIAGNOSIS — F4320 Adjustment disorder, unspecified: Secondary | ICD-10-CM | POA: Diagnosis not present

## 2023-05-08 ENCOUNTER — Other Ambulatory Visit: Payer: Self-pay

## 2023-05-08 ENCOUNTER — Other Ambulatory Visit (HOSPITAL_COMMUNITY): Payer: Self-pay

## 2023-05-08 DIAGNOSIS — G5603 Carpal tunnel syndrome, bilateral upper limbs: Secondary | ICD-10-CM | POA: Diagnosis not present

## 2023-05-08 MED ORDER — MELOXICAM 15 MG PO TABS
15.0000 mg | ORAL_TABLET | Freq: Every day | ORAL | 2 refills | Status: DC
  Filled 2023-05-08: qty 30, 30d supply, fill #0
  Filled 2023-06-25: qty 30, 30d supply, fill #1
  Filled 2023-08-07: qty 30, 30d supply, fill #2

## 2023-05-09 DIAGNOSIS — J3089 Other allergic rhinitis: Secondary | ICD-10-CM | POA: Diagnosis not present

## 2023-05-09 DIAGNOSIS — F4320 Adjustment disorder, unspecified: Secondary | ICD-10-CM | POA: Diagnosis not present

## 2023-05-09 DIAGNOSIS — J301 Allergic rhinitis due to pollen: Secondary | ICD-10-CM | POA: Diagnosis not present

## 2023-05-09 DIAGNOSIS — J3081 Allergic rhinitis due to animal (cat) (dog) hair and dander: Secondary | ICD-10-CM | POA: Diagnosis not present

## 2023-05-12 ENCOUNTER — Ambulatory Visit: Payer: Commercial Managed Care - PPO | Admitting: Psychology

## 2023-05-12 DIAGNOSIS — F4322 Adjustment disorder with anxiety: Secondary | ICD-10-CM | POA: Diagnosis not present

## 2023-05-12 DIAGNOSIS — F4321 Adjustment disorder with depressed mood: Secondary | ICD-10-CM

## 2023-05-12 NOTE — Progress Notes (Signed)
 [facility] Counselor/Therapist Progress Note  Patient ID: Tracey Huff, MRN: [id number],    Date: 05/12/2023  Time Spent: 2:03pm-2:35pm     Treatment Type: Individual Therapy   pt is seen for virtual audio visit via phone.  Pt's phone wouldn't work w/ caregility and therefore unable to do the video visit.  Pt consents to virtual visit and is aware of limitations of such visits. 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   Reported Symptoms:  pt reported positive w/ engaging w/ friends and family and going to gym.  Pt reports some boundaries she had to set w/ others.  Pt reports one day feeling very overwhelmed and triggered.  Mental Status Exam: Appearance:  N/a      Behavior: Appropriate  Motor: n/a  Speech/Language:  Clear and Coherent  Affect: Appropriate  Mood: sad  Thought process: normal  Thought content:   WNL  Sensory/Perceptual disturbances:   WNL  Orientation: oriented to person, place, time/date, and situation  Attention: Good  Concentration: Good  Memory: WNL  Fund of knowledge:  Good  Insight:   Good  Judgment:  Good  Impulse Control: Good   Risk Assessment: Danger to Self:  No Self-injurious Behavior: No Danger to Others: No Duty to Warn:no Physical Aggression / Violence:No  Access to Firearms a concern: No  Gang Involvement:No   Subjective: Counselor assessed pt current functioning per pt report.  Processed w/pt positives, stressors and emotions.  Discussed engagement w/ friends and family and activities she is engaging in.  Processed difficult day and validating emotions and ways of coping. Explored ways to engage but not overdo and maintain feeling grounded.     Pt affect wnl.  Pt reports she had a good weekend and has been busy w/ work, going to gym regularly and discussed time w/ family and friends.  Pt express difficulty w/ one day that felt overwhelmed when came home and triggered emotionally.  Pt  reports that had to leave house and take some time for emotional deregulation.  Pt discussed awareness of things being to busy and then tired and overwhelmed.  Pt discussed upcoming plans for self and self care.   Interventions: Cognitive Behavioral Therapy and supportive  Diagnosis:Adjustment disorder with anxiety  Grief  Plan: Pt f/u in 2 weeks for counseling.  Pt to f/u a scheduled w/ PCP.    Individualized Treatment Plan Strengths: seeking counseling   Supports: daughters, friends    Goal/Needs for Treatment:  In order of importance to patient 1) increase appropriate expression of feelings.   2) cope w/ stressors 3) ---    Client Statement of Needs: I do things to cover up and not feel.  Creating safe space to talk.  Trying to find myself- for last 4 years I had to take care of him and put my self off.     Treatment Level: outpt counseling  Symptoms: feeling anxious/on edge, worry, irritability  Client Treatment Preferences:biweekly counseling. Continue med management w/ PCP    Healthcare consumer's goal for treatment:   Counselor, Damien Herald, [facility] will support the patient's ability to achieve the goals identified. Cognitive Behavioral Therapy, Assertive Communication/Conflict Resolution Training, Relaxation Training, ACT, Humanistic and other evidenced-based practices will be used to promote progress towards healthy functioning.    Healthcare consumer will: Actively participate in therapy, working towards healthy functioning.     *Justification for Continuation/Discontinuation of Goal: R=Revised, O=Ongoing, A=Achieved, D=Discontinued   Goal  1) Increase pt verbal expression of feelings and emotions. Baseline date 09/10/22: Progress towards goal 0; How Often - Daily Target Date Goal Was reviewed Status Code Progress towards goal/Likert rating  09/10/23                            Goal 2) Increase self care and coping skills daily to manage stress to reduce anxiety and  irritability AEB Pt report and therapist observation. Baseline date 09/10/22: Progress towards goal 0; How Often - Daily Target Date Goal Was reviewed Status Code Progress towards goal  09/10/23                            This plan has been reviewed and created by the following participants:  This plan will be reviewed at least every 12 months. Date Behavioral Health Clinician Date Guardian/Patient   09/10/22          [facility] Tracey [facility] 09/10/22 Verbal Consent Provided                            Tracey Huff [facility]

## 2023-05-14 DIAGNOSIS — F4320 Adjustment disorder, unspecified: Secondary | ICD-10-CM | POA: Diagnosis not present

## 2023-05-15 DIAGNOSIS — J301 Allergic rhinitis due to pollen: Secondary | ICD-10-CM | POA: Diagnosis not present

## 2023-05-15 DIAGNOSIS — J3081 Allergic rhinitis due to animal (cat) (dog) hair and dander: Secondary | ICD-10-CM | POA: Diagnosis not present

## 2023-05-15 DIAGNOSIS — J3089 Other allergic rhinitis: Secondary | ICD-10-CM | POA: Diagnosis not present

## 2023-05-23 DIAGNOSIS — J301 Allergic rhinitis due to pollen: Secondary | ICD-10-CM | POA: Diagnosis not present

## 2023-05-23 DIAGNOSIS — J3089 Other allergic rhinitis: Secondary | ICD-10-CM | POA: Diagnosis not present

## 2023-05-23 DIAGNOSIS — J3081 Allergic rhinitis due to animal (cat) (dog) hair and dander: Secondary | ICD-10-CM | POA: Diagnosis not present

## 2023-05-28 ENCOUNTER — Ambulatory Visit (INDEPENDENT_AMBULATORY_CARE_PROVIDER_SITE_OTHER): Payer: Commercial Managed Care - PPO | Admitting: Psychology

## 2023-05-28 DIAGNOSIS — F4321 Adjustment disorder with depressed mood: Secondary | ICD-10-CM | POA: Diagnosis not present

## 2023-05-28 DIAGNOSIS — F4322 Adjustment disorder with anxiety: Secondary | ICD-10-CM

## 2023-05-29 ENCOUNTER — Other Ambulatory Visit: Payer: Self-pay | Admitting: Nurse Practitioner

## 2023-05-29 ENCOUNTER — Other Ambulatory Visit (HOSPITAL_COMMUNITY): Payer: Self-pay

## 2023-05-29 ENCOUNTER — Encounter: Payer: Self-pay | Admitting: Internal Medicine

## 2023-05-29 ENCOUNTER — Inpatient Hospital Stay: Payer: Commercial Managed Care - PPO | Attending: Internal Medicine | Admitting: Internal Medicine

## 2023-05-29 VITALS — BP 314/98 | HR 87 | Temp 98.5°F | Ht 60.0 in | Wt 174.4 lb

## 2023-05-29 DIAGNOSIS — Z79810 Long term (current) use of selective estrogen receptor modulators (SERMs): Secondary | ICD-10-CM | POA: Insufficient documentation

## 2023-05-29 DIAGNOSIS — C50412 Malignant neoplasm of upper-outer quadrant of left female breast: Secondary | ICD-10-CM | POA: Insufficient documentation

## 2023-05-29 DIAGNOSIS — Z1721 Progesterone receptor positive status: Secondary | ICD-10-CM | POA: Insufficient documentation

## 2023-05-29 DIAGNOSIS — Z9013 Acquired absence of bilateral breasts and nipples: Secondary | ICD-10-CM | POA: Insufficient documentation

## 2023-05-29 DIAGNOSIS — G4709 Other insomnia: Secondary | ICD-10-CM

## 2023-05-29 DIAGNOSIS — Z170 Estrogen receptor positive status [ER+]: Secondary | ICD-10-CM | POA: Diagnosis not present

## 2023-05-29 DIAGNOSIS — G5603 Carpal tunnel syndrome, bilateral upper limbs: Secondary | ICD-10-CM | POA: Diagnosis not present

## 2023-05-29 MED ORDER — TAMOXIFEN CITRATE 20 MG PO TABS
20.0000 mg | ORAL_TABLET | Freq: Every day | ORAL | 1 refills | Status: DC
  Filled 2023-05-29: qty 90, 90d supply, fill #0
  Filled 2023-09-01: qty 90, 90d supply, fill #1

## 2023-05-29 MED ORDER — ZOLPIDEM TARTRATE 5 MG PO TABS: 5.0000 mg | ORAL_TABLET | Freq: Every evening | ORAL | 1 refills | Status: AC | PRN

## 2023-05-29 NOTE — Progress Notes (Signed)
[facility] CONSULT NOTE  Patient Care Team: Berniece Salines, FNP as PCP - General (Nurse Practitioner) Benita Gutter, RN as Oncology Nurse Navigator Scarlett Presto, RN (Inactive) as Oncology Nurse Navigator Pabon, Merri Ray, MD as Consulting Physician (General Surgery) Earna Coder, MD as Consulting Physician (Oncology) Allena Napoleon, MD as Consulting Physician (Plastic Surgery)  CHIEF COMPLAINTS/PURPOSE OF CONSULTATION: Breast cancer   Oncology History Overview Note  # On physical exam, I palpate a focal firm 2 x 5 cm masslike area over the 11 to 1:30 position of the left breast approximately 2-4 cm from the nipple.   Targeted ultrasound is performed, showing no discrete focal abnormality over the upper mid to outer left breast. There is a pattern of dense fibroglandular tissue over this area which appears slightly different from the dense fibroglandular tissue elsewhere in the left breast. There is hazy decreased echogenicity with subtle shadowing within this dense tissue at the 12 to 1 o'clock position. Some of this vague hazy decreased echogenicity with shadowing is present at the 12 o'clock position 4 cm from the nipple more focal with harmonics and measures approximately 1.4 x 1.6 x 2 cm. There is another more focal area of decreased echogenicity and shadowing at the 1 o'clock position of the left breast 2 cm from the nipple measuring 1.4 x 1.5 x 1.5 cm. These 2 areas appear to be contiguous and are likely part of the same process.  1.4 x 1.6 x 2 cm. There is another more focal area of decreased echogenicity and shadowing at the 1 o'clock position of the left breast 2 cm from the nipple measuring 1.4 x 1.5 x 1.5 cm. These 2 areas appear to be contiguous and are likely part of the same process.  DIAGNOSIS:  A. 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   Size of invasive carcinoma: 2 mm  in this sample  Histologic grade of invasive carcinoma: Grade 2                       Glandular/tubular differentiation score: 3                       Nuclear pleomorphism score: 2                       Mitotic rate score: 1                       Total score: 6  Ductal carcinoma in situ: Present, intermediate grade  Lymphovascular invasion: Not identified    Ultrasound the left axilla is normal. CASE SUMMARY: BREAST BIOMARKER TESTS  Estrogen Receptor (ER) Status: POSITIVE          Percentage of cells with nuclear positivity: 90-100%          Average intensity of staining: Strong   Progesterone Receptor (PgR) Status: POSITIVE          Percentage of cells with nuclear positivity: 90-100%          Average intensity of staining: Strong   HER2 (by immunohistochemistry): NEGATIVE (Score 1+)  Ki-67: Not performed  Procedure: Simple mastectomy  Specimen Laterality: Left (bilateral mastectomy, tumor in left)   TUMOR  Histologic Type: Invasive mammary carcinoma, no special type  Histologic Grade (Nottingham Histologic Score)  Glandular (Acinar)/Tubular Differentiation: 2                       Nuclear Pleomorphism: 2                       Mitotic Rate: 2                       Overall Grade: 2  Tumor Size: 10 mm  Ductal Carcinoma In Situ (DCIS): Present, with extensive intraductal  component  Lymphovascular Invasion: Not identified  Treatment Effect in the Breast: No known presurgical treatment   MARGINS  Margin Status for Invasive Carcinoma: All margins negative for invasive  carcinoma                       Distance from invasive carcinoma to closest margin:  1.2 cm, posterior   Margin Status for DCIS: Margin(s) Involved by DCIS: All margins negative  for DCIS                       Distance from DCIS to closest margin: 1.2 cm,  posterior   REGIONAL LYMPH NODES  Regional Lymph Node Status: All regional lymph nodes negative for tumor                        Total Number of Lymph Nodes Examined (sentinel and  non-sentinel): 3                       Number of Sentinel Nodes Examined: 3   DISTANT METASTASIS  Distant Site(s) Involved, if applicable: Not applicable   PATHOLOGIC STAGE CLASSIFICATION (pTNM, AJCC 8th Edition):  TNM Descriptors: M (multifocal)  pT Category: pT1b  Regional Lymph Nodes Modifier: sn  pN Category: pN0  pM Category: Not applicable   SPECIAL STUDIES  Breast Biomarker Testing Performed on Previous Biopsy: [id number]   Estrogen Receptor (ER) Status: POSITIVE  Progesterone Receptor (PgR) Status: POSITIVE  HER2 (by immunohistochemistry): NEGATIVE (Score 1+)   #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  #June 19, 2021-tamoxifen 20 mg a day [ estrogen/LH-?  Postmenopausal].   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 VUS in RAD51D called c.434G> and in [facility] called c.476C>T also identified. The final report date is 05/09/2020.  The CancerNext-Expanded + RNAinsight gene panel offered by [employer] and includes sequencing and rearrangement analysis for the following 77 genes: IP, ALK, APC*, ATM*, AXIN2, BAP1, BARD1, BLM, BMPR1A, BRCA1*, BRCA2*, BRIP1*, CDC73, CDH1*,CDK4, CDKN1B, CDKN2A, CHEK2*, CTNNA1, DICER1, FANCC, FH, FLCN, GALNT12, KIF1B, LZTR1, MAX, MEN1, MET, MLH1*, MSH2*, MSH3, MSH6*, MUTYH*, NBN, NF1*, NF2, NTHL1, PALB2*, PHOX2B, PMS2*, POT1, PRKAR1A, PTCH1, PTEN*, RAD51C*, RAD51D*,RB1, RECQL, RET, SDHA, SDHAF2, SDHB, SDHC, SDHD,  SMAD4, SMARCA4, SMARCB1, SMARCE1, STK11, SUFU, TMEM127, TP53*,TSC1, TSC2, VHL and XRCC2 (sequencing and deletion/duplication); EGFR, EGLN1, HOXB13, KIT, MITF, PDGFRA, POLD1 and POLE (sequencing only); EPCAM and GREM1 (deletion/duplication only).    05/29/2021 Cancer Staging   Staging form:  Breast, AJCC 8th Edition - Pathologic: pT1b, pN0, cM0, ER+, PR+, HER2- - Signed by Earna Coder, MD on 05/29/2021 Nuclear grade: G2    HISTORY OF PRESENTING ILLNESS: Alone.  Ambulating independently.  Tracey Huff 53 y.o.  female patient with stage I ER/PR positive HER2 negative left breast cancer  [CHEK2 mutant] s/p bilateral mastectomies on tamoxifen is here for follow-up.   C/O numbness and tingling in 2 fingers on her left hand, asking if this could be from the mastectomy or carpal tunnel  Patient complains of continued hot flashes which are quite intense.  Currently patient has going through significant social stress at home.  Her husband is currently being considered for hospice for his brain tumor.  Denies any breast pain.Denies joint pain. 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   Review of Systems  Constitutional:  Negative for chills, diaphoresis, fever, malaise/fatigue and weight loss.  HENT:  Negative for nosebleeds and sore throat.   Eyes:  Negative for double vision.  Respiratory:  Negative for cough, hemoptysis, sputum production, shortness of breath and wheezing.   Cardiovascular:  Negative for chest pain, palpitations, orthopnea and leg swelling.  Gastrointestinal:  Negative for abdominal pain, blood in stool, constipation, diarrhea, heartburn, melena, nausea and vomiting.  Genitourinary:  Negative for dysuria, frequency and urgency.  Musculoskeletal:  Negative for back pain and joint pain.  Skin: Negative.  Negative for itching and rash.  Neurological:  Negative for dizziness, tingling, focal weakness, weakness and headaches.  Endo/Heme/Allergies:  Does  not bruise/bleed easily.  Psychiatric/Behavioral:  Negative for depression. The patient is not nervous/anxious and does not have insomnia.      MEDICAL HISTORY:  Past Medical History:  Diagnosis Date   Allergy    Anxiety    Asthma    Breast cancer (HCC)    INVASIVE MAMMARY CARCINOMA- left   Family history of prostate cancer    Fibroid uterus 11/15/2014   Hyperlipidemia    Hypertension    Menorrhagia with irregular cycle 11/15/2014   Pre-diabetes    Urticaria     SURGICAL HISTORY: Past Surgical History:  Procedure Laterality Date   ABDOMINAL HYSTERECTOMY N/A 11/15/2014   Procedure: HYSTERECTOMY ABDOMINAL/BILATERAL SALPINGECTOMY ;  Surgeon: Conard Novak, MD;  Location: [facility];  Service: Gynecology;  Laterality: N/A;   BREAST BIOPSY Right    2019 negative   BREAST BIOPSY Left 03/09/2021   u/s biopsy, 12-1 o'clock, VENUS" clip-path pending   BREAST LUMPECTOMY,RADIO FREQ LOCALIZER,AXILLARY SENTINEL LYMPH NODE BIOPSY Left 03/28/2021   Procedure: BREAST LUMPECTOMY,RADIO FREQ LOCALIZER,AXILLARY SENTINEL LYMPH NODE BIOPSY;  Surgeon: Leafy Ro, MD;  Location: [facility];  Service: General;  Laterality: Left;   BREAST RECONSTRUCTION WITH PLACEMENT OF TISSUE EXPANDER AND FLEX HD (ACELLULAR HYDRATED DERMIS) Bilateral 05/15/2021   Procedure: BILATERAL BREAST RECONSTRUCTION WITH PLACEMENT OF TISSUE EXPANDER AND FLEX HD (ACELLULAR HYDRATED DERMIS);  Surgeon: Allena Napoleon, MD;  Location: [facility];  Service: Plastics;  Laterality: Bilateral;   CESAREAN SECTION  1990   COLONOSCOPY WITH PROPOFOL N/A 01/04/2022   Procedure: COLONOSCOPY WITH PROPOFOL;  Surgeon: Wyline Mood, MD;  Location: [facility];  Service: Gastroenterology;  Laterality: N/A;   COLPOSCOPY  07/28/2018   CYSTOSCOPY N/A 11/15/2014   Procedure: CYSTOSCOPY;  Surgeon: Conard Novak, MD;  Location: [facility];  Service: Gynecology;  Laterality: N/A;   DILATION AND CURETTAGE OF UTERUS     GANGLION CYST EXCISION Left     REMOVAL OF BILATERAL TISSUE EXPANDERS  WITH PLACEMENT OF BILATERAL BREAST IMPLANTS Bilateral 10/02/2021   Procedure: REMOVAL OF BILATERAL TISSUE EXPANDERS WITH PLACEMENT OF BILATERAL BREAST IMPLANTS;  Surgeon: Allena Napoleon, MD;  Location: [facility];  Service: Plastics;  Laterality: Bilateral;   TOTAL MASTECTOMY Bilateral 05/15/2021   Procedure: TOTAL MASTECTOMY, Bilateral Simple Mastectomy, skin sparing;  Surgeon: Leafy Ro, MD;  Location: [facility];  Service: General;  Laterality: Bilateral;  Provider requesting for 2 hours / 120 min for procedure.   UTERINE FIBROID SURGERY      SOCIAL HISTORY: Social History   Socioeconomic History   Marital status: Married    Spouse name: Peyton Najjar   Number of children: 2   Years of education: Not on file   Highest education level: Not on file  Occupational History   Not on file  Tobacco Use   Smoking status: Never    Passive exposure: Never   Smokeless tobacco: Never  Vaping Use   Vaping status: Never Used  Substance and Sexual Activity   Alcohol use: Not Currently    Comment: occassional   Drug use: No   Sexual activity: Yes    Partners: Male    Birth control/protection: Surgical  Other Topics Concern   Not on file  Social History Narrative   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 Lives with husband.    Social Drivers of Corporate investment banker Strain: Low Risk  (08/09/2022)   Overall Financial Resource Strain (CARDIA)    Difficulty of Paying Living Expenses: Not hard at all  Food Insecurity: No Food Insecurity (08/09/2022)   Hunger Vital Sign    Worried About Running Out of Food in the Last Year: Never true    Ran Out of Food in the Last Year: Never true  Transportation Needs: No Transportation Needs (08/09/2022)   PRAPARE - Administrator, Civil Service (Medical): No    Lack of Transportation (Non-Medical): No  Physical Activity: Insufficiently Active  (08/09/2022)   Exercise Vital Sign    Days of Exercise per Week: 2 days    Minutes of Exercise per Session: 30 min  Stress: No Stress Concern Present (08/09/2022)   [employer] of Occupational Health - Occupational Stress Questionnaire    Feeling of Stress : Only a little  Social Connections: Socially Integrated (08/09/2022)   Social Connection and Isolation Panel [NHANES]    Frequency of Communication with Friends and Family: More than three times a week    Frequency of Social Gatherings with Friends and Family: More than three times a week    Attends Religious Services: More than 4 times per year    Active Member of [employer] or Organizations: No    Attends Engineer, structural: More than 4 times per year    Marital Status: Married  Catering manager Violence: Not At Risk (08/09/2022)   Humiliation, Afraid, Rape, and Kick questionnaire    Fear of Current or Ex-Partner: No    Emotionally Abused: No    Physically Abused: No    Sexually Abused: No    FAMILY HISTORY: Family History  Problem Relation Age of Onset   Allergic rhinitis Mother    Congestive Heart Failure Mother    Diabetes Mother    Hypertension Mother    Diabetes Maternal Grandmother    Congestive Heart Failure Maternal Grandfather    Breast cancer Neg Hx     ALLERGIES:  is allergic to  macadamia nut oil, apple juice, [place] nut (berthollefia excelsa), carrot oil, fruit & vegetable daily [nutritional supplements], kiwi extract, other, and peach [prunus persica].  MEDICATIONS:  Current Outpatient Medications  Medication Sig Dispense Refill   albuterol (PROVENTIL HFA) 108 (90 Base) MCG/ACT inhaler Inhale 1-2 puffs into the lungs every 4-6 hours as needed for cough/wheeze 6.7 g 0   ALPRAZolam (XANAX) 0.25 MG tablet Take 1 tablet (0.25 mg total) by mouth 2 (two) times daily as needed for anxiety (severe anxiety). 20 tablet 0   Ascorbic Acid (VITAMIN C) 100 MG tablet Take 100 mg by mouth daily.     atorvastatin  (LIPITOR) 10 MG tablet Take 1 tablet (10 mg total) by mouth daily at bedtime. 90 tablet 1   azelastine (ASTELIN) 0.1 % nasal spray Place 1-2 sprays into both nostrils 2 (two) times daily. 30 mL 6   busPIRone (BUSPAR) 10 MG tablet Take 1 tablet (10 mg total) by mouth 2 (two) times daily as needed (anxiety). 120 tablet 1   diphenhydrAMINE (BENADRYL) 2 % cream Apply 1 application topically 2 (two) times daily as needed for itching.     Docosanol 10 % CREA Apply 1 Application topically every 4 (four) hours. 2 g 0   ELDERBERRY PO Take 1 capsule by mouth daily.     EPINEPHrine 0.3 mg/0.3 mL IJ SOAJ injection Inject 0.3 mg into the muscle as needed, as directed. 2 each 1   ezetimibe (ZETIA) 10 MG tablet Take 1 tablet (10 mg total) by mouth daily. 90 tablet 3   fluticasone (FLONASE) 50 MCG/ACT nasal spray Place 1-2 sprays into both nostrils once daily. 16 g 6   Fluticasone-Umeclidin-Vilant (TRELEGY ELLIPTA) 200-62.5-25 MCG/ACT AEPB Inhale 1 puff into the lungs daily. 60 each 6   hydrocortisone cream 1 % Apply 1 application topically daily as needed for itching.     hydrOXYzine (VISTARIL) 25 MG capsule TAKE 1 CAPSULE (25 MG TOTAL) BY MOUTH AT BEDTIME AS NEEDED FOR UP TO 30 DOSES (DIFFICULTY SLEEPING). 30 capsule 1   ipratropium-albuterol (DUONEB) 0.5-2.5 (3) MG/3ML SOLN Take 3 mLs by nebulization every 6 (six) hours as needed (wheeze, SOB, cough variant asthma). 360 mL 1   levocetirizine (XYZAL) 5 MG tablet Take 1 tablet (5 mg total) by mouth every evening. 30 tablet 6   losartan (COZAAR) 25 MG tablet Take 1 tablet (25 mg total) by mouth every morning. 90 tablet 1   meloxicam (MOBIC) 15 MG tablet Take 1 tablet (15 mg total) by mouth daily. 30 tablet 2   montelukast (SINGULAIR) 10 MG tablet Take 1 tablet (10 mg total) by mouth every evening. 30 tablet 6   mupirocin ointment (BACTROBAN) 2 % Apply 1 Application topically 2 (two) times daily. 22 g 0   Olopatadine HCl (PATADAY) 0.2 % SOLN Instill 1 drop into  affected eye once daily 2.5 mL 6   Ruxolitinib Phosphate (OPZELURA) 1.5 % CREA Apply 1 application topically as directed 1- 2  times daily. 60 g 11   Semaglutide,0.25 or 0.5MG /DOS, (OZEMPIC, 0.25 OR 0.5 MG/DOSE,) 2 MG/3ML SOPN Inject 0.25-0.5 mg into the skin once a week. 9 mL 1   venlafaxine XR (EFFEXOR XR) 150 MG 24 hr capsule Take 1 capsule (150 mg total) by mouth daily with breakfast. 90 capsule 1   zolpidem (AMBIEN) 5 MG tablet Take 1 tablet (5 mg total) by mouth at bedtime as needed for sleep. 30 tablet 0   tamoxifen (NOLVADEX) 20 MG tablet Take 1 tablet (20 mg  total) by mouth daily. 90 tablet 1   No current facility-administered medications for this visit.     PHYSICAL EXAMINATION: ECOG PERFORMANCE STATUS: 0 - Asymptomatic  Vitals:   05/29/23 0934 05/29/23 0951  BP: (!) 149/100 (!) 314/98  Pulse: 87   Temp: 98.5 F (36.9 C)   SpO2: 100%    Filed Weights   05/29/23 0934  Weight: 174 lb 6.4 oz (79.1 kg)   Physical Exam Vitals and nursing note reviewed.  HENT:     Head: Normocephalic and atraumatic.     Mouth/Throat:     Pharynx: Oropharynx is clear.  Eyes:     Extraocular Movements: Extraocular movements intact.     Pupils: Pupils are equal, round, and reactive to light.  Cardiovascular:     Rate and Rhythm: Normal rate and regular rhythm.  Pulmonary:     Comments: Decreased breath sounds bilaterally.  Abdominal:     Palpations: Abdomen is soft.  Musculoskeletal:        General: Normal range of motion.     Cervical back: Normal range of motion.  Skin:    General: Skin is warm.  Neurological:     General: No focal deficit present.     Mental Status: She is alert and oriented to person, place, and time.  Psychiatric:        Behavior: Behavior normal.        Judgment: Judgment normal.    Mild lymphedema of left chest wall/Left Upper UE-   LABORATORY DATA:  I have reviewed the data as listed Lab Results  Component Value Date   WBC 8.3 02/10/2023   HGB  12.1 02/10/2023   HCT 37.6 02/10/2023   MCV 85.8 02/10/2023   PLT 369 02/10/2023   Recent Labs    11/13/22 1056 02/10/23 0939  NA 142 139  K 4.1 4.1  CL 107* 108  CO2 19* 24  GLUCOSE 94 94  BUN 10 15  CREATININE 0.74 0.86  CALCIUM 9.5 9.4  PROT 7.2 6.8  ALBUMIN 4.1  --   AST 18 17  ALT 22 18  ALKPHOS 56  --   BILITOT 0.3 0.2    RADIOGRAPHIC STUDIES: I have personally reviewed the radiological images as listed and agreed with the findings in the report. No results found.  ASSESSMENT & PLAN:   Carcinoma of upper-outer quadrant of left breast in female, estrogen receptor positive (HCC) #Left breast cancer T85mN0- G-2; ER/PR positive HER2 negative status BIL postmastectomy [positive for CHEK-2 mutation].  Oncotype recurrence score 16/LOW. On Tamoxifen [s/p hysterectomy [intact ovaries]; no role for mammograms.  Stable.  Sent Tam refills.   #Patient tolerated tamoxifen fairly well except for hot flashes.   # Left chest wall- lymphedema/ also affecting left carpal tunnel- refer to Maris[facility] Humble,   # Hot flashes-grade  2-3-recommend pharmacologic therapy- Effexor- Stable.     # Carpal tunnel- Left UE [[facility]]- also worse re: lymphedema- awaiting NCS.   # Cancer screening: colonoscopy [sep [email or web address] ]  # DISPOSITION:  # Refer to [facility]:FAOZ mastectomy lymphedema # follow up 6 months- MD; no labs- Dr.B    All questions were answered. The patient/family knows to call the clinic with any problems, questions or concerns.     Earna Coder, MD 05/29/2023 10:09 AM

## 2023-05-29 NOTE — Addendum Note (Signed)
Addended by: Clydia Llano on: 05/29/2023 11:01 AM   Modules accepted: Orders

## 2023-05-29 NOTE — Addendum Note (Signed)
Addended by: Clydia Llano on: 05/29/2023 10:28 AM   Modules accepted: Orders

## 2023-05-29 NOTE — Progress Notes (Signed)
Refill tamoxifen, pended.  Appetite 75%, fair life 1/day.  C/O numbness and tingling in 2 fingers on her left hand, asking if this could be from the mastectomy or carpal tunnel.

## 2023-05-29 NOTE — Assessment & Plan Note (Addendum)
#  Left breast cancer T20mN0- G-2; ER/PR positive HER2 negative status BIL postmastectomy [positive for CHEK-2 mutation].  Oncotype recurrence score 16/LOW. On Tamoxifen [s/p hysterectomy [intact ovaries]; no role for mammograms.  Stable.  Sent Tam refills.   #Patient tolerated tamoxifen fairly well except for hot flashes.   # Left chest wall- lymphedema/ also affecting left carpal tunnel- refer to [facility] Humble,   # Hot flashes-grade  2-3-recommend pharmacologic therapy- Effexor- Stable.     # Carpal tunnel- Left UE [[facility] orth[facility]]- also worse re: lymphedema- awaiting NCS.   # Cancer screening: colonoscopy [sep [email or web address] ]  # DISPOSITION:  # Refer to [facility]:XBJY mastectomy lymphedema # follow up 6 months- MD; no labs- Dr.B

## 2023-05-30 DIAGNOSIS — J3081 Allergic rhinitis due to animal (cat) (dog) hair and dander: Secondary | ICD-10-CM | POA: Diagnosis not present

## 2023-05-30 DIAGNOSIS — J3089 Other allergic rhinitis: Secondary | ICD-10-CM | POA: Diagnosis not present

## 2023-05-30 DIAGNOSIS — J301 Allergic rhinitis due to pollen: Secondary | ICD-10-CM | POA: Diagnosis not present

## 2023-06-02 ENCOUNTER — Other Ambulatory Visit (HOSPITAL_COMMUNITY): Payer: Self-pay

## 2023-06-04 ENCOUNTER — Inpatient Hospital Stay: Payer: Commercial Managed Care - PPO | Admitting: Occupational Therapy

## 2023-06-04 DIAGNOSIS — L905 Scar conditions and fibrosis of skin: Secondary | ICD-10-CM

## 2023-06-04 DIAGNOSIS — M25612 Stiffness of left shoulder, not elsewhere classified: Secondary | ICD-10-CM

## 2023-06-04 NOTE — Therapy (Signed)
[facility] - A Dept Of [place] [address], [address] [place], [place], [address] Phone: [phone]   Fax:  [phone]  Occupational Therapy Screen  Patient Details  Name: Tracey Huff MRN: [id number] Date of Birth: Aug 30, 1970 No data recorded  Encounter Date: 06/04/2023   OT End of Session - 06/04/23 1349     Visit Number 0             Past Medical History:  Diagnosis Date   Allergy    Anxiety    Asthma    Breast cancer (HCC)    INVASIVE MAMMARY CARCINOMA- left   Family history of prostate cancer    Fibroid uterus 11/15/2014   Hyperlipidemia    Hypertension    Menorrhagia with irregular cycle 11/15/2014   Pre-diabetes    Urticaria     Past Surgical History:  Procedure Laterality Date   ABDOMINAL HYSTERECTOMY N/A 11/15/2014   Procedure: HYSTERECTOMY ABDOMINAL/BILATERAL SALPINGECTOMY ;  Surgeon: Conard Novak, MD;  Location: [facility];  Service: Gynecology;  Laterality: N/A;   BREAST BIOPSY Right    2019 negative   BREAST BIOPSY Left 03/09/2021   u/s biopsy, 12-1 o'clock, VENUS" clip-path pending   BREAST LUMPECTOMY,RADIO FREQ LOCALIZER,AXILLARY SENTINEL LYMPH NODE BIOPSY Left 03/28/2021   Procedure: BREAST LUMPECTOMY,RADIO FREQ LOCALIZER,AXILLARY SENTINEL LYMPH NODE BIOPSY;  Surgeon: Leafy Ro, MD;  Location: [facility];  Service: General;  Laterality: Left;   BREAST RECONSTRUCTION WITH PLACEMENT OF TISSUE EXPANDER AND FLEX HD (ACELLULAR HYDRATED DERMIS) Bilateral 05/15/2021   Procedure: BILATERAL BREAST RECONSTRUCTION WITH PLACEMENT OF TISSUE EXPANDER AND FLEX HD (ACELLULAR HYDRATED DERMIS);  Surgeon: Allena Napoleon, MD;  Location: [facility];  Service: Plastics;  Laterality: Bilateral;   CESAREAN SECTION  1990   COLONOSCOPY WITH PROPOFOL N/A 01/04/2022   Procedure: COLONOSCOPY WITH PROPOFOL;  Surgeon: Wyline Mood, MD;  Location: [facility];  Service: Gastroenterology;  Laterality: N/A;    COLPOSCOPY  07/28/2018   CYSTOSCOPY N/A 11/15/2014   Procedure: CYSTOSCOPY;  Surgeon: Conard Novak, MD;  Location: [facility];  Service: Gynecology;  Laterality: N/A;   DILATION AND CURETTAGE OF UTERUS     GANGLION CYST EXCISION Left    REMOVAL OF BILATERAL TISSUE EXPANDERS WITH PLACEMENT OF BILATERAL BREAST IMPLANTS Bilateral 10/02/2021   Procedure: REMOVAL OF BILATERAL TISSUE EXPANDERS WITH PLACEMENT OF BILATERAL BREAST IMPLANTS;  Surgeon: Allena Napoleon, MD;  Location: [facility];  Service: Plastics;  Laterality: Bilateral;   TOTAL MASTECTOMY Bilateral 05/15/2021   Procedure: TOTAL MASTECTOMY, Bilateral Simple Mastectomy, skin sparing;  Surgeon: Leafy Ro, MD;  Location: [facility];  Service: General;  Laterality: Bilateral;  Provider requesting for 2 hours / 120 min for procedure.   UTERINE FIBROID SURGERY      There were no vitals filed for this visit.        LYMPHEDEMA/ONCOLOGY QUESTIONNAIRE - 06/04/23 0001       Right Upper Extremity Lymphedema   15 cm Proximal to Olecranon Process 39.4 cm    10 cm Proximal to Olecranon Process 36 cm    Olecranon Process 26.2 cm    15 cm Proximal to Ulnar Styloid Process 27.5 cm    10 cm Proximal to Ulnar Styloid Process 24.4 cm    Just Proximal to Ulnar Styloid Process 15.8 cm      Left Upper Extremity Lymphedema   15 cm Proximal to Olecranon Process 39 cm  10 cm Proximal to Olecranon Process 35.4 cm    Olecranon Process 27 cm    15 cm Proximal to Ulnar Styloid Process 28.2 cm    10 cm Proximal to Ulnar Styloid Process 25 cm    Just Proximal to Ulnar Styloid Process 15.8 cm               LYMPHEDEMA/ONCOLOGY QUESTIONNAIRE - 08/08/21 0001                Right Upper Extremity Lymphedema    15 cm Proximal to Olecranon Process 40 cm     10 cm Proximal to Olecranon Process 35.4 cm     Olecranon Process 27.5 cm     15 cm Proximal to Ulnar Styloid Process 27.4 cm     10 cm Proximal to Ulnar Styloid Process  24.4 cm          Left Upper Extremity Lymphedema    15 cm Proximal to Olecranon Process 40 cm     10 cm Proximal to Olecranon Process 36 cm     Olecranon Process 28 cm     15 cm Proximal to Ulnar Styloid Process 27.8 cm     10 cm Proximal to Ulnar Styloid Process 24.5 cm                     OT SCREEN 07/04/21:  Pt ed on lymphedema signs and symptoms and precautions- hand out provided and review Pt do show some increase in upper arm compare to L UE - but could be that she carry always everything on the L per pt  She did show  decease AROM end range flexion , ABD and ext rotation on the L more than the R  Pt had some lymphatic cording in axilla- soft tissue mobs done and was able to get 2 releases with increase AROM end range and less discomfort  Pt to do AAROM over head in supine - using cane or wand for flexion and ABD  Also showed her wall slides AAROM for shoulders in shower Deep breathing reviewed for expanding ribs and diaphragm - pt ed on HEP while she is getting more fillings And follow up with me in 2 wks - monitor ROM , cording and lymphedema  Pt in agreement      07/05/21 Plastic surgery follow up: Patient is a pleasant 53 year old female with PMH of left-sided breast cancer s/p bilateral breast reconstruction with tissue expanders and Flex HD performed 05/15/2021 by Dr. Arita Miss.     Patient was last seen here in the office on 06/20/2021.  At that time, exam was reassuring and patient was doing well.  100 cc was injected into each expander for a total of 300/450 cc each side.  Plan was for her to return in a couple of weeks for repeat expander fill.   Today, patient is doing well.  She has started occupational therapy and will be going once every 2 weeks.  She still will occasionally take NSAIDs and Tylenol as needed for discomfort, particular right after an expander fill.  Denies any redness, fevers, or unusual skin changes.  She states that she was a 36 B cup prior to her  mastectomy with immediate reconstruction.  She will try to continue gauging what expander size is appropriate prior to her implant exchange.  Would like to proceed with additional fill here today.   Physical exam is entirely reassuring.  No subcutaneous fluid collections or cellulitic changes.  We placed injectable saline in the Expander using a sterile technique: Right: 70 cc for a total of 370 / 450 cc Left: 70 cc for a total of 370 / 450 cc   Patient to return in 2 weeks for repeat expander fill and at that time determine whether or not she would like to proceed with implant change.            OT SCREEN 07/18/21 : Patient following up with OT after being seen 2 weeks ago She had 2 more feelings of expander since then Last time patient has some cording in axilla, had some release of cording and increased range of motion Patient reports she has been doing AAROM of bilateral shoulder flexion and abduction on wall with great results Patient do report some pulling in the axilla on the left with overhead shoulder flexion abduction Bilateral circumference of upper extremity decreased-left still increased compared to the right we will keep on monitoring Did some soft tissue massage manually by OT today in axilla and shoulder flexion and abduction end range Had some release of cording with great results Patient will have 2 more fillings possibly Patient to contact me or follow-up if needed prior to implants Patient to continue with home exercises      08/01/21 SURGEON NOTE:   Patient is a pleasant 53 year old female with PMH of left-sided breast cancer s/p bilateral breast reconstruction with tissue expanders and Flex HD performed 05/15/2021 by Dr. Arita Miss.  Patient was a 36 B/C cup prior to her mastectomy and reconstruction.   Patient was last seen here in the office on 07/19/2021.  At that time, exam was entirely reassuring.  70 cc was placed in the each expander for a total of 440/450 cc.  Plan  was for her to return in 10 days for likely final expander fill.   Today, patient is doing well.  She tells me that she was measured after her most recent expander fill and was told that she is approximately a C cup.  After hearing that, she tells me that she would like to be bigger.  She did not have any significant discomfort after left expander fills.   On exam, the skin on her left breast does feel a bit more taut than the right side.  There is also more superior pole fullness compared to the right side which appears to be mildly lower on the chest.  No redness or subcutaneous fluid collections noted.  Healed well.   She feels prepared for additional expander fill. We placed injectable saline in the Expander using a sterile technique: Right: 50 cc for a total of 490 / 450 cc Left: 50 cc for a total of 490 / 450 cc   After conclusion of expander fill, she does state that the left side is starting to feel a bit more tight.  Do not know if she will be able to tolerate additional fills.  We will have her return in 2 weeks for additional evaluation and likely final fill, if at all.  Discussed possibly using silicone tape over her mastectomy site incisions between now and her second phase of reconstruction.   OT SCREEN 08/08/21:  She had 2 more fillings of expander since t 3 weeks ago Patient was seen in the past by me for cording in axilla, had some release of cording and increased range of motion Patient arrived this date with great active range of motion in bilateral shoulders flexion, abduction and external rotation.  Patient do report some pulling but only on the lateral chest wall, could be because of the fillings.   Bilateral circumference of upper extremity-left still increased compared to the right  from med upper arm to proximal forearm - we will keep on monitoring Patient reports that she feels may be that she wants to stay at this size for breast, follow-up with surgeons next  week. Patient to contact me if needed after she getting her expanders removed.  Patient to continue with home program for active range of motion and prevention and monitoring lymphedema in left arm     06/04/23 OT SCREEN: Patient referred by Dr. Donneta Romberg for left upper extremity lymphedema.  Patient also has a history now of carpal tunnel syndrome. Patient was seen about 2 years ago.  After bilateral mastectomy with immediate reconstruction.  Patient is active range of motion and strength in bilateral upper extremity within normal limits Patient measurements compared to 2 years ago decreased-but per patient lost some weight. Patient measurements in circumference within normal limits compared to the right. Patient is right-hand dominant.  Patient works on a Animator during the day.  Patient do appear to have a little bit of a pocket of swelling on the left thoracic.  Patient was educated in the use of her unilateral postmastectomy Jovi pack breast pad that can be used after high risk activities and working out-if she feels that increasing swelling in that area. Patient was educated in the use of it.  Under compression bra or camisole. Patient will go to the DME company in [place]. Contact us if she needs a referral for insurance. Reviewed with patient ergonomics of set up of computer and telephone use. Patient can follow-up with me as needed.                                   Visit Diagnosis: Stiffness of left shoulder, not elsewhere classified  Scar condition and fibrosis of skin    Problem List Patient Active Problem List   Diagnosis Date Noted   Moderate episode of recurrent major depressive disorder (HCC) 01/31/2023   Mild persistent asthma with exacerbation 11/25/2022   Lip rash/swelling 11/13/2022   Lip swelling 11/13/2022   Pruritus 11/13/2022   Urticaria 11/13/2022   Other allergic rhinitis 11/13/2022   Other adverse food reactions, not  elsewhere classified, subsequent encounter 11/13/2022   Type 2 diabetes mellitus with hyperglycemia, with long-term current use of insulin (HCC) 07/26/2022   Colon cancer screening    Persistent adjustment disorder with anxiety 12/20/2021   Breast cancer (HCC) 05/15/2021   Genetic testing 05/10/2021   Family history of prostate cancer 04/17/2021   Allergic rhinitis 03/21/2021   Allergic rhinitis due to animal (cat) (dog) hair and dander 03/21/2021   Allergic rhinitis due to pollen 03/21/2021   Chronic allergic conjunctivitis 03/21/2021   Carcinoma of upper-outer quadrant of left breast in female, estrogen receptor positive (HCC) 03/21/2021   High grade squamous intraepithelial cervical dysplasia 01/29/2019   CIN III (cervical intraepithelial neoplasia grade III) with severe dysplasia 08/11/2018   Atypical squamous cell changes of undetermined significance (ASCUS) on cervical cytology with positive high risk human papilloma virus (HPV) 06/29/2018   Prediabetes 06/24/2018   Class 1 obesity due to excess calories with serious comorbidity and body mass index (BMI) of 33.0 to 33.9 in adult 05/20/2018   Essential hypertension 05/20/2018   Hyperlipidemia 05/20/2018   Asthma 05/20/2018  Anxiety in acute stress reaction 05/20/2018   Status post hysterectomy 11/15/2014    Oletta Cohn, OTR/L,CLT 06/04/2023, 1:52 PM  [facility] - A Dept Of [place]. [facility] [address] [place], [place], [address] Phone: [phone]   Fax:  [phone]  Name: TEEGAN GUINTHER MRN: [id number] Date of Birth: 06-27-1970

## 2023-06-06 DIAGNOSIS — J3081 Allergic rhinitis due to animal (cat) (dog) hair and dander: Secondary | ICD-10-CM | POA: Diagnosis not present

## 2023-06-06 DIAGNOSIS — J3089 Other allergic rhinitis: Secondary | ICD-10-CM | POA: Diagnosis not present

## 2023-06-06 DIAGNOSIS — J301 Allergic rhinitis due to pollen: Secondary | ICD-10-CM | POA: Diagnosis not present

## 2023-06-12 ENCOUNTER — Ambulatory Visit (INDEPENDENT_AMBULATORY_CARE_PROVIDER_SITE_OTHER): Payer: Commercial Managed Care - PPO | Admitting: Psychology

## 2023-06-12 DIAGNOSIS — F4322 Adjustment disorder with anxiety: Secondary | ICD-10-CM

## 2023-06-12 DIAGNOSIS — F4323 Adjustment disorder with mixed anxiety and depressed mood: Secondary | ICD-10-CM

## 2023-06-12 DIAGNOSIS — F4321 Adjustment disorder with depressed mood: Secondary | ICD-10-CM

## 2023-06-12 NOTE — Progress Notes (Signed)
 [facility] Counselor/Therapist Progress Note  Patient ID: Tracey Huff, MRN: [id number],    Date: 06/12/2023  Time Spent: 11:55am-12:58pm     Treatment Type: Individual Therapy   pt is seen in person.  Reported Symptoms:  pt reported anxiety and irritability at times.  Pt reports avoidance of emotions.     Mental Status Exam: Appearance:  N/a      Behavior: Appropriate  Motor: n/a  Speech/Language:  Clear and Coherent  Affect: Appropriate  Mood: anxious and sad  Thought process: normal  Thought content:   WNL  Sensory/Perceptual disturbances:   WNL  Orientation: oriented to person, place, time/date, and situation  Attention: Good  Concentration: Good  Memory: WNL  Fund of knowledge:  Good  Insight:   Good  Judgment:  Good  Impulse Control: Good   Risk Assessment: Danger to Self:  No Self-injurious Behavior: No Danger to Others: No Duty to Warn:no Physical Aggression / Violence:No  Access to Firearms a concern: No  Gang Involvement:No   Subjective: Counselor assessed pt current functioning per pt report.  Processed w/pt recent emotions and grief responses.  Explored emotions and discussed ways of expressing and not avoiding. Discussed feeing guarded and ways to surround w/ supports.     Pt affect congruent w/ emotions.  Pt teary at times.  Pt reports that she is continuing to clean out husband's items and prepare to move once buys.  Pt reports that she feels anxious at times, irritable and angry at times and sometimes sad.  Pt recognizes that she is avoiding feeling and trying to keep busy.  Pt recognizes also her guard up w/ others given hx of things in past several years.  Pt discussed focus on her supports and engaging w/ those.  Pt recognize ways can acknowledge and name emotions.   Interventions: Cognitive Behavioral Therapy and supportive  Diagnosis:Adjustment disorder with anxiety  Grief  Plan: Pt f/u in 2 weeks for counseling.  Pt to f/u a  scheduled w/ PCP.    Individualized Treatment Plan Strengths: seeking counseling   Supports: daughters, friends    Goal/Needs for Treatment:  In order of importance to patient 1) increase appropriate expression of feelings.   2) cope w/ stressors 3) ---    Client Statement of Needs: I do things to cover up and not feel.  Creating safe space to talk.  Trying to find myself- for last 4 years I had to take care of him and put my self off.     Treatment Level: outpt counseling  Symptoms: feeling anxious/on edge, worry, irritability  Client Treatment Preferences:biweekly counseling. Continue med management w/ PCP    Healthcare consumer's goal for treatment:   Counselor, Damien Herald, [facility] will support the patient's ability to achieve the goals identified. Cognitive Behavioral Therapy, Assertive Communication/Conflict Resolution Training, Relaxation Training, ACT, Humanistic and other evidenced-based practices will be used to promote progress towards healthy functioning.    Healthcare consumer will: Actively participate in therapy, working towards healthy functioning.     *Justification for Continuation/Discontinuation of Goal: R=Revised, O=Ongoing, A=Achieved, D=Discontinued   Goal 1) Increase pt verbal expression of feelings and emotions. Baseline date 09/10/22: Progress towards goal 0; How Often - Daily Target Date Goal Was reviewed Status Code Progress towards goal/Likert rating  09/10/23                            Goal 2) Increase self  care and coping skills daily to manage stress to reduce anxiety and irritability AEB Pt report and therapist observation. Baseline date 09/10/22: Progress towards goal 0; How Often - Daily Target Date Goal Was reviewed Status Code Progress towards goal  09/10/23                            This plan has been reviewed and created by the following participants:  This plan will be reviewed at least every 12 months. Date Behavioral Health Clinician Date  Guardian/Patient   09/10/22          [facility] Barbarann [facility] 09/10/22 Verbal Consent Provided                       BARBARANN APPL LCMHC

## 2023-06-25 ENCOUNTER — Ambulatory Visit: Payer: Commercial Managed Care - PPO | Admitting: Psychology

## 2023-06-25 DIAGNOSIS — F4323 Adjustment disorder with mixed anxiety and depressed mood: Secondary | ICD-10-CM | POA: Diagnosis not present

## 2023-06-25 DIAGNOSIS — F4321 Adjustment disorder with depressed mood: Secondary | ICD-10-CM

## 2023-06-25 DIAGNOSIS — F4322 Adjustment disorder with anxiety: Secondary | ICD-10-CM

## 2023-06-25 NOTE — Progress Notes (Signed)
  Behavioral Health Counselor/Therapist Progress Note  Patient ID: Tracey Huff, MRN: [id number],    Date: 06/25/2023  Time Spent: 2:30pm-3:00pm     Treatment Type: Individual Therapy   pt is seen for a virtual audio visit.  Pt is unable to access video due to technology issues. 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  Pt consents to virtual visit and is aware of limitations of such visits.      Reported Symptoms:  pt reported coped through anniversary date, pt reports boundaries for self  Mental Status Exam: Appearance:  N/a      Behavior: Appropriate  Motor: n/a  Speech/Language:  Clear and Coherent  Affect: Appropriate  Mood: anxious  Thought process: normal  Thought content:   WNL  Sensory/Perceptual disturbances:   WNL  Orientation: oriented to person, place, time/date, and situation  Attention: Good  Concentration: Good  Memory: WNL  Fund of knowledge:  Good  Insight:   Good  Judgment:  Good  Impulse Control: Good   Risk Assessment: Danger to Self:  No Self-injurious Behavior: No Danger to Others: No Duty to Warn:no Physical Aggression / Violence:No  Access to Firearms a concern: No  Gang Involvement:No   Subjective: Counselor assessed pt current functioning per pt report.  Processed w/pt emotions, grief  and coping.  Explored interactions and healthy boundaries for self.  Discussed goals pt working towards and actions consistent with.   Pt affect wnl.  Pt reports continues to work out and engaged w/ family and friends.  Pt reports yesterday was wedding anniversary.  Pt reports teary at times but feels that coped well through.  Pt reports some anxiety at times.  Pt reports that she is aware interactions w/ others and feeling vulnerable and not ready for dating and keeping as friend interactions.  Pt reports she is focused on purchasing a house and has begun looking again and aware of not spending and reducing debt.     Interventions: Cognitive Behavioral Therapy and supportive  Diagnosis:Adjustment disorder with anxiety  Grief  Plan: Pt f/u in 2 weeks for counseling.  Pt to f/u a scheduled w/ PCP.    Individualized Treatment Plan Strengths: seeking counseling   Supports: daughters, friends    Goal/Needs for Treatment:  In order of importance to patient 1) increase appropriate expression of feelings.   2) cope w/ stressors 3) ---    Client Statement of Needs: "I do things to cover up and not feel.  Creating safe space to talk.  Trying to find myself- for last 4 years I had to take care of him and put my self off. "    Treatment Level: outpt counseling  Symptoms: feeling anxious/on edge, worry, irritability  Client Treatment Preferences:biweekly counseling. Continue med management w/ PCP    Healthcare consumer's goal for treatment:   Counselor, Forde Radon, [facility] will support the patient's ability to achieve the goals identified. Cognitive Behavioral Therapy, Assertive Communication/Conflict Resolution Training, Relaxation Training, ACT, Humanistic and other evidenced-based practices will be used to promote progress towards healthy functioning.    Healthcare consumer will: Actively participate in therapy, working towards healthy functioning.     *Justification for Continuation/Discontinuation of Goal: R=Revised, O=Ongoing, A=Achieved, D=Discontinued   Goal 1) Increase pt verbal expression of feelings and emotions. Baseline date 09/10/22: Progress towards goal 0; How Often - Daily Target Date Goal Was reviewed Status Code Progress towards goal/Likert rating  09/10/23  Goal 2) Increase self care and coping skills daily to manage stress to reduce anxiety and irritability AEB Pt report and therapist observation. Baseline date 09/10/22: Progress towards goal 0; How Often - Daily Target Date Goal Was reviewed Status Code Progress towards goal  09/10/23                             This plan has been reviewed and created by the following participants:  This plan will be reviewed at least every 12 months. Date Behavioral Health Clinician Date Guardian/Patient   09/10/22          [name] 09/10/22 Verbal Consent Provided                             [name]

## 2023-06-27 DIAGNOSIS — J3081 Allergic rhinitis due to animal (cat) (dog) hair and dander: Secondary | ICD-10-CM | POA: Diagnosis not present

## 2023-06-27 DIAGNOSIS — J3089 Other allergic rhinitis: Secondary | ICD-10-CM | POA: Diagnosis not present

## 2023-06-27 DIAGNOSIS — J301 Allergic rhinitis due to pollen: Secondary | ICD-10-CM | POA: Diagnosis not present

## 2023-07-03 DIAGNOSIS — J3089 Other allergic rhinitis: Secondary | ICD-10-CM | POA: Diagnosis not present

## 2023-07-03 DIAGNOSIS — J3081 Allergic rhinitis due to animal (cat) (dog) hair and dander: Secondary | ICD-10-CM | POA: Diagnosis not present

## 2023-07-03 DIAGNOSIS — J301 Allergic rhinitis due to pollen: Secondary | ICD-10-CM | POA: Diagnosis not present

## 2023-07-09 ENCOUNTER — Ambulatory Visit: Payer: Commercial Managed Care - PPO | Admitting: Psychology

## 2023-07-09 DIAGNOSIS — F4321 Adjustment disorder with depressed mood: Secondary | ICD-10-CM

## 2023-07-09 DIAGNOSIS — F4322 Adjustment disorder with anxiety: Secondary | ICD-10-CM

## 2023-07-09 DIAGNOSIS — F4323 Adjustment disorder with mixed anxiety and depressed mood: Secondary | ICD-10-CM

## 2023-07-09 NOTE — Progress Notes (Signed)
 [facility] Counselor/Therapist Progress Note  Patient ID: Tracey Huff, MRN: [id number],    Date: 07/09/2023  Time Spent: 2:32pm-3:13pm     Treatment Type: Individual Therapy   pt is seen for a virtual audio visit.  Pt is unable to access video due to technology issues. 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  Pt consents to virtual visit and is aware of limitations of such visits.      Reported Symptoms:  pt reported mood good.  Pt feeling encouraged w/ steps taken towards her goals w/ book and purchasing home.    Mental Status Exam: Appearance:  N/a      Behavior: Appropriate  Motor: n/a  Speech/Language:  Clear and Coherent  Affect: Appropriate  Mood: anxious  Thought process: normal  Thought content:   WNL  Sensory/Perceptual disturbances:   WNL  Orientation: oriented to person, place, time/date, and situation  Attention: Good  Concentration: Good  Memory: WNL  Fund of knowledge:  Good  Insight:   Good  Judgment:  Good  Impulse Control: Good   Risk Assessment: Danger to Self:  No Self-injurious Behavior: No Danger to Others: No Duty to Warn:no Physical Aggression / Violence:No  Access to Firearms a concern: No  Gang Involvement:No   Subjective: Counselor assessed pt current functioning per pt report.  Processed w/pt emotions, positives and stressors.  Explored healthy boundaries in relationships.  Discussed pt personal goals and positive steps towards.   Pt affect wnl.  Pt reports mood has been good in past couple weeks.  Pt reports some worries on things, but feels that coping through.  Pt reported that she is excited w/ progress she is making towards book writing and next steps taking.  Pt reports that she is making progress towards buying a home as well.  Pt recognized boundaries she is keeping in relationships as friends.     Interventions: Cognitive Behavioral Therapy and supportive  Diagnosis:Adjustment  disorder with anxiety  Grief  Plan: Pt f/u in 2 weeks for counseling.  Pt to f/u a scheduled w/ PCP.    Individualized Treatment Plan Strengths: seeking counseling   Supports: daughters, friends    Goal/Needs for Treatment:  In order of importance to patient 1) increase appropriate expression of feelings.   2) cope w/ stressors 3) ---    Client Statement of Needs: "I do things to cover up and not feel.  Creating safe space to talk.  Trying to find myself- for last 4 years I had to take care of him and put my self off. "    Treatment Level: outpt counseling  Symptoms: feeling anxious/on edge, worry, irritability  Client Treatment Preferences:biweekly counseling. Continue med management w/ PCP    Healthcare consumer's goal for treatment:   Counselor, Forde Radon, [facility] will support the patient's ability to achieve the goals identified. Cognitive Behavioral Therapy, Assertive Communication/Conflict Resolution Training, Relaxation Training, ACT, Humanistic and other evidenced-based practices will be used to promote progress towards healthy functioning.    Healthcare consumer will: Actively participate in therapy, working towards healthy functioning.     *Justification for Continuation/Discontinuation of Goal: R=Revised, O=Ongoing, A=Achieved, D=Discontinued   Goal 1) Increase pt verbal expression of feelings and emotions. Baseline date 09/10/22: Progress towards goal 0; How Often - Daily Target Date Goal Was reviewed Status Code Progress towards goal/Likert rating  09/10/23  Goal 2) Increase self care and coping skills daily to manage stress to reduce anxiety and irritability AEB Pt report and therapist observation. Baseline date 09/10/22: Progress towards goal 0; How Often - Daily Target Date Goal Was reviewed Status Code Progress towards goal  09/10/23                            This plan has been reviewed and created by the following participants:  This  plan will be reviewed at least every 12 months. Date Behavioral Health Clinician Date Guardian/Patient   09/10/22          [facility] [name] [facility] 09/10/22 Verbal Consent Provided                          Forde Radon [facility]

## 2023-07-10 DIAGNOSIS — J3081 Allergic rhinitis due to animal (cat) (dog) hair and dander: Secondary | ICD-10-CM | POA: Diagnosis not present

## 2023-07-10 DIAGNOSIS — J301 Allergic rhinitis due to pollen: Secondary | ICD-10-CM | POA: Diagnosis not present

## 2023-07-10 DIAGNOSIS — J3089 Other allergic rhinitis: Secondary | ICD-10-CM | POA: Diagnosis not present

## 2023-07-15 DIAGNOSIS — H524 Presbyopia: Secondary | ICD-10-CM | POA: Diagnosis not present

## 2023-07-25 DIAGNOSIS — J3089 Other allergic rhinitis: Secondary | ICD-10-CM | POA: Diagnosis not present

## 2023-07-25 DIAGNOSIS — J3081 Allergic rhinitis due to animal (cat) (dog) hair and dander: Secondary | ICD-10-CM | POA: Diagnosis not present

## 2023-07-25 DIAGNOSIS — J301 Allergic rhinitis due to pollen: Secondary | ICD-10-CM | POA: Diagnosis not present

## 2023-08-01 DIAGNOSIS — J3081 Allergic rhinitis due to animal (cat) (dog) hair and dander: Secondary | ICD-10-CM | POA: Diagnosis not present

## 2023-08-01 DIAGNOSIS — J3089 Other allergic rhinitis: Secondary | ICD-10-CM | POA: Diagnosis not present

## 2023-08-01 DIAGNOSIS — J301 Allergic rhinitis due to pollen: Secondary | ICD-10-CM | POA: Diagnosis not present

## 2023-08-07 ENCOUNTER — Ambulatory Visit: Admitting: Psychology

## 2023-08-07 DIAGNOSIS — F4323 Adjustment disorder with mixed anxiety and depressed mood: Secondary | ICD-10-CM

## 2023-08-07 DIAGNOSIS — F4321 Adjustment disorder with depressed mood: Secondary | ICD-10-CM

## 2023-08-07 DIAGNOSIS — F4322 Adjustment disorder with anxiety: Secondary | ICD-10-CM

## 2023-08-07 NOTE — Progress Notes (Signed)
 [facility] Counselor/Therapist Progress Note  Patient ID: Tracey Huff, MRN: [id number],    Date: 08/07/2023  Time Spent: 1:30pm-2:25pm     Treatment Type: Individual Therapy   pt is seen for an in person visit  Reported Symptoms:  pt reported mood good. Pt reports taking positive steps towards personal goals  Mental Status Exam: Appearance:  N/a      Behavior: Appropriate  Motor: n/a  Speech/Language:  Clear and Coherent  Affect: Appropriate  Mood: normal  Thought process: normal  Thought content:   WNL  Sensory/Perceptual disturbances:   WNL  Orientation: oriented to person, place, time/date, and situation  Attention: Good  Concentration: Good  Memory: WNL  Fund of knowledge:  Good  Insight:   Good  Judgment:  Good  Impulse Control: Good   Risk Assessment: Danger to Self:  No Self-injurious Behavior: No Danger to Others: No Duty to Warn:no Physical Aggression / Violence:No  Access to Firearms a concern: No  Gang Involvement:No   Subjective: Counselor assessed pt current functioning per pt report.  Processed w/pt positives and stressors.  Explored mood and decreased anxiety recent.  Reflected positive engagement w/ activities, interests and positive interactions.  Discussed grief and relationships and healthy boundaries.   Pt affect wnl.  Pt reports mood has been good less anxiety present.  Pt reports some moments of sadness w/ grief but also positive emotions, engagement and connections.  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  Pt reported found house that closing on 08/27/23.  Pt reports exploring relationship and taking slowly and recognizing giving self space to see how develops.    Interventions: Cognitive Behavioral Therapy and supportive  Diagnosis:Adjustment disorder with anxiety  Grief  Plan: Pt f/u in 3-4 weeks for counseling.  Pt to f/u a scheduled w/ PCP.    Individualized Treatment Plan Strengths: seeking counseling    Supports: daughters, friends    Goal/Needs for Treatment:  In order of importance to patient 1) increase appropriate expression of feelings.   2) cope w/ stressors 3) ---    Client Statement of Needs: "I do things to cover up and not feel.  Creating safe space to talk.  Trying to find myself- for last 4 years I had to take care of him and put my self off. "    Treatment Level: outpt counseling  Symptoms: feeling anxious/on edge, worry, irritability  Client Treatment Preferences:biweekly counseling. Continue med management w/ PCP    Healthcare consumer's goal for treatment:   Counselor, Forde Radon, [facility] will support the patient's ability to achieve the goals identified. Cognitive Behavioral Therapy, Assertive Communication/Conflict Resolution Training, Relaxation Training, ACT, Humanistic and other evidenced-based practices will be used to promote progress towards healthy functioning.    Healthcare consumer will: Actively participate in therapy, working towards healthy functioning.     *Justification for Continuation/Discontinuation of Goal: R=Revised, O=Ongoing, A=Achieved, D=Discontinued   Goal 1) Increase pt verbal expression of feelings and emotions. Baseline date 09/10/22: Progress towards goal 0; How Often - Daily Target Date Goal Was reviewed Status Code Progress towards goal/Likert rating  09/10/23                            Goal 2) Increase self care and coping skills daily to manage stress to reduce anxiety and irritability AEB Pt report and therapist observation. Baseline date 09/10/22: Progress towards goal 0; How Often - Daily Target Date Goal  Was reviewed Status Code Progress towards goal  09/10/23                            This plan has been reviewed and created by the following participants:  This plan will be reviewed at least every 12 months. Date Behavioral Health Clinician Date Guardian/Patient   09/10/22          [facility] [name] [facility] 09/10/22 Verbal Consent  Provided                            Forde Radon [facility]

## 2023-08-08 DIAGNOSIS — J3089 Other allergic rhinitis: Secondary | ICD-10-CM | POA: Diagnosis not present

## 2023-08-08 DIAGNOSIS — J301 Allergic rhinitis due to pollen: Secondary | ICD-10-CM | POA: Diagnosis not present

## 2023-08-08 DIAGNOSIS — J3081 Allergic rhinitis due to animal (cat) (dog) hair and dander: Secondary | ICD-10-CM | POA: Diagnosis not present

## 2023-08-08 NOTE — Progress Notes (Signed)
 BP 126/82   Pulse 99   Resp 18   Ht 5' (1.524 m)   Wt 164 lb 8 oz (74.6 kg)   LMP 11/15/2014   SpO2 99%   BMI 32.13 kg/m    Subjective:    Patient ID: Tracey Huff, female    DOB: May 02, 1971, 53 y.o.   MRN: [id number]  HPI: Tracey Huff is a 53 y.o. female  Chief Complaint  Patient presents with   Medical Management of Chronic Issues   Patient presents to clinic for 6 month follow up. Patient has a history of left breast carcinoma that is estrogen receptor positive, and is currently taking tamoxifen 20mg  daily, condition is managed by oncology.   Patient reports that asthma is well controlled with current medications, denies any recent exacerbation, currently taking trelegy 200-62.5-25 daily, singular 10mg  daily, albuterol daily PRN.  Hypertension:  -Medications: losartan 25mg  daily -Patient is compliant with above medications and reports no side effects. -Checking BP at home (average): 129/72 -Highest BP at home: 130/82 -Lowest BP at home: 126/72 -Denies any SOB, CP, vision changes, LE edema or symptoms of hypotension -Diet: maintains a well balanced diet, decreased red meat intake -Exercise: go to the gym at 5 days a week   HLD:  -Medications: atorvastatin 10mg  daily, ezetimibe 10 mg daily -Patient is compliant with above medications and reports no side effects.  -Last lipid panel:   Lipid Panel     Component Value Date/Time   CHOL 204 (H) 02/10/2023 0939   TRIG 124 02/10/2023 0939   HDL 57 02/10/2023 0939   CHOLHDL 3.6 02/10/2023 0939   LDLCALC 124 (H) 02/10/2023 0939   LDLDIRECT 180 (H) 11/08/2020 1105     Diabetes, Type 2:  -Last A1c 6.5 -Medications: ozempic 0.5mg  weekly, -Patient is compliant with the above medications and reports no side effects.  -Checking BG at home: recently purchased glucometer, planning to start checking BG at home this week   -Diet: maintains a well balanced diet, decreased red meat intake -Exercise: go to the gym at  5 days a week , weight training and cardio -Eye exam: 3 weeks ago -Foot exam: 08/11/23 -Microalbumin: 08/11/23 -Statin: 08/11/23 -PNA vaccine: completed -Denies symptoms of hypoglycemia, polyuria, polydipsia, numbness extremities, foot ulcers/trauma.   Diabetic Foot Exam - Simple   Simple Foot Form Diabetic Foot exam was performed with the following findings: Yes 08/11/2023 10:11 AM  Visual Inspection No deformities, no ulcerations, no other skin breakdown bilaterally: Yes Sensation Testing Intact to touch and monofilament testing bilaterally: Yes Pulse Check Posterior Tibialis and Dorsalis pulse intact bilaterally: Yes Comments      Obesity:  Current weight : 164 lb BMI: 32.13 Highest weight:179 lb Treatment Tried: Ozempic Comorbidities: HTN, HLD, asthma, DM type 2   Depression/anxiety Medication: effexor 150mg  daily, buspar 10mg  BID PRN Compliant yes Side effects none PHQ9 negative GAD score 5, medication and therapy are helpful Therapy yes       08/11/2023    9:34 AM 08/11/2023    9:08 AM 02/10/2023    9:14 AM 01/31/2023   12:16 PM  GAD 7 : Generalized Anxiety Score  Nervous, Anxious, on Edge 1 1 1 2   Control/stop worrying 1 0 0 1  Worry too much - different things 0 1 1 1   Trouble relaxing 0 1 0 2  Restless 1 0 0 1  Easily annoyed or irritable 1 1 1 2   Afraid - awful might happen 1 1 1  1  Total GAD 7 Score 5 5 4 10   Anxiety Difficulty Somewhat difficult Not difficult at all  Somewhat difficult         08/11/2023    9:05 AM 02/10/2023    9:14 AM 01/31/2023   12:11 PM  Depression screen PHQ 2/9  Decreased Interest 0 0 0  Down, Depressed, Hopeless 0 0 1  PHQ - 2 Score 0 0 1  Altered sleeping 1 1 1   Tired, decreased energy 0 0 0  Change in appetite 0 1 1  Feeling bad or failure about yourself  1 1 1   Trouble concentrating 0 0 1  Moving slowly or fidgety/restless 0 0 1  Suicidal thoughts 0 0 0  PHQ-9 Score 2 3 6   Difficult doing work/chores Somewhat difficult  Somewhat difficult Not difficult at all    Relevant past medical, surgical, family and social history reviewed and updated as indicated. Interim medical history since our last visit reviewed. Allergies and medications reviewed and updated.  Review of Systems Constitutional: Negative for fever or weight change.  Respiratory: Negative for cough and shortness of breath.   Cardiovascular: Negative for chest pain or palpitations.  Gastrointestinal: Negative for abdominal pain, no bowel changes.  Musculoskeletal: Negative for gait problem or joint swelling.  Skin: Negative for rash.  Neurological: Negative for dizziness or headache.    Physical Exam Constitutional:      Appearance: Normal appearance.  HENT:     Head: Normocephalic.  Eyes:     Extraocular Movements: Extraocular movements intact.     Pupils: Pupils are equal, round, and reactive to light.  Cardiovascular:     Rate and Rhythm: Normal rate and regular rhythm.  Pulmonary:     Effort: Pulmonary effort is normal.     Breath sounds: Normal breath sounds.  Neurological:     Mental Status: She is alert and oriented to person, place, and time.  Psychiatric:        Mood and Affect: Mood normal.        Behavior: Behavior normal.        Thought Content: Thought content normal.        Judgment: Judgment normal.         Objective:    BP 126/82   Pulse 99   Resp 18   Ht 5' (1.524 m)   Wt 164 lb 8 oz (74.6 kg)   LMP 11/15/2014   SpO2 99%   BMI 32.13 kg/m    Wt Readings from Last 3 Encounters:  08/11/23 164 lb 8 oz (74.6 kg)  05/29/23 174 lb 6.4 oz (79.1 kg)  03/26/23 175 lb 6.4 oz (79.6 kg)   Results for orders placed or performed in visit on 03/12/23  Alpha-Gal Panel   Collection Time: 03/28/23 11:10 AM  Result Value Ref Range   Class Description Allergens Comment    IgE (Immunoglobulin E), Serum 290 6 - 495 IU/mL   Pork IgE <0.10 Class 0 kU/L   Beef IgE <0.10 Class 0 kU/L   Allergen Lamb IgE <0.10 Class 0  kU/L   O215-IgE Alpha-Gal <0.10 Class 0 kU/L       Assessment & Plan:   Problem List Items Addressed This Visit       Cardiovascular and Mediastinum   Essential hypertension   Condition is stable. Patient is currently taking losartan 25 mg daily        Respiratory   Mild persistent asthma with exacerbation   Condition is stable, denies  any recent exacerbations. Currently taking - singular 10mg  daily - trelegy 200-62.5-25 mcg/act daily - albuterol 100 (90 base) mcg/act PRN      Relevant Medications   albuterol (PROVENTIL HFA) 108 (90 Base) MCG/ACT inhaler   montelukast (SINGULAIR) 10 MG tablet   Fluticasone-Umeclidin-Vilant (TRELEGY ELLIPTA) 200-62.5-25 MCG/ACT AEPB     Endocrine   Type 2 diabetes mellitus with hyperglycemia, with long-term current use of insulin (HCC)   Currently taking ozempic 0.5mg  inj weekly. Last A1c was 6.5, labs pending      Relevant Medications   Semaglutide,0.25 or 0.5MG /DOS, (OZEMPIC, 0.25 OR 0.5 MG/DOSE,) 2 MG/3ML SOPN   Other Relevant Orders   HM Diabetes Foot Exam (Completed)   Comprehensive metabolic panel with GFR   Hemoglobin A1c   Microalbumin / creatinine urine ratio     Other   Class 1 obesity due to excess calories with serious comorbidity and body mass index (BMI) of 33.0 to 33.9 in adult   Patient is currently taking ozempic 0.5mg  inj weekly. Current weight is 164 lb, will recheck weight at next follow up appointment.      Relevant Medications   Semaglutide,0.25 or 0.5MG /DOS, (OZEMPIC, 0.25 OR 0.5 MG/DOSE,) 2 MG/3ML SOPN   Hyperlipidemia   Currently taking atorvastatin 10mg  daily, ezetimibe 10mg  daily. Labs pending      Relevant Orders   Lipid panel   Carcinoma of upper-outer quadrant of left breast in female, estrogen receptor positive (HCC)   Managed by oncology, upcoming appointment. Currently taking tamoxifen 20mg  daily.      Relevant Orders   CBC with Differential/Platelet   Moderate episode of recurrent major  depressive disorder (HCC)   Condition is stable. PHQ9 is negative. Will continue to monitor      Other Visit Diagnoses       Immunization due    -  Primary   Relevant Orders   Pneumococcal conjugate vaccine 20-valent (Prevnar 20) (Completed)        Follow up plan: Return for cpe.

## 2023-08-11 ENCOUNTER — Encounter: Payer: Self-pay | Admitting: Nurse Practitioner

## 2023-08-11 ENCOUNTER — Other Ambulatory Visit (HOSPITAL_COMMUNITY): Payer: Self-pay

## 2023-08-11 ENCOUNTER — Ambulatory Visit: Payer: Commercial Managed Care - PPO | Admitting: Nurse Practitioner

## 2023-08-11 VITALS — BP 126/82 | HR 99 | Resp 18 | Ht 60.0 in | Wt 164.5 lb

## 2023-08-11 DIAGNOSIS — Z794 Long term (current) use of insulin: Secondary | ICD-10-CM

## 2023-08-11 DIAGNOSIS — E1165 Type 2 diabetes mellitus with hyperglycemia: Secondary | ICD-10-CM

## 2023-08-11 DIAGNOSIS — Z23 Encounter for immunization: Secondary | ICD-10-CM

## 2023-08-11 DIAGNOSIS — I10 Essential (primary) hypertension: Secondary | ICD-10-CM | POA: Diagnosis not present

## 2023-08-11 DIAGNOSIS — Z170 Estrogen receptor positive status [ER+]: Secondary | ICD-10-CM

## 2023-08-11 DIAGNOSIS — J4531 Mild persistent asthma with (acute) exacerbation: Secondary | ICD-10-CM

## 2023-08-11 DIAGNOSIS — E785 Hyperlipidemia, unspecified: Secondary | ICD-10-CM

## 2023-08-11 DIAGNOSIS — C50412 Malignant neoplasm of upper-outer quadrant of left female breast: Secondary | ICD-10-CM | POA: Diagnosis not present

## 2023-08-11 DIAGNOSIS — E66811 Obesity, class 1: Secondary | ICD-10-CM | POA: Diagnosis not present

## 2023-08-11 DIAGNOSIS — Z6833 Body mass index (BMI) 33.0-33.9, adult: Secondary | ICD-10-CM

## 2023-08-11 DIAGNOSIS — F331 Major depressive disorder, recurrent, moderate: Secondary | ICD-10-CM

## 2023-08-11 MED ORDER — OZEMPIC (0.25 OR 0.5 MG/DOSE) 2 MG/3ML ~~LOC~~ SOPN
0.2500 mg | PEN_INJECTOR | SUBCUTANEOUS | 1 refills | Status: DC
  Filled 2023-08-11: qty 9, 84d supply, fill #0

## 2023-08-11 MED ORDER — TRELEGY ELLIPTA 200-62.5-25 MCG/ACT IN AEPB
1.0000 | INHALATION_SPRAY | Freq: Every day | RESPIRATORY_TRACT | 6 refills | Status: AC
  Filled 2023-08-11: qty 60, 30d supply, fill #0
  Filled 2024-05-19: qty 60, 30d supply, fill #1

## 2023-08-11 MED ORDER — MONTELUKAST SODIUM 10 MG PO TABS
10.0000 mg | ORAL_TABLET | Freq: Every evening | ORAL | 6 refills | Status: AC
Start: 2023-08-11 — End: ?
  Filled 2023-08-11: qty 30, 30d supply, fill #0

## 2023-08-11 MED ORDER — OZEMPIC (0.25 OR 0.5 MG/DOSE) 2 MG/3ML ~~LOC~~ SOPN
0.5000 mg | PEN_INJECTOR | SUBCUTANEOUS | 1 refills | Status: DC
  Filled 2023-08-11: qty 9, 84d supply, fill #0
  Filled 2023-12-15 – 2023-12-18 (×2): qty 3, 28d supply, fill #1
  Filled 2024-02-06: qty 3, 28d supply, fill #2
  Filled 2024-03-08: qty 3, 28d supply, fill #3

## 2023-08-11 MED ORDER — ALBUTEROL SULFATE HFA 108 (90 BASE) MCG/ACT IN AERS
1.0000 | INHALATION_SPRAY | RESPIRATORY_TRACT | 0 refills | Status: DC | PRN
  Filled 2023-08-11: qty 6.7, 16d supply, fill #0

## 2023-08-11 NOTE — Assessment & Plan Note (Signed)
 Condition is stable. PHQ9 is negative. Will continue to monitor

## 2023-08-11 NOTE — Assessment & Plan Note (Signed)
 Currently taking ozempic 0.5mg  inj weekly. Last A1c was 6.5, labs pending

## 2023-08-11 NOTE — Assessment & Plan Note (Signed)
 Condition is stable. Patient is currently taking losartan 25 mg daily

## 2023-08-11 NOTE — Assessment & Plan Note (Signed)
 Currently taking atorvastatin 10mg  daily, ezetimibe 10mg  daily. Labs pending

## 2023-08-11 NOTE — Assessment & Plan Note (Signed)
 Patient is currently taking ozempic 0.5mg  inj weekly. Current weight is 164 lb, will recheck weight at next follow up appointment.

## 2023-08-11 NOTE — Assessment & Plan Note (Signed)
 Condition is stable, denies any recent exacerbations. Currently taking - singular 10mg  daily - trelegy 200-62.5-25 mcg/act daily - albuterol 100 (90 base) mcg/act PRN

## 2023-08-11 NOTE — Assessment & Plan Note (Signed)
 Managed by oncology, upcoming appointment. Currently taking tamoxifen 20mg  daily.

## 2023-08-12 LAB — CBC WITH DIFFERENTIAL/PLATELET
Absolute Lymphocytes: 2897 {cells}/uL (ref 850–3900)
Absolute Monocytes: 653 {cells}/uL (ref 200–950)
Basophils Absolute: 61 {cells}/uL (ref 0–200)
Basophils Relative: 0.7 %
Eosinophils Absolute: 226 {cells}/uL (ref 15–500)
Eosinophils Relative: 2.6 %
HCT: 40.4 % (ref 35.0–45.0)
Hemoglobin: 13.2 g/dL (ref 11.7–15.5)
MCH: 27.5 pg (ref 27.0–33.0)
MCHC: 32.7 g/dL (ref 32.0–36.0)
MCV: 84.2 fL (ref 80.0–100.0)
MPV: 10.2 fL (ref 7.5–12.5)
Monocytes Relative: 7.5 %
Neutro Abs: 4863 {cells}/uL (ref 1500–7800)
Neutrophils Relative %: 55.9 %
Platelets: 400 10*3/uL (ref 140–400)
RBC: 4.8 10*6/uL (ref 3.80–5.10)
RDW: 13.8 % (ref 11.0–15.0)
Total Lymphocyte: 33.3 %
WBC: 8.7 10*3/uL (ref 3.8–10.8)

## 2023-08-12 LAB — MICROALBUMIN / CREATININE URINE RATIO
Creatinine, Urine: 196 mg/dL (ref 20–275)
Microalb Creat Ratio: 10 mg/g{creat} (ref <30)
Microalb, Ur: 1.9 mg/dL

## 2023-08-12 LAB — COMPREHENSIVE METABOLIC PANEL WITH GFR
AG Ratio: 1.6 (calc) (ref 1.0–2.5)
ALT: 29 U/L (ref 6–29)
AST: 22 U/L (ref 10–35)
Albumin: 4.7 g/dL (ref 3.6–5.1)
Alkaline phosphatase (APISO): 44 U/L (ref 37–153)
BUN: 12 mg/dL (ref 7–25)
CO2: 26 mmol/L (ref 20–32)
Calcium: 9.9 mg/dL (ref 8.6–10.4)
Chloride: 104 mmol/L (ref 98–110)
Creat: 0.84 mg/dL (ref 0.50–1.03)
Globulin: 3 g/dL (ref 1.9–3.7)
Glucose, Bld: 93 mg/dL (ref 65–99)
Potassium: 4.3 mmol/L (ref 3.5–5.3)
Sodium: 140 mmol/L (ref 135–146)
Total Bilirubin: 0.3 mg/dL (ref 0.2–1.2)
Total Protein: 7.7 g/dL (ref 6.1–8.1)
eGFR: 84 mL/min/{1.73_m2} (ref >=60)

## 2023-08-12 LAB — LIPID PANEL
Cholesterol: 242 mg/dL — ABNORMAL HIGH (ref <200)
HDL: 61 mg/dL (ref >=50)
LDL Cholesterol (Calc): 159 mg/dL — ABNORMAL HIGH
Non-HDL Cholesterol (Calc): 181 mg/dL — ABNORMAL HIGH (ref <130)
Total CHOL/HDL Ratio: 4 (calc) (ref <5.0)
Triglycerides: 107 mg/dL (ref <150)

## 2023-08-12 LAB — HEMOGLOBIN A1C
Hgb A1c MFr Bld: 6.2 %{Hb} — ABNORMAL HIGH (ref <5.7)
Mean Plasma Glucose: 131 mg/dL
eAG (mmol/L): 7.3 mmol/L

## 2023-08-13 ENCOUNTER — Encounter: Payer: Self-pay | Admitting: Nurse Practitioner

## 2023-08-29 DIAGNOSIS — J301 Allergic rhinitis due to pollen: Secondary | ICD-10-CM | POA: Diagnosis not present

## 2023-08-29 DIAGNOSIS — J3089 Other allergic rhinitis: Secondary | ICD-10-CM | POA: Diagnosis not present

## 2023-08-29 DIAGNOSIS — J3081 Allergic rhinitis due to animal (cat) (dog) hair and dander: Secondary | ICD-10-CM | POA: Diagnosis not present

## 2023-09-04 ENCOUNTER — Ambulatory Visit: Admitting: Psychology

## 2023-09-04 DIAGNOSIS — F4322 Adjustment disorder with anxiety: Secondary | ICD-10-CM

## 2023-09-04 DIAGNOSIS — F4323 Adjustment disorder with mixed anxiety and depressed mood: Secondary | ICD-10-CM | POA: Diagnosis not present

## 2023-09-04 DIAGNOSIS — F4321 Adjustment disorder with depressed mood: Secondary | ICD-10-CM

## 2023-09-04 NOTE — Progress Notes (Signed)
 [facility] Counselor/Therapist Progress Note  Patient ID: Tracey Huff, MRN: [id number],    Date: 09/04/2023  Time Spent: 1:35pm-2:32pm     Treatment Type: Individual Therapy   pt is seen for an in person visit  Reported Symptoms:  pt reported anxiety w/ house closing and recent barriers.  Pt reports otherwise feeling good about how she is doing.    Mental Status Exam: Appearance:  N/a      Behavior: Appropriate  Motor: n/a  Speech/Language:  Clear and Coherent  Affect: Appropriate  Mood: anxious  Thought process: normal  Thought content:   WNL  Sensory/Perceptual disturbances:   WNL  Orientation: oriented to person, place, time/date, and situation  Attention: Good  Concentration: Good  Memory: WNL  Fund of knowledge:  Good  Insight:   Good  Judgment:  Good  Impulse Control: Good   Risk Assessment: Danger to Self:  No Self-injurious Behavior: No Danger to Others: No Duty to Warn:no Physical Aggression / Violence:No  Access to Firearms a concern: No  Gang Involvement:No   Subjective: Counselor assessed pt current functioning per pt report.  Processed w/pt recent stressors.  Explored engagement in positive activities and interactions.  Explored grief and acknowledge emotions.  Discussed relationships, related worries and healthy boundaries.  Pt affect wnl.  Pt reports her closing date has been postponed 2 times for more documentation needed and waiting for funds to be deposited in account.  Pt expressed frustration w/ process but feels moving forward in next week w/ closing on house and beginning move from apartment.  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 Pt reports missing husband and aware of grief but not feeling overwhelming.  Pt reports on exploring relationship- fears for if ready and worry for trust w/ past.   Interventions: Cognitive Behavioral Therapy and supportive  Diagnosis:Adjustment disorder with  anxiety  Grief  Plan: Pt f/u in 2-3 weeks for counseling.  Pt to f/u a scheduled w/ PCP.    Individualized Treatment Plan Strengths: seeking counseling   Supports: daughters, friends    Goal/Needs for Treatment:  In order of importance to patient 1) increase appropriate expression of feelings.   2) cope w/ stressors 3) ---    Client Statement of Needs: "I do things to cover up and not feel.  Creating safe space to talk.  Trying to find myself- for last 4 years I had to take care of him and put my self off. "    Treatment Level: outpt counseling  Symptoms: feeling anxious/on edge, worry, irritability  Client Treatment Preferences:biweekly counseling. Continue med management w/ PCP    Healthcare consumer's goal for treatment:   Counselor, Clydie Darter, [facility] will support the patient's ability to achieve the goals identified. Cognitive Behavioral Therapy, Assertive Communication/Conflict Resolution Training, Relaxation Training, ACT, Humanistic and other evidenced-based practices will be used to promote progress towards healthy functioning.    Healthcare consumer will: Actively participate in therapy, working towards healthy functioning.     *Justification for Continuation/Discontinuation of Goal: R=Revised, O=Ongoing, A=Achieved, D=Discontinued   Goal 1) Increase pt verbal expression of feelings and emotions. Baseline date 09/10/22: Progress towards goal 0; How Often - Daily Target Date Goal Was reviewed Status Code Progress towards goal/Likert rating  09/10/23                            Goal 2) Increase self care and  coping skills daily to manage stress to reduce anxiety and irritability AEB Pt report and therapist observation. Baseline date 09/10/22: Progress towards goal 0; How Often - Daily Target Date Goal Was reviewed Status Code Progress towards goal  09/10/23                            This plan has been reviewed and created by the following participants:  This plan will be  reviewed at least every 12 months. Date Behavioral Health Clinician Date Guardian/Patient   09/10/22          [facility] Murrel Arnt [facility] 09/10/22 Verbal Consent Provided                            Clydie Darter [facility]               [place], [facility]

## 2023-09-05 DIAGNOSIS — J301 Allergic rhinitis due to pollen: Secondary | ICD-10-CM | POA: Diagnosis not present

## 2023-09-05 DIAGNOSIS — J3081 Allergic rhinitis due to animal (cat) (dog) hair and dander: Secondary | ICD-10-CM | POA: Diagnosis not present

## 2023-09-05 DIAGNOSIS — J3089 Other allergic rhinitis: Secondary | ICD-10-CM | POA: Diagnosis not present

## 2023-09-12 DIAGNOSIS — J3081 Allergic rhinitis due to animal (cat) (dog) hair and dander: Secondary | ICD-10-CM | POA: Diagnosis not present

## 2023-09-12 DIAGNOSIS — J3089 Other allergic rhinitis: Secondary | ICD-10-CM | POA: Diagnosis not present

## 2023-09-12 DIAGNOSIS — J301 Allergic rhinitis due to pollen: Secondary | ICD-10-CM | POA: Diagnosis not present

## 2023-09-18 ENCOUNTER — Ambulatory Visit (INDEPENDENT_AMBULATORY_CARE_PROVIDER_SITE_OTHER): Admitting: Psychology

## 2023-09-18 ENCOUNTER — Encounter: Payer: Self-pay | Admitting: Psychology

## 2023-09-18 DIAGNOSIS — F4321 Adjustment disorder with depressed mood: Secondary | ICD-10-CM

## 2023-09-18 DIAGNOSIS — F4323 Adjustment disorder with mixed anxiety and depressed mood: Secondary | ICD-10-CM | POA: Diagnosis not present

## 2023-09-18 DIAGNOSIS — F4322 Adjustment disorder with anxiety: Secondary | ICD-10-CM
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
## 2023-09-26 DIAGNOSIS — J3081 Allergic rhinitis due to animal (cat) (dog) hair and dander: Secondary | ICD-10-CM | POA: Diagnosis not present

## 2023-09-26 DIAGNOSIS — J301 Allergic rhinitis due to pollen: Secondary | ICD-10-CM | POA: Diagnosis not present

## 2023-09-26 DIAGNOSIS — J3089 Other allergic rhinitis: Secondary | ICD-10-CM | POA: Diagnosis not present

## 2023-10-06 ENCOUNTER — Encounter: Payer: Self-pay | Admitting: Nurse Practitioner

## 2023-10-06 ENCOUNTER — Ambulatory Visit (INDEPENDENT_AMBULATORY_CARE_PROVIDER_SITE_OTHER): Admitting: Nurse Practitioner

## 2023-10-06 VITALS — BP 122/74 | HR 87 | Temp 98.3°F | Ht 60.0 in | Wt 159.1 lb

## 2023-10-06 DIAGNOSIS — Z0000 Encounter for general adult medical examination without abnormal findings: Secondary | ICD-10-CM | POA: Diagnosis not present

## 2023-10-06 NOTE — Progress Notes (Signed)
 Name: Tracey Huff   MRN: [id number]    DOB: 08/26/1970   Date:10/06/2023       Progress Note  Subjective  Chief Complaint  Chief Complaint  Patient presents with   Annual Exam    HPI  Patient presents for annual CPE.  Diet: tries to eat well balanced diet Exercise: [employer] and line dancing  Sleep: 7 hours Last dental exam:due, will sechedule Last eye exam: 2 months ago  [employer] Visit from 02/10/2023 in [facility]  AUDIT-C Score 0      Depression: Phq 9 is  negative    10/06/2023    8:49 AM 08/11/2023    9:05 AM 02/10/2023    9:14 AM 01/31/2023   12:11 PM 11/25/2022    8:53 AM  Depression screen PHQ 2/9  Decreased Interest 0 0 0 0 0  Down, Depressed, Hopeless 0 0 0 1 0  PHQ - 2 Score 0 0 0 1 0  Altered sleeping 0 1 1 1  0  Tired, decreased energy 0 0 0 0 0  Change in appetite 0 0 1 1 0  Feeling bad or failure about yourself  0 1 1 1  0  Trouble concentrating 0 0 0 1 0  Moving slowly or fidgety/restless 0 0 0 1 0  Suicidal thoughts 0 0 0 0 0  PHQ-9 Score 0 2 3 6  0  Difficult doing work/chores Not difficult at all Somewhat difficult Somewhat difficult Not difficult at all Not difficult at all   Hypertension: BP Readings from Last 3 Encounters:  10/06/23 122/74  08/11/23 126/82  05/29/23 (!) 314/98   Obesity: Wt Readings from Last 3 Encounters:  10/06/23 159 lb 1.6 oz (72.2 kg)  08/11/23 164 lb 8 oz (74.6 kg)  05/29/23 174 lb 6.4 oz (79.1 kg)   BMI Readings from Last 3 Encounters:  10/06/23 31.07 kg/m  08/11/23 32.13 kg/m  05/29/23 34.06 kg/m     Vaccines:  HPV: up to at age 51 , ask insurance if age between 26-45  Shingrix: 83-64 yo and ask insurance if covered when patient above 12 yo Pneumonia:  educated and discussed with patient. Flu:  educated and discussed with patient.  Hep C Screening: completed STD testing and prevention (HIV/chl/gon/syphilis): completed Intimate partner violence:none Sexual  History : yes Menstrual History/LMP/Abnormal Bleeding: hysterectomy  Incontinence Symptoms: none  Breast cancer:  - Last Mammogram: breast cancer, had double mastectomy  - BRCA gene screening: none  Osteoporosis: Discussed high calcium  and vitamin D supplementation, weight bearing exercises  Cervical cancer screening: hysterectomy   Skin cancer: Discussed monitoring for atypical lesions  Colorectal cancer: 01/04/2022   Lung cancer:   Low Dose CT Chest recommended if Age 43-80 years, 20 pack-year currently smoking OR have quit w/in 15years. Patient does not qualify.   ECG: 03/30/2021  Advanced Care Planning: A voluntary discussion about advance care planning including the explanation and discussion of advance directives.  Discussed health care proxy and Living will, and the patient was able to identify a health care proxy as daughters.  Patient does not have a living will at present time. If patient does have living will, I have requested they bring this to the clinic to be scanned in to their chart.  Lipids: Lab Results  Component Value Date   CHOL 242 (H) 08/11/2023   CHOL 204 (H) 02/10/2023   CHOL 237 (H) 05/10/2022   Lab Results  Component Value Date   HDL  61 08/11/2023   HDL 57 02/10/2023   HDL 61 05/10/2022   Lab Results  Component Value Date   LDLCALC 159 (H) 08/11/2023   LDLCALC 124 (H) 02/10/2023   LDLCALC 153 (H) 05/10/2022   Lab Results  Component Value Date   TRIG 107 08/11/2023   TRIG 124 02/10/2023   TRIG 117 05/10/2022   Lab Results  Component Value Date   CHOLHDL 4.0 08/11/2023   CHOLHDL 3.6 02/10/2023   CHOLHDL 3.9 05/10/2022   Lab Results  Component Value Date   LDLDIRECT 180 (H) 11/08/2020    Glucose: Glucose  Date Value Ref Range Status  11/13/2022 94 70 - 99 mg/dL Final  16/02/9603 540 (H) mg/dL Final    Comment:    98-11 NOTE: New Reference Range  07/12/14   05/12/2011 98 65 - 99 mg/dL Final  91/47/8295 98 65 - 99 mg/dL Final    Glucose, Bld  Date Value Ref Range Status  08/11/2023 93 65 - 99 mg/dL Final    Comment:    .            Fasting reference interval .   02/10/2023 94 65 - 99 mg/dL Final    Comment:    .            Fasting reference interval .   05/10/2022 105 (H) 65 - 99 mg/dL Final    Comment:    .            Fasting reference interval . For someone without known diabetes, a glucose value between 100 and 125 mg/dL is consistent with prediabetes and should be confirmed with a follow-up test. .    Glucose-Capillary  Date Value Ref Range Status  01/04/2022 124 (H) 70 - 99 mg/dL Final    Comment:    Glucose reference range applies only to samples taken after fasting for at least 8 hours.    Patient Active Problem List   Diagnosis Date Noted   Moderate episode of recurrent major depressive disorder (HCC) 01/31/2023   Mild persistent asthma with exacerbation 11/25/2022   Lip rash/swelling 11/13/2022   Lip swelling 11/13/2022   Pruritus 11/13/2022   Urticaria 11/13/2022   Other allergic rhinitis 11/13/2022   Other adverse food reactions, not elsewhere classified, subsequent encounter 11/13/2022   Type 2 diabetes mellitus with hyperglycemia, with long-term current use of insulin (HCC) 07/26/2022   Colon cancer screening    Persistent adjustment disorder with anxiety 12/20/2021   Breast cancer (HCC) 05/15/2021   Genetic testing 05/10/2021   Family history of prostate cancer 04/17/2021   Allergic rhinitis 03/21/2021   Allergic rhinitis due to animal (cat) (dog) hair and dander 03/21/2021   Allergic rhinitis due to pollen 03/21/2021   Chronic allergic conjunctivitis 03/21/2021   Carcinoma of upper-outer quadrant of left breast in female, estrogen receptor positive (HCC) 03/21/2021   High grade squamous intraepithelial cervical dysplasia 01/29/2019   CIN III (cervical intraepithelial neoplasia grade III) with severe dysplasia 08/11/2018   Atypical squamous cell changes of  undetermined significance (ASCUS) on cervical cytology with positive high risk human papilloma virus (HPV) 06/29/2018   Prediabetes 06/24/2018   Class 1 obesity due to excess calories with serious comorbidity and body mass index (BMI) of 33.0 to 33.9 in adult 05/20/2018   Essential hypertension 05/20/2018   Hyperlipidemia 05/20/2018   Asthma 05/20/2018   Anxiety in acute stress reaction 05/20/2018   Status post hysterectomy 11/15/2014    Past Surgical History:  Procedure  Laterality Date   ABDOMINAL HYSTERECTOMY N/A 11/15/2014   Procedure: HYSTERECTOMY ABDOMINAL/BILATERAL SALPINGECTOMY ;  Surgeon: Kris Pester, MD;  Location: [facility];  Service: Gynecology;  Laterality: N/A;   BREAST BIOPSY Right    2019 negative   BREAST BIOPSY Left 03/09/2021   u/s biopsy, 12-1 o'clock, VENUS" clip-path pending   BREAST LUMPECTOMY,RADIO FREQ LOCALIZER,AXILLARY SENTINEL LYMPH NODE BIOPSY Left 03/28/2021   Procedure: BREAST LUMPECTOMY,RADIO FREQ LOCALIZER,AXILLARY SENTINEL LYMPH NODE BIOPSY;  Surgeon: Alben Alma, MD;  Location: [facility];  Service: General;  Laterality: Left;   BREAST RECONSTRUCTION WITH PLACEMENT OF TISSUE EXPANDER AND FLEX HD (ACELLULAR HYDRATED DERMIS) Bilateral 05/15/2021   Procedure: BILATERAL BREAST RECONSTRUCTION WITH PLACEMENT OF TISSUE EXPANDER AND FLEX HD (ACELLULAR HYDRATED DERMIS);  Surgeon: Barb Bonito, MD;  Location: [facility];  Service: Plastics;  Laterality: Bilateral;   CESAREAN SECTION  1990   COLONOSCOPY WITH PROPOFOL  N/A 01/04/2022   Procedure: COLONOSCOPY WITH PROPOFOL ;  Surgeon: Luke Salaam, MD;  Location: [facility];  Service: Gastroenterology;  Laterality: N/A;   COLPOSCOPY  07/28/2018   CYSTOSCOPY N/A 11/15/2014   Procedure: CYSTOSCOPY;  Surgeon: Kris Pester, MD;  Location: [facility];  Service: Gynecology;  Laterality: N/A;   DILATION AND CURETTAGE OF UTERUS     GANGLION CYST EXCISION Left    REMOVAL OF BILATERAL TISSUE EXPANDERS WITH PLACEMENT  OF BILATERAL BREAST IMPLANTS Bilateral 10/02/2021   Procedure: REMOVAL OF BILATERAL TISSUE EXPANDERS WITH PLACEMENT OF BILATERAL BREAST IMPLANTS;  Surgeon: Barb Bonito, MD;  Location: [facility];  Service: Plastics;  Laterality: Bilateral;   TOTAL MASTECTOMY Bilateral 05/15/2021   Procedure: TOTAL MASTECTOMY, Bilateral Simple Mastectomy, skin sparing;  Surgeon: Alben Alma, MD;  Location: [facility];  Service: General;  Laterality: Bilateral;  Provider requesting for 2 hours / 120 min for procedure.   UTERINE FIBROID SURGERY      Family History  Problem Relation Age of Onset   Allergic rhinitis Mother    Congestive Heart Failure Mother    Diabetes Mother    Hypertension Mother    Diabetes Maternal Grandmother    Congestive Heart Failure Maternal Grandfather    Breast cancer Neg Hx     Social History   Socioeconomic History   Marital status: Married    Spouse name: Hewitt Lou   Number of children: 2   Years of education: Not on file   Highest education level: Not on file  Occupational History   Not on file  Tobacco Use   Smoking status: Never    Passive exposure: Never   Smokeless tobacco: Never  Vaping Use   Vaping status: Never Used  Substance and Sexual Activity   Alcohol use: Not Currently    Comment: occassional   Drug use: No   Sexual activity: Yes    Partners: Male    Birth control/protection: Surgical  Other Topics Concern   Not on file  Social History Narrative   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 Lives with husband.    Social Drivers of Corporate investment banker Strain: Low Risk  (08/09/2022)   Overall Financial Resource Strain (CARDIA)    Difficulty of Paying Living Expenses: Not hard at all  Food Insecurity: No Food Insecurity (08/09/2022)   Hunger Vital Sign    Worried About Running Out of Food in the Last Year: Never true    Ran Out of Food in the Last Year: Never  true  Transportation  Needs: No Transportation Needs (08/09/2022)   PRAPARE - Administrator, Civil Service (Medical): No    Lack of Transportation (Non-Medical): No  Physical Activity: Sufficiently Active (08/04/2023)   Received from [facility]   Exercise Vital Sign    Days of Exercise per Week: 6 days    Minutes of Exercise per Session: 30 min  Stress: No Stress Concern Present (08/09/2022)   [employer] of Occupational Health - Occupational Stress Questionnaire    Feeling of Stress : Only a little  Social Connections: Socially Integrated (08/09/2022)   Social Connection and Isolation Panel [NHANES]    Frequency of Communication with Friends and Family: More than three times a week    Frequency of Social Gatherings with Friends and Family: More than three times a week    Attends Religious Services: More than 4 times per year    Active Member of [employer] or Organizations: No    Attends Engineer, structural: More than 4 times per year    Marital Status: Married  Catering manager Violence: Not At Risk (08/09/2022)   Humiliation, Afraid, Rape, and Kick questionnaire    Fear of Current or Ex-Partner: No    Emotionally Abused: No    Physically Abused: No    Sexually Abused: No     Current Outpatient Medications:    albuterol  (PROVENTIL  HFA) 108 (90 Base) MCG/ACT inhaler, Inhale 1-2 puffs into the lungs every 4-6 hours as needed for cough/wheeze, Disp: 6.7 g, Rfl: 0   ALPRAZolam  (XANAX ) 0.25 MG tablet, Take 1 tablet (0.25 mg total) by mouth 2 (two) times daily as needed for anxiety (severe anxiety)., Disp: 20 tablet, Rfl: 0   Ascorbic Acid (VITAMIN C) 100 MG tablet, Take 100 mg by mouth daily., Disp: , Rfl:    atorvastatin  (LIPITOR) 10 MG tablet, Take 1 tablet (10 mg total) by mouth daily at bedtime., Disp: 90 tablet, Rfl: 1   azelastine  (ASTELIN ) 0.1 % nasal spray, Place 1-2 sprays into both nostrils 2 (two) times daily., Disp: 30 mL, Rfl: 6   busPIRone  (BUSPAR ) 10 MG tablet,  Take 1 tablet (10 mg total) by mouth 2 (two) times daily as needed (anxiety)., Disp: 120 tablet, Rfl: 1   diphenhydrAMINE  (BENADRYL ) 2 % cream, Apply 1 application topically 2 (two) times daily as needed for itching., Disp: , Rfl:    ELDERBERRY PO, Take 1 capsule by mouth daily., Disp: , Rfl:    EPINEPHrine  0.3 mg/0.3 mL IJ SOAJ injection, Inject 0.3 mg into the muscle as needed, as directed., Disp: 2 each, Rfl: 1   ezetimibe  (ZETIA ) 10 MG tablet, Take 1 tablet (10 mg total) by mouth daily., Disp: 90 tablet, Rfl: 3   fluticasone  (FLONASE ) 50 MCG/ACT nasal spray, Place 1-2 sprays into both nostrils once daily., Disp: 16 g, Rfl: 6   Fluticasone -Umeclidin-Vilant (TRELEGY ELLIPTA ) 200-62.5-25 MCG/ACT AEPB, Inhale 1 puff into the lungs daily., Disp: 60 each, Rfl: 6   hydrocortisone cream 1 %, Apply 1 application topically daily as needed for itching., Disp: , Rfl:    hydrOXYzine  (VISTARIL ) 25 MG capsule, TAKE 1 CAPSULE (25 MG TOTAL) BY MOUTH AT BEDTIME AS NEEDED FOR UP TO 30 DOSES (DIFFICULTY SLEEPING)., Disp: 30 capsule, Rfl: 1   ipratropium-albuterol  (DUONEB) 0.5-2.5 (3) MG/3ML SOLN, Take 3 mLs by nebulization every 6 (six) hours as needed (wheeze, SOB, cough variant asthma)., Disp: 360 mL, Rfl: 1   levocetirizine (XYZAL ) 5 MG tablet, Take 1 tablet (  5 mg total) by mouth every evening., Disp: 30 tablet, Rfl: 6   losartan  (COZAAR ) 25 MG tablet, Take 1 tablet (25 mg total) by mouth every morning., Disp: 90 tablet, Rfl: 1   montelukast  (SINGULAIR ) 10 MG tablet, Take 1 tablet (10 mg total) by mouth every evening., Disp: 30 tablet, Rfl: 6   mupirocin  ointment (BACTROBAN ) 2 %, Apply 1 Application topically 2 (two) times daily., Disp: 22 g, Rfl: 0   Olopatadine  HCl (PATADAY ) 0.2 % SOLN, Instill 1 drop into affected eye once daily, Disp: 2.5 mL, Rfl: 6   Ruxolitinib  Phosphate (OPZELURA ) 1.5 % CREA, Apply 1 application topically as directed 1- 2  times daily., Disp: 60 g, Rfl: 11   Semaglutide ,0.25 or 0.5MG /DOS,  (OZEMPIC , 0.25 OR 0.5 MG/DOSE,) 2 MG/3ML SOPN, Inject 0.5 mg into the skin once a week., Disp: 9 mL, Rfl: 1   tamoxifen  (NOLVADEX ) 20 MG tablet, Take 1 tablet (20 mg total) by mouth daily., Disp: 90 tablet, Rfl: 1   venlafaxine  XR (EFFEXOR  XR) 150 MG 24 hr capsule, Take 1 capsule (150 mg total) by mouth daily with breakfast., Disp: 90 capsule, Rfl: 1   zolpidem  (AMBIEN ) 5 MG tablet, Take 1 tablet (5 mg total) by mouth at bedtime as needed for sleep., Disp: 90 tablet, Rfl: 1   Docosanol  10 % CREA, Apply 1 Application topically every 4 (four) hours. (Patient not taking: Reported on 10/06/2023), Disp: 2 g, Rfl: 0   meloxicam  (MOBIC ) 15 MG tablet, Take 1 tablet (15 mg total) by mouth daily. (Patient not taking: Reported on 10/06/2023), Disp: 30 tablet, Rfl: 2  Allergies  Allergen Reactions   Macadamia Nut Oil Shortness Of Breath   Apple Juice Nausea And Vomiting   Estonia Nut (Berthollefia Excelsa) Nausea And Vomiting and Swelling    Tongue swelling   Carrot Oil Nausea And Vomiting    Can not cooked raw carrots   Fruit & Vegetable Daily [Nutritional Supplements] Nausea And Vomiting    Cannot tolerate apples, peaches, plums, and nectarines   Kiwi Extract Nausea And Vomiting   Other     Lobster   Peach [Prunus Persica] Nausea And Vomiting     ROS  Constitutional: Negative for fever or weight change.  Respiratory: Negative for cough and shortness of breath.   Cardiovascular: Negative for chest pain or palpitations.  Gastrointestinal: Negative for abdominal pain, no bowel changes.  Musculoskeletal: Negative for gait problem or joint swelling.  Skin: Negative for rash.  Neurological: Negative for dizziness or headache.  No other specific complaints in a complete review of systems (except as listed in HPI above).   Objective  Vitals:   10/06/23 0837  BP: 122/74  Pulse: 87  Temp: 98.3 F (36.8 C)  TempSrc: Oral  SpO2: 100%  Weight: 159 lb 1.6 oz (72.2 kg)  Height: 5' (1.524 m)     Body mass index is 31.07 kg/m.  Physical Exam Vitals reviewed.  Constitutional:      Appearance: Normal appearance.  HENT:     Head: Normocephalic.     Right Ear: Tympanic membrane normal.     Left Ear: Tympanic membrane normal.     Nose: Nose normal.  Eyes:     Extraocular Movements: Extraocular movements intact.     Conjunctiva/sclera: Conjunctivae normal.     Pupils: Pupils are equal, round, and reactive to light.  Neck:     Thyroid : No thyroid  mass, thyromegaly or thyroid  tenderness.  Cardiovascular:     Rate and Rhythm:  Normal rate and regular rhythm.     Pulses: Normal pulses.     Heart sounds: Normal heart sounds.  Pulmonary:     Effort: Pulmonary effort is normal.     Breath sounds: Normal breath sounds.  Abdominal:     General: Bowel sounds are normal.     Palpations: Abdomen is soft.  Musculoskeletal:        General: Normal range of motion.     Cervical back: Normal range of motion and neck supple.     Right lower leg: No edema.     Left lower leg: No edema.  Skin:    General: Skin is warm and dry.     Capillary Refill: Capillary refill takes less than 2 seconds.  Neurological:     General: No focal deficit present.     Mental Status: She is alert and oriented to person, place, and time. Mental status is at baseline.  Psychiatric:        Mood and Affect: Mood normal.        Behavior: Behavior normal.        Thought Content: Thought content normal.        Judgment: Judgment normal.      Recent Results (from the past 2160 hours)  Lipid panel     Status: Abnormal   Collection Time: 08/11/23 10:01 AM  Result Value Ref Range   Cholesterol 242 (H) <200 mg/dL   HDL 61 > OR = 50 mg/dL   Triglycerides 244 <010 mg/dL   LDL Cholesterol (Calc) 159 (H) mg/dL (calc)    Comment: Reference range: <100 . Desirable range <100 mg/dL for primary prevention;   <70 mg/dL for patients with CHD or diabetic patients  with > or = 2 CHD risk factors. Aaron Aas LDL-C is now  calculated using the Martin-Hopkins  calculation, which is a validated novel method providing  better accuracy than the Friedewald equation in the  estimation of LDL-C.  Melinda Sprawls et al. Erroll Heard. 2725;366(44): 2061-2068  (http://education.[facility].com/faq/FAQ164)    Total CHOL/HDL Ratio 4.0 <5.0 (calc)   Non-HDL Cholesterol (Calc) 181 (H) <130 mg/dL (calc)    Comment: For patients with diabetes plus 1 major ASCVD risk  factor, treating to a non-HDL-C goal of <100 mg/dL  (LDL-C of <03 mg/dL) is considered a therapeutic  option.   Comprehensive metabolic panel with GFR     Status: None   Collection Time: 08/11/23 10:01 AM  Result Value Ref Range   Glucose, Bld 93 65 - 99 mg/dL    Comment: .            Fasting reference interval .    BUN 12 7 - 25 mg/dL   Creat 4.74 2.59 - 5.63 mg/dL   eGFR 84 > OR = 60 OV/FIE/3.32R5   BUN/Creatinine Ratio SEE NOTE: 6 - 22 (calc)    Comment:    Not Reported: BUN and Creatinine are within    reference range. .    Sodium 140 135 - 146 mmol/L   Potassium 4.3 3.5 - 5.3 mmol/L   Chloride 104 98 - 110 mmol/L   CO2 26 20 - 32 mmol/L   Calcium  9.9 8.6 - 10.4 mg/dL   Total Protein 7.7 6.1 - 8.1 g/dL   Albumin 4.7 3.6 - 5.1 g/dL   Globulin 3.0 1.9 - 3.7 g/dL (calc)   AG Ratio 1.6 1.0 - 2.5 (calc)   Total Bilirubin 0.3 0.2 - 1.2 mg/dL   Alkaline phosphatase (APISO)  44 37 - 153 U/L   AST 22 10 - 35 U/L   ALT 29 6 - 29 U/L  CBC with Differential/Platelet     Status: None   Collection Time: 08/11/23 10:01 AM  Result Value Ref Range   WBC 8.7 3.8 - 10.8 Thousand/uL   RBC 4.80 3.80 - 5.10 Million/uL   Hemoglobin 13.2 11.7 - 15.5 g/dL   HCT 65.7 84.6 - 96.2 %   MCV 84.2 80.0 - 100.0 fL   MCH 27.5 27.0 - 33.0 pg   MCHC 32.7 32.0 - 36.0 g/dL    Comment: For adults, a slight decrease in the calculated MCHC value (in the range of 30 to 32 g/dL) is most likely not clinically significant; however, it should be interpreted with caution in  correlation with other red cell parameters and the patient's clinical condition.    RDW 13.8 11.0 - 15.0 %   Platelets 400 140 - 400 Thousand/uL   MPV 10.2 7.5 - 12.5 fL   Neutro Abs 4,863 1,500 - 7,800 cells/uL   Absolute Lymphocytes 2,897 850 - 3,900 cells/uL   Absolute Monocytes 653 200 - 950 cells/uL   Eosinophils Absolute 226 15 - 500 cells/uL   Basophils Absolute 61 0 - 200 cells/uL   Neutrophils Relative % 55.9 %   Total Lymphocyte 33.3 %   Monocytes Relative 7.5 %   Eosinophils Relative 2.6 %   Basophils Relative 0.7 %  Hemoglobin A1c     Status: Abnormal   Collection Time: 08/11/23 10:01 AM  Result Value Ref Range   Hgb A1c MFr Bld 6.2 (H) <5.7 % of total Hgb    Comment: For someone without known diabetes, a hemoglobin  A1c value between 5.7% and 6.4% is consistent with prediabetes and should be confirmed with a  follow-up test. . For someone with known diabetes, a value <7% indicates that their diabetes is well controlled. A1c targets should be individualized based on duration of diabetes, age, comorbid conditions, and other considerations. . This assay result is consistent with an increased risk of diabetes. . Currently, no consensus exists regarding use of hemoglobin A1c for diagnosis of diabetes for children. .    Mean Plasma Glucose 131 mg/dL   eAG (mmol/L) 7.3 mmol/L  Microalbumin / creatinine urine ratio     Status: None   Collection Time: 08/11/23 10:01 AM  Result Value Ref Range   Creatinine, Urine 196 20 - 275 mg/dL   Microalb, Ur 1.9 mg/dL    Comment: Reference Range Not established    Microalb Creat Ratio 10 <30 mg/g creat    Comment: . The ADA defines abnormalities in albumin excretion as follows: Aaron Aas Albuminuria Category        Result (mg/g creatinine) . Normal to Mildly increased   <30 Moderately increased         30-299  Severely increased           > OR = 300 . The ADA recommends that at least two of three specimens collected within  a 3-6 month period be abnormal before considering a patient to be within a diagnostic category.       Fall Risk:    10/06/2023    8:49 AM 08/11/2023    8:52 AM 02/10/2023    9:10 AM 01/31/2023   12:11 PM 11/25/2022    8:53 AM  Fall Risk   Falls in the past year? 0 0 0 0 0  Number falls in past yr: 0  0 0 0 0  Injury with Fall? 0 0 0 0 0  Risk for fall due to :   No Fall Risks No Fall Risks   Follow up  Falls evaluation completed Falls prevention discussed Falls prevention discussed      Functional Status Survey: Is the patient deaf or have difficulty hearing?: No Does the patient have difficulty seeing, even when wearing glasses/contacts?: No Does the patient have difficulty concentrating, remembering, or making decisions?: No Does the patient have difficulty walking or climbing stairs?: No Does the patient have difficulty dressing or bathing?: No Does the patient have difficulty doing errands alone such as visiting a doctor's office or shopping?: No   Assessment & Plan  1. Annual physical exam (Primary) -up to date on labs, and health maintenance -- Encourage continuation of lifestyle modifications, including dietary management and regular exercise. -continue to increase physical activity, getting at least 150 min of physical activity a week.  Work on including Runner, broadcasting/film/video 2 days a week.  - continue eating at a calorie deficit 1600-1700 cal a day, eating a well balanced diet with whole foods, avoiding processed foods.     -USPSTF grade A and B recommendations reviewed with patient; age-appropriate recommendations, preventive care, screening tests, etc discussed and encouraged; healthy living encouraged; see AVS for patient education given to patient -Discussed importance of 150 minutes of physical activity weekly, eat two servings of fish weekly, eat one serving of tree nuts ( cashews, pistachios, pecans, almonds.Aaron Aas) every other day, eat 6 servings of fruit/vegetables daily  and drink plenty of water  and avoid sweet beverages.   -Reviewed Health Maintenance: yes

## 2023-10-09 ENCOUNTER — Ambulatory Visit: Admitting: Psychology

## 2023-10-09 DIAGNOSIS — F4323 Adjustment disorder with mixed anxiety and depressed mood: Secondary | ICD-10-CM

## 2023-10-09 DIAGNOSIS — F4322 Adjustment disorder with anxiety: Secondary | ICD-10-CM

## 2023-10-09 DIAGNOSIS — F4321 Adjustment disorder with depressed mood: Secondary | ICD-10-CM

## 2023-10-09 NOTE — Progress Notes (Signed)
 [facility] Counselor/Therapist Progress Note  Patient ID: Tracey Huff, MRN: [id number],    Date: 10/09/2023  Time Spent: 1:33pm-2:29pm     Treatment Type: Individual Therapy   pt is seen for an in person visit  Reported Symptoms:  pt reported feeling positive about transitions.  Pt reports some night difficulty sleeping.      Mental Status Exam: Appearance:  N/a     Behavior: Appropriate  Motor: n/a  Speech/Language:  Clear and Coherent  Affect: Appropriate  Mood: normal  Thought process: normal  Thought content:   WNL  Sensory/Perceptual disturbances:   WNL  Orientation: oriented to person, place, time/date, and situation  Attention: Good  Concentration: Good  Memory: WNL  Fund of knowledge:  Good  Insight:   Good  Judgment:  Good  Impulse Control: Good   Risk Assessment: Danger to Self:  No Self-injurious Behavior: No Danger to Others: No Duty to Warn:no Physical Aggression / Violence:No  Access to Firearms a concern: No  Gang Involvement:No   Subjective: Counselor assessed pt current functioning per pt report.  Processed w/pt transition w/ move and purchase of new home. Discussed metaphors pt use for close doors to past and starting new.  Encouraged continued healthy boundaries in developing relationships.  Pt affect wnl.  Pt reports she has moved into her new home.  Pt reports she has been busy w/ work and getting new home set up.  Pt shared video posted to social media in which she was closing doors to past and starting new.  Pt discussed how she feels good about growth faced during husband's illness and caregiving for him.  Pt reports some developing relationships that have been inconsistent and keeping distance and not putting energy towards.    Interventions: Cognitive Behavioral Therapy and supportive  Diagnosis:Adjustment disorder with anxiety  Grief  Plan: Pt f/u in 2-3 weeks for counseling.  Pt to f/u a scheduled w/ PCP.        Individualized Treatment Plan Strengths: writing her own book about her experience,  moving into home bought, her faith, enjoys shopping, joined a dance team  Supports: police friends, daughters, friends, spiritual mentors    Goal/Needs for Treatment:  In order of importance to patient 1) increase appropriate expression of feelings.   2) cope w/ stressors 3) ---    Client Statement of Needs: " I still need to work towards finding me, I can still question myself and what I am doing and want to be able to accept myself.  Continue to creat safe space to talk."     Treatment Level: outpt counseling  Symptoms: feeling anxious/on edge, grief  Client Treatment Preferences:biweekly counseling. Continue med management w/ PCP    Healthcare consumer's goal for treatment:   Counselor, Tracey Huff, Tracey Huff will support the patient's ability to achieve the goals identified. Cognitive Behavioral Therapy, Assertive Communication/Conflict Resolution Training, Relaxation Training, ACT, Humanistic and other evidenced-based practices will be used to promote progress towards healthy functioning.    Healthcare consumer will: Actively participate in therapy, working towards healthy functioning.     *Justification for Continuation/Discontinuation of Goal: R=Revised, O=Ongoing, A=Achieved, D=Discontinued   Goal 1) Increase pt verbal expression of feelings, grief and emotions. Baseline date 09/18/23: Progress towards goal 50; How Often - Daily Target Date Goal Was reviewed Status Code Progress towards goal/Likert rating  09/17/24  Goal 2) Increase self care and coping skills daily to manage stress to reduce anxiety and irritability AEB Pt report and therapist observation. Baseline date 09/18/23: Progress towards goal 50; How Often - Daily Target Date Goal Was reviewed Status Code Progress towards goal  09/17/24                            Goal 3) Increase self discovery,  awareness of self, values and goals AEB Pt report and therapist observation. Baseline date 09/18/23: Progress towards goal 25; How Often - Daily Target Date Goal Was reviewed Status Code Progress towards goal  09/17/24                            This plan has been reviewed and created by the following participants:  This plan will be reviewed at least every 12 months. Date Behavioral Health Clinician Date Guardian/Patient   09/18/23          Tracey Huff Tracey Huff [facility] 09/18/23 Verbal Consent Provided and electronic signature requested       10/07/23  Electronic signature completed by pt                 [place], LCMHC

## 2023-10-23 ENCOUNTER — Ambulatory Visit (INDEPENDENT_AMBULATORY_CARE_PROVIDER_SITE_OTHER): Admitting: Psychology

## 2023-10-23 DIAGNOSIS — F4323 Adjustment disorder with mixed anxiety and depressed mood: Secondary | ICD-10-CM | POA: Diagnosis not present

## 2023-10-23 DIAGNOSIS — F4322 Adjustment disorder with anxiety: Secondary | ICD-10-CM

## 2023-10-23 DIAGNOSIS — F4321 Adjustment disorder with depressed mood: Secondary | ICD-10-CM

## 2023-10-23 NOTE — Progress Notes (Signed)
 [facility] Counselor/Therapist Progress Note  Patient ID: Tracey Huff, MRN: [id number],    Date: 10/23/2023  Time Spent: 2:37pm-3:28pm     Treatment Type: Individual Therapy   pt is seen for an in person visit  Reported Symptoms:  pt reported feeling of sadness and grief.  Pt reports sleep improved.  Pt reports engaging w/ dance group.        Mental Status Exam: Appearance:  N/a     Behavior: Appropriate  Motor: n/a  Speech/Language:  Clear and Coherent  Affect: Appropriate  Mood: sad  Thought process: normal  Thought content:   WNL  Sensory/Perceptual disturbances:   WNL  Orientation: oriented to person, place, time/date, and situation  Attention: Good  Concentration: Good  Memory: WNL  Fund of knowledge:  Good  Insight:   Good  Judgment:  Good  Impulse Control: Good   Risk Assessment: Danger to Self:  No Self-injurious Behavior: No Danger to Others: No Duty to Warn:no Physical Aggression / Violence:No  Access to Firearms a concern: No  Gang Involvement:No   Subjective: Counselor assessed pt current functioning per pt report.  Processed w/ pt recent emotions and grief.  Validated and normalized grief process and varying intensity of emotions.  Explored relationship that has been inconsistent and encouraged to keep healthy boundaries w/.  Encouraged space to acknowledge emotions and engaging w/ positive activities/supports.  Pt affect wnl.  Pt reports that father's day weekend felt increased sadness, tearfulness and thoughts of husband.  Pt able to validate her feelings and also recognize at times pushes emotions away.  Pt discussed continued inconsistent in relationship and recognize need for boundaries in.  Pt reports that she is connecting w/ dance group again and been consistent w/.  Pt discussed how beneficial for her to be consistent w/ this.  Interventions: Cognitive Behavioral Therapy and supportive  Diagnosis:Adjustment disorder with  anxiety  Grief  Plan: Pt f/u in 2-3 weeks for counseling.  Pt to f/u a scheduled w/ PCP.       Individualized Treatment Plan Strengths: writing her own book about her experience,  moving into home bought, her faith, enjoys shopping, joined a dance team  Supports: police friends, daughters, friends, spiritual mentors    Goal/Needs for Treatment:  In order of importance to patient 1) increase appropriate expression of feelings.   2) cope w/ stressors 3) ---    Client Statement of Needs:  I still need to work towards finding me, I can still question myself and what I am doing and want to be able to accept myself.  Continue to creat safe space to talk.     Treatment Level: outpt counseling  Symptoms: feeling anxious/on edge, grief  Client Treatment Preferences:biweekly counseling. Continue med management w/ PCP    Healthcare consumer's goal for treatment:   Counselor, Clydie Darter, [facility] will support the patient's ability to achieve the goals identified. Cognitive Behavioral Therapy, Assertive Communication/Conflict Resolution Training, Relaxation Training, ACT, Humanistic and other evidenced-based practices will be used to promote progress towards healthy functioning.    Healthcare consumer will: Actively participate in therapy, working towards healthy functioning.     *Justification for Continuation/Discontinuation of Goal: R=Revised, O=Ongoing, A=Achieved, D=Discontinued   Goal 1) Increase pt verbal expression of feelings, grief and emotions. Baseline date 09/18/23: Progress towards goal 50; How Often - Daily Target Date Goal Was reviewed Status Code Progress towards goal/Likert rating  09/17/24  Goal 2) Increase self care and coping skills daily to manage stress to reduce anxiety and irritability AEB Pt report and therapist observation. Baseline date 09/18/23: Progress towards goal 50; How Often - Daily Target Date Goal Was reviewed Status Code  Progress towards goal  09/17/24                            Goal 3) Increase self discovery, awareness of self, values and goals AEB Pt report and therapist observation. Baseline date 09/18/23: Progress towards goal 25; How Often - Daily Target Date Goal Was reviewed Status Code Progress towards goal  09/17/24                            This plan has been reviewed and created by the following participants:  This plan will be reviewed at least every 12 months. Date Behavioral Health Clinician Date Guardian/Patient   09/18/23          [facility] Murrel Arnt [facility] 09/18/23 Verbal Consent Provided and electronic signature requested       10/07/23  Electronic signature completed by pt               [place], LCMHC

## 2023-10-24 DIAGNOSIS — J301 Allergic rhinitis due to pollen: Secondary | ICD-10-CM | POA: Diagnosis not present

## 2023-10-24 DIAGNOSIS — J3081 Allergic rhinitis due to animal (cat) (dog) hair and dander: Secondary | ICD-10-CM | POA: Diagnosis not present

## 2023-10-24 DIAGNOSIS — J3089 Other allergic rhinitis: Secondary | ICD-10-CM | POA: Diagnosis not present

## 2023-10-31 DIAGNOSIS — J3081 Allergic rhinitis due to animal (cat) (dog) hair and dander: Secondary | ICD-10-CM | POA: Diagnosis not present

## 2023-10-31 DIAGNOSIS — J301 Allergic rhinitis due to pollen: Secondary | ICD-10-CM | POA: Diagnosis not present

## 2023-10-31 DIAGNOSIS — J3089 Other allergic rhinitis: Secondary | ICD-10-CM | POA: Diagnosis not present

## 2023-11-06 DIAGNOSIS — J3081 Allergic rhinitis due to animal (cat) (dog) hair and dander: Secondary | ICD-10-CM | POA: Diagnosis not present

## 2023-11-06 DIAGNOSIS — J301 Allergic rhinitis due to pollen: Secondary | ICD-10-CM | POA: Diagnosis not present

## 2023-11-06 DIAGNOSIS — J3089 Other allergic rhinitis: Secondary | ICD-10-CM | POA: Diagnosis not present

## 2023-11-07 ENCOUNTER — Other Ambulatory Visit: Payer: Self-pay | Admitting: Nurse Practitioner

## 2023-11-07 DIAGNOSIS — Z79899 Other long term (current) drug therapy: Secondary | ICD-10-CM

## 2023-11-07 DIAGNOSIS — I10 Essential (primary) hypertension: Secondary | ICD-10-CM

## 2023-11-10 ENCOUNTER — Other Ambulatory Visit: Payer: Self-pay | Admitting: Nurse Practitioner

## 2023-11-10 DIAGNOSIS — E785 Hyperlipidemia, unspecified: Secondary | ICD-10-CM

## 2023-11-10 NOTE — Telephone Encounter (Unsigned)
 Copied from CRM [id number]. Topic: Clinical - Medication Refill >> Nov 10, 2023  3:26 PM Delon T wrote: Medication: atorvastatin  (LIPITOR) 10 MG tablet   Has the patient contacted their pharmacy? Yes (Agent: If no, request that the patient contact the pharmacy for the refill. If patient does not wish to contact the pharmacy document the reason why and proceed with request.) (Agent: If yes, when and what did the pharmacy advise?)  This is the patient's preferred pharmacy:  [facility] [address] [place] [address] Phone: [phone] Fax: [phone]    Is this the correct pharmacy for this prescription? Yes If no, delete pharmacy and type the correct one.   Has the prescription been filled recently? Yes  Is the patient out of the medication? Yes  Has the patient been seen for an appointment in the last year OR does the patient have an upcoming appointment? Yes  Can we respond through MyChart? Yes  Agent: Please be advised that Rx refills may take up to 3 business days. We ask that you follow-up with your pharmacy.

## 2023-11-11 ENCOUNTER — Other Ambulatory Visit (HOSPITAL_COMMUNITY): Payer: Self-pay

## 2023-11-11 ENCOUNTER — Other Ambulatory Visit: Payer: Self-pay

## 2023-11-11 ENCOUNTER — Other Ambulatory Visit: Payer: Self-pay | Admitting: Nurse Practitioner

## 2023-11-11 ENCOUNTER — Encounter: Payer: Self-pay | Admitting: Nurse Practitioner

## 2023-11-11 DIAGNOSIS — Z79899 Other long term (current) drug therapy: Secondary | ICD-10-CM

## 2023-11-11 DIAGNOSIS — E785 Hyperlipidemia, unspecified: Secondary | ICD-10-CM

## 2023-11-11 DIAGNOSIS — I10 Essential (primary) hypertension: Secondary | ICD-10-CM

## 2023-11-11 MED ORDER — LOSARTAN POTASSIUM 25 MG PO TABS
25.0000 mg | ORAL_TABLET | ORAL | 1 refills | Status: DC
  Filled 2023-11-11: qty 90, 90d supply, fill #0
  Filled 2024-02-09: qty 90, 90d supply, fill #1

## 2023-11-11 MED ORDER — ATORVASTATIN CALCIUM 10 MG PO TABS
10.0000 mg | ORAL_TABLET | Freq: Every day | ORAL | 1 refills | Status: AC
  Filled 2023-11-11: qty 90, 90d supply, fill #0

## 2023-11-12 NOTE — Telephone Encounter (Signed)
 Disp Refills Start End   atorvastatin  (LIPITOR) 10 MG tablet 90 tablet 1 11/11/2023 --   Sig - Route: Take 1 tablet (10 mg total) by mouth daily at bedtime. - Oral   Sent to pharmacy as: atorvastatin  (LIPITOR) 10 MG tablet     Requested Prescriptions  Pending Prescriptions Disp Refills   atorvastatin  (LIPITOR) 10 MG tablet 90 tablet 1    Sig: Take 1 tablet (10 mg total) by mouth daily at bedtime.     Cardiovascular:  Antilipid - Statins Failed - 11/12/2023  3:20 PM      Failed - Lipid Panel in normal range within the last 12 months    Cholesterol  Date Value Ref Range Status  08/11/2023 242 (H) <200 mg/dL Final   LDL Cholesterol (Calc)  Date Value Ref Range Status  08/11/2023 159 (H) mg/dL (calc) Final    Comment:    Reference range: <100 . Desirable range <100 mg/dL for primary prevention;   <70 mg/dL for patients with CHD or diabetic patients  with > or = 2 CHD risk factors. [facility] LDL-C is now calculated using the Martin-Hopkins  calculation, which is a validated novel method providing  better accuracy than the Friedewald equation in the  estimation of LDL-C.  Tracey Huff et al. Tracey Huff. 7986;689(80): 2061-2068  (http://education.[employer].com/faq/FAQ164)    Direct LDL  Date Value Ref Range Status  11/08/2020 180 (H) <100 mg/dL Final    Comment:    Greatly elevated Triglycerides values (>1200 mg/dL) interfere with the dLDL assay. As no Triglycerides  testing was ordered, interpret results with caution. . Desirable range <100 mg/dL for primary prevention;   <70 mg/dL for patients with CHD or diabetic patients  with > or = 2 CHD risk factors. [facility]    HDL  Date Value Ref Range Status  08/11/2023 61 > OR = 50 mg/dL Final   Triglycerides  Date Value Ref Range Status  08/11/2023 107 <150 mg/dL Final         Passed - Patient is not pregnant      Passed - Valid encounter within last 12 months    Recent Outpatient Visits           1 month ago Annual physical exam    [facility] Gareth Mliss FALCON, FNP   3 months ago Immunization due   [facility] Gareth Mliss FALCON, FNP       Future Appointments             In 4 months Hester Alm BROCKS, MD [facility]   In 10 months Gareth, Mliss FALCON, FNP [facility]

## 2023-11-13 ENCOUNTER — Ambulatory Visit: Admitting: Psychology

## 2023-11-13 DIAGNOSIS — F4323 Adjustment disorder with mixed anxiety and depressed mood: Secondary | ICD-10-CM | POA: Diagnosis not present

## 2023-11-13 DIAGNOSIS — F4321 Adjustment disorder with depressed mood: Secondary | ICD-10-CM

## 2023-11-13 DIAGNOSIS — F4322 Adjustment disorder with anxiety: Secondary | ICD-10-CM

## 2023-11-13 NOTE — Progress Notes (Signed)
 [facility] Counselor/Therapist Progress Note  Patient ID: Tracey Huff, MRN: [id number],    Date: 11/13/2023  Time Spent: 2:37pm-3:20pm     Treatment Type: Individual Therapy   pt is seen for an in person visit  Reported Symptoms:  pt reported setting boundaries for self care. Pt reports positives w/ working towards personal projects.    Mental Status Exam: Appearance:  N/a     Behavior: Appropriate  Motor: n/a  Speech/Language:  Clear and Coherent  Affect: Appropriate  Mood: sad  Thought process: normal  Thought content:   WNL  Sensory/Perceptual disturbances:   WNL  Orientation: oriented to person, place, time/date, and situation  Attention: Good  Concentration: Good  Memory: WNL  Fund of knowledge:  Good  Insight:   Good  Judgment:  Good  Impulse Control: Good   Risk Assessment: Danger to Self:  No Self-injurious Behavior: No Danger to Others: No Duty to Warn:no Physical Aggression / Violence:No  Access to Firearms a concern: No  Gang Involvement:No   Subjective: Counselor assessed pt current functioning per pt report.  Processed w/ pt recent positives, stressors and moods.  Reflected positives of setting boundaries in relationships and engaging w/ supports.  Discussed positive steps pt has taken w/ her personal projects  as breast cancer survivor and w/ her books.  Pt affect wnl.  Pt reports positives w/ her mood.  Pt reports she has been busy and excited for a breast cancer event putting on through her church in October.  Pt has been working towards in past and didn't thing would come together.  Pt discussed boundaries she has communicated in relationship and feels good about setting.    Interventions: Cognitive Behavioral Therapy and supportive  Diagnosis:Adjustment disorder with anxiety  Grief  Plan: Pt f/u in 2-3 weeks for counseling.  Pt to f/u a scheduled w/ PCP.       Individualized Treatment Plan Strengths: writing her own book  about her experience,  moving into home bought, her faith, enjoys shopping, joined a dance team  Supports: police friends, daughters, friends, spiritual mentors    Goal/Needs for Treatment:  In order of importance to patient 1) increase appropriate expression of feelings.   2) cope w/ stressors 3) ---    Client Statement of Needs:  I still need to work towards finding me, I can still question myself and what I am doing and want to be able to accept myself.  Continue to creat safe space to talk.     Treatment Level: outpt counseling  Symptoms: feeling anxious/on edge, grief  Client Treatment Preferences:biweekly counseling. Continue med management w/ PCP    Healthcare consumer's goal for treatment:   Counselor, Damien Herald, [facility] will support the patient's ability to achieve the goals identified. Cognitive Behavioral Therapy, Assertive Communication/Conflict Resolution Training, Relaxation Training, ACT, Humanistic and other evidenced-based practices will be used to promote progress towards healthy functioning.    Healthcare consumer will: Actively participate in therapy, working towards healthy functioning.     *Justification for Continuation/Discontinuation of Goal: R=Revised, O=Ongoing, A=Achieved, D=Discontinued   Goal 1) Increase pt verbal expression of feelings, grief and emotions. Baseline date 09/18/23: Progress towards goal 50; How Often - Daily Target Date Goal Was reviewed Status Code Progress towards goal/Likert rating  09/17/24                            Goal 2) Increase self care  and coping skills daily to manage stress to reduce anxiety and irritability AEB Pt report and therapist observation. Baseline date 09/18/23: Progress towards goal 50; How Often - Daily Target Date Goal Was reviewed Status Code Progress towards goal  09/17/24                            Goal 3) Increase self discovery, awareness of self, values and goals AEB Pt report and therapist  observation. Baseline date 09/18/23: Progress towards goal 25; How Often - Daily Target Date Goal Was reviewed Status Code Progress towards goal  09/17/24                            This plan has been reviewed and created by the following participants:  This plan will be reviewed at least every 12 months. Date Behavioral Health Clinician Date Guardian/Patient   09/18/23          [facility] 09/18/23 Verbal Consent Provided and electronic signature requested       10/07/23  Electronic signature completed by pt               BARBARANN APPL [facility]               [place], LCMHC

## 2023-11-14 DIAGNOSIS — J3089 Other allergic rhinitis: Secondary | ICD-10-CM | POA: Diagnosis not present

## 2023-11-14 DIAGNOSIS — J301 Allergic rhinitis due to pollen: Secondary | ICD-10-CM | POA: Diagnosis not present

## 2023-11-14 DIAGNOSIS — J3081 Allergic rhinitis due to animal (cat) (dog) hair and dander: Secondary | ICD-10-CM | POA: Diagnosis not present

## 2023-11-21 DIAGNOSIS — J301 Allergic rhinitis due to pollen: Secondary | ICD-10-CM | POA: Diagnosis not present

## 2023-11-21 DIAGNOSIS — J3089 Other allergic rhinitis: Secondary | ICD-10-CM | POA: Diagnosis not present

## 2023-11-21 DIAGNOSIS — J3081 Allergic rhinitis due to animal (cat) (dog) hair and dander: Secondary | ICD-10-CM | POA: Diagnosis not present

## 2023-11-26 ENCOUNTER — Encounter: Payer: Self-pay | Admitting: Internal Medicine

## 2023-11-26 ENCOUNTER — Inpatient Hospital Stay: Payer: Commercial Managed Care - PPO | Attending: Internal Medicine | Admitting: Internal Medicine

## 2023-11-26 ENCOUNTER — Other Ambulatory Visit (HOSPITAL_COMMUNITY): Payer: Self-pay

## 2023-11-26 ENCOUNTER — Other Ambulatory Visit: Payer: Self-pay

## 2023-11-26 ENCOUNTER — Telehealth: Payer: Self-pay | Admitting: *Deleted

## 2023-11-26 VITALS — BP 136/87 | HR 83 | Temp 96.0°F | Resp 18 | Ht 60.0 in | Wt 161.1 lb

## 2023-11-26 DIAGNOSIS — Z170 Estrogen receptor positive status [ER+]: Secondary | ICD-10-CM | POA: Insufficient documentation

## 2023-11-26 DIAGNOSIS — C50412 Malignant neoplasm of upper-outer quadrant of left female breast: Secondary | ICD-10-CM | POA: Diagnosis not present

## 2023-11-26 DIAGNOSIS — I890 Lymphedema, not elsewhere classified: Secondary | ICD-10-CM | POA: Diagnosis not present

## 2023-11-26 DIAGNOSIS — Z79810 Long term (current) use of selective estrogen receptor modulators (SERMs): Secondary | ICD-10-CM | POA: Diagnosis not present

## 2023-11-26 MED ORDER — TAMOXIFEN CITRATE 20 MG PO TABS
20.0000 mg | ORAL_TABLET | Freq: Every day | ORAL | 1 refills | Status: DC
  Filled 2023-11-26: qty 90, 90d supply, fill #0
  Filled 2024-02-09: qty 90, 90d supply, fill #1

## 2023-11-26 NOTE — Progress Notes (Signed)
 [facility] CONSULT NOTE  Patient Care Team: Gareth Mliss FALCON, FNP as PCP - General (Nurse Practitioner) Maurie Rayfield BIRCH, RN as Oncology Nurse Navigator Dannielle Arlean FALCON, RN (Inactive) as Oncology Nurse Navigator Pabon, Laneta FALCON, MD as Consulting Physician (General Surgery) Rennie Cindy SAUNDERS, MD as Consulting Physician (Oncology) Elisabeth Craig RAMAN, MD as Consulting Physician (Plastic Surgery)  CHIEF COMPLAINTS/PURPOSE OF CONSULTATION: Breast cancer   Oncology History Overview Note  # On physical exam, I palpate a focal firm 2 x 5 cm masslike area over the 11 to 1:30 position of the left breast approximately 2-4 cm from the nipple.   Targeted ultrasound is performed, showing no discrete focal abnormality over the upper mid to outer left breast. There is a pattern of dense fibroglandular tissue over this area which appears slightly different from the dense fibroglandular tissue elsewhere in the left breast. There is hazy decreased echogenicity with subtle shadowing within this dense tissue at the 12 to 1 o'clock position. Some of this vague hazy decreased echogenicity with shadowing is present at the 12 o'clock position 4 cm from the nipple more focal with harmonics and measures approximately 1.4 x 1.6 x 2 cm. There is another more focal area of decreased echogenicity and shadowing at the 1 o'clock position of the left breast 2 cm from the nipple measuring 1.4 x 1.5 x 1.5 cm. These 2 areas appear to be contiguous and are likely part of the same process.  1.4 x 1.6 x 2 cm. There is another more focal area of decreased echogenicity and shadowing at the 1 o'clock position of the left breast 2 cm from the nipple measuring 1.4 x 1.5 x 1.5 cm. These 2 areas appear to be contiguous and are likely part of the same process.  DIAGNOSIS:  A. 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   Size of invasive carcinoma: 2 mm  in this sample  Histologic grade of invasive carcinoma: Grade 2                       Glandular/tubular differentiation score: 3                       Nuclear pleomorphism score: 2                       Mitotic rate score: 1                       Total score: 6  Ductal carcinoma in situ: Present, intermediate grade  Lymphovascular invasion: Not identified    Ultrasound the left axilla is normal. CASE SUMMARY: BREAST BIOMARKER TESTS  Estrogen Receptor (ER) Status: POSITIVE          Percentage of cells with nuclear positivity: 90-100%          Average intensity of staining: Strong   Progesterone Receptor (PgR) Status: POSITIVE          Percentage of cells with nuclear positivity: 90-100%          Average intensity of staining: Strong   HER2 (by immunohistochemistry): NEGATIVE (Score 1+)  Ki-67: Not performed  Procedure: Simple mastectomy  Specimen Laterality: Left (bilateral mastectomy, tumor in left)   TUMOR  Histologic Type: Invasive mammary carcinoma, no special type  Histologic Grade (Nottingham Histologic Score)  Glandular (Acinar)/Tubular Differentiation: 2                       Nuclear Pleomorphism: 2                       Mitotic Rate: 2                       Overall Grade: 2  Tumor Size: 10 mm  Ductal Carcinoma In Situ (DCIS): Present, with extensive intraductal  component  Lymphovascular Invasion: Not identified  Treatment Effect in the Breast: No known presurgical treatment   MARGINS  Margin Status for Invasive Carcinoma: All margins negative for invasive  carcinoma                       Distance from invasive carcinoma to closest margin:  1.2 cm, posterior   Margin Status for DCIS: Margin(s) Involved by DCIS: All margins negative  for DCIS                       Distance from DCIS to closest margin: 1.2 cm,  posterior   REGIONAL LYMPH NODES  Regional Lymph Node Status: All regional lymph nodes negative for tumor                        Total Number of Lymph Nodes Examined (sentinel and  non-sentinel): 3                       Number of Sentinel Nodes Examined: 3   DISTANT METASTASIS  Distant Site(s) Involved, if applicable: Not applicable   PATHOLOGIC STAGE CLASSIFICATION (pTNM, AJCC 8th Edition):  TNM Descriptors: M (multifocal)  pT Category: pT1b  Regional Lymph Nodes Modifier: sn  pN Category: pN0  pM Category: Not applicable   SPECIAL STUDIES  Breast Biomarker Testing Performed on Previous Biopsy: [id number]   Estrogen Receptor (ER) Status: POSITIVE  Progesterone Receptor (PgR) Status: POSITIVE  HER2 (by immunohistochemistry): NEGATIVE (Score 1+)   #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  #June 19, 2021-tamoxifen  20 mg a day [ estrogen/LH-?  Postmenopausal].   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 VUS in RAD51D called c.434G> and in [facility] called c.476C>T also identified. The final report date is 05/09/2020.  The CancerNext-Expanded + RNAinsight gene panel offered by [employer] and includes sequencing and rearrangement analysis for the following 77 genes: IP, ALK, APC*, ATM*, AXIN2, BAP1, BARD1, BLM, BMPR1A, BRCA1*, BRCA2*, BRIP1*, CDC73, CDH1*,CDK4, CDKN1B, CDKN2A, CHEK2*, CTNNA1, DICER1, FANCC, FH, FLCN, GALNT12, KIF1B, LZTR1, MAX, MEN1, MET, MLH1*, MSH2*, MSH3, MSH6*, MUTYH*, NBN, NF1*, NF2, NTHL1, PALB2*, PHOX2B, PMS2*, POT1, PRKAR1A, PTCH1, PTEN*, RAD51C*, RAD51D*,RB1, RECQL, RET, SDHA, SDHAF2, SDHB, SDHC, SDHD,  SMAD4, SMARCA4, SMARCB1, SMARCE1, STK11, SUFU, TMEM127, TP53*,TSC1, TSC2, VHL and XRCC2 (sequencing and deletion/duplication); EGFR, EGLN1, HOXB13, KIT, MITF, PDGFRA, POLD1 and POLE (sequencing only); EPCAM and GREM1 (deletion/duplication only).    05/29/2021 Cancer Staging   Staging form:  Breast, AJCC 8th Edition - Pathologic: pT1b, pN0, cM0, ER+, PR+, HER2- - Signed by Rennie Cindy SAUNDERS, MD on 05/29/2021 Nuclear grade: G2    HISTORY OF PRESENTING ILLNESS: Alone.  Ambulating independently.  Tracey Huff 53 y.o.  female patient with stage I ER/PR positive HER2 negative left breast cancer  [CHEK2 mutant] s/p bilateral mastectomies on tamoxifen  is here for follow-up.   Patient status post evaluation with lymphedema occupational therapist.  Improved.  However she has not used jovipack as recommended.   Patient complains of continued hot flashes which are quite intense.  She is coping better with better after her husband's recent demise.   Denies any breast pain.Denies joint pain. 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   Review of Systems  Constitutional:  Negative for chills, diaphoresis, fever, malaise/fatigue and weight loss.  HENT:  Negative for nosebleeds and sore throat.   Eyes:  Negative for double vision.  Respiratory:  Negative for cough, hemoptysis, sputum production, shortness of breath and wheezing.   Cardiovascular:  Negative for chest pain, palpitations, orthopnea and leg swelling.  Gastrointestinal:  Negative for abdominal pain, blood in stool, constipation, diarrhea, heartburn, melena, nausea and vomiting.  Genitourinary:  Negative for dysuria, frequency and urgency.  Musculoskeletal:  Negative for back pain and joint pain.  Skin: Negative.  Negative for itching and rash.  Neurological:  Negative for dizziness, tingling, focal weakness, weakness and headaches.  Endo/Heme/Allergies:  Does not bruise/bleed easily.  Psychiatric/Behavioral:  Negative for  depression. The patient is not nervous/anxious and does not have insomnia.      MEDICAL HISTORY:  Past Medical History:  Diagnosis Date   Allergy    Anxiety    Asthma    Breast cancer (HCC)    INVASIVE MAMMARY CARCINOMA- left   Family history of prostate cancer    Fibroid uterus 11/15/2014   Hyperlipidemia    Hypertension    Menorrhagia with irregular cycle 11/15/2014   Pre-diabetes    Urticaria     SURGICAL HISTORY: Past Surgical History:  Procedure Laterality Date   ABDOMINAL HYSTERECTOMY N/A 11/15/2014   Procedure: HYSTERECTOMY ABDOMINAL/BILATERAL SALPINGECTOMY ;  Surgeon: Garnette JONETTA Mace, MD;  Location: [facility];  Service: Gynecology;  Laterality: N/A;   BREAST BIOPSY Right    2019 negative   BREAST BIOPSY Left 03/09/2021   u/s biopsy, 12-1 o'clock, VENUS clip-path pending   BREAST LUMPECTOMY,RADIO FREQ LOCALIZER,AXILLARY SENTINEL LYMPH NODE BIOPSY Left 03/28/2021   Procedure: BREAST LUMPECTOMY,RADIO FREQ LOCALIZER,AXILLARY SENTINEL LYMPH NODE BIOPSY;  Surgeon: Jordis Laneta FALCON, MD;  Location: [facility];  Service: General;  Laterality: Left;   BREAST RECONSTRUCTION WITH PLACEMENT OF TISSUE EXPANDER AND FLEX HD (ACELLULAR HYDRATED DERMIS) Bilateral 05/15/2021   Procedure: BILATERAL BREAST RECONSTRUCTION WITH PLACEMENT OF TISSUE EXPANDER AND FLEX HD (ACELLULAR HYDRATED DERMIS);  Surgeon: Elisabeth Craig RAMAN, MD;  Location: [facility];  Service: Plastics;  Laterality: Bilateral;   CESAREAN SECTION  1990   COLONOSCOPY WITH PROPOFOL  N/A 01/04/2022   Procedure: COLONOSCOPY WITH PROPOFOL ;  Surgeon: Therisa Bi, MD;  Location: [facility];  Service: Gastroenterology;  Laterality: N/A;   COLPOSCOPY  07/28/2018   CYSTOSCOPY N/A 11/15/2014   Procedure: CYSTOSCOPY;  Surgeon: Garnette JONETTA Mace, MD;  Location: [facility];  Service: Gynecology;  Laterality: N/A;   DILATION AND CURETTAGE OF UTERUS     GANGLION CYST EXCISION Left    REMOVAL OF BILATERAL TISSUE EXPANDERS WITH PLACEMENT OF  BILATERAL BREAST IMPLANTS Bilateral 10/02/2021   Procedure: REMOVAL OF  BILATERAL TISSUE EXPANDERS WITH PLACEMENT OF BILATERAL BREAST IMPLANTS;  Surgeon: Elisabeth Craig RAMAN, MD;  Location: [facility];  Service: Plastics;  Laterality: Bilateral;   TOTAL MASTECTOMY Bilateral 05/15/2021   Procedure: TOTAL MASTECTOMY, Bilateral Simple Mastectomy, skin sparing;  Surgeon: Jordis Laneta FALCON, MD;  Location: [facility];  Service: General;  Laterality: Bilateral;  Provider requesting for 2 hours / 120 min for procedure.   UTERINE FIBROID SURGERY      SOCIAL HISTORY: Social History   Socioeconomic History   Marital status: Married    Spouse name: Ubaldo   Number of children: 2   Years of education: Not on file   Highest education level: Not on file  Occupational History   Not on file  Tobacco Use   Smoking status: Never    Passive exposure: Never   Smokeless tobacco: Never  Vaping Use   Vaping status: Never Used  Substance and Sexual Activity   Alcohol use: Not Currently    Comment: occassional   Drug use: No   Sexual activity: Yes    Partners: Male    Birth control/protection: Surgical  Other Topics Concern   Not on file  Social History Narrative   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 Lives with husband.    Social Drivers of Corporate investment banker Strain: Low Risk  (08/09/2022)   Overall Financial Resource Strain (CARDIA)    Difficulty of Paying Living Expenses: Not hard at all  Food Insecurity: No Food Insecurity (08/09/2022)   Hunger Vital Sign    Worried About Running Out of Food in the Last Year: Never true    Ran Out of Food in the Last Year: Never true  Transportation Needs: No Transportation Needs (08/09/2022)   PRAPARE - Administrator, Civil Service (Medical): No    Lack of Transportation (Non-Medical): No  Physical Activity: Sufficiently Active (08/04/2023)   Received from [facility]    Exercise Vital Sign    On average, how many days per week do you engage in moderate to strenuous exercise (like a brisk walk)?: 6 days    On average, how many minutes do you engage in exercise at this level?: 30 min  Stress: No Stress Concern Present (08/09/2022)   [employer] of Occupational Health - Occupational Stress Questionnaire    Feeling of Stress : Only a little  Social Connections: Socially Integrated (08/09/2022)   Social Connection and Isolation Panel    Frequency of Communication with Friends and Family: More than three times a week    Frequency of Social Gatherings with Friends and Family: More than three times a week    Attends Religious Services: More than 4 times per year    Active Member of [employer] or Organizations: No    Attends Engineer, structural: More than 4 times per year    Marital Status: Married  Catering manager Violence: Not At Risk (08/09/2022)   Humiliation, Afraid, Rape, and Kick questionnaire    Fear of Current or Ex-Partner: No    Emotionally Abused: No    Physically Abused: No    Sexually Abused: No    FAMILY HISTORY: Family History  Problem Relation Age of Onset   Allergic rhinitis Mother    Congestive Heart Failure Mother    Diabetes Mother    Hypertension Mother    Diabetes Maternal Grandmother    Congestive Heart Failure Maternal Grandfather  Breast cancer Neg Hx     ALLERGIES:  is allergic to macadamia nut oil, apple juice, brazil nut (berthollefia excelsa), carrot oil, fruit & vegetable daily [nutritional supplements], kiwi extract, other, and peach [prunus persica].  MEDICATIONS:  Current Outpatient Medications  Medication Sig Dispense Refill   albuterol  (PROVENTIL  HFA) 108 (90 Base) MCG/ACT inhaler Inhale 1-2 puffs into the lungs every 4-6 hours as needed for cough/wheeze 6.7 g 0   ALPRAZolam  (XANAX ) 0.25 MG tablet Take 1 tablet (0.25 mg total) by mouth 2 (two) times daily as needed for anxiety (severe anxiety). 20 tablet 0    Ascorbic Acid (VITAMIN C) 100 MG tablet Take 100 mg by mouth daily.     atorvastatin  (LIPITOR) 10 MG tablet Take 1 tablet (10 mg total) by mouth daily at bedtime. 90 tablet 1   azelastine  (ASTELIN ) 0.1 % nasal spray Place 1-2 sprays into both nostrils 2 (two) times daily. 30 mL 6   busPIRone  (BUSPAR ) 10 MG tablet Take 1 tablet (10 mg total) by mouth 2 (two) times daily as needed (anxiety). 120 tablet 1   diphenhydrAMINE  (BENADRYL ) 2 % cream Apply 1 application topically 2 (two) times daily as needed for itching.     Docosanol  10 % CREA Apply 1 Application topically every 4 (four) hours. (Patient not taking: Reported on 10/06/2023) 2 g 0   ELDERBERRY PO Take 1 capsule by mouth daily.     EPINEPHrine  0.3 mg/0.3 mL IJ SOAJ injection Inject 0.3 mg into the muscle as needed, as directed. 2 each 1   ezetimibe  (ZETIA ) 10 MG tablet Take 1 tablet (10 mg total) by mouth daily. 90 tablet 3   fluticasone  (FLONASE ) 50 MCG/ACT nasal spray Place 1-2 sprays into both nostrils once daily. 16 g 6   Fluticasone -Umeclidin-Vilant (TRELEGY ELLIPTA ) 200-62.5-25 MCG/ACT AEPB Inhale 1 puff into the lungs daily. 60 each 6   hydrocortisone cream 1 % Apply 1 application topically daily as needed for itching.     hydrOXYzine  (VISTARIL ) 25 MG capsule TAKE 1 CAPSULE (25 MG TOTAL) BY MOUTH AT BEDTIME AS NEEDED FOR UP TO 30 DOSES (DIFFICULTY SLEEPING). 30 capsule 1   ipratropium-albuterol  (DUONEB) 0.5-2.5 (3) MG/3ML SOLN Take 3 mLs by nebulization every 6 (six) hours as needed (wheeze, SOB, cough variant asthma). 360 mL 1   levocetirizine (XYZAL ) 5 MG tablet Take 1 tablet (5 mg total) by mouth every evening. 30 tablet 6   losartan  (COZAAR ) 25 MG tablet Take 1 tablet (25 mg total) by mouth every morning. 90 tablet 1   meloxicam  (MOBIC ) 15 MG tablet Take 1 tablet (15 mg total) by mouth daily. (Patient not taking: Reported on 10/06/2023) 30 tablet 2   montelukast  (SINGULAIR ) 10 MG tablet Take 1 tablet (10 mg total) by mouth every  evening. 30 tablet 6   mupirocin  ointment (BACTROBAN ) 2 % Apply 1 Application topically 2 (two) times daily. 22 g 0   Olopatadine  HCl (PATADAY ) 0.2 % SOLN Instill 1 drop into affected eye once daily 2.5 mL 6   Ruxolitinib  Phosphate (OPZELURA ) 1.5 % CREA Apply 1 application topically as directed 1- 2  times daily. 60 g 11   Semaglutide ,0.25 or 0.5MG /DOS, (OZEMPIC , 0.25 OR 0.5 MG/DOSE,) 2 MG/3ML SOPN Inject 0.5 mg into the skin once a week. 9 mL 1   tamoxifen  (NOLVADEX ) 20 MG tablet Take 1 tablet (20 mg total) by mouth daily. 90 tablet 1   venlafaxine  XR (EFFEXOR  XR) 150 MG 24 hr capsule Take 1 capsule (150 mg total)  by mouth daily with breakfast. 90 capsule 1   zolpidem  (AMBIEN ) 5 MG tablet Take 1 tablet (5 mg total) by mouth at bedtime as needed for sleep. 90 tablet 1   No current facility-administered medications for this visit.     PHYSICAL EXAMINATION: ECOG PERFORMANCE STATUS: 0 - Asymptomatic  Vitals:   11/26/23 1044  BP: 136/87  Pulse: 83  Resp: 18  Temp: (!) 96 F (35.6 C)  SpO2: 100%   Filed Weights   11/26/23 1044  Weight: 161 lb 1.6 oz (73.1 kg)   Physical Exam Vitals and nursing note reviewed.  HENT:     Head: Normocephalic and atraumatic.     Mouth/Throat:     Pharynx: Oropharynx is clear.  Eyes:     Extraocular Movements: Extraocular movements intact.     Pupils: Pupils are equal, round, and reactive to light.  Cardiovascular:     Rate and Rhythm: Normal rate and regular rhythm.  Pulmonary:     Comments: Decreased breath sounds bilaterally.  Abdominal:     Palpations: Abdomen is soft.  Musculoskeletal:        General: Normal range of motion.     Cervical back: Normal range of motion.  Skin:    General: Skin is warm.  Neurological:     General: No focal deficit present.     Mental Status: She is alert and oriented to person, place, and time.  Psychiatric:        Behavior: Behavior normal.        Judgment: Judgment normal.    Mild lymphedema of left  chest wall/Left Upper UE-   LABORATORY DATA:  I have reviewed the data as listed Lab Results  Component Value Date   WBC 8.7 08/11/2023   HGB 13.2 08/11/2023   HCT 40.4 08/11/2023   MCV 84.2 08/11/2023   PLT 400 08/11/2023   Recent Labs    02/10/23 0939 08/11/23 1001  NA 139 140  K 4.1 4.3  CL 108 104  CO2 24 26  GLUCOSE 94 93  BUN 15 12  CREATININE 0.86 0.84  CALCIUM  9.4 9.9  PROT 6.8 7.7  AST 17 22  ALT 18 29  BILITOT 0.2 0.3    RADIOGRAPHIC STUDIES: I have personally reviewed the radiological images as listed and agreed with the findings in the report. No results found.  ASSESSMENT & PLAN:   Carcinoma of upper-outer quadrant of left breast in female, estrogen receptor positive (HCC) #Left breast cancer T12mN0- G-2; ER/PR positive HER2 negative status BIL postmastectomy [positive for CHEK-2 mutation].  Oncotype recurrence score 16/LOW. On Tamoxifen  [s/p hysterectomy [intact ovaries]; no role for mammograms.  Stable.  Sent Tam refills.   #Patient tolerated tamoxifen  [until spring 2028]  fairly well except for hot flashes. Refilled.   # Left chest wall- lymphedema/ also affecting left carpal tunnel- s/p evaluation with Del[facility], discussed with Deland re: jovipack. Will send off script.   # Hot flashes-grade  2-3-recommend compliance with Effexor -   # Carpal tunnel- Left UE [[facility]]- also worse re: lymphedema- awaiting NCS.   # Cancer screening: colonoscopy [sep Adin.Akin ]  # DISPOSITION:  # follow up 6 months- MD; no labs- Dr.B    All questions were answered. The patient/family knows to call the clinic with any problems, questions or concerns.     Cindy JONELLE Joe, MD 11/26/2023 11:04 AM

## 2023-11-26 NOTE — Progress Notes (Signed)
 Patient has no new or acute concerns today.

## 2023-11-26 NOTE — Telephone Encounter (Signed)
 Written order for unilateral postmastectomy Jovi pack breast pad for L thoracic lymphedema was faxed to [facility] in [place] with medical documentation.

## 2023-11-26 NOTE — Assessment & Plan Note (Addendum)
#  Left breast cancer T30mN0- G-2; ER/PR positive HER2 negative status BIL postmastectomy [positive for CHEK-2 mutation].  Oncotype recurrence score 16/LOW. On Tamoxifen  [s/p hysterectomy [intact ovaries]; no role for mammograms.  Stable.  Sent Tam refills.   #Patient tolerated tamoxifen  [until spring 2028]  fairly well except for hot flashes. Refilled.   # Left chest wall- lymphedema/ also affecting left carpal tunnel- s/p evaluation with Deland, discussed with Deland re: jovipack. Will send off script.   # Hot flashes-grade  2-3-recommend compliance with Effexor -   # Carpal tunnel- Left UE [[facility]]- also worse re: lymphedema- awaiting NCS.   # Cancer screening: colonoscopy [sep Adin.Akin ]  # DISPOSITION:  # follow up 6 months- MD; no labs- Dr.B

## 2023-11-28 DIAGNOSIS — J3089 Other allergic rhinitis: Secondary | ICD-10-CM | POA: Diagnosis not present

## 2023-11-28 DIAGNOSIS — J3081 Allergic rhinitis due to animal (cat) (dog) hair and dander: Secondary | ICD-10-CM | POA: Diagnosis not present

## 2023-11-28 DIAGNOSIS — J301 Allergic rhinitis due to pollen: Secondary | ICD-10-CM | POA: Diagnosis not present

## 2023-12-03 ENCOUNTER — Ambulatory Visit: Admitting: Psychology

## 2023-12-04 ENCOUNTER — Ambulatory Visit (INDEPENDENT_AMBULATORY_CARE_PROVIDER_SITE_OTHER): Admitting: Psychology

## 2023-12-04 ENCOUNTER — Ambulatory Visit: Admitting: Psychology

## 2023-12-04 DIAGNOSIS — F4322 Adjustment disorder with anxiety: Secondary | ICD-10-CM

## 2023-12-04 DIAGNOSIS — F4323 Adjustment disorder with mixed anxiety and depressed mood: Secondary | ICD-10-CM

## 2023-12-04 DIAGNOSIS — F4321 Adjustment disorder with depressed mood: Secondary | ICD-10-CM

## 2023-12-04 NOTE — Progress Notes (Signed)
 [facility] Counselor/Therapist Progress Note  Patient ID: Tracey Huff, MRN: [id number],    Date: 12/04/2023  Time Spent: 11:55am-12:40pm     Treatment Type: Individual Therapy   pt is seen for an in person visit  Reported Symptoms:  pt reported emotional escalation grief related this week.  Pt reports some work stressors and fatigue.    Mental Status Exam: Appearance:  N/a     Behavior: Appropriate  Motor: n/a  Speech/Language:  Clear and Coherent  Affect: Appropriate  Mood: sad- at times  Thought process: normal  Thought content:   WNL  Sensory/Perceptual disturbances:   WNL  Orientation: oriented to person, place, time/date, and situation  Attention: Good  Concentration: Good  Memory: WNL  Fund of knowledge:  Good  Insight:   Good  Judgment:  Good  Impulse Control: Good   Risk Assessment: Danger to Self:  No Self-injurious Behavior: No Danger to Others: No Duty to Warn:no Physical Aggression / Violence:No  Access to Firearms a concern: No  Gang Involvement:No   Subjective: Counselor assessed pt current functioning per pt report.  Processed w/ pt recent positives, stressors and emotional escalation.  Explored contributing factors and supports and coping.  Explored ways to acknowledge emotions and feelings.   Pt affect wnl.  Pt reports positives overall mood good.  Pt reports some moments of sadness.  Pt reports tired after long work days but remaining engaged.  Pt reports emotional escalation and tearful episode this week.  Pt discussed stressors present that may have impacted and support had.  Pt reflected on giving self self compassion w/ grieving and process.  Pt is looking forward and reports progress toward her  breast cancer event.   Interventions: Cognitive Behavioral Therapy and supportive  Diagnosis:Adjustment disorder with anxiety  Grief  Plan: Pt f/u in 2-3 weeks for counseling.  Pt to f/u a scheduled w/ PCP.        Individualized Treatment Plan Strengths: writing her own book about her experience,  moving into home bought, her faith, enjoys shopping, joined a dance team  Supports: police friends, daughters, friends, spiritual mentors    Goal/Needs for Treatment:  In order of importance to patient 1) increase appropriate expression of feelings.   2) cope w/ stressors 3) ---    Client Statement of Needs:  I still need to work towards finding me, I can still question myself and what I am doing and want to be able to accept myself.  Continue to creat safe space to talk.     Treatment Level: outpt counseling  Symptoms: feeling anxious/on edge, grief  Client Treatment Preferences:biweekly counseling. Continue med management w/ PCP    Healthcare consumer's goal for treatment:   Counselor, Tracey Huff, [facility] will support the patient's ability to achieve the goals identified. Cognitive Behavioral Therapy, Assertive Communication/Conflict Resolution Training, Relaxation Training, ACT, Humanistic and other evidenced-based practices will be used to promote progress towards healthy functioning.    Healthcare consumer will: Actively participate in therapy, working towards healthy functioning.     *Justification for Continuation/Discontinuation of Goal: R=Revised, O=Ongoing, A=Achieved, D=Discontinued   Goal 1) Increase pt verbal expression of feelings, grief and emotions. Baseline date 09/18/23: Progress towards goal 50; How Often - Daily Target Date Goal Was reviewed Status Code Progress towards goal/Likert rating  09/17/24  Goal 2) Increase self care and coping skills daily to manage stress to reduce anxiety and irritability AEB Pt report and therapist observation. Baseline date 09/18/23: Progress towards goal 50; How Often - Daily Target Date Goal Was reviewed Status Code Progress towards goal  09/17/24                            Goal 3) Increase self discovery,  awareness of self, values and goals AEB Pt report and therapist observation. Baseline date 09/18/23: Progress towards goal 25; How Often - Daily Target Date Goal Was reviewed Status Code Progress towards goal  09/17/24                            This plan has been reviewed and created by the following participants:  This plan will be reviewed at least every 12 months. Date Behavioral Health Clinician Date Guardian/Patient   09/18/23          Tracey Huff 09/18/23 Verbal Consent Provided and electronic signature requested       10/07/23  Electronic signature completed by pt                    Tracey Huff, LCMHC

## 2023-12-05 DIAGNOSIS — J3089 Other allergic rhinitis: Secondary | ICD-10-CM | POA: Diagnosis not present

## 2023-12-05 DIAGNOSIS — J3081 Allergic rhinitis due to animal (cat) (dog) hair and dander: Secondary | ICD-10-CM | POA: Diagnosis not present

## 2023-12-05 DIAGNOSIS — J301 Allergic rhinitis due to pollen: Secondary | ICD-10-CM | POA: Diagnosis not present

## 2023-12-15 ENCOUNTER — Other Ambulatory Visit: Payer: Self-pay | Admitting: Family Medicine

## 2023-12-15 ENCOUNTER — Other Ambulatory Visit (HOSPITAL_COMMUNITY): Payer: Self-pay

## 2023-12-18 ENCOUNTER — Other Ambulatory Visit (HOSPITAL_COMMUNITY): Payer: Self-pay

## 2023-12-18 ENCOUNTER — Telehealth: Payer: Self-pay | Admitting: Nurse Practitioner

## 2023-12-18 ENCOUNTER — Other Ambulatory Visit: Payer: Self-pay

## 2023-12-18 DIAGNOSIS — J4520 Mild intermittent asthma, uncomplicated: Secondary | ICD-10-CM

## 2023-12-18 MED ORDER — IPRATROPIUM-ALBUTEROL 0.5-2.5 (3) MG/3ML IN SOLN
3.0000 mL | Freq: Four times a day (QID) | RESPIRATORY_TRACT | 1 refills | Status: AC | PRN
  Filled 2023-12-18: qty 360, 30d supply, fill #0

## 2023-12-18 NOTE — Telephone Encounter (Unsigned)
 Copied from CRM [id number]. Topic: Clinical - Order For Equipment >> Dec 18, 2023 10:55 AM Jasmin G wrote: Reason for CRM: Pt called regarding the status of her nebulizer, she stated that her preferred pharmacy, [place] [facility], told her that they would send request to PCP, Mrs.Pender Mliss, upon searching recent Encounters I told pt that it seems like we're still waiting on approval from PCP, please message her through MyChart to give her an update as she states she really is in need of the nebulizer.

## 2023-12-18 NOTE — Telephone Encounter (Signed)
 Requested medication (s) are due for refill today: yes  Requested medication (s) are on the active medication list: yes  Last refill:  11/10/20  Future visit scheduled: yes  Notes to clinic: order written 2022   Requested Prescriptions  Pending Prescriptions Disp Refills   ipratropium-albuterol  (DUONEB) 0.5-2.5 (3) MG/3ML SOLN 360 mL 1    Sig: Take 3 mLs by nebulization every 6 (six) hours as needed (wheeze, SOB, cough variant asthma).     Pulmonology:  Combination Products - albuterol  / ipratropium Passed - 12/18/2023  2:00 PM      Passed - Last BP in normal range    BP Readings from Last 1 Encounters:  11/26/23 136/87         Passed - Last Heart Rate in normal range    Pulse Readings from Last 1 Encounters:  11/26/23 83         Passed - Valid encounter within last 12 months    Recent Outpatient Visits           2 months ago Annual physical exam   [facility] Gareth Mliss FALCON, FNP   4 months ago Immunization due   [facility] Gareth Mliss FALCON, FNP       Future Appointments             In 2 months Hester Alm BROCKS, MD [facility]   In 9 months Gareth, Mliss FALCON, FNP [facility]

## 2023-12-19 DIAGNOSIS — J3081 Allergic rhinitis due to animal (cat) (dog) hair and dander: Secondary | ICD-10-CM | POA: Diagnosis not present

## 2023-12-19 DIAGNOSIS — J301 Allergic rhinitis due to pollen: Secondary | ICD-10-CM | POA: Diagnosis not present

## 2023-12-19 DIAGNOSIS — J3089 Other allergic rhinitis: Secondary | ICD-10-CM | POA: Diagnosis not present

## 2023-12-25 ENCOUNTER — Ambulatory Visit (INDEPENDENT_AMBULATORY_CARE_PROVIDER_SITE_OTHER): Admitting: Psychology

## 2023-12-25 DIAGNOSIS — F4323 Adjustment disorder with mixed anxiety and depressed mood: Secondary | ICD-10-CM | POA: Diagnosis not present

## 2023-12-25 DIAGNOSIS — F4322 Adjustment disorder with anxiety: Secondary | ICD-10-CM

## 2023-12-25 DIAGNOSIS — F4321 Adjustment disorder with depressed mood: Secondary | ICD-10-CM

## 2023-12-25 NOTE — Progress Notes (Signed)
 [facility] Counselor/Therapist Progress Note  Patient ID: Tracey Huff, MRN: [id number],    Date: 12/25/2023  Time Spent: 12:05pm-12:57pm     Treatment Type: Individual Therapy   pt is seen for an in person visit  Reported Symptoms:  pt reported excitement about talk/program upcoming and building confidence.       Mental Status Exam: Appearance:  N/a     Behavior: Appropriate  Motor: n/a  Speech/Language:  Clear and Coherent  Affect: Appropriate  Mood: anxious- some  Thought process: normal  Thought content:   WNL  Sensory/Perceptual disturbances:   WNL  Orientation: oriented to person, place, time/date, and situation  Attention: Good  Concentration: Good  Memory: WNL  Fund of knowledge:  Good  Insight:   Good  Judgment:  Good  Impulse Control: Good   Risk Assessment: Danger to Self:  No Self-injurious Behavior: No Danger to Others: No Duty to Warn:no Physical Aggression / Violence:No  Access to Firearms a concern: No  Gang Involvement:No   Subjective: Counselor assessed pt current functioning per pt report.  Processed w/ pt recent positives, stressor and anxiety.  Explored talks/presentations recent and how not avoiding and coping.  Reflected confidence building.  Discussed relationships and building connections and maintaining boundaries.   Pt affect wnl.  Pt reports she is excited about her program/presentation preparing for October.  Pt reports that she has had couple presentations recent and anxiety that feels prior to talking.  Pt reflected on progress has been making and encouragement getting from others and working towards encouraging self and confidence building.  Pt discussed recent interactions and decisions making to what relationships wanting to give energy to.    Interventions: Cognitive Behavioral Therapy, Assertiveness/Communication, and supportive  Diagnosis:Adjustment disorder with anxiety  Grief  Plan: Pt f/u in 2-3 weeks for  counseling.  Pt to f/u a scheduled w/ PCP.       Individualized Treatment Plan Strengths: writing her own book about her experience,  moving into home bought, her faith, enjoys shopping, joined a dance team  Supports: police friends, daughters, friends, spiritual mentors    Goal/Needs for Treatment:  In order of importance to patient 1) increase appropriate expression of feelings.   2) cope w/ stressors 3) ---    Client Statement of Needs:  I still need to work towards finding me, I can still question myself and what I am doing and want to be able to accept myself.  Continue to creat safe space to talk.     Treatment Level: outpt counseling  Symptoms: feeling anxious/on edge, grief  Client Treatment Preferences:biweekly counseling. Continue med management w/ PCP    Healthcare consumer's goal for treatment:   Counselor, Damien Herald, [facility] will support the patient's ability to achieve the goals identified. Cognitive Behavioral Therapy, Assertive Communication/Conflict Resolution Training, Relaxation Training, ACT, Humanistic and other evidenced-based practices will be used to promote progress towards healthy functioning.    Healthcare consumer will: Actively participate in therapy, working towards healthy functioning.     *Justification for Continuation/Discontinuation of Goal: R=Revised, O=Ongoing, A=Achieved, D=Discontinued   Goal 1) Increase pt verbal expression of feelings, grief and emotions. Baseline date 09/18/23: Progress towards goal 50; How Often - Daily Target Date Goal Was reviewed Status Code Progress towards goal/Likert rating  09/17/24                            Goal 2) Increase self  care and coping skills daily to manage stress to reduce anxiety and irritability AEB Pt report and therapist observation. Baseline date 09/18/23: Progress towards goal 50; How Often - Daily Target Date Goal Was reviewed Status Code Progress towards goal  09/17/24                             Goal 3) Increase self discovery, awareness of self, values and goals AEB Pt report and therapist observation. Baseline date 09/18/23: Progress towards goal 25; How Often - Daily Target Date Goal Was reviewed Status Code Progress towards goal  09/17/24                            This plan has been reviewed and created by the following participants:  This plan will be reviewed at least every 12 months. Date Behavioral Health Clinician Date Guardian/Patient   09/18/23          [facility] Barbarann [facility] 09/18/23 Verbal Consent Provided and electronic signature requested       10/07/23  Electronic signature completed by pt                  [place], LCMHC

## 2023-12-25 NOTE — Addendum Note (Signed)
 Addended by: Dyquan Minks M on: 12/25/2023 11:28 PM   Modules accepted: Level of Service

## 2023-12-26 DIAGNOSIS — J301 Allergic rhinitis due to pollen: Secondary | ICD-10-CM | POA: Diagnosis not present

## 2023-12-26 DIAGNOSIS — J3081 Allergic rhinitis due to animal (cat) (dog) hair and dander: Secondary | ICD-10-CM | POA: Diagnosis not present

## 2023-12-26 DIAGNOSIS — J3089 Other allergic rhinitis: Secondary | ICD-10-CM | POA: Diagnosis not present

## 2024-01-02 DIAGNOSIS — J3089 Other allergic rhinitis: Secondary | ICD-10-CM | POA: Diagnosis not present

## 2024-01-02 DIAGNOSIS — J301 Allergic rhinitis due to pollen: Secondary | ICD-10-CM | POA: Diagnosis not present

## 2024-01-02 DIAGNOSIS — J3081 Allergic rhinitis due to animal (cat) (dog) hair and dander: Secondary | ICD-10-CM | POA: Diagnosis not present

## 2024-01-09 DIAGNOSIS — J3081 Allergic rhinitis due to animal (cat) (dog) hair and dander: Secondary | ICD-10-CM | POA: Diagnosis not present

## 2024-01-09 DIAGNOSIS — J301 Allergic rhinitis due to pollen: Secondary | ICD-10-CM | POA: Diagnosis not present

## 2024-01-09 DIAGNOSIS — J3089 Other allergic rhinitis: Secondary | ICD-10-CM | POA: Diagnosis not present

## 2024-01-15 ENCOUNTER — Ambulatory Visit (INDEPENDENT_AMBULATORY_CARE_PROVIDER_SITE_OTHER): Admitting: Psychology

## 2024-01-15 DIAGNOSIS — F4321 Adjustment disorder with depressed mood: Secondary | ICD-10-CM

## 2024-01-15 DIAGNOSIS — F4323 Adjustment disorder with mixed anxiety and depressed mood: Secondary | ICD-10-CM | POA: Diagnosis not present

## 2024-01-15 DIAGNOSIS — F4322 Adjustment disorder with anxiety: Secondary | ICD-10-CM

## 2024-01-15 NOTE — Progress Notes (Signed)
 [facility] Counselor/Therapist Progress Note  Patient ID: Tracey Huff, MRN: [id number],    Date: 01/15/2024  Time Spent: 1:29pm-2:23pm     Treatment Type: Individual Therapy   pt is seen for an in person visit  Reported Symptoms:  pt reported some periods of anxiety, grief.  Pt reports positives of her upcoming talk  Mental Status Exam: Appearance:  Well Groomed     Behavior: Appropriate  Motor: Normal and n/a  Speech/Language:  Clear and Coherent  Affect: Appropriate  Mood: anxious- some  Thought process: normal  Thought content:   WNL  Sensory/Perceptual disturbances:   WNL  Orientation: oriented to person, place, time/date, and situation  Attention: Good  Concentration: Good  Memory: WNL  Fund of knowledge:  Good  Insight:   Good  Judgment:  Good  Impulse Control: Good   Risk Assessment: Danger to Self:  No Self-injurious Behavior: No Danger to Others: No Duty to Warn:no Physical Aggression / Violence:No  Access to Firearms a concern: No  Gang Involvement:No   Subjective: Counselor assessed pt current functioning per pt report.  Processed w/ pt recent positives, stressor and emotions.  Reflected and normalized grief emotions w/ upcoming anniversary.  Discussed pt self care and discussed supports and interactions that are encouraging.   Explored continued progress w/ insight for self and building confidence.  Discussed boundaries and red flags in potential relationships.   Pt affect wnl.  Pt reports she has been having good and bad days- some days more anxious and tearful.  Pt acknowledges and validated her grief.  Pt reports positives of interactions w/ friends and supports.  Pt discussed focus on her program as cancer survivor and feeling positive about progress towards.  Pt shared about various interactions and some red flags of men he has talked to and boundaries she has set.  Pt discussed her self care plan for next couple of weeks.    Interventions: Cognitive Behavioral Therapy, Assertiveness/Communication, and supportive  Diagnosis:Adjustment disorder with anxiety  Grief  Plan: Pt f/u in 2-3 weeks for counseling.  Pt to f/u a scheduled w/ PCP.       Individualized Treatment Plan Strengths: writing her own book about her experience,  moving into home bought, her faith, enjoys shopping, joined a dance team  Supports: police friends, daughters, friends, spiritual mentors    Goal/Needs for Treatment:  In order of importance to patient 1) increase appropriate expression of feelings.   2) cope w/ stressors 3) ---    Client Statement of Needs:  I still need to work towards finding me, I can still question myself and what I am doing and want to be able to accept myself.  Continue to creat safe space to talk.     Treatment Level: outpt counseling  Symptoms: feeling anxious/on edge, grief  Client Treatment Preferences:biweekly counseling. Continue med management w/ PCP    Healthcare consumer's goal for treatment:   Counselor, Damien Herald, [facility] will support the patient's ability to achieve the goals identified. Cognitive Behavioral Therapy, Assertive Communication/Conflict Resolution Training, Relaxation Training, ACT, Humanistic and other evidenced-based practices will be used to promote progress towards healthy functioning.    Healthcare consumer will: Actively participate in therapy, working towards healthy functioning.     *Justification for Continuation/Discontinuation of Goal: R=Revised, O=Ongoing, A=Achieved, D=Discontinued   Goal 1) Increase pt verbal expression of feelings, grief and emotions. Baseline date 09/18/23: Progress towards goal 50; How Often - Daily Target Date  Goal Was reviewed Status Code Progress towards goal/Likert rating  09/17/24                            Goal 2) Increase self care and coping skills daily to manage stress to reduce anxiety and irritability AEB Pt report and therapist  observation. Baseline date 09/18/23: Progress towards goal 50; How Often - Daily Target Date Goal Was reviewed Status Code Progress towards goal  09/17/24                            Goal 3) Increase self discovery, awareness of self, values and goals AEB Pt report and therapist observation. Baseline date 09/18/23: Progress towards goal 25; How Often - Daily Target Date Goal Was reviewed Status Code Progress towards goal  09/17/24                            This plan has been reviewed and created by the following participants:  This plan will be reviewed at least every 12 months. Date Behavioral Health Clinician Date Guardian/Patient   09/18/23          [facility] Barbarann [facility] 09/18/23 Verbal Consent Provided and electronic signature requested       10/07/23  Electronic signature completed by pt               [place], LCMHC

## 2024-01-16 DIAGNOSIS — J3081 Allergic rhinitis due to animal (cat) (dog) hair and dander: Secondary | ICD-10-CM | POA: Diagnosis not present

## 2024-01-16 DIAGNOSIS — J3089 Other allergic rhinitis: Secondary | ICD-10-CM | POA: Diagnosis not present

## 2024-01-16 DIAGNOSIS — J301 Allergic rhinitis due to pollen: Secondary | ICD-10-CM | POA: Diagnosis not present

## 2024-01-23 DIAGNOSIS — J3089 Other allergic rhinitis: Secondary | ICD-10-CM | POA: Diagnosis not present

## 2024-01-23 DIAGNOSIS — J3081 Allergic rhinitis due to animal (cat) (dog) hair and dander: Secondary | ICD-10-CM | POA: Diagnosis not present

## 2024-01-23 DIAGNOSIS — J301 Allergic rhinitis due to pollen: Secondary | ICD-10-CM | POA: Diagnosis not present

## 2024-01-29 ENCOUNTER — Other Ambulatory Visit (HOSPITAL_COMMUNITY): Payer: Self-pay

## 2024-01-29 ENCOUNTER — Encounter: Payer: Self-pay | Admitting: Nurse Practitioner

## 2024-01-29 ENCOUNTER — Ambulatory Visit: Admitting: Nurse Practitioner

## 2024-01-29 ENCOUNTER — Ambulatory Visit: Payer: Self-pay

## 2024-01-29 VITALS — BP 134/76 | HR 98 | Temp 98.4°F | Resp 16 | Ht 60.0 in | Wt 162.0 lb

## 2024-01-29 DIAGNOSIS — S90561A Insect bite (nonvenomous), right ankle, initial encounter: Secondary | ICD-10-CM

## 2024-01-29 DIAGNOSIS — W57XXXA Bitten or stung by nonvenomous insect and other nonvenomous arthropods, initial encounter: Secondary | ICD-10-CM

## 2024-01-29 MED ORDER — TRIAMCINOLONE ACETONIDE 0.1 % EX CREA
1.0000 | TOPICAL_CREAM | Freq: Two times a day (BID) | CUTANEOUS | 0 refills | Status: AC
  Filled 2024-01-29: qty 30, 15d supply, fill #0

## 2024-01-29 NOTE — Progress Notes (Signed)
 BP 134/76   Pulse 98   Temp 98.4 F (36.9 C) (Oral)   Resp 16   Ht 5' (1.524 m)   Wt 162 lb (73.5 kg)   LMP 11/15/2014   SpO2 100%   BMI 31.64 kg/m    Subjective:    Patient ID: Tracey Huff, female    DOB: 01/30/1971, 53 y.o.   MRN: [id number]  HPI: Tracey Huff is a 53 y.o. female presenting today with complaints of an insect bite to her right ankle that happened two days ago. She is unsure of what kind of insect it was. She endorses moderate itching and swelling but denies pain to the area or trouble walking. She has been taking xyzal  at night and been applying ice to help with the itching and swelling.          01/29/2024    3:01 PM 11/26/2023   10:39 AM 10/06/2023    8:49 AM  Depression screen PHQ 2/9  Decreased Interest 0 0 0  Down, Depressed, Hopeless 0 0 0  PHQ - 2 Score 0 0 0  Altered sleeping 0  0  Tired, decreased energy 0  0  Change in appetite 0  0  Feeling bad or failure about yourself  0  0  Trouble concentrating 0  0  Moving slowly or fidgety/restless 0  0  Suicidal thoughts 0  0  PHQ-9 Score 0  0  Difficult doing work/chores Not difficult at all  Not difficult at all    Relevant past medical, surgical, family and social history reviewed and updated as indicated. Interim medical history since our last visit reviewed. Allergies and medications reviewed and updated.  Review of Systems  Ten systems reviewed and is negative except as mentioned in HPI       Objective:     BP 134/76   Pulse 98   Temp 98.4 F (36.9 C) (Oral)   Resp 16   Ht 5' (1.524 m)   Wt 162 lb (73.5 kg)   LMP 11/15/2014   SpO2 100%   BMI 31.64 kg/m    Wt Readings from Last 3 Encounters:  01/29/24 162 lb (73.5 kg)  11/26/23 161 lb 1.6 oz (73.1 kg)  10/06/23 159 lb 1.6 oz (72.2 kg)    Physical Exam Constitutional:      Appearance: Normal appearance.  Cardiovascular:     Rate and Rhythm: Normal rate and regular rhythm.     Pulses: Normal pulses.     Heart  sounds: Normal heart sounds.  Pulmonary:     Effort: Pulmonary effort is normal.     Breath sounds: Normal breath sounds.  Musculoskeletal:     Cervical back: Normal range of motion and neck supple.  Feet:     Comments: Swelling and redness to right ankle.  Skin:    General: Skin is warm and dry.  Neurological:     Mental Status: She is alert.      Results for orders placed or performed in visit on 08/11/23  Lipid panel   Collection Time: 08/11/23 10:01 AM  Result Value Ref Range   Cholesterol 242 (H) <200 mg/dL   HDL 61 > OR = 50 mg/dL   Triglycerides 892 <849 mg/dL   LDL Cholesterol (Calc) 159 (H) mg/dL (calc)   Total CHOL/HDL Ratio 4.0 <5.0 (calc)   Non-HDL Cholesterol (Calc) 181 (H) <130 mg/dL (calc)  Comprehensive metabolic panel with GFR   Collection Time: 08/11/23 10:01 AM  Result Value Ref Range   Glucose, Bld 93 65 - 99 mg/dL   BUN 12 7 - 25 mg/dL   Creat 9.15 9.49 - 8.96 mg/dL   eGFR 84 > OR = 60 fO/fpw/8.26f7   BUN/Creatinine Ratio SEE NOTE: 6 - 22 (calc)   Sodium 140 135 - 146 mmol/L   Potassium 4.3 3.5 - 5.3 mmol/L   Chloride 104 98 - 110 mmol/L   CO2 26 20 - 32 mmol/L   Calcium  9.9 8.6 - 10.4 mg/dL   Total Protein 7.7 6.1 - 8.1 g/dL   Albumin 4.7 3.6 - 5.1 g/dL   Globulin 3.0 1.9 - 3.7 g/dL (calc)   AG Ratio 1.6 1.0 - 2.5 (calc)   Total Bilirubin 0.3 0.2 - 1.2 mg/dL   Alkaline phosphatase (APISO) 44 37 - 153 U/L   AST 22 10 - 35 U/L   ALT 29 6 - 29 U/L  CBC with Differential/Platelet   Collection Time: 08/11/23 10:01 AM  Result Value Ref Range   WBC 8.7 3.8 - 10.8 Thousand/uL   RBC 4.80 3.80 - 5.10 Million/uL   Hemoglobin 13.2 11.7 - 15.5 g/dL   HCT 59.5 64.9 - 54.9 %   MCV 84.2 80.0 - 100.0 fL   MCH 27.5 27.0 - 33.0 pg   MCHC 32.7 32.0 - 36.0 g/dL   RDW 86.1 88.9 - 84.9 %   Platelets 400 140 - 400 Thousand/uL   MPV 10.2 7.5 - 12.5 fL   Neutro Abs 4,863 1,500 - 7,800 cells/uL   Absolute Lymphocytes 2,897 850 - 3,900 cells/uL   Absolute  Monocytes 653 200 - 950 cells/uL   Eosinophils Absolute 226 15 - 500 cells/uL   Basophils Absolute 61 0 - 200 cells/uL   Neutrophils Relative % 55.9 %   Total Lymphocyte 33.3 %   Monocytes Relative 7.5 %   Eosinophils Relative 2.6 %   Basophils Relative 0.7 %  Hemoglobin A1c   Collection Time: 08/11/23 10:01 AM  Result Value Ref Range   Hgb A1c MFr Bld 6.2 (H) <5.7 % of total Hgb   Mean Plasma Glucose 131 mg/dL   eAG (mmol/L) 7.3 mmol/L  Microalbumin / creatinine urine ratio   Collection Time: 08/11/23 10:01 AM  Result Value Ref Range   Creatinine, Urine 196 20 - 275 mg/dL   Microalb, Ur 1.9 mg/dL   Microalb Creat Ratio 10 <30 mg/g creat          Assessment & Plan:   Problem List Items Addressed This Visit   None Visit Diagnoses       Insect bite of right ankle, initial encounter    -  Primary   Triamcinolone  cream sent to pharmacy. Continue applying cool compresses, Take Pepcid  2 times a day   Relevant Medications   triamcinolone  cream (KENALOG ) 0.1 %       -Begin applying kenalog  cream to affected area two times daily -Continue with cool compresses to area to help with inflammation -Continue taking xyzal  and add in pepcid  twice daily for itching relief          Follow up plan: Return if symptoms worsen or fail to improve.  I have reviewed this encounter including the documentation in this note and/or discussed this patient with the provider, Aislinn Womack, SNP, I am certifying that I agree with the content of this note as supervising/preceptor nurse practitioner.  Mliss Spray, FNP-C [facility] 01/29/2024, 3:44 PM

## 2024-01-29 NOTE — Telephone Encounter (Signed)
 FYI Only or Action Required?: Action required by provider: request for appointment.  Patient was last seen in primary care on 10/06/2023 by Gareth Mliss FALCON, FNP.  Called Nurse Triage reporting Insect Bite.  Symptoms began several days ago.  Interventions attempted: Rest, hydration, or home remedies.  Symptoms are: gradually worsening. Has some swelling at site and itching,  Triage Disposition: See PCP When Office is Open (Within 3 Days)  Patient/caregiver understands and will follow disposition?: yes   Copied from CRM #[id number]. Topic: Clinical - Red Word Triage >> Jan 29, 2024  8:44 AM Berneda FALCON wrote: Red Word that prompted transfer to Nurse Triage: Patient states she was bit by something on Tuesday. This is her right ankle and it is swollen and there is a white bump where the bite is at.  There was some heat to it yesterday but not today Reason for Disposition  [1] SEVERE local itching (e.g., interferes with work, school, sleep) AND [2] not improved after 24 hours of hydrocortisone cream  Answer Assessment - Initial Assessment Questions 1. TYPE of INSECT: What type of insect was it?      unsure 2. ONSET: When did you get bitten?      Tuesday 3. LOCATION: Where is the insect bite located?      Right ankle 4. REDNESS: Is the area red or pink? If Yes, ask: What size is the area of redness? (inches or cm). When did the redness start?     discolored 5. PAIN: Is there any pain? If Yes, ask: How bad is the pain? (Scale 0-10; or none, mild, moderate, severe)     no 6. ITCHING: Does it itch? If Yes, ask: How bad is the itch?      moderate 7. SWELLING: How big is the swelling? (e.g., inches, cm, or compare to coins)     mild 8. OTHER SYMPTOMS: Do you have any other symptoms?  (e.g., difficulty breathing, fever, hives)     no 9. PREGNANCY: Is there any chance you are pregnant? When was your last menstrual period?     no  Protocols used: Insect  Bite-A-AH

## 2024-01-30 DIAGNOSIS — J3081 Allergic rhinitis due to animal (cat) (dog) hair and dander: Secondary | ICD-10-CM | POA: Diagnosis not present

## 2024-01-30 DIAGNOSIS — J301 Allergic rhinitis due to pollen: Secondary | ICD-10-CM | POA: Diagnosis not present

## 2024-01-30 DIAGNOSIS — J3089 Other allergic rhinitis: Secondary | ICD-10-CM | POA: Diagnosis not present

## 2024-02-02 ENCOUNTER — Ambulatory Visit: Admitting: Psychology

## 2024-02-02 DIAGNOSIS — F4323 Adjustment disorder with mixed anxiety and depressed mood: Secondary | ICD-10-CM | POA: Diagnosis not present

## 2024-02-02 DIAGNOSIS — F4322 Adjustment disorder with anxiety: Secondary | ICD-10-CM

## 2024-02-02 DIAGNOSIS — F4321 Adjustment disorder with depressed mood: Secondary | ICD-10-CM

## 2024-02-02 NOTE — Progress Notes (Signed)
 [facility] Counselor/Therapist Progress Note  Patient ID: SUSANN LAWHORNE, MRN: [id number],    Date: 02/02/2024  Time Spent: 2:32pm-3:27pm     Treatment Type: Individual Therapy   pt is seen for an in person visit  Reported Symptoms:  pt reported feeling encouraged.  Pt reports some anxiety in past couple weeks.  Pt engaging w/ positives.    Mental Status Exam: Appearance:  Well Groomed     Behavior: Appropriate  Motor: Normal and n/a  Speech/Language:  Clear and Coherent  Affect: Appropriate  Mood: anxious- some  Thought process: normal  Thought content:   WNL  Sensory/Perceptual disturbances:   WNL  Orientation: oriented to person, place, time/date, and situation  Attention: Good  Concentration: Good  Memory: WNL  Fund of knowledge:  Good  Insight:   Good  Judgment:  Good  Impulse Control: Good   Risk Assessment: Danger to Self:  No Self-injurious Behavior: No Danger to Others: No Duty to Warn:no Physical Aggression / Violence:No  Access to Firearms a concern: No  Gang Involvement:No   Subjective: Counselor assessed pt current functioning per pt report.  Processed w/ pt recent positives, stressor and emotions.  Reflected triggers for emotions and ways pt coping.  Explored self talk and how being encouraged and supported by others.   Pt affect wnl.  Pt reports she has been less anxious over past couple weeks.  Pt discussed grieving.  Pt reports on stressors and triggers and how she has felt coped w/ and not reacted to.  Pt discussed excitement and nervousness w/ upcoming program as cancer survivor and feeling supported in being able to do.     Interventions: Cognitive Behavioral Therapy, Assertiveness/Communication, and supportive  Diagnosis:Adjustment disorder with anxiety  Grief  Plan: Pt f/u in 2-3 weeks for counseling.  Pt to f/u a scheduled w/ PCP.       Individualized Treatment Plan Strengths: writing her own book about her  experience,  moving into home bought, her faith, enjoys shopping, joined a dance team  Supports: police friends, daughters, friends, spiritual mentors    Goal/Needs for Treatment:  In order of importance to patient 1) increase appropriate expression of feelings.   2) cope w/ stressors 3) ---    Client Statement of Needs:  I still need to work towards finding me, I can still question myself and what I am doing and want to be able to accept myself.  Continue to creat safe space to talk.     Treatment Level: outpt counseling  Symptoms: feeling anxious/on edge, grief  Client Treatment Preferences:biweekly counseling. Continue med management w/ PCP    Healthcare consumer's goal for treatment:   Counselor, Damien Herald, [facility] will support the patient's ability to achieve the goals identified. Cognitive Behavioral Therapy, Assertive Communication/Conflict Resolution Training, Relaxation Training, ACT, Humanistic and other evidenced-based practices will be used to promote progress towards healthy functioning.    Healthcare consumer will: Actively participate in therapy, working towards healthy functioning.     *Justification for Continuation/Discontinuation of Goal: R=Revised, O=Ongoing, A=Achieved, D=Discontinued   Goal 1) Increase pt verbal expression of feelings, grief and emotions. Baseline date 09/18/23: Progress towards goal 50; How Often - Daily Target Date Goal Was reviewed Status Code Progress towards goal/Likert rating  09/17/24                            Goal 2) Increase self care and  coping skills daily to manage stress to reduce anxiety and irritability AEB Pt report and therapist observation. Baseline date 09/18/23: Progress towards goal 50; How Often - Daily Target Date Goal Was reviewed Status Code Progress towards goal  09/17/24                            Goal 3) Increase self discovery, awareness of self, values and goals AEB Pt report and therapist observation. Baseline  date 09/18/23: Progress towards goal 25; How Often - Daily Target Date Goal Was reviewed Status Code Progress towards goal  09/17/24                            This plan has been reviewed and created by the following participants:  This plan will be reviewed at least every 12 months. Date Behavioral Health Clinician Date Guardian/Patient   09/18/23          [facility] Barbarann [facility] 09/18/23 Verbal Consent Provided and electronic signature requested       10/07/23  Electronic signature completed by pt                [place], LCMHC

## 2024-02-06 ENCOUNTER — Other Ambulatory Visit (HOSPITAL_COMMUNITY): Payer: Self-pay

## 2024-02-06 ENCOUNTER — Other Ambulatory Visit: Payer: Self-pay

## 2024-02-06 DIAGNOSIS — J3081 Allergic rhinitis due to animal (cat) (dog) hair and dander: Secondary | ICD-10-CM | POA: Diagnosis not present

## 2024-02-06 DIAGNOSIS — J301 Allergic rhinitis due to pollen: Secondary | ICD-10-CM | POA: Diagnosis not present

## 2024-02-06 DIAGNOSIS — J3089 Other allergic rhinitis: Secondary | ICD-10-CM | POA: Diagnosis not present

## 2024-02-16 ENCOUNTER — Ambulatory Visit: Admitting: Psychology

## 2024-02-16 DIAGNOSIS — F4323 Adjustment disorder with mixed anxiety and depressed mood: Secondary | ICD-10-CM

## 2024-02-16 DIAGNOSIS — F4322 Adjustment disorder with anxiety: Secondary | ICD-10-CM

## 2024-02-16 DIAGNOSIS — F4321 Adjustment disorder with depressed mood: Secondary | ICD-10-CM

## 2024-02-16 NOTE — Progress Notes (Unsigned)
 [facility] Counselor/Therapist Progress Note  Patient ID: Tracey Huff, MRN: [id number],    Date: 02/16/2024  Time Spent: 4:31pm-5:13pm     Treatment Type: Individual Therapy   Tracey Huff is seen for an in person visit  Reported Symptoms:  Tracey Huff reported less anxiety.  Tracey Huff reports feeling confident about upcoming program.   Mental Status Exam: Appearance:  Well Groomed     Behavior: Appropriate  Motor: Normal  Speech/Language:  Clear and Coherent  Affect: Appropriate  Mood: normal  Thought process: normal  Thought content:   WNL  Sensory/Perceptual disturbances:   WNL  Orientation: oriented to person, place, time/date, and situation  Attention: Good  Concentration: Good  Memory: WNL  Fund of knowledge:  Good  Insight:   Good  Judgment:  Good  Impulse Control: Good   Risk Assessment: Danger to Self:  No Self-injurious Behavior: No Danger to Others: No Duty to Warn:no Physical Aggression / Violence:No  Access to Firearms a concern: No  Gang Involvement:No   Subjective: Counselor assessed Tracey Huff current functioning per Tracey Huff report.  Processed w/ Tracey Huff recent positives and stressors.  Reflected positives of how coped w/ stressors related to daughter and conflict management.  Discussed upcoming program, support received and positive self talk to encouraged self w/ potential anxiety.  Tracey Huff affect bright.  Tracey Huff reports her mood has been good.  Tracey Huff reports stressor of daughter and church involved that began isolating from family.  Tracey Huff reports intervened w/out escalating and daughter has come to live w/ her. Tracey Huff reports that felt good w/ how handled.  Tracey Huff reports she is ready for her upcoming program this weekend and feeling confidence about.  Tracey Huff discussed ways to continue to encourage as public speaking anxiety may increase as apporaches.      Interventions: Cognitive Behavioral Therapy, Assertiveness/Communication, and supportive  Diagnosis:Adjustment disorder with  anxiety  Grief  Plan: Tracey Huff f/u in 2-3 weeks for counseling.  Tracey Huff to f/u a scheduled w/ PCP.       Individualized Treatment Plan Strengths: writing her own book about her experience,  moving into home bought, her faith, enjoys shopping, joined a dance team  Supports: police friends, daughters, friends, spiritual mentors    Goal/Needs for Treatment:  In order of importance to patient 1) increase appropriate expression of feelings.   2) cope w/ stressors 3) ---    Client Statement of Needs:  I still need to work towards finding me, I can still question myself and what I am doing and want to be able to accept myself.  Continue to creat safe space to talk.     Treatment Level: outpt counseling  Symptoms: feeling anxious/on edge, grief  Client Treatment Preferences:biweekly counseling. Continue med management w/ PCP    Healthcare consumer's goal for treatment:   Counselor, Damien Herald, [facility] will support the patient's ability to achieve the goals identified. Cognitive Behavioral Therapy, Assertive Communication/Conflict Resolution Training, Relaxation Training, ACT, Humanistic and other evidenced-based practices will be used to promote progress towards healthy functioning.    Healthcare consumer will: Actively participate in therapy, working towards healthy functioning.     *Justification for Continuation/Discontinuation of Goal: R=Revised, O=Ongoing, A=Achieved, D=Discontinued   Goal 1) Increase Tracey Huff verbal expression of feelings, grief and emotions. Baseline date 09/18/23: Progress towards goal 50; How Often - Daily Target Date Goal Was reviewed Status Code Progress towards goal/Likert rating  09/17/24  Goal 2) Increase self care and coping skills daily to manage stress to reduce anxiety and irritability AEB Tracey Huff report and therapist observation. Baseline date 09/18/23: Progress towards goal 50; How Often - Daily Target Date Goal Was reviewed Status Code  Progress towards goal  09/17/24                            Goal 3) Increase self discovery, awareness of self, values and goals AEB Tracey Huff report and therapist observation. Baseline date 09/18/23: Progress towards goal 25; How Often - Daily Target Date Goal Was reviewed Status Code Progress towards goal  09/17/24                            This plan has been reviewed and created by the following participants:  This plan will be reviewed at least every 12 months. Date Behavioral Health Clinician Date Guardian/Patient   09/18/23          [facility] Barbarann [facility] 09/18/23 Verbal Consent Provided and electronic signature requested       10/07/23  Electronic signature completed by Tracey Huff               [place], LCMHC

## 2024-02-19 ENCOUNTER — Other Ambulatory Visit (HOSPITAL_COMMUNITY): Payer: Self-pay

## 2024-02-19 DIAGNOSIS — H1045 Other chronic allergic conjunctivitis: Secondary | ICD-10-CM | POA: Diagnosis not present

## 2024-02-19 DIAGNOSIS — J3089 Other allergic rhinitis: Secondary | ICD-10-CM | POA: Diagnosis not present

## 2024-02-19 DIAGNOSIS — J3081 Allergic rhinitis due to animal (cat) (dog) hair and dander: Secondary | ICD-10-CM | POA: Diagnosis not present

## 2024-02-19 DIAGNOSIS — J4530 Mild persistent asthma, uncomplicated: Secondary | ICD-10-CM | POA: Diagnosis not present

## 2024-02-19 DIAGNOSIS — J301 Allergic rhinitis due to pollen: Secondary | ICD-10-CM | POA: Diagnosis not present

## 2024-02-19 MED ORDER — ALBUTEROL SULFATE HFA 108 (90 BASE) MCG/ACT IN AERS
1.0000 | INHALATION_SPRAY | RESPIRATORY_TRACT | 0 refills | Status: DC
  Filled 2024-02-19: qty 6.7, 30d supply, fill #0

## 2024-02-19 MED ORDER — OLOPATADINE HCL 0.2 % OP SOLN
1.0000 [drp] | Freq: Every day | OPHTHALMIC | 6 refills | Status: DC
  Filled 2024-02-19: qty 2.5, 50d supply, fill #0

## 2024-02-19 MED ORDER — FLUTICASONE PROPIONATE 50 MCG/ACT NA SUSP
1.0000 | NASAL | 6 refills | Status: DC
  Filled 2024-02-19: qty 16, 30d supply, fill #0

## 2024-02-19 MED ORDER — LEVOCETIRIZINE DIHYDROCHLORIDE 5 MG PO TABS
5.0000 mg | ORAL_TABLET | Freq: Every day | ORAL | 6 refills | Status: DC
  Filled 2024-02-19: qty 30, 30d supply, fill #0

## 2024-02-27 DIAGNOSIS — J3081 Allergic rhinitis due to animal (cat) (dog) hair and dander: Secondary | ICD-10-CM | POA: Diagnosis not present

## 2024-02-27 DIAGNOSIS — J301 Allergic rhinitis due to pollen: Secondary | ICD-10-CM | POA: Diagnosis not present

## 2024-02-27 DIAGNOSIS — J3089 Other allergic rhinitis: Secondary | ICD-10-CM | POA: Diagnosis not present

## 2024-03-01 ENCOUNTER — Ambulatory Visit: Admitting: Psychology

## 2024-03-01 DIAGNOSIS — F4323 Adjustment disorder with mixed anxiety and depressed mood: Secondary | ICD-10-CM | POA: Diagnosis not present

## 2024-03-01 DIAGNOSIS — F4322 Adjustment disorder with anxiety: Secondary | ICD-10-CM

## 2024-03-01 DIAGNOSIS — F4321 Adjustment disorder with depressed mood: Secondary | ICD-10-CM

## 2024-03-01 NOTE — Progress Notes (Signed)
 [facility] Counselor/Therapist Progress Note  Patient ID: Tracey Huff, MRN: [id number],    Date: 03/01/2024  Time Spent: 2:35pm-3:32pm     Treatment Type: Individual Therapy   pt is seen for an in person visit  Reported Symptoms:  pt reported confidence and proud of self w/ recent talk.  Pt reports anxiety/irritability today w/ interaction at work.    Mental Status Exam: Appearance:  Well Groomed     Behavior: Appropriate  Motor: Normal  Speech/Language:  Clear and Coherent  Affect: Appropriate  Mood: anxious  Thought process: normal  Thought content:   WNL  Sensory/Perceptual disturbances:   WNL  Orientation: oriented to person, place, time/date, and situation  Attention: Good  Concentration: Good  Memory: WNL  Fund of knowledge:  Good  Insight:   Good  Judgment:  Good  Impulse Control: Good   Risk Assessment: Danger to Self:  No Self-injurious Behavior: No Danger to Others: No Duty to Warn:no Physical Aggression / Violence:No  Access to Firearms a concern: No  Gang Involvement:No   Subjective: Counselor assessed pt current functioning per pt report.  Processed w/ pt recent positives and stressors and emotions.  Reflected positives w/ her program and how encouraged for another program.  Explored interaction at work and how heightened anxiety and how was trigger for her.  Reflected use of coping skills and supports to  manage through.   Pt affect bright.  Pt reports that her talk was very positive and turned out well and w/ a good attendance.  Pt reports that she has felt encouraged and receiving encouragement for another program and plans this one on grief.  Pt reports that she was upset at work today w/ coworker demanding of her and not giving her space to problem solve.  Pt was able to gain insight into how similar to experience when caring for her deceased husband.  Pt reflected on support of another doctor, general practice.  Pt discussed how  she was able to take space and ground and deescalate.  Pt discussed some relationship and difficulty w/ trust.  Pt receptive to give self patience.       Interventions: Cognitive Behavioral Therapy, Assertiveness/Communication, and supportive  Diagnosis:Adjustment disorder with anxiety  Grief  Plan: Pt f/u in 2-3 weeks for counseling.  Pt to f/u a scheduled w/ PCP.       Individualized Treatment Plan Strengths: writing her own book about her experience,  moving into home bought, her faith, enjoys shopping, joined a dance team  Supports: police friends, daughters, friends, spiritual mentors    Goal/Needs for Treatment:  In order of importance to patient 1) increase appropriate expression of feelings.   2) cope w/ stressors 3) ---    Client Statement of Needs:  I still need to work towards finding me, I can still question myself and what I am doing and want to be able to accept myself.  Continue to creat safe space to talk.     Treatment Level: outpt counseling  Symptoms: feeling anxious/on edge, grief  Client Treatment Preferences:biweekly counseling. Continue med management w/ PCP    Healthcare consumer's goal for treatment:   Counselor, Damien Herald, [facility] will support the patient's ability to achieve the goals identified. Cognitive Behavioral Therapy, Assertive Communication/Conflict Resolution Training, Relaxation Training, ACT, Humanistic and other evidenced-based practices will be used to promote progress towards healthy functioning.    Healthcare consumer will: Actively participate in therapy, working towards healthy  functioning.     *Justification for Continuation/Discontinuation of Goal: R=Revised, O=Ongoing, A=Achieved, D=Discontinued   Goal 1) Increase pt verbal expression of feelings, grief and emotions. Baseline date 09/18/23: Progress towards goal 50; How Often - Daily Target Date Goal Was reviewed Status Code Progress towards goal/Likert rating  09/17/24                             Goal 2) Increase self care and coping skills daily to manage stress to reduce anxiety and irritability AEB Pt report and therapist observation. Baseline date 09/18/23: Progress towards goal 50; How Often - Daily Target Date Goal Was reviewed Status Code Progress towards goal  09/17/24                            Goal 3) Increase self discovery, awareness of self, values and goals AEB Pt report and therapist observation. Baseline date 09/18/23: Progress towards goal 25; How Often - Daily Target Date Goal Was reviewed Status Code Progress towards goal  09/17/24                            This plan has been reviewed and created by the following participants:  This plan will be reviewed at least every 12 months. Date Behavioral Health Clinician Date Guardian/Patient   09/18/23          [facility] Barbarann [facility] 09/18/23 Verbal Consent Provided and electronic signature requested       10/07/23  Electronic signature completed by pt             [place], LCMHC

## 2024-03-04 ENCOUNTER — Other Ambulatory Visit: Payer: Self-pay | Admitting: Nurse Practitioner

## 2024-03-04 DIAGNOSIS — F411 Generalized anxiety disorder: Secondary | ICD-10-CM

## 2024-03-05 ENCOUNTER — Other Ambulatory Visit (HOSPITAL_COMMUNITY): Payer: Self-pay

## 2024-03-05 DIAGNOSIS — J3081 Allergic rhinitis due to animal (cat) (dog) hair and dander: Secondary | ICD-10-CM | POA: Diagnosis not present

## 2024-03-05 DIAGNOSIS — J3089 Other allergic rhinitis: Secondary | ICD-10-CM | POA: Diagnosis not present

## 2024-03-05 DIAGNOSIS — J301 Allergic rhinitis due to pollen: Secondary | ICD-10-CM | POA: Diagnosis not present

## 2024-03-05 MED ORDER — BUSPIRONE HCL 10 MG PO TABS
10.0000 mg | ORAL_TABLET | Freq: Two times a day (BID) | ORAL | 2 refills | Status: AC | PRN
  Filled 2024-03-05: qty 180, 90d supply, fill #0

## 2024-03-05 NOTE — Telephone Encounter (Signed)
 Requested Prescriptions  Pending Prescriptions Disp Refills   busPIRone  (BUSPAR ) 10 MG tablet 180 tablet 2    Sig: Take 1 tablet (10 mg total) by mouth 2 (two) times daily as needed (anxiety).     Psychiatry: Anxiolytics/Hypnotics - Non-controlled Passed - 03/05/2024  4:21 PM      Passed - Valid encounter within last 12 months    Recent Outpatient Visits           1 month ago Insect bite of right ankle, initial encounter   [facility] Gareth Mliss FALCON, FNP   5 months ago Annual physical exam   [facility] Gareth Mliss FALCON, FNP   6 months ago Immunization due   [facility] Gareth Mliss FALCON, FNP       Future Appointments             In 6 days Hester Alm BROCKS, MD [facility]   In 7 months Gareth, Mliss FALCON, FNP [facility] [place]

## 2024-03-10 DIAGNOSIS — J301 Allergic rhinitis due to pollen: Secondary | ICD-10-CM | POA: Diagnosis not present

## 2024-03-10 DIAGNOSIS — J3089 Other allergic rhinitis: Secondary | ICD-10-CM | POA: Diagnosis not present

## 2024-03-10 DIAGNOSIS — J3081 Allergic rhinitis due to animal (cat) (dog) hair and dander: Secondary | ICD-10-CM | POA: Diagnosis not present

## 2024-03-11 ENCOUNTER — Ambulatory Visit: Payer: Commercial Managed Care - PPO | Admitting: Dermatology

## 2024-03-11 ENCOUNTER — Other Ambulatory Visit (HOSPITAL_COMMUNITY): Payer: Self-pay

## 2024-03-11 DIAGNOSIS — R21 Rash and other nonspecific skin eruption: Secondary | ICD-10-CM | POA: Diagnosis not present

## 2024-03-11 DIAGNOSIS — Z79899 Other long term (current) drug therapy: Secondary | ICD-10-CM

## 2024-03-11 DIAGNOSIS — L820 Inflamed seborrheic keratosis: Secondary | ICD-10-CM | POA: Diagnosis not present

## 2024-03-11 DIAGNOSIS — L821 Other seborrheic keratosis: Secondary | ICD-10-CM

## 2024-03-11 DIAGNOSIS — L258 Unspecified contact dermatitis due to other agents: Secondary | ICD-10-CM

## 2024-03-11 DIAGNOSIS — K130 Diseases of lips: Secondary | ICD-10-CM | POA: Diagnosis not present

## 2024-03-11 DIAGNOSIS — L819 Disorder of pigmentation, unspecified: Secondary | ICD-10-CM | POA: Diagnosis not present

## 2024-03-11 DIAGNOSIS — Z7189 Other specified counseling: Secondary | ICD-10-CM

## 2024-03-11 MED ORDER — OPZELURA 1.5 % EX CREA
1.0000 | TOPICAL_CREAM | Freq: Two times a day (BID) | CUTANEOUS | 11 refills | Status: AC
  Filled 2024-03-11: qty 60, 30d supply, fill #0

## 2024-03-11 MED ORDER — RHAPSIDO 25 MG PO TABS: 1.0000 | ORAL_TABLET | Freq: Two times a day (BID) | ORAL | 11 refills | Status: DC

## 2024-03-11 NOTE — Patient Instructions (Addendum)
 Seborrheic Keratosis  What causes seborrheic keratoses? Seborrheic keratoses are harmless, common skin growths that first appear during adult life.  As time goes by, more growths appear.  Some people may develop a large number of them.  Seborrheic keratoses appear on both covered and uncovered body parts.  They are not caused by sunlight.  The tendency to develop seborrheic keratoses can be inherited.  They vary in color from skin-colored to gray, brown, or even black.  They can be either smooth or have a rough, warty surface.   Seborrheic keratoses are superficial and look as if they were stuck on the skin.  Under the microscope this type of keratosis looks like layers upon layers of skin.  That is why at times the top layer may seem to fall off, but the rest of the growth remains and re-grows.    Treatment Seborrheic keratoses do not need to be treated, but can easily be removed in the office.  Seborrheic keratoses often cause symptoms when they rub on clothing or jewelry.  Lesions can be in the way of shaving.  If they become inflamed, they can cause itching, soreness, or burning.  Removal of a seborrheic keratosis can be accomplished by freezing, burning, or surgery. If any spot bleeds, scabs, or grows rapidly, please return to have it checked, as these can be an indication of a skin cancer.  Due to recent changes in healthcare laws, you may see results of your pathology and/or laboratory studies on MyChart before the doctors have had a chance to review them. We understand that in some cases there may be results that are confusing or concerning to you. Please understand that not all results are received at the same time and often the doctors may need to interpret multiple results in order to provide you with the best plan of care or course of treatment. Therefore, we ask that you please give us  2 business days to thoroughly review all your results before contacting the office for clarification. Should  we see a critical lab result, you will be contacted sooner.   If You Need Anything After Your Visit  If you have any questions or concerns for your doctor, please call our main line at [phone] and press option 4 to reach your doctor's medical assistant. If no one answers, please leave a voicemail as directed and we will return your call as soon as possible. Messages left after 4 pm will be answered the following business day.   You may also send us  a message via MyChart. We typically respond to MyChart messages within 1-2 business days.  For prescription refills, please ask your pharmacy to contact our office. Our fax number is [phone].  If you have an urgent issue when the clinic is closed that cannot wait until the next business day, you can page your doctor at the number below.    Please note that while we do our best to be available for urgent issues outside of office hours, we are not available 24/7.   If you have an urgent issue and are unable to reach us , you may choose to seek medical care at your doctor's office, retail clinic, urgent care center, or emergency room.  If you have a medical emergency, please immediately call 911 or go to the emergency department.  Pager Numbers  - Dr. Hester: [phone]  - Dr. Jackquline: [phone]  - Dr. Claudene: [phone]   - Dr. Raymund: [phone]  In the event of inclement weather,  please call our main line at [phone] for an update on the status of any delays or closures.  Dermatology Medication Tips: Please keep the boxes that topical medications come in in order to help keep track of the instructions about where and how to use these. Pharmacies typically print the medication instructions only on the boxes and not directly on the medication tubes.   If your medication is too expensive, please contact our office at [phone] option 4 or send us  a message through MyChart.   We are unable to tell what your co-pay  for medications will be in advance as this is different depending on your insurance coverage. However, we may be able to find a substitute medication at lower cost or fill out paperwork to get insurance to cover a needed medication.   If a prior authorization is required to get your medication covered by your insurance company, please allow us  1-2 business days to complete this process.  Drug prices often vary depending on where the prescription is filled and some pharmacies may offer cheaper prices.  The website www.goodrx.com contains coupons for medications through different pharmacies. The prices here do not account for what the cost may be with help from insurance (it may be cheaper with your insurance), but the website can give you the price if you did not use any insurance.  - You can print the associated coupon and take it with your prescription to the pharmacy.  - You may also stop by our office during regular business hours and pick up a GoodRx coupon card.  - If you need your prescription sent electronically to a different pharmacy, notify our office through [facility] or by phone at [phone] option 4.     Si Usted Necesita Algo Despus de Su Visita  Tambin puede enviarnos un mensaje a travs de Clinical cytogeneticist. Por lo general respondemos a los mensajes de MyChart en el transcurso de 1 a 2 das hbiles.  Para renovar recetas, por favor pida a su farmacia que se ponga en contacto con nuestra oficina. Randi lakes de fax es [place] [phone].  Si tiene un asunto urgente cuando la clnica est cerrada y que no puede esperar hasta el siguiente da hbil, puede llamar/localizar a su doctor(a) al nmero que aparece a continuacin.   Por favor, tenga en cuenta que aunque hacemos todo lo posible para estar disponibles para asuntos urgentes fuera del horario de [place], no estamos disponibles las 24 horas del da, los 7 [address]  [address] de la [place].   Si tiene un problema urgente y no puede  comunicarse con nosotros, puede optar por buscar atencin mdica  en el consultorio de su doctor(a), en una clnica privada, en un centro de atencin urgente o en una sala de emergencias.  Si tiene Engineer, drilling, por favor llame inmediatamente al 911 o vaya a la sala de emergencias.  Nmeros de bper  - Dr. Hester: [phone]  - Dra. Jackquline: [phone]  - Dr. Claudene: [phone]  - Dra. Kitts: [phone]  En caso de inclemencias del [place], por favor llame a nuestra lnea principal al [phone] para una actualizacin sobre el estado de cualquier retraso o cierre.  Consejos para la medicacin en dermatologa: Por favor, guarde las cajas en las que vienen los medicamentos de uso tpico para ayudarle a seguir las instrucciones sobre dnde y cmo usarlos. Las farmacias generalmente imprimen las instrucciones del medicamento slo en las cajas y no directamente en los tubos del [place].   Si  su medicamento es muy caro, por favor, pngase en contacto con landry rieger llamando al [phone] y presione la opcin 4 o envenos un mensaje a travs de Clinical cytogeneticist.   No podemos decirle cul ser su copago por los medicamentos por adelantado ya que esto es diferente dependiendo de la cobertura de su seguro. Sin embargo, es posible que podamos encontrar un medicamento sustituto a Audiological scientist un formulario para que el seguro cubra el medicamento que se considera necesario.   Si se requiere una autorizacin previa para que su compaa de seguros malta su medicamento, por favor permtanos de 1 a 2 das hbiles para completar este proceso.  Los precios de los medicamentos varan con frecuencia dependiendo del Environmental consultant de dnde se surte la receta y alguna farmacias pueden ofrecer precios ms baratos.  El sitio web www.goodrx.com tiene cupones para medicamentos de Health and safety inspector. Los precios aqu no tienen en cuenta lo que podra costar con la ayuda del seguro (puede ser ms  barato con su seguro), pero el sitio web puede darle el precio si no utiliz Tourist information centre manager.  - Puede imprimir el cupn correspondiente y llevarlo con su receta a la farmacia.  - Tambin puede pasar por nuestra oficina durante el horario de atencin regular y Education officer, museum una tarjeta de cupones de GoodRx.  - Si necesita que su receta se enve electrnicamente a una farmacia diferente, informe a nuestra oficina a travs de MyChart de  o por telfono llamando al [phone] y presione la opcin 4.

## 2024-03-11 NOTE — Progress Notes (Signed)
 Follow-Up Visit   Subjective  Tracey Huff is a 53 y.o. female who presents for the following: rash follow up, reports rash could be caused by FIT testing.  Also would like to discuss isks  at face and neck  The following portions of the chart were reviewed this encounter and updated as appropriate: medications, allergies, medical history  Review of Systems:  No other skin or systemic complaints except as noted in HPI or Assessment and Plan.  Objective  Well appearing patient in no apparent distress; mood and affect are within normal limits.  A focused examination was performed of the following areas: Face, neck  Relevant exam findings are noted in the Assessment and Plan.  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 Chronic and persistent condition with duration or expected duration over one year. Condition is improving with treatment but not currently at goal.  Exam:Slight dyschromia  See photos   Differential diagnosis:  Allergic contact dermatitis likely FIT Testing / foods / etc.    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   History of  allergy testing prior that showed positive allergy  to trees, grass, weed, ragweed, mold, dust mites, cockroach, dog, cat, feathers. Negative to walnut, peanut, pecan, cashew, almond, brazil nut, wheat, milk, egg, trout, oyster, shrimp, flounder, tuna, clam and catfish, crab and soy. H/o of breaking out in hives. Currently on tamoxifen .   Patient seen by allergist Dr. Luke at [facility] on 11/13/2022 and here were her recommendations  - Did not recommend RAST testing  - Recommended to avoid spicy foods and acidic foods as those tend to irritate sensitive skin. - Recommended to take pictures when rash/swelling - Recommended to start Xyzal  5 mg at night to help with itching  - Recommend Avoiding the following potential triggers: alcohol, tight clothing, NSAIDs, hot showers and getting overheated. See below for proper skin care.  Use fragrance free and dye free products. Don't use mint products.  Try kids flouride free toothpaste.    Treatment Plan:  My recommendations today are to : Recommend RAST testing in future if Dr. Frutoso feels appropriate   Start Rhapsido 25 mg - 1 po bid  Samples given  Lot [id number] Exp 09/02/2025 [employer] Patient Support Start Form filled out Rx sent to [facility] Continue Opzelura  cream to aa's BID.  Continue Xyzal  5 mg  po qhs as prescribed   Discussed as a possible side effect from taking Rhapsido  Fordyce spots are small, harmless, yellowish-white bumps that are actually misplaced sebaceous glands, commonly found on the lips  Discussed Xolair and Dupixent as other options that could be considered in the future    SEBORRHEIC KERATOSIS - Stuck-on, waxy, tan-brown papules and/or plaques  - Benign-appearing - Discussed benign etiology and prognosis. - Observe - Call for any changes  INFLAMED SEBORRHEIC KERATOSIS (18) face and neck x 17 (17), right lower eyelid margin x 1 Symptomatic, irritating, patient would like treated. Destruction of lesion - face and  neck x 17 (17) Complexity: simple   Destruction method comment:  Electrodesiccation Informed consent: discussed and consent obtained   Timeout:  patient name, date of birth, surgical site, and procedure verified Procedure prep:  Patient was prepped and draped in usual sterile fashion Prep type:  Isopropyl alcohol Anesthesia: the lesion was anesthetized in a  standard fashion   Anesthetic:  1% lidocaine  w/ epinephrine  1-100,000 buffered w/ 8.4% NaHCO3 Hemostasis achieved with:  electrodesiccation Outcome: patient tolerated procedure well with no complications   Post-procedure details: wound care instructions given    CHEILITIS   Related Medications Ruxolitinib  Phosphate (OPZELURA ) 1.5 % CREA Apply 1 application topically as directed 1- 2  times daily. RASH   Related Medications Ruxolitinib  Phosphate (OPZELURA ) 1.5 % CREA Apply 1 application topically as directed 1- 2  times daily. Remibrutinib (RHAPSIDO) 25 MG TABS Take 1 tablet by mouth in the morning and at bedtime. For rash  Return in about 1 year (around 03/11/2025) for follow up on rash and isks .  IEleanor Blush, CMA, am acting as scribe for Alm Rhyme, MD.   Documentation: I have reviewed the above documentation for accuracy and completeness, and I agree with the above.  Alm Rhyme, MD

## 2024-03-12 DIAGNOSIS — J3089 Other allergic rhinitis: Secondary | ICD-10-CM | POA: Diagnosis not present

## 2024-03-12 DIAGNOSIS — J3081 Allergic rhinitis due to animal (cat) (dog) hair and dander: Secondary | ICD-10-CM | POA: Diagnosis not present

## 2024-03-12 DIAGNOSIS — J301 Allergic rhinitis due to pollen: Secondary | ICD-10-CM | POA: Diagnosis not present

## 2024-03-16 ENCOUNTER — Encounter: Payer: Self-pay | Admitting: Dermatology

## 2024-03-18 ENCOUNTER — Ambulatory Visit: Admitting: Psychology

## 2024-03-18 DIAGNOSIS — F4321 Adjustment disorder with depressed mood: Secondary | ICD-10-CM | POA: Diagnosis not present

## 2024-03-18 DIAGNOSIS — F4322 Adjustment disorder with anxiety: Secondary | ICD-10-CM | POA: Diagnosis not present

## 2024-03-18 NOTE — Progress Notes (Signed)
 [facility] Counselor/Therapist Progress Note  Patient ID: Tracey Huff, MRN: [id number],    Date: 03/18/2024  Time Spent: 1:45pm-2:40pm     Treatment Type: Individual Therapy   pt is seen for an in person visit  Reported Symptoms:  pt reported some worry, but overall feeling positive and increasing confidence.      Mental Status Exam: Appearance:  Well Groomed     Behavior: Appropriate  Motor: Normal  Speech/Language:  Clear and Coherent  Affect: Appropriate  Mood: anxious  Thought process: normal  Thought content:   WNL  Sensory/Perceptual disturbances:   WNL  Orientation: oriented to person, place, time/date, and situation  Attention: Good  Concentration: Good  Memory: WNL  Fund of knowledge:  Good  Insight:   Good  Judgment:  Good  Impulse Control: Good   Risk Assessment: Danger to Self:  No Self-injurious Behavior: No Danger to Others: No Duty to Warn:no Physical Aggression / Violence:No  Access to Firearms a concern: No  Gang Involvement:No   Subjective: Counselor assessed pt current functioning per pt report.  Processed w/ pt recent positives and stressors.  Explored emotions and how responding to stressors.  Reflected pt reframing and change in mindset.  Discussed positives w/ program planning and supports.    Pt affect wnl.  Pt reports that she still having some worry w/ some gossip being spread at work.  Pt discussed boundaries she is maintaining.   Pt reports she had a barrier re: location used for previous community program and how initially took personal as against her.  Pt was able to reframe and exploring other options for program.  Pt reports she is feeling confident about using her voice and story to support others.  Pt discussed trust issues w/ developing relationships and recent interactions.     Interventions: Cognitive Behavioral Therapy, Assertiveness/Communication, and supportive  Diagnosis:Adjustment disorder with  anxiety  Grief  Plan: Pt f/u in 2-3 weeks for counseling.  Pt to f/u a scheduled w/ PCP.       Individualized Treatment Plan Strengths: writing her own book about her experience,  moving into home bought, her faith, enjoys shopping, joined a dance team  Supports: police friends, daughters, friends, spiritual mentors    Goal/Needs for Treatment:  In order of importance to patient 1) increase appropriate expression of feelings.   2) cope w/ stressors 3) ---    Client Statement of Needs:  I still need to work towards finding me, I can still question myself and what I am doing and want to be able to accept myself.  Continue to creat safe space to talk.     Treatment Level: outpt counseling  Symptoms: feeling anxious/on edge, grief  Client Treatment Preferences:biweekly counseling. Continue med management w/ PCP    Healthcare consumer's goal for treatment:   Counselor, Damien Herald, [facility] will support the patient's ability to achieve the goals identified. Cognitive Behavioral Therapy, Assertive Communication/Conflict Resolution Training, Relaxation Training, ACT, Humanistic and other evidenced-based practices will be used to promote progress towards healthy functioning.    Healthcare consumer will: Actively participate in therapy, working towards healthy functioning.     *Justification for Continuation/Discontinuation of Goal: R=Revised, O=Ongoing, A=Achieved, D=Discontinued   Goal 1) Increase pt verbal expression of feelings, grief and emotions. Baseline date 09/18/23: Progress towards goal 50; How Often - Daily Target Date Goal Was reviewed Status Code Progress towards goal/Likert rating  09/17/24  Goal 2) Increase self care and coping skills daily to manage stress to reduce anxiety and irritability AEB Pt report and therapist observation. Baseline date 09/18/23: Progress towards goal 50; How Often - Daily Target Date Goal Was reviewed Status Code  Progress towards goal  09/17/24                            Goal 3) Increase self discovery, awareness of self, values and goals AEB Pt report and therapist observation. Baseline date 09/18/23: Progress towards goal 25; How Often - Daily Target Date Goal Was reviewed Status Code Progress towards goal  09/17/24                            This plan has been reviewed and created by the following participants:  This plan will be reviewed at least every 12 months. Date Behavioral Health Clinician Date Guardian/Patient   09/18/23          [facility] 09/18/23 Verbal Consent Provided and electronic signature requested       10/07/23  Electronic signature completed by pt                  [place], LCMHC

## 2024-03-19 DIAGNOSIS — J3081 Allergic rhinitis due to animal (cat) (dog) hair and dander: Secondary | ICD-10-CM | POA: Diagnosis not present

## 2024-03-19 DIAGNOSIS — J3089 Other allergic rhinitis: Secondary | ICD-10-CM | POA: Diagnosis not present

## 2024-03-19 DIAGNOSIS — J301 Allergic rhinitis due to pollen: Secondary | ICD-10-CM | POA: Diagnosis not present

## 2024-03-24 ENCOUNTER — Ambulatory Visit: Admitting: Surgery

## 2024-03-24 ENCOUNTER — Encounter: Payer: Self-pay | Admitting: Surgery

## 2024-03-24 VITALS — BP 151/86 | HR 84 | Temp 98.6°F | Ht 60.0 in | Wt 159.4 lb

## 2024-03-24 DIAGNOSIS — Z853 Personal history of malignant neoplasm of breast: Secondary | ICD-10-CM

## 2024-03-24 NOTE — Patient Instructions (Signed)
 How to Do a Breast Self-Exam Doing breast self-exams can help you stay healthy. They're one way to know what's normal for your breasts. They can help you catch a problem while it's still small and can be treated. You need to: Check your breasts often. Tell your doctor about any changes. You should do breast self-exams even if you have breast implants. What you need: A mirror. A well-lit room. A pillow or other soft object. How to do a breast self-exam Look for changes  Take off all the clothes above your waist. Stand in front of a mirror in a room with good lighting. Put your hands down at your sides. Compare your breasts in the mirror. Look for difference between them, such as: Differences in shape. Differences in size. Wrinkles, dips, and bumps in one breast and not the other. Look at each breast for skin changes, such as: Redness. Scaly spots. Spots where your skin is thicker. Dimpling. Open sores. Look for changes in your nipples, such as: Fluid coming out of a nipple. Fluid around a nipple. Bleeding. Dimpling. Redness. A nipple that looks pushed in or that has changed position. Feel for changes Lie on your back. Feel each breast. To do this: Pick a breast to feel. Place a pillow under the shoulder closest to that breast. Put the arm closest to that breast behind your head. Feel the breast using the hand of your other arm. Use the pads of your three middle fingers to make small circles starting near the nipple. Use light, medium, and firm pressure. Keep making circles, moving down over the breast. Stop when you feel your ribs. Start making circles with your fingers again, this time going up until you reach your collarbone. Then, make circles out across your breast and into your armpit area. Squeeze your nipple. Check for fluid and lumps. Do these steps again to check your other breast. Sit or stand in the tub or shower. With soapy water on your skin, feel each breast  the same way you did when you were lying down. Write down what you find Writing down what you find can help you keep track of what you want to tell your doctor. Write down: What's normal for each breast. Any changes you find. Write down: The kind of change. If your breast feels tender or painful. Any lump you find. Write down its size and where it is. When you last had your period. General tips If you're breastfeeding, the best time to check your breasts is after you feed your baby or after you use a breast pump. If you get a period, the best time to check your breasts is 5-7 days after your period ends. With time, you'll get more used to doing the self-exam. You'll also start to know if there are changes in your breasts. Contact a doctor if: You see a change in the shape or size of your breasts or nipples. You see a change in the skin of your breast or nipples. You have fluid coming from your nipples that isn't normal. You find a new lump or thick area. You have breast pain. You have any concerns about your breast health. This information is not intended to replace advice given to you by your health care provider. Make sure you discuss any questions you have with your health care provider. Document Revised: 07/02/2023 Document Reviewed: 07/02/2023 Elsevier Patient Education  2025 [employer].

## 2024-03-24 NOTE — Progress Notes (Signed)
 Outpatient Surgical Follow Up  03/24/2024  Hadyn Azer Huff is an 53 y.o. female.   Chief Complaint  Patient presents with   Follow-up    1 yr. 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   No fevers no chills no weight loss. She lost her husband from glioblastoma multiform a couple years ago.  I did express my condolences and provided supportive therapy  Past Medical History:  Diagnosis Date   Allergy    Anxiety    Asthma    Breast cancer (HCC)    INVASIVE MAMMARY CARCINOMA- left   Family history of prostate cancer    Fibroid uterus 11/15/2014   Hyperlipidemia    Hypertension    Menorrhagia with irregular cycle 11/15/2014   Pre-diabetes    Urticaria     Past Surgical History:  Procedure Laterality Date   ABDOMINAL HYSTERECTOMY N/A 11/15/2014   Procedure: HYSTERECTOMY ABDOMINAL/BILATERAL SALPINGECTOMY ;  Surgeon: Garnette JONETTA Mace, MD;  Location: [facility] OR[facility];  Service: Gynecology;  Laterality: N/A;   BREAST BIOPSY Right    2019 negative   BREAST BIOPSY Left 03/09/2021   u/s biopsy, 12-1 o'clock, VENUS clip-path pending   BREAST LUMPECTOMY,RADIO FREQ LOCALIZER,AXILLARY SENTINEL LYMPH NODE BIOPSY Left 03/28/2021   Procedure: BREAST LUMPECTOMY,RADIO FREQ LOCALIZER,AXILLARY SENTINEL LYMPH NODE BIOPSY;  Surgeon: Jordis Laneta FALCON, MD;  Location: [facility] OR[facility];  Service: General;  Laterality: Left;   BREAST RECONSTRUCTION WITH PLACEMENT OF TISSUE EXPANDER AND FLEX HD (ACELLULAR HYDRATED DERMIS) Bilateral 05/15/2021   Procedure: BILATERAL BREAST RECONSTRUCTION WITH PLACEMENT OF TISSUE EXPANDER AND FLEX HD (ACELLULAR HYDRATED DERMIS);  Surgeon: Elisabeth Craig RAMAN, MD;  Location: [facility] OR[facility];  Service: Plastics;  Laterality: Bilateral;    CESAREAN SECTION  1990   COLONOSCOPY WITH PROPOFOL  N/A 01/04/2022   Procedure: COLONOSCOPY WITH PROPOFOL ;  Surgeon: Therisa Bi, MD;  Location: [facility];  Service: Gastroenterology;  Laterality: N/A;   COLPOSCOPY  07/28/2018   CYSTOSCOPY N/A 11/15/2014   Procedure: CYSTOSCOPY;  Surgeon: Garnette JONETTA Mace, MD;  Location: [facility];  Service: Gynecology;  Laterality: N/A;   DILATION AND CURETTAGE OF UTERUS     GANGLION CYST EXCISION Left    REMOVAL OF BILATERAL TISSUE EXPANDERS WITH PLACEMENT OF BILATERAL BREAST IMPLANTS Bilateral 10/02/2021   Procedure: REMOVAL OF BILATERAL TISSUE EXPANDERS WITH PLACEMENT OF BILATERAL BREAST IMPLANTS;  Surgeon: Elisabeth Craig RAMAN, MD;  Location: [facility];  Service: Plastics;  Laterality: Bilateral;   TOTAL MASTECTOMY Bilateral 05/15/2021   Procedure: TOTAL MASTECTOMY, Bilateral Simple Mastectomy, skin sparing;  Surgeon: Jordis Laneta FALCON, MD;  Location: [facility];  Service: General;  Laterality: Bilateral;  Provider requesting for 2 hours / 120 min for procedure.   UTERINE FIBROID SURGERY      Family History  Problem Relation Age of Onset   Allergic rhinitis Mother    Congestive Heart Failure Mother    Diabetes Mother    Hypertension Mother    Diabetes Maternal Grandmother    Congestive Heart Failure Maternal Grandfather    Breast cancer Neg Hx     Social History:  reports that she has never smoked. She has never been exposed to tobacco smoke. She has never used smokeless tobacco. She reports that she does not currently use alcohol. She reports that she  does not use drugs.  Allergies:  Allergies  Allergen Reactions   Macadamia Nut Oil Shortness Of Breath   Apple Juice Nausea And Vomiting   Brazil Nut (Berthollefia Excelsa) Nausea And Vomiting and Swelling    Tongue swelling   Carrot Oil Nausea And Vomiting    Can not cooked raw carrots   Fruit & Vegetable Daily [Nutritional Supplements] Nausea And Vomiting    Cannot tolerate apples,  peaches, plums, and nectarines   Kiwi Extract Nausea And Vomiting   Other     Lobster   Peach [Prunus Persica] Nausea And Vomiting    Medications reviewed.    ROS Full ROS performed and is otherwise negative other than what is stated in HPI   BP (!) 151/86   Pulse 84   Temp 98.6 F (37 C) (Oral)   Ht 5' (1.524 m)   Wt 159 lb 6.4 oz (72.3 kg)   LMP 11/15/2014   SpO2 99%   BMI 31.13 kg/m   Physical Exam Vitals and nursing note reviewed. Exam conducted with a chaperone present.  Constitutional:      General: She is not in acute distress.    Appearance: Normal appearance. She is not ill-appearing.  Cardiovascular:     Rate and Rhythm: Normal rate.     Heart sounds: No murmur heard.    No friction rub.  Pulmonary:     Effort: Pulmonary effort is normal.     Breath sounds: Normal breath sounds. No stridor. No wheezing.     Comments: Chest wall and breast without evidence of skin or chest wall lesions.  Bilateral axilla  free of disease Abdominal:     General: Abdomen is flat. There is no distension.     Palpations: Abdomen is soft. There is no mass.     Tenderness: There is no abdominal tenderness.     Hernia: No hernia is present.  Skin:    General: Skin is warm and dry.     Capillary Refill: Capillary refill takes less than 2 seconds.  Neurological:     General: No focal deficit present.     Mental Status: She is alert and oriented to person, place, and time.  Psychiatric:        Mood and Affect: Mood normal.        Behavior: Behavior normal.        Thought Content: Thought content normal.        Judgment: Judgment normal.     Assessment/Plan: 53 year old female with prior history of breast cancer status post bilateral mastectomies.  No evidence of chest wall recurrences.  No evidence of complications.  She is currently happy with cosmetic outcome.  She will follow-up with me on a yearly basis.  She also wants to follow-up with plastic surgery but not in a rush  to do any additional procedures   I personally spent a total of 30 minutes in the care of the patient today including performing a medically appropriate exam/evaluation, counseling and educating, placing orders, referring and communicating with other health care professionals, documenting clinical information in the EHR, independently interpreting and reviewing images studies and coordinating care.   Laneta Luna, MD [facility] Surgeon

## 2024-03-26 DIAGNOSIS — J301 Allergic rhinitis due to pollen: Secondary | ICD-10-CM | POA: Diagnosis not present

## 2024-03-26 DIAGNOSIS — J3089 Other allergic rhinitis: Secondary | ICD-10-CM | POA: Diagnosis not present

## 2024-03-26 DIAGNOSIS — J3081 Allergic rhinitis due to animal (cat) (dog) hair and dander: Secondary | ICD-10-CM | POA: Diagnosis not present

## 2024-04-12 ENCOUNTER — Telehealth: Payer: Self-pay

## 2024-04-12 NOTE — Telephone Encounter (Signed)
 Rhapsido not covered by insurance since patient must try and fail at least one preferred drug. Please advise.

## 2024-04-15 ENCOUNTER — Ambulatory Visit: Admitting: Psychology

## 2024-04-15 DIAGNOSIS — F4321 Adjustment disorder with depressed mood: Secondary | ICD-10-CM

## 2024-04-15 DIAGNOSIS — F4322 Adjustment disorder with anxiety: Secondary | ICD-10-CM | POA: Diagnosis not present

## 2024-04-15 NOTE — Telephone Encounter (Signed)
 Voicemail box full

## 2024-04-15 NOTE — Progress Notes (Unsigned)
 [facility] Counselor/Therapist Progress Note  Patient ID: Tracey Huff, MRN: [id number],    Date: 04/15/2024  Time Spent: 2:33pm-3:32pm     Treatment Type: Individual Therapy   pt is seen for an in person visit  Reported Symptoms:  pt reported some worry, but overall feeling positive and increasing confidence.      Mental Status Exam: Appearance:  Well Groomed     Behavior: Appropriate  Motor: Normal  Speech/Language:  Clear and Coherent  Affect: Appropriate  Mood: anxious  Thought process: normal  Thought content:   WNL  Sensory/Perceptual disturbances:   WNL  Orientation: oriented to person, place, time/date, and situation  Attention: Good  Concentration: Good  Memory: WNL  Fund of knowledge:  Good  Insight:   Good  Judgment:  Good  Impulse Control: Good   Risk Assessment: Danger to Self:  No Self-injurious Behavior: No Danger to Others: No Duty to Warn:no Physical Aggression / Violence:No  Access to Firearms a concern: No  Gang Involvement:No   Subjective: Counselor assessed pt current functioning per pt report.  Processed w/ pt recent positives and stressors.  Explored emotions and how responding to stressors.  Reflected pt reframing and change in mindset.  Discussed positives w/ program planning and supports.    Pt affect wnl.  Pt reports that she still having some worry w/ some gossip being spread at work.  Pt discussed boundaries she is maintaining.   Pt reports she had a barrier re: location used for previous community program and how initially took personal as against her.  Pt was able to reframe and exploring other options for program.  Pt reports she is feeling confident about using her voice and story to support others.  Pt discussed trust issues w/ developing relationships and recent interactions.     Interventions: Cognitive Behavioral Therapy, Assertiveness/Communication, and supportive  Diagnosis:Adjustment disorder with  anxiety  Grief  Plan: Pt f/u in 2-3 weeks for counseling.  Pt to f/u a scheduled w/ PCP.       Individualized Treatment Plan Strengths: writing her own book about her experience,  moving into home bought, her faith, enjoys shopping, joined a dance team  Supports: police friends, daughters, friends, spiritual mentors    Goal/Needs for Treatment:  In order of importance to patient 1) increase appropriate expression of feelings.   2) cope w/ stressors 3) ---    Client Statement of Needs:  I still need to work towards finding me, I can still question myself and what I am doing and want to be able to accept myself.  Continue to creat safe space to talk.     Treatment Level: outpt counseling  Symptoms: feeling anxious/on edge, grief  Client Treatment Preferences:biweekly counseling. Continue med management w/ PCP    Healthcare consumer's goal for treatment:   Counselor, Damien Herald, [facility] will support the patient's ability to achieve the goals identified. Cognitive Behavioral Therapy, Assertive Communication/Conflict Resolution Training, Relaxation Training, ACT, Humanistic and other evidenced-based practices will be used to promote progress towards healthy functioning.    Healthcare consumer will: Actively participate in therapy, working towards healthy functioning.     *Justification for Continuation/Discontinuation of Goal: R=Revised, O=Ongoing, A=Achieved, D=Discontinued   Goal 1) Increase pt verbal expression of feelings, grief and emotions. Baseline date 09/18/23: Progress towards goal 50; How Often - Daily Target Date Goal Was reviewed Status Code Progress towards goal/Likert rating  09/17/24  Goal 2) Increase self care and coping skills daily to manage stress to reduce anxiety and irritability AEB Pt report and therapist observation. Baseline date 09/18/23: Progress towards goal 50; How Often - Daily Target Date Goal Was reviewed Status Code  Progress towards goal  09/17/24                            Goal 3) Increase self discovery, awareness of self, values and goals AEB Pt report and therapist observation. Baseline date 09/18/23: Progress towards goal 25; How Often - Daily Target Date Goal Was reviewed Status Code Progress towards goal  09/17/24                            This plan has been reviewed and created by the following participants:  This plan will be reviewed at least every 12 months. Date Behavioral Health Clinician Date Guardian/Patient   09/18/23          [facility] 09/18/23 Verbal Consent Provided and electronic signature requested       10/07/23  Electronic signature completed by pt                [place], LCMHC

## 2024-04-22 DIAGNOSIS — J3081 Allergic rhinitis due to animal (cat) (dog) hair and dander: Secondary | ICD-10-CM | POA: Diagnosis not present

## 2024-04-22 DIAGNOSIS — J301 Allergic rhinitis due to pollen: Secondary | ICD-10-CM | POA: Diagnosis not present

## 2024-04-22 DIAGNOSIS — J3089 Other allergic rhinitis: Secondary | ICD-10-CM | POA: Diagnosis not present

## 2024-04-26 NOTE — Telephone Encounter (Signed)
Discussed with patient and appointment scheduled.

## 2024-04-27 ENCOUNTER — Encounter: Payer: Self-pay | Admitting: Dermatology

## 2024-04-27 ENCOUNTER — Ambulatory Visit (INDEPENDENT_AMBULATORY_CARE_PROVIDER_SITE_OTHER): Admitting: Dermatology

## 2024-04-27 DIAGNOSIS — L299 Pruritus, unspecified: Secondary | ICD-10-CM

## 2024-04-27 DIAGNOSIS — Z7189 Other specified counseling: Secondary | ICD-10-CM

## 2024-04-27 DIAGNOSIS — L501 Idiopathic urticaria: Secondary | ICD-10-CM

## 2024-04-27 DIAGNOSIS — R21 Rash and other nonspecific skin eruption: Secondary | ICD-10-CM

## 2024-04-27 DIAGNOSIS — L259 Unspecified contact dermatitis, unspecified cause: Secondary | ICD-10-CM

## 2024-04-27 DIAGNOSIS — K130 Diseases of lips: Secondary | ICD-10-CM

## 2024-04-27 DIAGNOSIS — Z79899 Other long term (current) drug therapy: Secondary | ICD-10-CM

## 2024-04-27 DIAGNOSIS — L819 Disorder of pigmentation, unspecified: Secondary | ICD-10-CM

## 2024-04-27 MED ORDER — DUPILUMAB 300 MG/2ML ~~LOC~~ SOAJ
600.0000 mg | Freq: Once | SUBCUTANEOUS | Status: AC
  Administered 2024-04-27: 600 mg via SUBCUTANEOUS

## 2024-04-27 MED ORDER — DUPIXENT 300 MG/2ML ~~LOC~~ SOAJ: 300.0000 mg | SUBCUTANEOUS | 2 refills | Status: DC

## 2024-04-27 MED ORDER — DUPILUMAB 300 MG/2ML ~~LOC~~ SOAJ
300.0000 mg | SUBCUTANEOUS | Status: AC
  Administered 2024-05-11 – 2024-05-25 (×2): 300 mg via SUBCUTANEOUS

## 2024-04-27 NOTE — Patient Instructions (Signed)

## 2024-04-27 NOTE — Progress Notes (Signed)
" ° °  Follow-Up Visit   Subjective  Tracey Huff is a 53 y.o. female who presents for the following: Patient breaks out in hives and rashes when she comes in contact with certain products, which happens about 3 or 4 time a month, she is here to discuss treatment with Dupixent  or Xolair. No active breakouts today. Patients insurance will not cover Rhapisido   The following portions of the chart were reviewed this encounter and updated as appropriate: medications, allergies, medical history  Review of Systems:  No other skin or systemic complaints except as noted in HPI or Assessment and Plan.  Objective  Well appearing patient in no apparent distress; mood and affect are within normal limits.  A focused examination was performed of the following areas:face,arms,legs,  Relevant exam findings are noted in the Assessment and Plan.    Assessment & Plan   Rash -  allergic contact dermatitis likely due to foods/spices/peppers  Cheilitis and Dyschromia  and  Urticaria / Angioedema (Chronic Idiopathic Urticaria)  All the above may or may not be related.  Chronic and persistent condition with duration or expected duration over one year. Condition is bothersome/symptomatic for patient. Currently flared.   But has overall been better. She feels it may be from stress from her husband's brain tumor and subsequent recent death Exam: slight swelling and hyperpigmentation lips. R arm with hyperpigmented edematous patch    Treatment Plan: History of patch testing , prick testing, and blood work   History of patch testing showing allergy to nickel.   History of  allergy testing prior that showed positive allergy  to trees, grass, weed, ragweed, mold, dust mites, cockroach, dog, cat, feathers. Negative to walnut, peanut, pecan, cashew, almond, brazil nut, wheat, milk, egg, trout, oyster, shrimp, flounder, tuna, clam and catfish, crab and soy. H/o of breaking out in hives. Currently on  tamoxifen .   Patient seen by allergist Dr. Luke at [facility] on 11/13/2022 and here were her recommendations  - Did not recommend RAST testing  - Recommended to avoid spicy foods and acidic foods as those tend to irritate sensitive skin. - Recommended to take pictures when rash/swelling - Recommended to start Xyzal  5 mg at night to help with itching  - Recommend Avoiding the following potential triggers: alcohol, tight clothing, NSAIDs, hot showers and getting overheated. See below for proper skin care.  Use fragrance free and dye free products. Don't use mint products.  Try kids flouride free toothpaste.  - Recommended to get blood work to rule out etiologies   My recommendations today are to :   Continue Opzelura  cream to aa's BID to any patch of new rash. Start Dupixent   for Chronic Idiopathic Urticaria. Start Dupixent  600 mg injected right abdomen and left abdomen, patient tolerated well  Lot [id number] Exp 10/2025  CONTACT DERMATITIS, UNSPECIFIED CONTACT DERMATITIS TYPE, UNSPECIFIED TRIGGER   This Visit - Dupilumab  SOAJ 600 mg - Dupilumab  SOAJ 300 mg  Return in about 2 weeks (around 05/11/2024) for Dupixent - rash-hives .  IFay Kirks, CMA, am acting as scribe for Alm Rhyme, MD .   Documentation: I have reviewed the above documentation for accuracy and completeness, and I agree with the above.  Alm Rhyme, MD    "

## 2024-05-03 ENCOUNTER — Ambulatory Visit: Payer: Self-pay

## 2024-05-03 ENCOUNTER — Ambulatory Visit: Admitting: Psychology

## 2024-05-03 DIAGNOSIS — F4381 Prolonged grief disorder: Secondary | ICD-10-CM | POA: Diagnosis not present

## 2024-05-03 DIAGNOSIS — F4322 Adjustment disorder with anxiety: Secondary | ICD-10-CM | POA: Diagnosis not present

## 2024-05-03 DIAGNOSIS — F4321 Adjustment disorder with depressed mood: Secondary | ICD-10-CM

## 2024-05-03 NOTE — Progress Notes (Signed)
 "      [facility] Counselor/Therapist Progress Note  Patient ID: Tracey Huff, MRN: [id number],    Date: 05/03/2024  Time Spent: 2:32pm-3:27pm     Treatment Type: Individual Therapy   pt is seen for a virtual visit via caregility.  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  Pt consents to virtual visit and is aware of limitations of such visits.   Reported Symptoms:  pt reported grief/loss of holidays and increased anxiety w/ cancellation of plans/uncertainty of relationship.   Mental Status Exam: Appearance:  Well Groomed     Behavior: Appropriate  Motor: Normal  Speech/Language:  Clear and Coherent  Affect: Appropriate  Mood: anxious  Thought process: normal  Thought content:   WNL  Sensory/Perceptual disturbances:   WNL  Orientation: oriented to person, place, time/date, and situation  Attention: Good  Concentration: Good  Memory: WNL  Fund of knowledge:  Good  Insight:   Good  Judgment:  Good  Impulse Control: Good   Risk Assessment: Danger to Self:  No Self-injurious Behavior: No Danger to Others: No Duty to Warn:no Physical Aggression / Violence:No  Access to Firearms a concern: No  Gang Involvement:No   Subjective: Counselor assessed pt current functioning per pt report.  Processed w/ pt recent grief, stressors and emotions.  Discussed recent cancellation of trip by friend and lack of communication.  Explored impact on relationship, trust and boundaries.  Reflected positives pt has w/ her interests, serving community and how to put her energy to what she values.    Pt affect wnl.  Pt reports rough weeks.  Pt reports grief w/ anniversary of mother's death, holidays and grieving husband.   Pt reported that friend cancelled their trip last minute and has been distant and not paid her back for her portion.  Pt discussed how impacting relationship.  Pt discussed boundaries as sees pattern in friends' behavior.  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  Interventions: Cognitive Behavioral Therapy, Assertiveness/Communication, and supportive  Diagnosis:Adjustment disorder with anxiety  Grief  Plan: Pt f/u in 2-3 weeks for counseling.  Pt to f/u as scheduled w/ PCP.       Individualized Treatment Plan Strengths: writing her own book about her experience,  moving into home bought, her faith, enjoys shopping, joined a dance team  Supports: police friends, daughters, friends, spiritual mentors    Goal/Needs for Treatment:  In order of importance to patient 1) increase appropriate expression of feelings.   2) cope w/ stressors 3) ---    Client Statement of Needs:  I still need to work towards finding me, I can still question myself and what I am doing and want to be able to accept myself.  Continue to creat safe space to talk.     Treatment Level: outpt counseling  Symptoms: feeling anxious/on edge, grief  Client Treatment Preferences:biweekly counseling. Continue med management w/ PCP    Healthcare consumer's goal for treatment:   Counselor, Damien Herald, [facility] will support the patient's ability to achieve the goals identified. Cognitive Behavioral Therapy, Assertive Communication/Conflict Resolution Training, Relaxation Training, ACT, Humanistic and other evidenced-based practices will be used to promote progress towards healthy functioning.    Healthcare consumer will: Actively participate in therapy, working towards healthy functioning.     *Justification for Continuation/Discontinuation of Goal: R=Revised, O=Ongoing, A=Achieved, D=Discontinued   Goal  1) Increase pt verbal expression of feelings, grief and emotions. Baseline date 09/18/23: Progress towards goal 50; How Often - Daily Target Date Goal Was reviewed Status Code Progress towards goal/Likert rating  09/17/24                             Goal 2) Increase self care and coping skills daily to manage stress to reduce anxiety and irritability AEB Pt report and therapist observation. Baseline date 09/18/23: Progress towards goal 50; How Often - Daily Target Date Goal Was reviewed Status Code Progress towards goal  09/17/24                            Goal 3) Increase self discovery, awareness of self, values and goals AEB Pt report and therapist observation. Baseline date 09/18/23: Progress towards goal 25; How Often - Daily Target Date Goal Was reviewed Status Code Progress towards goal  09/17/24                            This plan has been reviewed and created by the following participants:  This plan will be reviewed at least every 12 months. Date Behavioral Health Clinician Date Guardian/Patient   09/18/23          [facility] 09/18/23 Verbal Consent Provided and electronic signature requested       10/07/23  Electronic signature completed by pt                [place], [facility] "

## 2024-05-03 NOTE — Telephone Encounter (Signed)
 FYI Only or Action Required?: FYI only for provider: appointment scheduled on 05/05/24.  Patient was last seen in primary care on 01/29/2024 by Gareth Mliss FALCON, FNP.  Called Nurse Triage reporting Knee Pain.  Symptoms began several days ago.  Interventions attempted: OTC medications: ibuprofen .  Symptoms are: gradually worsening.  Triage Disposition: See PCP When Office is Open (Within 3 Days)  Patient/caregiver understands and will follow disposition?: Yes    Hx intermittent right knee pain since pt was 53 years old after falling and hitting knee. Normally has little to no pain and when it occurs it passes quickly. Onset constant 6/10 pain in right knee 3 days ago that has not let up. Woke pain up last night. No redness or swelling. No fever. Looks like there is a small knot. Able to walk around fine. No CB, SOB or calf pain. Scheduled appt with provider at different office in pt region d/t no availability at home office with any provider within timeframe. Advised UC or ED for worsening symptoms.     Copied from CRM [id number]. Topic: Clinical - Red Word Triage >> May 03, 2024  3:32 PM Antony RAMAN wrote: Red Word that prompted transfer to Nurse Triage: knee pain when walking Reason for Disposition  [1] MODERATE pain (e.g., interferes with normal activities, limping) AND [2] present > 3 days  Answer Assessment - Initial Assessment Questions 1. LOCATION and RADIATION: Where is the pain located?      Right knee  2. QUALITY: What does the pain feel like?  (e.g., sharp, dull, aching, burning)     Aching and dull  3. SEVERITY: How bad is the pain? What does it keep you from doing?   (Scale 1-10; or mild, moderate, severe)     6/10  4. ONSET: When did the pain start? Does it come and go, or is it there all the time?     3 days ago, hx of right knee pain since she was 53 years old  5. RECURRENT: Have you had this pain before? If Yes, ask: When, and what happened  then?     Hx of right knee pain since she was 53 years old  6. SETTING: Has there been any recent work, exercise or other activity that involved that part of the body?      Maybe while line dancing or going to the gym  7. AGGRAVATING FACTORS: What makes the knee pain worse? (e.g., walking, climbing stairs, running)     Walking  8. ASSOCIATED SYMPTOMS: Is there any swelling or redness of the knee?     Denies  9. OTHER SYMPTOMS: Do you have any other symptoms? (e.g., calf pain, chest pain, difficulty breathing, fever)     Looks like there is a small knot  Protocols used: Knee Pain-A-AH

## 2024-05-04 ENCOUNTER — Other Ambulatory Visit: Payer: Self-pay | Admitting: Nurse Practitioner

## 2024-05-04 DIAGNOSIS — I10 Essential (primary) hypertension: Secondary | ICD-10-CM

## 2024-05-04 DIAGNOSIS — Z79899 Other long term (current) drug therapy: Secondary | ICD-10-CM

## 2024-05-04 DIAGNOSIS — Z794 Long term (current) use of insulin: Secondary | ICD-10-CM

## 2024-05-04 DIAGNOSIS — E6609 Other obesity due to excess calories: Secondary | ICD-10-CM

## 2024-05-05 ENCOUNTER — Other Ambulatory Visit: Payer: Self-pay | Admitting: Family

## 2024-05-05 ENCOUNTER — Ambulatory Visit
Admission: RE | Admit: 2024-05-05 | Discharge: 2024-05-05 | Disposition: A | Discharge status: Home / Self Care | Source: Ambulatory Visit | Attending: Family | Admitting: Family

## 2024-05-05 ENCOUNTER — Other Ambulatory Visit (HOSPITAL_COMMUNITY): Payer: Self-pay

## 2024-05-05 ENCOUNTER — Encounter: Payer: Self-pay | Admitting: Family

## 2024-05-05 ENCOUNTER — Ambulatory Visit: Admitting: Family

## 2024-05-05 VITALS — BP 158/90 | HR 92 | Temp 98.2°F | Ht 60.0 in | Wt 161.2 lb

## 2024-05-05 DIAGNOSIS — M25561 Pain in right knee: Secondary | ICD-10-CM | POA: Diagnosis not present

## 2024-05-05 MED ORDER — PREDNISONE 20 MG PO TABS
40.0000 mg | ORAL_TABLET | Freq: Every day | ORAL | 0 refills | Status: DC
  Filled 2024-05-05: qty 10, 5d supply, fill #0

## 2024-05-05 MED ORDER — MELOXICAM 7.5 MG PO TABS
7.5000 mg | ORAL_TABLET | Freq: Every day | ORAL | 1 refills | Status: AC
  Filled 2024-05-05: qty 30, 30d supply, fill #0

## 2024-05-05 NOTE — Progress Notes (Signed)
 "  Acute Office Visit  Subjective:     Patient ID: Tracey Huff, female    DOB: [birth year], 53 y.o.   MRN: [id number]  Chief Complaint  Patient presents with   Knee Pain    Patient states the last couple of days, the pain has gotten worse. Patient states it is usually comes and goes the pain. Patient states she usually take Ibuprofen  to help ease discomfort.     HPI Patient is in today with complaints of right knee pain off-and-on over the past several months.  Patient reports the pain is 6 out of 10 typically.  Takes ibuprofen  that does help ease the discomfort.  She has began exercising at the gym and doing line dancing that she feels may have aggravated her symptoms.  Denies any swelling.  Review of Systems  Musculoskeletal:  Positive for joint pain. Negative for back pain.       Right knee pain  Neurological: Negative.   All other systems reviewed and are negative.   Past Medical History:  Diagnosis Date   Allergy    Anxiety    Asthma    Breast cancer (HCC)    INVASIVE MAMMARY CARCINOMA- left   Family history of prostate cancer    Fibroid uterus 11/15/2014   Hyperlipidemia    Hypertension    Menorrhagia with irregular cycle 11/15/2014   Pre-diabetes    Urticaria     Social History   Socioeconomic History   Marital status: Married    Spouse name: Ubaldo   Number of children: 2   Years of education: Not on file   Highest education level: Not on file  Occupational History   Not on file  Tobacco Use   Smoking status: Never    Passive exposure: Never   Smokeless tobacco: Never  Vaping Use   Vaping status: Never Used  Substance and Sexual Activity   Alcohol use: Not Currently    Comment: occassional   Drug use: No   Sexual activity: Yes    Partners: Male    Birth control/protection: Surgical  Other Topics Concern   Not on file  Social History Narrative   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 Lives with husband.    Social Drivers of Health   Tobacco Use: Low Risk (05/05/2024)   Patient History    Smoking Tobacco Use: Never    Smokeless Tobacco Use: Never    Passive Exposure: Never  Financial Resource Strain: Low Risk (08/09/2022)   Overall Financial Resource Strain (CARDIA)    Difficulty of Paying Living Expenses: Not hard at all  Food Insecurity: No Food Insecurity (08/09/2022)   Hunger Vital Sign    Worried About Running Out of Food in the Last Year: Never true    Ran Out of Food in the Last Year: Never true  Transportation Needs: No Transportation Needs (08/09/2022)   PRAPARE - Administrator, Civil Service (Medical): No    Lack of Transportation (Non-Medical): No  Physical Activity: Sufficiently Active (08/04/2023)   Received from [facility]   Exercise Vital Sign    On average, how many days per week do you engage in moderate to strenuous exercise (like a brisk walk)?: 6 days    On average, how many minutes do you engage in exercise at this level?: 30 min  Stress: No Stress Concern Present (08/09/2022)   [employer] of Occupational Health -  Occupational Stress Questionnaire    Feeling of Stress : Only a little  Social Connections: Socially Integrated (08/09/2022)   Social Connection and Isolation Panel    Frequency of Communication with Friends and Family: More than three times a week    Frequency of Social Gatherings with Friends and Family: More than three times a week    Attends Religious Services: More than 4 times per year    Active Member of Clubs or Organizations: No    Attends Engineer, Structural: More than 4 times per year    Marital Status: Married  Catering Manager Violence: Not At Risk (08/09/2022)   Humiliation, Afraid, Rape, and Kick questionnaire    Fear of Current or Ex-Partner: No    Emotionally Abused: No    Physically Abused: No    Sexually Abused: No  Depression (PHQ2-9): Low  Risk (01/29/2024)   Depression (PHQ2-9)    PHQ-2 Score: 0  Alcohol Screen: Low Risk (02/10/2023)   Alcohol Screen    Last Alcohol Screening Score (AUDIT): 0  Housing: Low Risk (08/09/2022)   Housing    Last Housing Risk Score: 0  Utilities: Not At Risk (08/09/2022)   AHC Utilities    Threatened with loss of utilities: No  Health Literacy: Not on file    Past Surgical History:  Procedure Laterality Date   ABDOMINAL HYSTERECTOMY N/A 11/15/2014   Procedure: HYSTERECTOMY ABDOMINAL/BILATERAL SALPINGECTOMY ;  Surgeon: Garnette JONETTA Mace, MD;  Location: [facility];  Service: Gynecology;  Laterality: N/A;   BREAST BIOPSY Right    2019 negative   BREAST BIOPSY Left 03/09/2021   u/s biopsy, 12-1 o'clock, VENUS clip-path pending   BREAST LUMPECTOMY,RADIO FREQ LOCALIZER,AXILLARY SENTINEL LYMPH NODE BIOPSY Left 03/28/2021   Procedure: BREAST LUMPECTOMY,RADIO FREQ LOCALIZER,AXILLARY SENTINEL LYMPH NODE BIOPSY;  Surgeon: Jordis Laneta FALCON, MD;  Location: [facility];  Service: General;  Laterality: Left;   BREAST RECONSTRUCTION WITH PLACEMENT OF TISSUE EXPANDER AND FLEX HD (ACELLULAR HYDRATED DERMIS) Bilateral 05/15/2021   Procedure: BILATERAL BREAST RECONSTRUCTION WITH PLACEMENT OF TISSUE EXPANDER AND FLEX HD (ACELLULAR HYDRATED DERMIS);  Surgeon: Elisabeth Craig RAMAN, MD;  Location: [facility];  Service: Plastics;  Laterality: Bilateral;   CESAREAN SECTION  1990   COLONOSCOPY WITH PROPOFOL  N/A 01/04/2022   Procedure: COLONOSCOPY WITH PROPOFOL ;  Surgeon: Therisa Bi, MD;  Location: [facility];  Service: Gastroenterology;  Laterality: N/A;   COLPOSCOPY  07/28/2018   CYSTOSCOPY N/A 11/15/2014   Procedure: CYSTOSCOPY;  Surgeon: Garnette JONETTA Mace, MD;  Location: [facility];  Service: Gynecology;  Laterality: N/A;   DILATION AND CURETTAGE OF UTERUS     GANGLION CYST EXCISION Left    REMOVAL OF BILATERAL TISSUE EXPANDERS WITH PLACEMENT OF BILATERAL BREAST IMPLANTS Bilateral 10/02/2021   Procedure: REMOVAL  OF BILATERAL TISSUE EXPANDERS WITH PLACEMENT OF BILATERAL BREAST IMPLANTS;  Surgeon: Elisabeth Craig RAMAN, MD;  Location: [facility];  Service: Plastics;  Laterality: Bilateral;   TOTAL MASTECTOMY Bilateral 05/15/2021   Procedure: TOTAL MASTECTOMY, Bilateral Simple Mastectomy, skin sparing;  Surgeon: Jordis Laneta FALCON, MD;  Location: [facility];  Service: General;  Laterality: Bilateral;  Provider requesting for 2 hours / 120 min for procedure.   UTERINE FIBROID SURGERY      Family History  Problem Relation Age of Onset   Allergic rhinitis Mother    Congestive Heart Failure Mother    Diabetes Mother    Hypertension Mother    Diabetes Maternal Grandmother    Congestive Heart Failure Maternal Grandfather  Breast cancer Neg Hx     Allergies[1]  Medications Ordered Prior to Encounter[2]  BP (!) 158/90 (BP Location: Right Arm, Patient Position: Sitting, Cuff Size: Normal)   Pulse 92   Temp 98.2 F (36.8 C) (Oral)   Ht 5' (1.524 m)   Wt 161 lb 3.2 oz (73.1 kg)   LMP 11/15/2014   SpO2 98%   BMI 31.48 kg/m chart     Objective:    BP (!) 158/90 (BP Location: Right Arm, Patient Position: Sitting, Cuff Size: Normal)   Pulse 92   Temp 98.2 F (36.8 C) (Oral)   Ht 5' (1.524 m)   Wt 161 lb 3.2 oz (73.1 kg)   LMP 11/15/2014   SpO2 98%   BMI 31.48 kg/m    Physical Exam Vitals and nursing note reviewed.  Constitutional:      Appearance: Normal appearance. She is normal weight.  Cardiovascular:     Rate and Rhythm: Normal rate and regular rhythm.     Pulses: Normal pulses.     Heart sounds: Normal heart sounds.  Pulmonary:     Effort: Pulmonary effort is normal.     Breath sounds: Normal breath sounds.  Musculoskeletal:        General: No swelling.     Cervical back: Normal range of motion and neck supple.     Comments: Right knee: No obvious swelling.  Mild pain with range of motion.  No joint effusion.  Positive crepitus  Skin:    General: Skin is warm and  dry.  Neurological:     General: No focal deficit present.     Mental Status: She is alert and oriented to person, place, and time. Mental status is at baseline.  Psychiatric:        Mood and Affect: Mood normal.        Behavior: Behavior normal.        Thought Content: Thought content normal.        Judgment: Judgment normal.    No results found for any visits on 05/05/24.      Assessment & Plan:   Problem List Items Addressed This Visit   None Visit Diagnoses       Acute pain of right knee    -  Primary   Relevant Orders   DG Knee Complete 4 Views Right       Meds ordered this encounter  Medications   predniSONE  (DELTASONE ) 20 MG tablet    Sig: Take 2 tablets (40 mg total) by mouth daily with breakfast.    Dispense:  10 tablet    Refill:  0   meloxicam  (MOBIC ) 7.5 MG tablet    Sig: Take 1 tablet (7.5 mg total) by mouth daily.    Dispense:  30 tablet    Refill:  1    After completing prednisone    Call the office if symptoms worsen or persist.  Recheck as scheduled and sooner as needed. No follow-ups on file.  Marsa Matteo B Ivon Oelkers, FNP       [1] Allergies Allergen Reactions   Macadamia Nut Oil Shortness Of Breath   Apple Juice Nausea And Vomiting   Brazil Nut (Berthollefia Excelsa) Nausea And Vomiting and Swelling    Tongue swelling   Carrot Oil Nausea And Vomiting    Can not cooked raw carrots   Fruit & Vegetable Daily [Nutritional Supplements] Nausea And Vomiting    Cannot tolerate apples, peaches, plums, and nectarines   Kiwi Extract Nausea  And Vomiting   Other     Lobster   Peach [Prunus Persica] Nausea And Vomiting  [2] Current Outpatient Medications on File Prior to Visit  Medication Sig Dispense Refill   albuterol  (PROVENTIL  HFA) 108 (90 Base) MCG/ACT inhaler Inhale 1-2 puffs into the lungs every 4 (four) - 6 (six) hours as needed for cough/wheeze 6.7 g 0   ALPRAZolam  (XANAX ) 0.25 MG tablet Take 1 tablet (0.25 mg total) by mouth 2 (two)  times daily as needed for anxiety (severe anxiety). 20 tablet 0   Ascorbic Acid (VITAMIN C) 100 MG tablet Take 100 mg by mouth daily.     atorvastatin  (LIPITOR) 10 MG tablet Take 1 tablet (10 mg total) by mouth daily at bedtime. 90 tablet 1   azelastine  (ASTELIN ) 0.1 % nasal spray Place 1-2 sprays into both nostrils 2 (two) times daily. 30 mL 6   busPIRone  (BUSPAR ) 10 MG tablet Take 1 tablet (10 mg total) by mouth 2 (two) times daily as needed (anxiety). 180 tablet 2   diphenhydrAMINE  (BENADRYL ) 2 % cream Apply 1 application topically 2 (two) times daily as needed for itching.     Docosanol  10 % CREA Apply 1 Application topically every 4 (four) hours. 2 g 0   Dupilumab  (DUPIXENT ) 300 MG/2ML SOAJ Inject 300 mg into the skin every 14 (fourteen) days. Starting at day 15 for maintenance. 4 mL 2   ELDERBERRY PO Take 1 capsule by mouth daily.     EPINEPHrine  0.3 mg/0.3 mL IJ SOAJ injection Inject 0.3 mg into the muscle as needed, as directed. 2 each 1   ezetimibe  (ZETIA ) 10 MG tablet Take 1 tablet (10 mg total) by mouth daily. 90 tablet 3   fluticasone  (FLONASE ) 50 MCG/ACT nasal spray Place 1-2 sprays into both nostrils once daily. 16 g 6   Fluticasone -Umeclidin-Vilant (TRELEGY ELLIPTA ) 200-62.5-25 MCG/ACT AEPB Inhale 1 puff into the lungs daily. 60 each 6   hydrocortisone cream 1 % Apply 1 application topically daily as needed for itching.     hydrOXYzine  (VISTARIL ) 25 MG capsule TAKE 1 CAPSULE (25 MG TOTAL) BY MOUTH AT BEDTIME AS NEEDED FOR UP TO 30 DOSES (DIFFICULTY SLEEPING). 30 capsule 1   ipratropium-albuterol  (DUONEB) 0.5-2.5 (3) MG/3ML SOLN Take 3 mLs by nebulization every 6 (six) hours as needed (wheeze, shortness of breath, cough variant asthma). 360 mL 1   levocetirizine (XYZAL ) 5 MG tablet Take 1 tablet (5 mg total) by mouth every evening. 30 tablet 6   losartan  (COZAAR ) 25 MG tablet Take 1 tablet (25 mg total) by mouth every morning. 90 tablet 1   montelukast  (SINGULAIR ) 10  MG tablet Take 1 tablet (10 mg total) by mouth every evening. 30 tablet 6   mupirocin  ointment (BACTROBAN ) 2 % Apply 1 Application topically 2 (two) times daily. 22 g 0   Olopatadine  HCl (PATADAY ) 0.2 % SOLN Instill 1 drop into affected eye once daily 2.5 mL 6   Remibrutinib  (RHAPSIDO ) 25 MG TABS Take 1 tablet by mouth in the morning and at bedtime. For rash 60 tablet 11   Ruxolitinib  Phosphate (OPZELURA ) 1.5 % CREA Apply 1 application topically as directed 1- 2  times daily. 60 g 11   Semaglutide ,0.25 or 0.5MG /DOS, (OZEMPIC , 0.25 OR 0.5 MG/DOSE,) 2 MG/3ML SOPN Inject 0.5 mg into the skin once a week. 9 mL 1   tamoxifen  (NOLVADEX ) 20 MG tablet Take 1 tablet (20 mg total) by mouth daily. 90 tablet 1   triamcinolone  cream (KENALOG ) 0.1 % Apply  1 Application topically 2 (two) times daily. 30 g 0   venlafaxine  XR (EFFEXOR  XR) 150 MG 24 hr capsule Take 1 capsule (150 mg total) by mouth daily with breakfast. 90 capsule 1   zolpidem  (AMBIEN ) 5 MG tablet Take 1 tablet (5 mg total) by mouth at bedtime as needed for sleep. 90 tablet 1   Current Facility-Administered Medications on File Prior to Visit  Medication Dose Route Frequency Provider Last Rate Last Admin   [START ON 05/11/2024] Dupilumab  SOAJ 300 mg  300 mg Subcutaneous Q14 Days Hester Alm BROCKS, MD      "

## 2024-05-06 MED ORDER — OZEMPIC (0.25 OR 0.5 MG/DOSE) 2 MG/3ML ~~LOC~~ SOPN
0.5000 mg | PEN_INJECTOR | SUBCUTANEOUS | 1 refills | Status: AC
  Filled 2024-05-06 – 2024-05-07 (×3): qty 9, 84d supply, fill #0

## 2024-05-06 MED ORDER — LOSARTAN POTASSIUM 25 MG PO TABS
25.0000 mg | ORAL_TABLET | ORAL | 1 refills | Status: AC
  Filled 2024-05-06 – 2024-05-07 (×2): qty 90, 90d supply, fill #0

## 2024-05-06 NOTE — Telephone Encounter (Signed)
 Requested Prescriptions  Pending Prescriptions Disp Refills   losartan  (COZAAR ) 25 MG tablet 90 tablet 1    Sig: Take 1 tablet (25 mg total) by mouth every morning.     Cardiovascular:  Angiotensin Receptor Blockers Failed - 05/06/2024  8:21 AM      Failed - Cr in normal range and within 180 days    Creat  Date Value Ref Range Status  08/11/2023 0.84 0.50 - 1.03 mg/dL Final   Creatinine, Urine  Date Value Ref Range Status  08/11/2023 196 20 - 275 mg/dL Final         Failed - K in normal range and within 180 days    Potassium  Date Value Ref Range Status  08/11/2023 4.3 3.5 - 5.3 mmol/L Final  08/24/2014 3.3 (L) mmol/L Final    Comment:    3.5-5.1 NOTE: New Reference Range  07/12/14          Failed - Last BP in normal range    BP Readings from Last 1 Encounters:  05/05/24 (!) 158/90         Failed - Valid encounter within last 6 months    Recent Outpatient Visits           3 months ago Insect bite of right ankle, initial encounter   [facility] Gareth Mliss FALCON, FNP   7 months ago Annual physical exam   [facility] Gareth Mliss FALCON, FNP   8 months ago Immunization due   [facility] Gareth Mliss FALCON, FNP       Future Appointments             In 2 months Hester Alm BROCKS, MD [facility]   In 5 months Gareth, Mliss FALCON, FNP [facility] [place]   In 10 months Hester Alm BROCKS, MD [facility]            Passed - Patient is not pregnant       Semaglutide ,0.25 or 0.5MG /DOS, (OZEMPIC , 0.25 OR 0.5 MG/DOSE,) 2 MG/3ML SOPN 9 mL 1    Sig: Inject 0.5 mg into the skin once a week.     Endocrinology:  Diabetes - GLP-1 Receptor Agonists - semaglutide  Failed - 05/06/2024  8:21 AM      Failed - HBA1C in normal range and within 180 days    Hgb A1c MFr Bld  Date Value Ref Range Status  08/11/2023 6.2 (H) <5.7 % of total Hgb  Final    Comment:    For someone without known diabetes, a hemoglobin  A1c value between 5.7% and 6.4% is consistent with prediabetes and should be confirmed with a  follow-up test. . For someone with known diabetes, a value <7% indicates that their diabetes is well controlled. A1c targets should be individualized based on duration of diabetes, age, comorbid conditions, and other considerations. . This assay result is consistent with an increased risk of diabetes. . Currently, no consensus exists regarding use of hemoglobin A1c for diagnosis of diabetes for children. .          Failed - Valid encounter within last 6 months    Recent Outpatient Visits           3 months ago Insect bite of right ankle, initial encounter   [facility] Gareth Mliss FALCON, FNP   7 months ago Annual physical exam  [facility] Gareth Mliss FALCON, FNP   8 months ago Immunization due   [facility] Gareth Mliss FALCON, FNP       Future Appointments             In 2 months Hester Alm BROCKS, MD [facility]   In 5 months Gareth, Mliss FALCON, FNP [facility] [place]   In 10 months Hester Alm BROCKS, MD [facility]            Passed - Cr in normal range and within 360 days    Creat  Date Value Ref Range Status  08/11/2023 0.84 0.50 - 1.03 mg/dL Final   Creatinine, Urine  Date Value Ref Range Status  08/11/2023 196 20 - 275 mg/dL Final

## 2024-05-07 ENCOUNTER — Other Ambulatory Visit: Payer: Self-pay

## 2024-05-07 ENCOUNTER — Other Ambulatory Visit (HOSPITAL_COMMUNITY): Payer: Self-pay

## 2024-05-07 ENCOUNTER — Encounter: Payer: Self-pay | Admitting: Pharmacist

## 2024-05-11 ENCOUNTER — Ambulatory Visit

## 2024-05-11 DIAGNOSIS — L501 Idiopathic urticaria: Secondary | ICD-10-CM

## 2024-05-11 NOTE — Progress Notes (Signed)
 Patient here today for Dupixent  injection for Chronic Idiopathic Urticaria.  Dupixent  Pen 300mg  injected into left upper thigh . Patient tolerated injection well.  LOT: [id number] EXP: 12/03/2025  Alan Pizza, RMA

## 2024-05-12 ENCOUNTER — Telehealth: Payer: Self-pay

## 2024-05-12 NOTE — Telephone Encounter (Signed)
 Sent mychart message

## 2024-05-12 NOTE — Telephone Encounter (Signed)
 Copied from CRM [id number]. Topic: Clinical - Lab/Test Results >> May 12, 2024  9:34 AM Wess RAMAN wrote: Reason for CRM: Patient would like to know her xray results from 05/05/24  Callback #: [id number]

## 2024-05-16 ENCOUNTER — Ambulatory Visit: Payer: Self-pay | Admitting: Family

## 2024-05-19 ENCOUNTER — Other Ambulatory Visit: Payer: Self-pay

## 2024-05-19 ENCOUNTER — Ambulatory Visit
Admission: RE | Admit: 2024-05-19 | Discharge: 2024-05-19 | Disposition: A | Discharge status: Home / Self Care | Source: Ambulatory Visit | Attending: Nurse Practitioner | Admitting: Nurse Practitioner

## 2024-05-19 ENCOUNTER — Encounter: Payer: Self-pay | Admitting: Nurse Practitioner

## 2024-05-19 ENCOUNTER — Ambulatory Visit
Admission: RE | Admit: 2024-05-19 | Discharge: 2024-05-19 | Disposition: A | Discharge status: Home / Self Care | Attending: Nurse Practitioner | Admitting: Nurse Practitioner

## 2024-05-19 ENCOUNTER — Other Ambulatory Visit (HOSPITAL_COMMUNITY): Payer: Self-pay

## 2024-05-19 ENCOUNTER — Ambulatory Visit: Admitting: Nurse Practitioner

## 2024-05-19 ENCOUNTER — Ambulatory Visit: Payer: Self-pay | Admitting: Nurse Practitioner

## 2024-05-19 VITALS — BP 142/94 | HR 81 | Temp 98.0°F | Ht 60.0 in | Wt 161.0 lb

## 2024-05-19 DIAGNOSIS — R051 Acute cough: Secondary | ICD-10-CM

## 2024-05-19 DIAGNOSIS — E785 Hyperlipidemia, unspecified: Secondary | ICD-10-CM | POA: Diagnosis not present

## 2024-05-19 DIAGNOSIS — E1165 Type 2 diabetes mellitus with hyperglycemia: Secondary | ICD-10-CM | POA: Diagnosis not present

## 2024-05-19 DIAGNOSIS — F430 Acute stress reaction: Secondary | ICD-10-CM

## 2024-05-19 DIAGNOSIS — Z170 Estrogen receptor positive status [ER+]: Secondary | ICD-10-CM

## 2024-05-19 DIAGNOSIS — F331 Major depressive disorder, recurrent, moderate: Secondary | ICD-10-CM | POA: Diagnosis not present

## 2024-05-19 DIAGNOSIS — E6609 Other obesity due to excess calories: Secondary | ICD-10-CM

## 2024-05-19 DIAGNOSIS — F411 Generalized anxiety disorder: Secondary | ICD-10-CM | POA: Diagnosis not present

## 2024-05-19 DIAGNOSIS — E66811 Obesity, class 1: Secondary | ICD-10-CM

## 2024-05-19 DIAGNOSIS — C50412 Malignant neoplasm of upper-outer quadrant of left female breast: Secondary | ICD-10-CM | POA: Diagnosis not present

## 2024-05-19 DIAGNOSIS — Z6833 Body mass index (BMI) 33.0-33.9, adult: Secondary | ICD-10-CM

## 2024-05-19 DIAGNOSIS — Z794 Long term (current) use of insulin: Secondary | ICD-10-CM

## 2024-05-19 DIAGNOSIS — J4531 Mild persistent asthma with (acute) exacerbation: Secondary | ICD-10-CM

## 2024-05-19 DIAGNOSIS — I10 Essential (primary) hypertension: Secondary | ICD-10-CM

## 2024-05-19 LAB — POCT GLYCOSYLATED HEMOGLOBIN (HGB A1C): Hemoglobin A1C: 5.9 % — AB (ref 4.0–5.6)

## 2024-05-19 MED ORDER — VENLAFAXINE HCL ER 150 MG PO CP24
150.0000 mg | ORAL_CAPSULE | Freq: Every day | ORAL | 1 refills | Status: AC
  Filled 2024-05-19: qty 90, 90d supply, fill #0

## 2024-05-19 MED ORDER — PREDNISONE 20 MG PO TABS
20.0000 mg | ORAL_TABLET | Freq: Every day | ORAL | 0 refills | Status: AC
  Filled 2024-05-19: qty 5, 5d supply, fill #0

## 2024-05-19 MED ORDER — ALBUTEROL SULFATE HFA 108 (90 BASE) MCG/ACT IN AERS
1.0000 | INHALATION_SPRAY | RESPIRATORY_TRACT | 0 refills | Status: AC | PRN
  Filled 2024-05-19: qty 6.7, 16d supply, fill #0

## 2024-05-19 MED ORDER — BENZONATATE 100 MG PO CAPS
200.0000 mg | ORAL_CAPSULE | Freq: Two times a day (BID) | ORAL | 0 refills | Status: AC | PRN
  Filled 2024-05-19: qty 20, 5d supply, fill #0

## 2024-05-19 MED ORDER — PROMETHAZINE-DM 6.25-15 MG/5ML PO SYRP
5.0000 mL | ORAL_SOLUTION | Freq: Four times a day (QID) | ORAL | 0 refills | Status: AC | PRN
  Filled 2024-05-19: qty 118, 6d supply, fill #0

## 2024-05-19 MED ORDER — AZITHROMYCIN 250 MG PO TABS
ORAL_TABLET | ORAL | 0 refills | Status: AC
  Filled 2024-05-19: qty 6, 5d supply, fill #0

## 2024-05-19 NOTE — Progress Notes (Signed)
 "  BP (!) 142/94   Pulse 81   Temp 98 F (36.7 C)   Ht 5' (1.524 m)   Wt 161 lb (73 kg)   LMP 11/15/2014   SpO2 97%   BMI 31.44 kg/m    Subjective:    Patient ID: Tracey Huff, female    DOB: Dec 18, 1970, 54 y.o.   MRN: [id number]  HPI: [name] is a 54 y.o. female  Chief Complaint  Patient presents with   Asthma   Discussed the use of AI scribe software for clinical note transcription with the patient, who gave verbal consent to proceed.  History of Present Illness Tracey Huff is a 54 year old female with asthma who presents with respiratory symptoms.  Respiratory symptoms and asthma management - Persistent respiratory symptoms for six to seven weeks, with worsening over the past three weeks - Constant throat clearing and general malaise - Recent course of prednisone  provided partial relief - Uses nebulizer at work to avoid sleep disturbances at night - Asthma regimen includes albuterol  inhaler as needed, Trelegy daily, Duoneb as needed, Singulair  10 mg daily, and Dupixent  300 mg every 14 days - Wears a mask in public places to avoid exposure to illness, especially after steroid use  Hypertension - Hypertension managed with losartan  25 mg daily - Monitors blood pressure at home BP Readings from Last 3 Encounters:  05/19/24 (!) 142/94  05/05/24 (!) 158/90  03/24/24 (!) 151/86   Blood pressure elevated today, likely due to not feeling well  Type 2 diabetes mellitus - Type 2 diabetes managed with Ozempic  0.5 mg weekly - Last hemoglobin A1c on August 11, 2023 was 6.2, today 5.9  History of breast cancer - History of breast cancer, currently on tamoxifen  20 mg daily - Followed by oncology  Hyperlipidemia - Hyperlipidemia managed with atorvastatin  10 mg daily and Zetia  10 mg daily - Last lipid panel showed LDL 159 and total cholesterol 242  Anxiety and insomnia - Anxiety managed with buspirone  10 mg twice daily as needed - Hydroxyzine  25 mg at bedtime  for insomnia - Expresses concern about medication availability and impact on mental health if doses are missed  Knee arthritis - Arthritis in the knee managed with meloxicam  7.5 mg daily - Recent course of prednisone  provided some relief         05/05/2024    8:25 AM 01/29/2024    3:01 PM 11/26/2023   10:39 AM  Depression screen PHQ 2/9  Decreased Interest 0 0 0  Down, Depressed, Hopeless 0 0 0  PHQ - 2 Score 0 0 0  Altered sleeping 1 0   Tired, decreased energy 0 0   Change in appetite 1 0   Feeling bad or failure about yourself  1 0   Trouble concentrating 1 0   Moving slowly or fidgety/restless 0 0   Suicidal thoughts 0 0   PHQ-9 Score 4 0    Difficult doing work/chores Not difficult at all Not difficult at all      Data saved with a previous flowsheet row definition    Relevant past medical, surgical, family and social history reviewed and updated as indicated. Interim medical history since our last visit reviewed. Allergies and medications reviewed and updated.  Review of Systems  Constitutional: Negative for fever or weight change.  Respiratory: Negative for cough and shortness of breath.   Cardiovascular: Negative for chest pain or palpitations.  Gastrointestinal: Negative for abdominal pain, no bowel changes.  Musculoskeletal: Negative for gait problem or joint swelling.  Skin: Negative for rash.  Neurological: Negative for dizziness or headache.  No other specific complaints in a complete review of systems (except as listed in HPI above).      Objective:      BP (!) 142/94   Pulse 81   Temp 98 F (36.7 C)   Ht 5' (1.524 m)   Wt 161 lb (73 kg)   LMP 11/15/2014   SpO2 97%   BMI 31.44 kg/m    Wt Readings from Last 3 Encounters:  05/19/24 161 lb (73 kg)  05/05/24 161 lb 3.2 oz (73.1 kg)  03/24/24 159 lb 6.4 oz (72.3 kg)    Physical Exam MEASUREMENTS: Weight- 161. GENERAL: Alert, cooperative, well developed, no acute distress HEENT: Normocephalic,  normal oropharynx, moist mucous membranes CHEST: Clear to auscultation bilaterally, No wheezes, rhonchi, or crackles CARDIOVASCULAR: Normal heart rate and rhythm, S1 and S2 normal without murmurs ABDOMEN: Soft, non-tender, non-distended, without organomegaly, Normal bowel sounds EXTREMITIES: No cyanosis or edema NEUROLOGICAL: Cranial nerves grossly intact, Moves all extremities without gross motor or sensory deficit  Results for orders placed or performed in visit on 05/19/24  POCT HgB A1C   Collection Time: 05/19/24 11:16 AM  Result Value Ref Range   Hemoglobin A1C 5.9 (A) 4.0 - 5.6 %   HbA1c POC (<> result, manual entry)     HbA1c, POC (prediabetic range)     HbA1c, POC (controlled diabetic range)            Assessment & Plan:   Problem List Items Addressed This Visit       Cardiovascular and Mediastinum   Essential hypertension     Respiratory   Mild persistent asthma with exacerbation - Primary   Relevant Medications   azithromycin  (ZITHROMAX ) 250 MG tablet   predniSONE  (DELTASONE ) 20 MG tablet   Other Relevant Orders   DG Chest 2 View     Endocrine   Type 2 diabetes mellitus with hyperglycemia, with long-term current use of insulin (HCC)   Relevant Orders   POCT HgB A1C (Completed)     Other   Class 1 obesity due to excess calories with serious comorbidity and body mass index (BMI) of 33.0 to 33.9 in adult   Hyperlipidemia   Anxiety in acute stress reaction   Relevant Medications   venlafaxine  XR (EFFEXOR  XR) 150 MG 24 hr capsule   Carcinoma of upper-outer quadrant of left breast in female, estrogen receptor positive (HCC)   Relevant Medications   azithromycin  (ZITHROMAX ) 250 MG tablet   predniSONE  (DELTASONE ) 20 MG tablet   Moderate episode of recurrent major depressive disorder (HCC)   Relevant Medications   venlafaxine  XR (EFFEXOR  XR) 150 MG 24 hr capsule     Assessment and Plan Assessment & Plan Mild persistent asthma with acute exacerbation Acute  exacerbation of asthma for the past three weeks with symptoms of throat clearing and feeling unwell. No fever reported. Previous improvement with prednisone . Differential includes possible pneumonia. - Ordered chest x-ray to evaluate for pneumonia - Prescribed azithromycin  - Prescribed prednisone  - Advised to get chest x-ray today if possible - If chest x-ray is positive for pneumonia, will prescribe a second antibiotic  Type 2 diabetes mellitus A1c is 5.9, indicating good glycemic control. Steroid use may increase blood sugar levels. - Monitor blood sugar levels, especially with steroid use  Essential hypertension Blood pressure was elevated during the visit. She has a way to check blood  pressure at home. Steroid use may affect blood pressure. - Rechecked blood pressure before leaving the clinic - Monitor blood pressure at home - If blood pressure remains high, will consider further intervention  Hyperlipidemia LDL was 159 and total cholesterol was 242 on the last lipid panel.  Generalized anxiety disorder She is concerned about medication supply issues affecting her anxiety management. Current medication regimen includes buspirone  and hydroxyzine . - Ensured medication refills are available  Obesity Current weight is 161 pounds.  History of breast cancer She is followed by oncology. - Continue follow-up with oncology        Follow up plan: Return in about 3 months (around 08/17/2024) for follow up. "

## 2024-05-20 ENCOUNTER — Other Ambulatory Visit (HOSPITAL_COMMUNITY): Payer: Self-pay

## 2024-05-20 ENCOUNTER — Ambulatory Visit: Admitting: Psychology

## 2024-05-20 DIAGNOSIS — F4322 Adjustment disorder with anxiety: Secondary | ICD-10-CM | POA: Diagnosis not present

## 2024-05-20 DIAGNOSIS — F4321 Adjustment disorder with depressed mood: Secondary | ICD-10-CM

## 2024-05-20 NOTE — Progress Notes (Signed)
 "      [facility] Counselor/Therapist Progress Note  Patient ID: Tracey Huff, MRN: [id number],    Date: 05/20/2024  Time Spent: 2:33pm-3:33pm     Treatment Type: Individual Therapy   pt is seen for in person visit.  Reported Symptoms:  pt reported increased feelings of grief recent.  Pt reports focus on boundaries in relationships w/ friends/coworkers.     Mental Status Exam: Appearance:  Well Groomed     Behavior: Appropriate  Motor: Normal  Speech/Language:  Clear and Coherent  Affect: Appropriate  Mood: sad  Thought process: normal  Thought content:   WNL  Sensory/Perceptual disturbances:   WNL  Orientation: oriented to person, place, time/date, and situation  Attention: Good  Concentration: Good  Memory: WNL  Fund of knowledge:  Good  Insight:   Good  Judgment:  Good  Impulse Control: Good   Risk Assessment: Danger to Self:  No Self-injurious Behavior: No Danger to Others: No Duty to Warn:no Physical Aggression / Violence:No  Access to Firearms a concern: No  Gang Involvement:No   Subjective: Counselor assessed pt current functioning per pt report.  Processed w/ pt recent grief and emotions.  Explored holidays and birthday of deceased husband.  Validated and normalized feelings.  Discussed healthy boundaries she is recognizing in relationships and to focus on caring for self and exploring self.    Pt affect wnl.  Pt reports rough week w/ holidays and husband birthday last week.  Pt reports that friend still hasn't paid her back for cancelled trip.  Pt reports that still not getting clear communication from.  Pt discussed increased awareness of boundaries needing to establish for self w/ friends and coworkers.  Pt reflected hurt experienced in past in her relationship and by husband's family.  Pt discussed steps taking to not focus on that energy and move forward w/ taking care of self.  Pt reports that her asthma is in current flare and on increased  meds.    Interventions: Cognitive Behavioral Therapy, Assertiveness/Communication, and supportive  Diagnosis:Adjustment disorder with anxiety  Grief  Plan: Pt f/u in 2-3 weeks for counseling.  Pt to f/u as scheduled w/ PCP.       Individualized Treatment Plan Strengths: writing her own book about her experience,  moving into home bought, her faith, enjoys shopping, joined a dance team  Supports: police friends, daughters, friends, spiritual mentors    Goal/Needs for Treatment:  In order of importance to patient 1) increase appropriate expression of feelings.   2) cope w/ stressors 3) ---    Client Statement of Needs:  I still need to work towards finding me, I can still question myself and what I am doing and want to be able to accept myself.  Continue to creat safe space to talk.     Treatment Level: outpt counseling  Symptoms: feeling anxious/on edge, grief  Client Treatment Preferences:biweekly counseling. Continue med management w/ PCP    Healthcare consumer's goal for treatment:   Counselor, Damien Herald, [facility] will support the patient's ability to achieve the goals identified. Cognitive Behavioral Therapy, Assertive Communication/Conflict Resolution Training, Relaxation Training, ACT, Humanistic and other evidenced-based practices will be used to promote progress towards healthy functioning.    Healthcare consumer will: Actively participate in therapy, working towards healthy functioning.     *Justification for Continuation/Discontinuation of Goal: R=Revised, O=Ongoing, A=Achieved, D=Discontinued   Goal 1) Increase pt verbal expression of feelings, grief and emotions. Baseline date 09/18/23: Progress towards goal  50; How Often - Daily Target Date Goal Was reviewed Status Code Progress towards goal/Likert rating  09/17/24                            Goal 2) Increase self care and coping skills daily to manage stress to reduce anxiety and irritability AEB Pt report and  therapist observation. Baseline date 09/18/23: Progress towards goal 50; How Often - Daily Target Date Goal Was reviewed Status Code Progress towards goal  09/17/24                            Goal 3) Increase self discovery, awareness of self, values and goals AEB Pt report and therapist observation. Baseline date 09/18/23: Progress towards goal 25; How Often - Daily Target Date Goal Was reviewed Status Code Progress towards goal  09/17/24                            This plan has been reviewed and created by the following participants:  This plan will be reviewed at least every 12 months. Date Behavioral Health Clinician Date Guardian/Patient   09/18/23          [facility] 09/18/23 Verbal Consent Provided and electronic signature requested       10/07/23  Electronic signature completed by pt               [place], [facility] "

## 2024-05-25 ENCOUNTER — Ambulatory Visit

## 2024-05-25 DIAGNOSIS — L501 Idiopathic urticaria: Secondary | ICD-10-CM

## 2024-05-25 NOTE — Progress Notes (Addendum)
 Patient here today for Dupixent  injection for Chronic Idiopathic Urticaria.   Dupixent  Pen 300mg  injected into right upper arm . Patient tolerated injection well.   LOT: [id number] EXP: 10/04/2025   Alan Pizza, RMA

## 2024-05-26 ENCOUNTER — Other Ambulatory Visit (HOSPITAL_COMMUNITY): Payer: Self-pay

## 2024-05-27 ENCOUNTER — Other Ambulatory Visit: Payer: Self-pay | Admitting: Pharmacist

## 2024-05-27 ENCOUNTER — Other Ambulatory Visit: Payer: Self-pay

## 2024-05-27 ENCOUNTER — Ambulatory Visit: Attending: Nurse Practitioner | Admitting: Pharmacist

## 2024-05-27 ENCOUNTER — Encounter (HOSPITAL_COMMUNITY): Payer: Self-pay

## 2024-05-27 DIAGNOSIS — Z7189 Other specified counseling: Secondary | ICD-10-CM

## 2024-05-27 MED ORDER — DUPIXENT 300 MG/2ML ~~LOC~~ SOAJ
300.0000 mg | SUBCUTANEOUS | 2 refills | Status: AC
  Filled 2024-05-27: qty 4, 28d supply, fill #0

## 2024-05-27 NOTE — Progress Notes (Signed)
" ° °  S: Patient presents for review of their specialty medication therapy.  Patient is currently taking Dupixent  for atopic dermatitis. Patient is managed by Dr. Hester for this.   Adherence: confirms  Efficacy: only had her second injection recently  Dosing: 300 mg subcutaneously once every 2 weeks  Dose adjustments: Renal: no dose adjustments (has not been studied) Hepatic: no dose adjustments (has not been studied)  Drug-drug interactions: none  Monitoring: S/sx of infection: none S/sx of hypersensitivity: none S/sx of ocular effects: none S/sx of eosinophilia/vasculitis: none  O:     Lab Results  Component Value Date   WBC 8.7 08/11/2023   HGB 13.2 08/11/2023   HCT 40.4 08/11/2023   MCV 84.2 08/11/2023   PLT 400 08/11/2023      Chemistry      Component Value Date/Time   NA 140 08/11/2023 1001   NA 142 11/13/2022 1056   NA 139 08/24/2014 1751   K 4.3 08/11/2023 1001   K 3.3 (L) 08/24/2014 1751   CL 104 08/11/2023 1001   CL 101 08/24/2014 1751   CO2 26 08/11/2023 1001   CO2 27 08/24/2014 1751   BUN 12 08/11/2023 1001   BUN 10 11/13/2022 1056   BUN 16 08/24/2014 1751   CREATININE 0.84 08/11/2023 1001      Component Value Date/Time   CALCIUM  9.9 08/11/2023 1001   CALCIUM  9.8 08/24/2014 1751   ALKPHOS 56 11/13/2022 1056   ALKPHOS 42 08/24/2014 1751   AST 22 08/11/2023 1001   AST 25 08/24/2014 1751   ALT 29 08/11/2023 1001   ALT 21 08/24/2014 1751   BILITOT 0.3 08/11/2023 1001   BILITOT 0.3 11/13/2022 1056   BILITOT 0.5 08/24/2014 1751       A/P: 1. Medication review: Patient currently on Dupixent  for asthma. Reviewed the medication with the patient, including the following: Dupixent  is a monoclonal antibody used for the treatment of asthma or atopic dermatitis. Patient educated on purpose, proper use and potential adverse effects of Dupixent . Possible adverse effects include increased risk of infection, ocular effects, vasculitis/eosinophilia, and  hypersensitivity reactions. Administer as a SubQ injection and rotate sites. Allow the medication to reach room temp prior to administration (45 mins for 300 mg syringe or 30 min for 200 mg syringe). Do not shake. Discard any unused portion. No recommendations for any changes.  Herlene Fleeta Morris, PharmD, JAQUELINE, CPP Clinical Pharmacist [facility] [phone]              "

## 2024-05-27 NOTE — Progress Notes (Signed)
 See OV from 05/27/24 for complete documentation.   Herlene Fleeta Morris, PharmD, JAQUELINE, CPP Clinical Pharmacist [facility] [phone]

## 2024-05-27 NOTE — Progress Notes (Signed)
 Benefits investigation started in new encounter. Sent to [facility] for employee clinic visit

## 2024-05-27 NOTE — Progress Notes (Signed)
 Specialty Pharmacy Initial Fill Coordination Note  Tracey Huff is a 54 y.o. female contacted today regarding initial fill of specialty medication(s) Dupilumab  (Dupixent )   Patient requested Courier to Provider Office   Delivery date: 06/03/24   Verified address: [facility]   Medication will be filled on: 06/02/24   Patient is aware of $0 copayment. Patient has copay card

## 2024-05-27 NOTE — Progress Notes (Addendum)
 Pharmacy Patient Advocate Encounter  Insurance verification completed.   The patient is insured through [facility]   Ran test claim for Dupixent . Co-pay is $769.  Copay card brings copay to $0  This test claim was processed through [facility]- copay amounts may vary at other pharmacies due to pharmacy/plan contracts, or as the patient moves through the different stages of their insurance plan.

## 2024-05-28 ENCOUNTER — Encounter: Payer: Self-pay | Admitting: Internal Medicine

## 2024-05-28 ENCOUNTER — Other Ambulatory Visit (HOSPITAL_COMMUNITY): Payer: Self-pay

## 2024-05-28 ENCOUNTER — Inpatient Hospital Stay: Attending: Internal Medicine | Admitting: Internal Medicine

## 2024-05-28 ENCOUNTER — Encounter (HOSPITAL_COMMUNITY): Payer: Self-pay

## 2024-05-28 VITALS — BP 151/97 | HR 75 | Temp 98.2°F | Resp 18 | Ht 60.0 in | Wt 163.6 lb

## 2024-05-28 DIAGNOSIS — Z170 Estrogen receptor positive status [ER+]: Secondary | ICD-10-CM | POA: Diagnosis not present

## 2024-05-28 DIAGNOSIS — C50412 Malignant neoplasm of upper-outer quadrant of left female breast: Secondary | ICD-10-CM

## 2024-05-28 MED ORDER — TAMOXIFEN CITRATE 20 MG PO TABS
20.0000 mg | ORAL_TABLET | Freq: Every day | ORAL | 1 refills | Status: AC
  Filled 2024-05-28: qty 90, 90d supply, fill #0

## 2024-05-28 NOTE — Progress Notes (Signed)
 [facility] CONSULT NOTE  Patient Care Team: Gareth Mliss FALCON, FNP as PCP - General (Nurse Practitioner) Dannielle Arlean FALCON, RN (Inactive) as Oncology Nurse Navigator Pabon, Laneta FALCON, MD as Consulting Physician (General Surgery) Rennie Tracey SAUNDERS, MD as Consulting Physician (Oncology) Tracey Craig RAMAN, MD as Consulting Physician (Plastic Surgery)  CHIEF COMPLAINTS/PURPOSE OF CONSULTATION: Breast cancer   Oncology History Overview Note  # On physical exam, I palpate a focal firm 2 x 5 cm masslike area over the 11 to 1:30 position of the left breast approximately 2-4 cm from the nipple.   Targeted ultrasound is performed, showing no discrete focal abnormality over the upper mid to outer left breast. There is a pattern of dense fibroglandular tissue over this area which appears slightly different from the dense fibroglandular tissue elsewhere in the left breast. There is hazy decreased echogenicity with subtle shadowing within this dense tissue at the 12 to 1 o'clock position. Some of this vague hazy decreased echogenicity with shadowing is present at the 12 o'clock position 4 cm from the nipple more focal with harmonics and measures approximately 1.4 x 1.6 x 2 cm. There is another more focal area of decreased echogenicity and shadowing at the 1 o'clock position of the left breast 2 cm from the nipple measuring 1.4 x 1.5 x 1.5 cm. These 2 areas appear to be contiguous and are likely part of the same process.  1.4 x 1.6 x 2 cm. There is another more focal area of decreased echogenicity and shadowing at the 1 o'clock position of the left breast 2 cm from the nipple measuring 1.4 x 1.5 x 1.5 cm. These 2 areas appear to be contiguous and are likely part of the same process.  DIAGNOSIS:  A. 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   Size of invasive carcinoma: 2 mm in this sample  Histologic grade of invasive  carcinoma: Grade 2                       Glandular/tubular differentiation score: 3                       Nuclear pleomorphism score: 2                       Mitotic rate score: 1                       Total score: 6  Ductal carcinoma in situ: Present, intermediate grade  Lymphovascular invasion: Not identified    Ultrasound the left axilla is normal. CASE SUMMARY: BREAST BIOMARKER TESTS  Estrogen Receptor (ER) Status: POSITIVE          Percentage of cells with nuclear positivity: 90-100%          Average intensity of staining: Strong   Progesterone Receptor (PgR) Status: POSITIVE          Percentage of cells with nuclear positivity: 90-100%          Average intensity of staining: Strong   HER2 (by immunohistochemistry): NEGATIVE (Score 1+)  Ki-67: Not performed  Procedure: Simple mastectomy  Specimen Laterality: Left (bilateral mastectomy, tumor in left)   TUMOR  Histologic Type: Invasive mammary carcinoma, no special type  Histologic Grade (Nottingham Histologic Score)  Glandular (Acinar)/Tubular Differentiation: 2                       Nuclear Pleomorphism: 2                       Mitotic Rate: 2                       Overall Grade: 2  Tumor Size: 10 mm  Ductal Carcinoma In Situ (DCIS): Present, with extensive intraductal  component  Lymphovascular Invasion: Not identified  Treatment Effect in the Breast: No known presurgical treatment   MARGINS  Margin Status for Invasive Carcinoma: All margins negative for invasive  carcinoma                       Distance from invasive carcinoma to closest margin:  1.2 cm, posterior   Margin Status for DCIS: Margin(s) Involved by DCIS: All margins negative  for DCIS                       Distance from DCIS to closest margin: 1.2 cm,  posterior   REGIONAL LYMPH NODES  Regional Lymph Node Status: All regional lymph nodes negative for tumor                       Total Number of Lymph Nodes Examined (sentinel  and  non-sentinel): 3                       Number of Sentinel Nodes Examined: 3   DISTANT METASTASIS  Distant Site(s) Involved, if applicable: Not applicable   PATHOLOGIC STAGE CLASSIFICATION (pTNM, AJCC 8th Edition):  TNM Descriptors: M (multifocal)  pT Category: pT1b  Regional Lymph Nodes Modifier: sn  pN Category: pN0  pM Category: Not applicable   SPECIAL STUDIES  Breast Biomarker Testing Performed on Previous Biopsy: [id number]   Estrogen Receptor (ER) Status: POSITIVE  Progesterone Receptor (PgR) Status: POSITIVE  HER2 (by immunohistochemistry): NEGATIVE (Score 1+)   #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  #June 19, 2021-tamoxifen  20 mg a day [ estrogen/LH-?  Postmenopausal].   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 VUS in RAD51D called c.434G> and in [facility] called c.476C>T also identified. The final report date is 05/09/2020.  The CancerNext-Expanded + RNAinsight gene panel offered by [employer] and includes sequencing and rearrangement analysis for the following 77 genes: IP, ALK, APC*, ATM*, AXIN2, BAP1, BARD1, BLM, BMPR1A, BRCA1*, BRCA2*, BRIP1*, CDC73, CDH1*,CDK4, CDKN1B, CDKN2A, CHEK2*, CTNNA1, DICER1, FANCC, FH, FLCN, GALNT12, KIF1B, LZTR1, MAX, MEN1, MET, MLH1*, MSH2*, MSH3, MSH6*, MUTYH*, NBN, NF1*, NF2, NTHL1, PALB2*, PHOX2B, PMS2*, POT1, PRKAR1A, PTCH1, PTEN*, RAD51C*, RAD51D*,RB1, RECQL, RET, SDHA, SDHAF2, SDHB, SDHC, SDHD, SMAD4, SMARCA4, SMARCB1, SMARCE1, STK11, SUFU,  TMEM127, TP53*,TSC1, TSC2, VHL and XRCC2 (sequencing and deletion/duplication); EGFR, EGLN1, HOXB13, KIT, MITF, PDGFRA, POLD1 and POLE (sequencing only); EPCAM and GREM1 (deletion/duplication only).    05/29/2021 Cancer Staging   Staging form:  Breast, AJCC 8th Edition - Pathologic: pT1b, pN0, cM0, ER+, PR+, HER2- - Signed by Rennie Tracey SAUNDERS, MD on 05/29/2021 Nuclear grade: G2    HISTORY OF PRESENTING ILLNESS: Alone.  Ambulating independently.  Tracey Huff 54 y.o.  female patient with stage I ER/PR positive HER2 negative left breast cancer  [CHEK2 mutant] s/p bilateral mastectomies on tamoxifen  is here for follow-up.  Discussed the use of AI scribe software for clinical note transcription with the patient, who gave verbal consent to proceed.  History of Present Illness   Tracey Huff is a 54 year old female with estrogen receptor positive left breast cancer, status post mastectomy and implant placement, who presents for oncology follow-up to address lymphedema, chronic postprocedural pain, and menopausal symptoms.  She continues to experience mild, localized pain at the site of scar tissue in the left breast, which she attributes to nerve regeneration following mastectomy and implant placement. The pain is persistent but not severe, and she prefers to avoid pharmacologic intervention unless symptoms worsen.  Lymphedema of the left upper extremity persists. She previously discussed use of a JoviPak with her lymphedema specialist but has not received the device or further communication from the supply company or specialist.  Menopausal symptoms, specifically hot flashes, persist but have improved with ongoing venlafaxine  therapy. She notes decreased frequency and severity of hot flashes and reports improved emotional stability, particularly following recent personal loss. Her prescription was recently renewed and she plans to continue therapy.  She remains on tamoxifen  and requests a  refill. She denies symptoms suggestive of recurrence or complications, including blood clots or vaginal bleeding. She inquired about the need for mammograms given her breast implants.  She reports a recent episode of asthma exacerbation and bronchitis over the past three weeks, requiring prednisone  and azithromycin , with subsequent improvement in respiratory symptoms and exercise tolerance. She notes mildly elevated blood pressure.      Review of Systems  Constitutional:  Negative for chills, diaphoresis, fever, malaise/fatigue and weight loss.  HENT:  Negative for nosebleeds and sore throat.   Eyes:  Negative for double vision.  Respiratory:  Negative for cough, hemoptysis, sputum production, shortness of breath and wheezing.   Cardiovascular:  Negative for chest pain, palpitations, orthopnea and leg swelling.  Gastrointestinal:  Negative for abdominal pain, blood in stool, constipation, diarrhea, heartburn, melena, nausea and vomiting.  Genitourinary:  Negative for dysuria, frequency and urgency.  Musculoskeletal:  Negative for back pain and joint pain.  Skin: Negative.  Negative for itching and rash.  Neurological:  Negative for dizziness, tingling, focal weakness, weakness and headaches.  Endo/Heme/Allergies:  Does not bruise/bleed easily.  Psychiatric/Behavioral:  Negative for depression. The patient is not nervous/anxious and does not have insomnia.      MEDICAL HISTORY:  Past Medical History:  Diagnosis Date   Allergy    Anxiety    Asthma    Breast cancer (HCC)    INVASIVE MAMMARY CARCINOMA- left   Family history of prostate cancer    Fibroid uterus 11/15/2014   Hyperlipidemia    Hypertension    Menorrhagia with irregular cycle 11/15/2014   Pre-diabetes    Urticaria     SURGICAL HISTORY: Past Surgical History:  Procedure Laterality Date   ABDOMINAL HYSTERECTOMY N/A 11/15/2014   Procedure: HYSTERECTOMY ABDOMINAL/BILATERAL SALPINGECTOMY ;  Surgeon: Garnette JONETTA Mace, MD;   Location: [facility] ORS;  Service: Gynecology;  Laterality: N/A;   BREAST BIOPSY Right    2019 negative   BREAST BIOPSY Left 03/09/2021  u/s biopsy, 12-1 o'clock, VENUS clip-path pending   BREAST LUMPECTOMY,RADIO FREQ LOCALIZER,AXILLARY SENTINEL LYMPH NODE BIOPSY Left 03/28/2021   Procedure: BREAST LUMPECTOMY,RADIO FREQ LOCALIZER,AXILLARY SENTINEL LYMPH NODE BIOPSY;  Surgeon: Jordis Laneta FALCON, MD;  Location: [facility];  Service: General;  Laterality: Left;   BREAST RECONSTRUCTION WITH PLACEMENT OF TISSUE EXPANDER AND FLEX HD (ACELLULAR HYDRATED DERMIS) Bilateral 05/15/2021   Procedure: BILATERAL BREAST RECONSTRUCTION WITH PLACEMENT OF TISSUE EXPANDER AND FLEX HD (ACELLULAR HYDRATED DERMIS);  Surgeon: Tracey Craig RAMAN, MD;  Location: [facility];  Service: Plastics;  Laterality: Bilateral;   CESAREAN SECTION  1990   COLONOSCOPY WITH PROPOFOL  N/A 01/04/2022   Procedure: COLONOSCOPY WITH PROPOFOL ;  Surgeon: Therisa Bi, MD;  Location: [facility];  Service: Gastroenterology;  Laterality: N/A;   COLPOSCOPY  07/28/2018   CYSTOSCOPY N/A 11/15/2014   Procedure: CYSTOSCOPY;  Surgeon: Garnette JONETTA Mace, MD;  Location: [facility];  Service: Gynecology;  Laterality: N/A;   DILATION AND CURETTAGE OF UTERUS     GANGLION CYST EXCISION Left    REMOVAL OF BILATERAL TISSUE EXPANDERS WITH PLACEMENT OF BILATERAL BREAST IMPLANTS Bilateral 10/02/2021   Procedure: REMOVAL OF BILATERAL TISSUE EXPANDERS WITH PLACEMENT OF BILATERAL BREAST IMPLANTS;  Surgeon: Tracey Craig RAMAN, MD;  Location: [facility];  Service: Plastics;  Laterality: Bilateral;   TOTAL MASTECTOMY Bilateral 05/15/2021   Procedure: TOTAL MASTECTOMY, Bilateral Simple Mastectomy, skin sparing;  Surgeon: Jordis Laneta FALCON, MD;  Location: [facility];  Service: General;  Laterality: Bilateral;  Provider requesting for 2 hours / 120 min for procedure.   UTERINE FIBROID SURGERY      SOCIAL HISTORY: Social History   Socioeconomic History   Marital status:  Married    Spouse name: Ubaldo   Number of children: 2   Years of education: Not on file   Highest education level: Not on file  Occupational History   Not on file  Tobacco Use   Smoking status: Never    Passive exposure: Never   Smokeless tobacco: Never  Vaping Use   Vaping status: Never Used  Substance and Sexual Activity   Alcohol use: Not Currently    Comment: occassional   Drug use: No   Sexual activity: Yes    Partners: Male    Birth control/protection: Surgical  Other Topics Concern   Not on file  Social History Narrative   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 Lives with husband.    Social Drivers of Health   Tobacco Use: Low Risk (05/28/2024)   Patient History    Smoking Tobacco Use: Never    Smokeless Tobacco Use: Never    Passive Exposure: Never  Financial Resource Strain: Low Risk (08/09/2022)   Overall Financial Resource Strain (CARDIA)    Difficulty of Paying Living Expenses: Not hard at all  Food Insecurity: No Food Insecurity (08/09/2022)   Hunger Vital Sign    Worried About Running Out of Food in the Last Year: Never true    Ran Out of Food in the Last Year: Never true  Transportation Needs: No Transportation Needs (08/09/2022)   PRAPARE - Administrator, Civil Service (Medical): No    Lack of Transportation (Non-Medical): No  Physical Activity: Sufficiently Active (08/04/2023)   Received from [facility]   Exercise Vital Sign    On average, how many days per week do you engage in moderate to strenuous exercise (like a brisk walk)?: 6 days  On average, how many minutes do you engage in exercise at this level?: 30 min  Stress: No Stress Concern Present (08/09/2022)   [employer] of Occupational Health - Occupational Stress Questionnaire    Feeling of Stress : Only a little  Social Connections: Socially Integrated (08/09/2022)   Social Connection and Isolation Panel    Frequency  of Communication with Friends and Family: More than three times a week    Frequency of Social Gatherings with Friends and Family: More than three times a week    Attends Religious Services: More than 4 times per year    Active Member of [employer] or Organizations: No    Attends Engineer, Structural: More than 4 times per year    Marital Status: Married  Catering Manager Violence: Not At Risk (08/09/2022)   Humiliation, Afraid, Rape, and Kick questionnaire    Fear of Current or Ex-Partner: No    Emotionally Abused: No    Physically Abused: No    Sexually Abused: No  Depression (PHQ2-9): Low Risk (05/28/2024)   Depression (PHQ2-9)    PHQ-2 Score: 0  Alcohol Screen: Low Risk (02/10/2023)   Alcohol Screen    Last Alcohol Screening Score (AUDIT): 0  Housing: Low Risk (08/09/2022)   Housing    Last Housing Risk Score: 0  Utilities: Not At Risk (08/09/2022)   AHC Utilities    Threatened with loss of utilities: No  Health Literacy: Not on file    FAMILY HISTORY: Family History  Problem Relation Age of Onset   Allergic rhinitis Mother    Congestive Heart Failure Mother    Diabetes Mother    Hypertension Mother    Diabetes Maternal Grandmother    Congestive Heart Failure Maternal Grandfather    Breast cancer Neg Hx     ALLERGIES:  is allergic to macadamia nut oil, apple juice, brazil nut (berthollefia excelsa), carrot oil, fruit & vegetable daily [nutritional supplements], kiwi extract, other, and peach [prunus persica].  MEDICATIONS:  Current Outpatient Medications  Medication Sig Dispense Refill   albuterol  (VENTOLIN  HFA) 108 (90 Base) MCG/ACT inhaler Inhale 1-2 puffs into the lungs every 4 (four) to 6 (six) hours as needed for cough/wheeze. 6.7 g 0   ALPRAZolam  (XANAX ) 0.25 MG tablet Take 1 tablet (0.25 mg total) by mouth 2 (two) times daily as needed for anxiety (severe anxiety). 20 tablet 0   Ascorbic Acid (VITAMIN C) 100 MG tablet Take 100 mg by mouth daily.     atorvastatin   (LIPITOR) 10 MG tablet Take 1 tablet (10 mg total) by mouth daily at bedtime. 90 tablet 1   azelastine  (ASTELIN ) 0.1 % nasal spray Place 1-2 sprays into both nostrils 2 (two) times daily. 30 mL 6   benzonatate  (TESSALON ) 100 MG capsule Take 2 capsules (200 mg total) by mouth 2 (two) times daily as needed for cough. 20 capsule 0   busPIRone  (BUSPAR ) 10 MG tablet Take 1 tablet (10 mg total) by mouth 2 (two) times daily as needed (anxiety). 180 tablet 2   diphenhydrAMINE  (BENADRYL ) 2 % cream Apply 1 application topically 2 (two) times daily as needed for itching.     Docosanol  10 % CREA Apply 1 Application topically every 4 (four) hours. 2 g 0   Dupilumab  (DUPIXENT ) 300 MG/2ML SOAJ Inject 300 mg into the skin every 14 (fourteen) days. Starting at day 15 for maintenance. 4 mL 2   ELDERBERRY PO Take 1 capsule by mouth daily.     EPINEPHrine  0.3  mg/0.3 mL IJ SOAJ injection Inject 0.3 mg into the muscle as needed, as directed. 2 each 1   ezetimibe  (ZETIA ) 10 MG tablet Take 1 tablet (10 mg total) by mouth daily. 90 tablet 3   fluticasone  (FLONASE ) 50 MCG/ACT nasal spray Place 1-2 sprays into both nostrils once daily. 16 g 6   Fluticasone -Umeclidin-Vilant (TRELEGY ELLIPTA ) 200-62.5-25 MCG/ACT AEPB Inhale 1 puff into the lungs daily. 60 each 6   hydrocortisone cream 1 % Apply 1 application topically daily as needed for itching.     hydrOXYzine  (VISTARIL ) 25 MG capsule TAKE 1 CAPSULE (25 MG TOTAL) BY MOUTH AT BEDTIME AS NEEDED FOR UP TO 30 DOSES (DIFFICULTY SLEEPING). 30 capsule 1   ipratropium-albuterol  (DUONEB) 0.5-2.5 (3) MG/3ML SOLN Take 3 mLs by nebulization every 6 (six) hours as needed (wheeze, shortness of breath, cough variant asthma). 360 mL 1   levocetirizine (XYZAL ) 5 MG tablet Take 1 tablet (5 mg total) by mouth every evening. 30 tablet 6   losartan  (COZAAR ) 25 MG tablet Take 1 tablet (25 mg total) by mouth every morning. 90 tablet 1   meloxicam  (MOBIC ) 7.5 MG tablet Take 1 tablet (7.5 mg total)  by mouth daily. Start after completing prednisone . 30 tablet 1   montelukast  (SINGULAIR ) 10 MG tablet Take 1 tablet (10 mg total) by mouth every evening. 30 tablet 6   mupirocin  ointment (BACTROBAN ) 2 % Apply 1 Application topically 2 (two) times daily. 22 g 0   Olopatadine  HCl (PATADAY ) 0.2 % SOLN Instill 1 drop into affected eye once daily 2.5 mL 6   promethazine -dextromethorphan (PROMETHAZINE -DM) 6.25-15 MG/5ML syrup Take 5 mLs by mouth 4 (four) times daily as needed for cough. 118 mL 0   Ruxolitinib  Phosphate (OPZELURA ) 1.5 % CREA Apply 1 application topically as directed 1- 2  times daily. 60 g 11   Semaglutide ,0.25 or 0.5MG /DOS, (OZEMPIC , 0.25 OR 0.5 MG/DOSE,) 2 MG/3ML SOPN Inject 0.5 mg into the skin once a week. 9 mL 1   triamcinolone  cream (KENALOG ) 0.1 % Apply 1 Application topically 2 (two) times daily. 30 g 0   venlafaxine  XR (EFFEXOR  XR) 150 MG 24 hr capsule Take 1 capsule (150 mg total) by mouth daily with breakfast. 90 capsule 1   zolpidem  (AMBIEN ) 5 MG tablet Take 1 tablet (5 mg total) by mouth at bedtime as needed for sleep. 90 tablet 1   tamoxifen  (NOLVADEX ) 20 MG tablet Take 1 tablet (20 mg total) by mouth daily. 90 tablet 1   No current facility-administered medications for this visit.     PHYSICAL EXAMINATION: ECOG PERFORMANCE STATUS: 0 - Asymptomatic  Vitals:   05/28/24 1031 05/28/24 1041  BP: (!) 142/88 (!) 151/97  Pulse: 75   Resp: 18   Temp: 98.2 F (36.8 C)   SpO2: 100%    Filed Weights   05/28/24 1031  Weight: 163 lb 9.6 oz (74.2 kg)   Physical Exam Vitals and nursing note reviewed.  HENT:     Head: Normocephalic and atraumatic.     Mouth/Throat:     Pharynx: Oropharynx is clear.  Eyes:     Extraocular Movements: Extraocular movements intact.     Pupils: Pupils are equal, round, and reactive to light.  Cardiovascular:     Rate and Rhythm: Normal rate and regular rhythm.  Pulmonary:     Comments: Decreased breath sounds bilaterally.  Abdominal:      Palpations: Abdomen is soft.  Musculoskeletal:        General: Normal range  of motion.     Cervical back: Normal range of motion.  Skin:    General: Skin is warm.  Neurological:     General: No focal deficit present.     Mental Status: She is alert and oriented to person, place, and time.  Psychiatric:        Behavior: Behavior normal.        Judgment: Judgment normal.    Mild lymphedema of left chest wall/Left Upper UE-   LABORATORY DATA:  I have reviewed the data as listed Lab Results  Component Value Date   WBC 8.7 08/11/2023   HGB 13.2 08/11/2023   HCT 40.4 08/11/2023   MCV 84.2 08/11/2023   PLT 400 08/11/2023   Recent Labs    08/11/23 1001  NA 140  K 4.3  CL 104  CO2 26  GLUCOSE 93  BUN 12  CREATININE 0.84  CALCIUM  9.9  PROT 7.7  AST 22  ALT 29  BILITOT 0.3    RADIOGRAPHIC STUDIES: I have personally reviewed the radiological images as listed and agreed with the findings in the report. DG Chest 2 View Result Date: 05/19/2024 EXAM: 2 VIEW(S) XRAY OF THE CHEST 05/19/2024 11:37:53 AM COMPARISON: 11/28/2020. CLINICAL HISTORY: The patient reports cough and shortness of breath. FINDINGS: LUNGS AND PLEURA: No focal pulmonary opacity. No pleural effusion. No pneumothorax. HEART AND MEDIASTINUM: No acute abnormality of the cardiac and mediastinal silhouettes. BONES AND SOFT TISSUES: Surgical clips in left axilla. No acute osseous abnormality. IMPRESSION: 1. No acute process. Electronically signed by: Waddell Calk MD 05/19/2024 12:16 PM EST RP Workstation: [id number]   DG Knee 4 Views W/Patella Right Result Date: 05/16/2024 CLINICAL DATA:  Acute pain of right knee. EXAM: RIGHT KNEE - COMPLETE 4+ VIEW COMPARISON:  None Available. FINDINGS: No acute fracture. Normal alignment. No erosions or bone destruction. Undulation of the medial tibiofemoral compartment. Trace peripheral spurring in the patellofemoral compartment. No significant joint effusion. Unremarkable soft  tissues. IMPRESSION: 1. No acute findings. 2. Mild osteoarthritis. Electronically Signed   By: Andrea Gasman M.D.   On: 05/16/2024 16:18    ASSESSMENT & PLAN:   Carcinoma of upper-outer quadrant of left breast in female, estrogen receptor positive (HCC) #Left breast cancer T62mN0- G-2; ER/PR positive HER2 negative status BIL postmastectomy [positive for CHEK-2 mutation].  Oncotype recurrence score 16/LOW. On Tamoxifen  [s/p hysterectomy [intact ovaries]; no role for mammograms.  Stable.    #Patient tolerated tamoxifen  [until spring 2028]  fairly well except for hot flashes. Refilled.   # Left chest wall- lymphedema/ also affecting left carpal tunnel- s/p evaluation with [facility], discussed with [facility]and re: [email or web address] off script in summer 2025- if not received pt will call us  back.   # Hot flashes-grade  2-3-recommend compliance with Effexor -  # Carpal tunnel- Left UE [[facility]]- also worse re: lymphedema- awaiting NCS-  stable.   # Cancer screening: colonoscopy [sep [email or web address] ]  # DISPOSITION:  # follow up 6 months- MD; no labs- Dr.B   All questions were answered. The patient/family knows to call the clinic with any problems, questions or concerns.     Tracey JONELLE Joe, MD 05/28/2024 10:59 AM

## 2024-05-28 NOTE — Assessment & Plan Note (Addendum)
#  Left breast cancer T74mN0- G-2; ER/PR positive HER2 negative status BIL postmastectomy [positive for CHEK-2 mutation].  Oncotype recurrence score 16/LOW. On Tamoxifen  [s/p hysterectomy [intact ovaries]; no role for mammograms.  Stable.    #Patient tolerated tamoxifen  [until spring 2028]  fairly well except for hot flashes. Refilled.   # Left chest wall- lymphedema/ also affecting left carpal tunnel- s/p evaluation with [facility], discussed with Deland re: [email or web address] off script in summer 2025- if not received pt will call us  back.   # Hot flashes-grade  2-3-recommend compliance with Effexor -  # Carpal tunnel- Left UE [[facility]]- also worse re: lymphedema- awaiting NCS-  stable.   # Cancer screening: colonoscopy [sep [email or web address] ]  # DISPOSITION:  # follow up 6 months- MD; no labs- Dr.B

## 2024-05-28 NOTE — Progress Notes (Signed)
 Concerned with sensitivity on her scar lt breast.

## 2024-05-31 ENCOUNTER — Other Ambulatory Visit (HOSPITAL_COMMUNITY): Payer: Self-pay

## 2024-06-01 ENCOUNTER — Other Ambulatory Visit (HOSPITAL_COMMUNITY): Payer: Self-pay

## 2024-06-01 ENCOUNTER — Other Ambulatory Visit: Payer: Self-pay

## 2024-06-02 ENCOUNTER — Other Ambulatory Visit: Payer: Self-pay

## 2024-06-07 ENCOUNTER — Ambulatory Visit: Admitting: Psychology

## 2024-06-07 DIAGNOSIS — F4321 Adjustment disorder with depressed mood: Secondary | ICD-10-CM

## 2024-06-07 DIAGNOSIS — F4323 Adjustment disorder with mixed anxiety and depressed mood: Secondary | ICD-10-CM

## 2024-06-07 DIAGNOSIS — F4322 Adjustment disorder with anxiety: Secondary | ICD-10-CM
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
## 2024-06-08 ENCOUNTER — Ambulatory Visit

## 2024-06-28 ENCOUNTER — Ambulatory Visit

## 2024-07-01 ENCOUNTER — Ambulatory Visit: Admitting: Psychology

## 2024-07-15 ENCOUNTER — Ambulatory Visit: Admitting: Dermatology

## 2024-10-06 ENCOUNTER — Encounter: Admitting: Nurse Practitioner

## 2024-11-26 ENCOUNTER — Inpatient Hospital Stay: Admitting: Internal Medicine

## 2025-03-17 ENCOUNTER — Ambulatory Visit: Admitting: Dermatology
